# Patient Record
Sex: Male | Born: 1940 | Race: White | Hispanic: No | Marital: Married | Smoking: Former smoker
Health system: Southern US, Community
[De-identification: ages and names within clinical notes are randomized; demographics above are authoritative.]

## PROBLEM LIST (undated history)

## (undated) DIAGNOSIS — M62830 Muscle spasm of back: Secondary | ICD-10-CM

## (undated) DIAGNOSIS — Z942 Lung transplant status: Secondary | ICD-10-CM

## (undated) DIAGNOSIS — N186 End stage renal disease: Secondary | ICD-10-CM

## (undated) DIAGNOSIS — K5792 Diverticulitis of intestine, part unspecified, without perforation or abscess without bleeding: Secondary | ICD-10-CM

## (undated) DIAGNOSIS — C44300 Unspecified malignant neoplasm of skin of unspecified part of face: Secondary | ICD-10-CM

## (undated) DIAGNOSIS — I10 Essential (primary) hypertension: Secondary | ICD-10-CM

## (undated) DIAGNOSIS — N189 Chronic kidney disease, unspecified: Secondary | ICD-10-CM

## (undated) DIAGNOSIS — E119 Type 2 diabetes mellitus without complications: Secondary | ICD-10-CM

## (undated) DIAGNOSIS — Z992 Dependence on renal dialysis: Secondary | ICD-10-CM

## (undated) HISTORY — PX: APPENDECTOMY: SHX54

## (undated) HISTORY — PX: CATARACT EXTRACTION: SUR2

---

## 1999-12-06 DIAGNOSIS — Z89421 Acquired absence of other right toe(s): Secondary | ICD-10-CM | POA: Insufficient documentation

## 1999-12-06 DIAGNOSIS — J449 Chronic obstructive pulmonary disease, unspecified: Secondary | ICD-10-CM | POA: Insufficient documentation

## 1999-12-06 DIAGNOSIS — I2510 Atherosclerotic heart disease of native coronary artery without angina pectoris: Secondary | ICD-10-CM | POA: Insufficient documentation

## 1999-12-06 DIAGNOSIS — I5030 Unspecified diastolic (congestive) heart failure: Secondary | ICD-10-CM | POA: Insufficient documentation

## 1999-12-06 DIAGNOSIS — Z992 Dependence on renal dialysis: Secondary | ICD-10-CM | POA: Insufficient documentation

## 1999-12-06 DIAGNOSIS — E1151 Type 2 diabetes mellitus with diabetic peripheral angiopathy without gangrene: Secondary | ICD-10-CM | POA: Insufficient documentation

## 2001-04-16 ENCOUNTER — Ambulatory Visit: Admission: RE | Admit: 2001-04-16 | Discharge: 2001-04-16 | Payer: Self-pay | Admitting: Family Medicine

## 2001-06-15 ENCOUNTER — Ambulatory Visit: Admission: RE | Admit: 2001-06-15 | Discharge: 2001-06-15 | Payer: Self-pay | Admitting: Pulmonary Disease

## 2001-06-24 ENCOUNTER — Emergency Department (HOSPITAL_COMMUNITY): Admission: EM | Admit: 2001-06-24 | Discharge: 2001-06-24 | Payer: Self-pay | Admitting: *Deleted

## 2001-06-24 ENCOUNTER — Encounter: Payer: Self-pay | Admitting: Emergency Medicine

## 2001-10-04 ENCOUNTER — Encounter (HOSPITAL_COMMUNITY): Admission: RE | Admit: 2001-10-04 | Discharge: 2001-12-04 | Payer: Self-pay | Admitting: Pulmonary Disease

## 2001-12-05 ENCOUNTER — Encounter (HOSPITAL_COMMUNITY): Admission: RE | Admit: 2001-12-05 | Discharge: 2002-03-05 | Payer: Self-pay | Admitting: Pulmonary Disease

## 2002-03-06 ENCOUNTER — Encounter (HOSPITAL_COMMUNITY): Admission: RE | Admit: 2002-03-06 | Discharge: 2002-06-04 | Payer: Self-pay | Admitting: Pulmonary Disease

## 2002-06-05 ENCOUNTER — Encounter (HOSPITAL_COMMUNITY): Admission: RE | Admit: 2002-06-05 | Discharge: 2002-09-03 | Payer: Self-pay | Admitting: Pulmonary Disease

## 2002-09-04 ENCOUNTER — Encounter (HOSPITAL_COMMUNITY): Admission: RE | Admit: 2002-09-04 | Discharge: 2002-12-03 | Payer: Self-pay | Admitting: Pulmonary Disease

## 2002-12-05 ENCOUNTER — Encounter (HOSPITAL_COMMUNITY): Admission: RE | Admit: 2002-12-05 | Discharge: 2003-03-05 | Payer: Self-pay | Admitting: Pulmonary Disease

## 2002-12-05 DIAGNOSIS — Z942 Lung transplant status: Secondary | ICD-10-CM

## 2002-12-05 HISTORY — DX: Lung transplant status: Z94.2

## 2002-12-05 HISTORY — PX: LUNG TRANSPLANT, DOUBLE: SHX704

## 2003-03-06 ENCOUNTER — Encounter (HOSPITAL_COMMUNITY): Admission: RE | Admit: 2003-03-06 | Discharge: 2003-06-04 | Payer: Self-pay | Admitting: Pulmonary Disease

## 2003-08-06 ENCOUNTER — Encounter (HOSPITAL_COMMUNITY): Admission: RE | Admit: 2003-08-06 | Discharge: 2003-11-04 | Payer: Self-pay | Admitting: Pulmonary Disease

## 2003-11-05 ENCOUNTER — Encounter (HOSPITAL_COMMUNITY): Admission: RE | Admit: 2003-11-05 | Discharge: 2004-02-03 | Payer: Self-pay | Admitting: Pulmonary Disease

## 2004-03-09 ENCOUNTER — Encounter (HOSPITAL_COMMUNITY): Admission: RE | Admit: 2004-03-09 | Discharge: 2004-06-07 | Payer: Self-pay | Admitting: Pulmonary Disease

## 2004-10-07 ENCOUNTER — Ambulatory Visit: Payer: Self-pay | Admitting: Pulmonary Disease

## 2004-11-16 ENCOUNTER — Ambulatory Visit: Payer: Self-pay | Admitting: Pulmonary Disease

## 2004-12-21 ENCOUNTER — Ambulatory Visit: Payer: Self-pay | Admitting: Pulmonary Disease

## 2005-01-05 ENCOUNTER — Ambulatory Visit: Payer: Self-pay | Admitting: Pulmonary Disease

## 2005-01-24 ENCOUNTER — Ambulatory Visit: Payer: Self-pay | Admitting: Pulmonary Disease

## 2005-02-01 ENCOUNTER — Ambulatory Visit: Payer: Self-pay | Admitting: Internal Medicine

## 2005-02-22 ENCOUNTER — Ambulatory Visit: Payer: Self-pay | Admitting: Internal Medicine

## 2005-02-28 ENCOUNTER — Ambulatory Visit: Payer: Self-pay | Admitting: Pulmonary Disease

## 2005-04-04 ENCOUNTER — Ambulatory Visit: Payer: Self-pay | Admitting: Pulmonary Disease

## 2005-05-06 ENCOUNTER — Ambulatory Visit: Payer: Self-pay | Admitting: Pulmonary Disease

## 2005-05-12 ENCOUNTER — Encounter (HOSPITAL_COMMUNITY): Admission: RE | Admit: 2005-05-12 | Discharge: 2005-08-10 | Payer: Self-pay | Admitting: Pulmonary Disease

## 2005-06-06 ENCOUNTER — Ambulatory Visit: Payer: Self-pay | Admitting: Pulmonary Disease

## 2005-07-25 ENCOUNTER — Ambulatory Visit: Payer: Self-pay | Admitting: Pulmonary Disease

## 2005-07-26 ENCOUNTER — Ambulatory Visit: Payer: Self-pay | Admitting: Family Medicine

## 2005-08-22 ENCOUNTER — Ambulatory Visit: Payer: Self-pay | Admitting: Pulmonary Disease

## 2005-08-29 ENCOUNTER — Ambulatory Visit: Payer: Self-pay | Admitting: Pulmonary Disease

## 2005-10-03 ENCOUNTER — Ambulatory Visit: Payer: Self-pay | Admitting: Pulmonary Disease

## 2005-10-19 ENCOUNTER — Ambulatory Visit: Payer: Self-pay | Admitting: Pulmonary Disease

## 2006-01-23 ENCOUNTER — Ambulatory Visit: Payer: Self-pay | Admitting: Pulmonary Disease

## 2006-01-31 ENCOUNTER — Ambulatory Visit: Payer: Self-pay | Admitting: Pulmonary Disease

## 2006-03-27 ENCOUNTER — Ambulatory Visit: Payer: Self-pay | Admitting: Pulmonary Disease

## 2006-05-31 ENCOUNTER — Ambulatory Visit: Payer: Self-pay | Admitting: Pulmonary Disease

## 2006-06-14 ENCOUNTER — Ambulatory Visit: Payer: Self-pay | Admitting: Pulmonary Disease

## 2006-09-05 ENCOUNTER — Ambulatory Visit: Payer: Self-pay | Admitting: Pulmonary Disease

## 2006-09-12 ENCOUNTER — Ambulatory Visit: Payer: Self-pay | Admitting: Pulmonary Disease

## 2006-09-26 ENCOUNTER — Ambulatory Visit: Payer: Self-pay | Admitting: Pulmonary Disease

## 2006-10-31 ENCOUNTER — Ambulatory Visit: Payer: Self-pay | Admitting: Pulmonary Disease

## 2007-01-09 ENCOUNTER — Ambulatory Visit: Payer: Self-pay | Admitting: Pulmonary Disease

## 2007-02-19 ENCOUNTER — Ambulatory Visit: Payer: Self-pay | Admitting: Pulmonary Disease

## 2007-02-26 ENCOUNTER — Ambulatory Visit: Payer: Self-pay | Admitting: Pulmonary Disease

## 2007-03-12 ENCOUNTER — Ambulatory Visit: Payer: Self-pay | Admitting: Pulmonary Disease

## 2007-03-26 ENCOUNTER — Ambulatory Visit: Payer: Self-pay | Admitting: Pulmonary Disease

## 2007-04-17 ENCOUNTER — Ambulatory Visit: Payer: Self-pay | Admitting: Pulmonary Disease

## 2007-05-28 ENCOUNTER — Ambulatory Visit: Payer: Self-pay | Admitting: Pulmonary Disease

## 2007-07-24 ENCOUNTER — Ambulatory Visit: Payer: Self-pay | Admitting: Pulmonary Disease

## 2007-08-07 ENCOUNTER — Ambulatory Visit: Payer: Self-pay | Admitting: Pulmonary Disease

## 2007-09-24 ENCOUNTER — Ambulatory Visit: Payer: Self-pay | Admitting: Pulmonary Disease

## 2007-10-22 ENCOUNTER — Telehealth (INDEPENDENT_AMBULATORY_CARE_PROVIDER_SITE_OTHER): Payer: Self-pay | Admitting: *Deleted

## 2007-11-12 ENCOUNTER — Ambulatory Visit: Payer: Self-pay | Admitting: Pulmonary Disease

## 2007-12-10 ENCOUNTER — Ambulatory Visit: Payer: Self-pay | Admitting: Pulmonary Disease

## 2007-12-10 DIAGNOSIS — T86819 Unspecified complication of lung transplant: Secondary | ICD-10-CM | POA: Insufficient documentation

## 2008-01-04 DIAGNOSIS — D899 Disorder involving the immune mechanism, unspecified: Secondary | ICD-10-CM | POA: Insufficient documentation

## 2008-01-04 DIAGNOSIS — R0989 Other specified symptoms and signs involving the circulatory and respiratory systems: Secondary | ICD-10-CM

## 2008-01-04 DIAGNOSIS — E119 Type 2 diabetes mellitus without complications: Secondary | ICD-10-CM | POA: Insufficient documentation

## 2008-01-04 DIAGNOSIS — E785 Hyperlipidemia, unspecified: Secondary | ICD-10-CM | POA: Insufficient documentation

## 2008-01-04 DIAGNOSIS — J479 Bronchiectasis, uncomplicated: Secondary | ICD-10-CM | POA: Insufficient documentation

## 2008-01-04 DIAGNOSIS — R0609 Other forms of dyspnea: Secondary | ICD-10-CM | POA: Insufficient documentation

## 2008-01-04 DIAGNOSIS — E1142 Type 2 diabetes mellitus with diabetic polyneuropathy: Secondary | ICD-10-CM | POA: Insufficient documentation

## 2008-02-11 ENCOUNTER — Ambulatory Visit: Payer: Self-pay | Admitting: Pulmonary Disease

## 2008-03-11 ENCOUNTER — Ambulatory Visit: Payer: Self-pay | Admitting: Pulmonary Disease

## 2008-04-21 ENCOUNTER — Ambulatory Visit: Payer: Self-pay | Admitting: Pulmonary Disease

## 2008-04-21 ENCOUNTER — Telehealth (INDEPENDENT_AMBULATORY_CARE_PROVIDER_SITE_OTHER): Payer: Self-pay | Admitting: *Deleted

## 2008-07-07 ENCOUNTER — Ambulatory Visit: Payer: Self-pay | Admitting: Pulmonary Disease

## 2008-08-12 ENCOUNTER — Ambulatory Visit: Payer: Self-pay | Admitting: Pulmonary Disease

## 2008-11-04 ENCOUNTER — Encounter (INDEPENDENT_AMBULATORY_CARE_PROVIDER_SITE_OTHER): Payer: Self-pay | Admitting: *Deleted

## 2008-11-04 ENCOUNTER — Ambulatory Visit: Payer: Self-pay | Admitting: Pulmonary Disease

## 2008-12-08 ENCOUNTER — Ambulatory Visit: Payer: Self-pay | Admitting: Pulmonary Disease

## 2008-12-08 ENCOUNTER — Encounter (INDEPENDENT_AMBULATORY_CARE_PROVIDER_SITE_OTHER): Payer: Self-pay | Admitting: *Deleted

## 2009-02-02 ENCOUNTER — Telehealth (INDEPENDENT_AMBULATORY_CARE_PROVIDER_SITE_OTHER): Payer: Self-pay | Admitting: *Deleted

## 2009-02-02 ENCOUNTER — Ambulatory Visit: Payer: Self-pay | Admitting: Pulmonary Disease

## 2009-02-02 ENCOUNTER — Encounter (INDEPENDENT_AMBULATORY_CARE_PROVIDER_SITE_OTHER): Payer: Self-pay | Admitting: *Deleted

## 2009-02-03 ENCOUNTER — Ambulatory Visit (HOSPITAL_COMMUNITY): Admission: RE | Admit: 2009-02-03 | Discharge: 2009-02-03 | Payer: Self-pay | Admitting: Pulmonary Disease

## 2009-03-02 ENCOUNTER — Encounter (INDEPENDENT_AMBULATORY_CARE_PROVIDER_SITE_OTHER): Payer: Self-pay | Admitting: *Deleted

## 2009-03-02 ENCOUNTER — Ambulatory Visit: Payer: Self-pay | Admitting: Pulmonary Disease

## 2009-04-07 ENCOUNTER — Ambulatory Visit: Payer: Self-pay | Admitting: Pulmonary Disease

## 2009-04-07 ENCOUNTER — Encounter (INDEPENDENT_AMBULATORY_CARE_PROVIDER_SITE_OTHER): Payer: Self-pay | Admitting: *Deleted

## 2009-04-23 LAB — CONVERTED CEMR LAB
BUN: 18 mg/dL (ref 6–23)
BUN: 18 mg/dL (ref 6–23)
BUN: 19 mg/dL (ref 6–23)
BUN: 20 mg/dL (ref 6–23)
BUN: 21 mg/dL (ref 6–23)
BUN: 21 mg/dL (ref 6–23)
BUN: 23 mg/dL (ref 6–23)
BUN: 23 mg/dL (ref 6–23)
BUN: 23 mg/dL (ref 6–23)
BUN: 23 mg/dL (ref 6–23)
BUN: 24 mg/dL — ABNORMAL HIGH (ref 6–23)
BUN: 25 mg/dL — ABNORMAL HIGH (ref 6–23)
BUN: 26 mg/dL — ABNORMAL HIGH (ref 6–23)
BUN: 27 mg/dL — ABNORMAL HIGH (ref 6–23)
BUN: 24 mg/dL — ABNORMAL HIGH (ref 6–23)
Basophils Absolute: 0 10*3/uL (ref 0.0–0.1)
Basophils Absolute: 0 10*3/uL (ref 0.0–0.1)
Basophils Absolute: 0 10*3/uL (ref 0.0–0.1)
Basophils Absolute: 0 10*3/uL (ref 0.0–0.1)
Basophils Absolute: 0 10*3/uL (ref 0.0–0.1)
Basophils Absolute: 0 10*3/uL (ref 0.0–0.1)
Basophils Absolute: 0 10*3/uL (ref 0.0–0.1)
Basophils Absolute: 0 10*3/uL (ref 0.0–0.1)
Basophils Absolute: 0 10*3/uL (ref 0.0–0.1)
Basophils Absolute: 0.1 10*3/uL (ref 0.0–0.1)
Basophils Relative: 0.2 % (ref 0.0–1.0)
Basophils Relative: 0.3 % (ref 0.0–1.0)
Basophils Relative: 0.4 % (ref 0.0–1.0)
Basophils Relative: 0.5 % (ref 0.0–1.0)
Basophils Relative: 0.5 % (ref 0.0–1.0)
Basophils Relative: 0.5 % (ref 0.0–1.0)
Basophils Relative: 0.7 % (ref 0.0–1.0)
Basophils Relative: 0.8 % (ref 0.0–3.0)
Basophils Relative: 0.9 % (ref 0.0–1.0)
Basophils Relative: 1 % (ref 0.0–1.0)
Basophils Relative: 1.2 % (ref 0.0–3.0)
CO2: 22 meq/L (ref 19–32)
CO2: 22 meq/L (ref 19–32)
CO2: 22 meq/L (ref 19–32)
CO2: 24 meq/L (ref 19–32)
CO2: 24 meq/L (ref 19–32)
CO2: 24 meq/L (ref 19–32)
CO2: 24 meq/L (ref 19–32)
CO2: 24 meq/L (ref 19–32)
CO2: 25 meq/L (ref 19–32)
CO2: 26 meq/L (ref 19–32)
CO2: 26 meq/L (ref 19–32)
CO2: 26 meq/L (ref 19–32)
CO2: 27 meq/L (ref 19–32)
CO2: 27 meq/L (ref 19–32)
CO2: 27 meq/L (ref 19–32)
Calcium: 9 mg/dL (ref 8.4–10.5)
Calcium: 9.1 mg/dL (ref 8.4–10.5)
Calcium: 9.2 mg/dL (ref 8.4–10.5)
Calcium: 9.2 mg/dL (ref 8.4–10.5)
Calcium: 9.2 mg/dL (ref 8.4–10.5)
Calcium: 9.2 mg/dL (ref 8.4–10.5)
Calcium: 9.3 mg/dL (ref 8.4–10.5)
Calcium: 9.3 mg/dL (ref 8.4–10.5)
Calcium: 9.3 mg/dL (ref 8.4–10.5)
Calcium: 9.4 mg/dL (ref 8.4–10.5)
Calcium: 9.5 mg/dL (ref 8.4–10.5)
Calcium: 9.5 mg/dL (ref 8.4–10.5)
Calcium: 9.6 mg/dL (ref 8.4–10.5)
Calcium: 9.7 mg/dL (ref 8.4–10.5)
Chloride: 108 meq/L (ref 96–112)
Chloride: 108 meq/L (ref 96–112)
Chloride: 109 meq/L (ref 96–112)
Chloride: 109 meq/L (ref 96–112)
Chloride: 109 meq/L (ref 96–112)
Chloride: 110 meq/L (ref 96–112)
Chloride: 111 meq/L (ref 96–112)
Chloride: 111 meq/L (ref 96–112)
Chloride: 112 meq/L (ref 96–112)
Chloride: 113 meq/L — ABNORMAL HIGH (ref 96–112)
Chloride: 114 meq/L — ABNORMAL HIGH (ref 96–112)
Creatinine, Ser: 1.3 mg/dL (ref 0.4–1.5)
Creatinine, Ser: 1.3 mg/dL (ref 0.4–1.5)
Creatinine, Ser: 1.4 mg/dL (ref 0.4–1.5)
Creatinine, Ser: 1.4 mg/dL (ref 0.4–1.5)
Creatinine, Ser: 1.4 mg/dL (ref 0.4–1.5)
Creatinine, Ser: 1.4 mg/dL (ref 0.4–1.5)
Creatinine, Ser: 1.4 mg/dL (ref 0.4–1.5)
Creatinine, Ser: 1.5 mg/dL (ref 0.4–1.5)
Creatinine, Ser: 1.5 mg/dL (ref 0.4–1.5)
Creatinine, Ser: 1.5 mg/dL (ref 0.4–1.5)
Creatinine, Ser: 1.5 mg/dL (ref 0.4–1.5)
Creatinine, Ser: 1.5 mg/dL (ref 0.4–1.5)
Eosinophils Absolute: 0.1 10*3/uL (ref 0.0–0.6)
Eosinophils Absolute: 0.1 10*3/uL (ref 0.0–0.6)
Eosinophils Absolute: 0.1 10*3/uL (ref 0.0–0.6)
Eosinophils Absolute: 0.1 10*3/uL (ref 0.0–0.6)
Eosinophils Absolute: 0.1 10*3/uL (ref 0.0–0.6)
Eosinophils Absolute: 0.1 10*3/uL (ref 0.0–0.6)
Eosinophils Absolute: 0.1 10*3/uL (ref 0.0–0.6)
Eosinophils Absolute: 0.1 10*3/uL (ref 0.0–0.7)
Eosinophils Absolute: 0.1 10*3/uL (ref 0.0–0.7)
Eosinophils Absolute: 0.1 10*3/uL (ref 0.0–0.7)
Eosinophils Absolute: 0.2 10*3/uL (ref 0.0–0.6)
Eosinophils Absolute: 0.2 10*3/uL (ref 0.0–0.6)
Eosinophils Absolute: 0.1 10*3/uL (ref 0.0–0.7)
Eosinophils Relative: 0.7 % (ref 0.0–5.0)
Eosinophils Relative: 2.6 % (ref 0.0–5.0)
Eosinophils Relative: 3.6 % (ref 0.0–5.0)
Eosinophils Relative: 3.7 % (ref 0.0–5.0)
Eosinophils Relative: 3.9 % (ref 0.0–5.0)
Eosinophils Relative: 4 % (ref 0.0–5.0)
Eosinophils Relative: 4.3 % (ref 0.0–5.0)
Eosinophils Relative: 4.3 % (ref 0.0–5.0)
Eosinophils Relative: 4.8 % (ref 0.0–5.0)
Eosinophils Relative: 4.9 % (ref 0.0–5.0)
Eosinophils Relative: 5 % (ref 0.0–5.0)
Eosinophils Relative: 4 % (ref 0.0–5.0)
GFR calc Af Amer: 46 mL/min
GFR calc Af Amer: 61 mL/min
GFR calc Af Amer: 61 mL/min
GFR calc Af Amer: 61 mL/min
GFR calc Af Amer: 61 mL/min
GFR calc Af Amer: 61 mL/min
GFR calc Af Amer: 66 mL/min
GFR calc Af Amer: 66 mL/min
GFR calc Af Amer: 66 mL/min
GFR calc Af Amer: 66 mL/min
GFR calc Af Amer: 72 mL/min
GFR calc Af Amer: 60 mL/min
GFR calc non Af Amer: 46 mL/min
GFR calc non Af Amer: 49.79 mL/min (ref >60)
GFR calc non Af Amer: 50 mL/min
GFR calc non Af Amer: 50 mL/min
GFR calc non Af Amer: 50 mL/min
GFR calc non Af Amer: 50 mL/min
GFR calc non Af Amer: 50 mL/min
GFR calc non Af Amer: 54 mL/min
GFR calc non Af Amer: 54 mL/min
GFR calc non Af Amer: 54 mL/min
GFR calc non Af Amer: 54 mL/min
GFR calc non Af Amer: 54 mL/min
GFR calc non Af Amer: 59 mL/min
GFR calc non Af Amer: 50 mL/min
Glomerular Filtration Rate, Af Am: 66 mL/min/{1.73_m2}
Glomerular Filtration Rate, Af Am: 66 mL/min/{1.73_m2}
Glucose, Bld: 104 mg/dL — ABNORMAL HIGH (ref 70–99)
Glucose, Bld: 105 mg/dL — ABNORMAL HIGH (ref 70–99)
Glucose, Bld: 106 mg/dL — ABNORMAL HIGH (ref 70–99)
Glucose, Bld: 113 mg/dL — ABNORMAL HIGH (ref 70–99)
Glucose, Bld: 115 mg/dL — ABNORMAL HIGH (ref 70–99)
Glucose, Bld: 116 mg/dL — ABNORMAL HIGH (ref 70–99)
Glucose, Bld: 129 mg/dL — ABNORMAL HIGH (ref 70–99)
Glucose, Bld: 139 mg/dL — ABNORMAL HIGH (ref 70–99)
Glucose, Bld: 143 mg/dL — ABNORMAL HIGH (ref 70–99)
Glucose, Bld: 144 mg/dL — ABNORMAL HIGH (ref 70–99)
Glucose, Bld: 177 mg/dL — ABNORMAL HIGH (ref 70–99)
Glucose, Bld: 66 mg/dL — ABNORMAL LOW (ref 70–99)
Glucose, Bld: 91 mg/dL (ref 70–99)
Glucose, Bld: 96 mg/dL (ref 70–99)
Glucose, Bld: 99 mg/dL (ref 70–99)
HCT: 27.7 % — ABNORMAL LOW (ref 39.0–52.0)
HCT: 28.8 % — ABNORMAL LOW (ref 39.0–52.0)
HCT: 36.5 % — ABNORMAL LOW (ref 39.0–52.0)
HCT: 36.6 % — ABNORMAL LOW (ref 39.0–52.0)
HCT: 36.9 % — ABNORMAL LOW (ref 39.0–52.0)
HCT: 37.2 % — ABNORMAL LOW (ref 39.0–52.0)
HCT: 37.6 % — ABNORMAL LOW (ref 39.0–52.0)
HCT: 37.6 % — ABNORMAL LOW (ref 39.0–52.0)
HCT: 38.5 % — ABNORMAL LOW (ref 39.0–52.0)
HCT: 39 % (ref 39.0–52.0)
HCT: 39.3 % (ref 39.0–52.0)
HCT: 40.7 % (ref 39.0–52.0)
HCT: 32.5 % — ABNORMAL LOW (ref 39.0–52.0)
Hemoglobin: 11.1 g/dL — ABNORMAL LOW (ref 13.0–17.0)
Hemoglobin: 12 g/dL — ABNORMAL LOW (ref 13.0–17.0)
Hemoglobin: 12.1 g/dL — ABNORMAL LOW (ref 13.0–17.0)
Hemoglobin: 12.7 g/dL — ABNORMAL LOW (ref 13.0–17.0)
Hemoglobin: 12.8 g/dL — ABNORMAL LOW (ref 13.0–17.0)
Hemoglobin: 12.9 g/dL — ABNORMAL LOW (ref 13.0–17.0)
Hemoglobin: 13.2 g/dL (ref 13.0–17.0)
Hemoglobin: 13.2 g/dL (ref 13.0–17.0)
Hemoglobin: 13.5 g/dL (ref 13.0–17.0)
Hemoglobin: 13.5 g/dL (ref 13.0–17.0)
Hemoglobin: 13.6 g/dL (ref 13.0–17.0)
Hemoglobin: 9.1 g/dL — ABNORMAL LOW (ref 13.0–17.0)
Hemoglobin: 9.8 g/dL — ABNORMAL LOW (ref 13.0–17.0)
Lymphocytes Relative: 15.8 % (ref 12.0–46.0)
Lymphocytes Relative: 21.7 % (ref 12.0–46.0)
Lymphocytes Relative: 24.8 % (ref 12.0–46.0)
Lymphocytes Relative: 25.7 % (ref 12.0–46.0)
Lymphocytes Relative: 27.2 % (ref 12.0–46.0)
Lymphocytes Relative: 30.2 % (ref 12.0–46.0)
Lymphocytes Relative: 31.3 % (ref 12.0–46.0)
Lymphocytes Relative: 32.3 % (ref 12.0–46.0)
Lymphocytes Relative: 33.5 % (ref 12.0–46.0)
Lymphocytes Relative: 34 % (ref 12.0–46.0)
Lymphocytes Relative: 34.4 % (ref 12.0–46.0)
Lymphocytes Relative: 34.8 % (ref 12.0–46.0)
Lymphocytes Relative: 35.7 % (ref 12.0–46.0)
Lymphocytes Relative: 35.9 % (ref 12.0–46.0)
Lymphs Abs: 1.1 10*3/uL (ref 0.7–4.0)
MCHC: 32 g/dL (ref 30.0–36.0)
MCHC: 33 g/dL (ref 30.0–36.0)
MCHC: 33.5 g/dL (ref 30.0–36.0)
MCHC: 33.6 g/dL (ref 30.0–36.0)
MCHC: 33.7 g/dL (ref 30.0–36.0)
MCHC: 34.1 g/dL (ref 30.0–36.0)
MCHC: 34.4 g/dL (ref 30.0–36.0)
MCHC: 34.5 g/dL (ref 30.0–36.0)
MCHC: 34.6 g/dL (ref 30.0–36.0)
MCHC: 34.7 g/dL (ref 30.0–36.0)
MCHC: 34.7 g/dL (ref 30.0–36.0)
MCHC: 35.1 g/dL (ref 30.0–36.0)
MCHC: 35.6 g/dL (ref 30.0–36.0)
MCHC: 35.6 g/dL (ref 30.0–36.0)
MCV: 91.1 fL (ref 78.0–100.0)
MCV: 91.4 fL (ref 78.0–100.0)
MCV: 92.7 fL (ref 78.0–100.0)
MCV: 93.2 fL (ref 78.0–100.0)
MCV: 95.6 fL (ref 78.0–100.0)
MCV: 95.6 fL (ref 78.0–100.0)
MCV: 95.8 fL (ref 78.0–100.0)
MCV: 95.9 fL (ref 78.0–100.0)
MCV: 96.1 fL (ref 78.0–100.0)
MCV: 96.3 fL (ref 78.0–100.0)
MCV: 96.6 fL (ref 78.0–100.0)
MCV: 96.8 fL (ref 78.0–100.0)
MCV: 94.1 fL (ref 78.0–100.0)
Magnesium: 1.7 mg/dL (ref 1.5–2.5)
Magnesium: 1.8 mg/dL (ref 1.5–2.5)
Magnesium: 1.8 mg/dL (ref 1.5–2.5)
Magnesium: 1.8 mg/dL (ref 1.5–2.5)
Magnesium: 1.8 mg/dL (ref 1.5–2.5)
Magnesium: 1.8 mg/dL (ref 1.5–2.5)
Magnesium: 1.8 mg/dL (ref 1.5–2.5)
Magnesium: 1.8 mg/dL (ref 1.5–2.5)
Magnesium: 1.9 mg/dL (ref 1.5–2.5)
Magnesium: 1.9 mg/dL (ref 1.5–2.5)
Magnesium: 1.9 mg/dL (ref 1.5–2.5)
Magnesium: 2 mg/dL (ref 1.5–2.5)
Monocytes Absolute: 0.1 10*3/uL — ABNORMAL LOW (ref 0.2–0.7)
Monocytes Absolute: 0.2 10*3/uL (ref 0.2–0.7)
Monocytes Absolute: 0.3 10*3/uL (ref 0.2–0.7)
Monocytes Absolute: 0.3 10*3/uL (ref 0.2–0.7)
Monocytes Absolute: 0.4 10*3/uL (ref 0.1–1.0)
Monocytes Absolute: 0.4 10*3/uL (ref 0.2–0.7)
Monocytes Absolute: 0.6 10*3/uL (ref 0.2–0.7)
Monocytes Absolute: 0.6 10*3/uL (ref 0.2–0.7)
Monocytes Absolute: 0.9 10*3/uL (ref 0.1–1.0)
Monocytes Absolute: 1 10*3/uL (ref 0.1–1.0)
Monocytes Absolute: 0.4 10*3/uL (ref 0.1–1.0)
Monocytes Relative: 11 % (ref 3.0–11.0)
Monocytes Relative: 12.9 % — ABNORMAL HIGH (ref 3.0–12.0)
Monocytes Relative: 15 % — ABNORMAL HIGH (ref 3.0–11.0)
Monocytes Relative: 15.7 % — ABNORMAL HIGH (ref 3.0–11.0)
Monocytes Relative: 16.7 % — ABNORMAL HIGH (ref 3.0–12.0)
Monocytes Relative: 17.8 % — ABNORMAL HIGH (ref 3.0–11.0)
Monocytes Relative: 18.3 % — ABNORMAL HIGH (ref 3.0–12.0)
Monocytes Relative: 19.4 % — ABNORMAL HIGH (ref 3.0–11.0)
Monocytes Relative: 3.1 % (ref 3.0–11.0)
Monocytes Relative: 5.7 % (ref 3.0–11.0)
Monocytes Relative: 7.7 % (ref 3.0–11.0)
Neutro Abs: 1 10*3/uL — ABNORMAL LOW (ref 1.4–7.7)
Neutro Abs: 1 10*3/uL — ABNORMAL LOW (ref 1.4–7.7)
Neutro Abs: 1.1 10*3/uL — ABNORMAL LOW (ref 1.4–7.7)
Neutro Abs: 1.2 10*3/uL — ABNORMAL LOW (ref 1.4–7.7)
Neutro Abs: 1.3 10*3/uL — ABNORMAL LOW (ref 1.4–7.7)
Neutro Abs: 1.3 10*3/uL — ABNORMAL LOW (ref 1.4–7.7)
Neutro Abs: 1.4 10*3/uL (ref 1.4–7.7)
Neutro Abs: 1.7 10*3/uL (ref 1.4–7.7)
Neutro Abs: 2 10*3/uL (ref 1.4–7.7)
Neutro Abs: 2.2 10*3/uL (ref 1.4–7.7)
Neutro Abs: 2.3 10*3/uL (ref 1.4–7.7)
Neutro Abs: 3.2 10*3/uL (ref 1.4–7.7)
Neutro Abs: 5.3 10*3/uL (ref 1.4–7.7)
Neutrophils Relative %: 41.8 % — ABNORMAL LOW (ref 43.0–77.0)
Neutrophils Relative %: 42.6 % — ABNORMAL LOW (ref 43.0–77.0)
Neutrophils Relative %: 42.9 % — ABNORMAL LOW (ref 43.0–77.0)
Neutrophils Relative %: 43.1 % (ref 43.0–77.0)
Neutrophils Relative %: 44.9 % (ref 43.0–77.0)
Neutrophils Relative %: 47.3 % (ref 43.0–77.0)
Neutrophils Relative %: 50.1 % (ref 43.0–77.0)
Neutrophils Relative %: 51.8 % (ref 43.0–77.0)
Neutrophils Relative %: 52.6 % (ref 43.0–77.0)
Neutrophils Relative %: 56.4 % (ref 43.0–77.0)
Neutrophils Relative %: 61.2 % (ref 43.0–77.0)
Neutrophils Relative %: 72.1 % (ref 43.0–77.0)
Platelets: 132 10*3/uL — ABNORMAL LOW (ref 150–400)
Platelets: 138 10*3/uL — ABNORMAL LOW (ref 150–400)
Platelets: 144 10*3/uL — ABNORMAL LOW (ref 150–400)
Platelets: 148 10*3/uL — ABNORMAL LOW (ref 150–400)
Platelets: 154 10*3/uL (ref 150–400)
Platelets: 166 10*3/uL (ref 150–400)
Platelets: 174 10*3/uL (ref 150–400)
Platelets: 176 10*3/uL (ref 150–400)
Platelets: 181 10*3/uL (ref 150–400)
Platelets: 183 10*3/uL (ref 150–400)
Platelets: 185 10*3/uL (ref 150–400)
Platelets: 192 10*3/uL (ref 150–400)
Platelets: 219 10*3/uL (ref 150–400)
Platelets: 131 10*3/uL — ABNORMAL LOW (ref 150–400)
Potassium: 3.4 meq/L — ABNORMAL LOW (ref 3.5–5.1)
Potassium: 3.6 meq/L (ref 3.5–5.1)
Potassium: 3.8 meq/L (ref 3.5–5.1)
Potassium: 3.8 meq/L (ref 3.5–5.1)
Potassium: 4 meq/L (ref 3.5–5.1)
Potassium: 4 meq/L (ref 3.5–5.1)
Potassium: 4 meq/L (ref 3.5–5.1)
Potassium: 4 meq/L (ref 3.5–5.1)
Potassium: 4.2 meq/L (ref 3.5–5.1)
Potassium: 4.2 meq/L (ref 3.5–5.1)
Potassium: 4.3 meq/L (ref 3.5–5.1)
Potassium: 4.7 meq/L (ref 3.5–5.1)
Potassium: 5.2 meq/L — ABNORMAL HIGH (ref 3.5–5.1)
Potassium: 4 meq/L (ref 3.5–5.1)
RBC: 3.15 M/uL — ABNORMAL LOW (ref 4.22–5.81)
RBC: 3.35 M/uL — ABNORMAL LOW (ref 4.22–5.81)
RBC: 3.4 M/uL — ABNORMAL LOW (ref 4.22–5.81)
RBC: 3.86 M/uL — ABNORMAL LOW (ref 4.22–5.81)
RBC: 3.91 M/uL — ABNORMAL LOW (ref 4.22–5.81)
RBC: 3.94 M/uL — ABNORMAL LOW (ref 4.22–5.81)
RBC: 3.99 M/uL — ABNORMAL LOW (ref 4.22–5.81)
RBC: 3.99 M/uL — ABNORMAL LOW (ref 4.22–5.81)
RBC: 4.01 M/uL — ABNORMAL LOW (ref 4.22–5.81)
RBC: 4.02 M/uL — ABNORMAL LOW (ref 4.22–5.81)
RBC: 4.03 M/uL — ABNORMAL LOW (ref 4.22–5.81)
RBC: 4.05 M/uL — ABNORMAL LOW (ref 4.22–5.81)
RBC: 4.07 M/uL — ABNORMAL LOW (ref 4.22–5.81)
RBC: 4.21 M/uL — ABNORMAL LOW (ref 4.22–5.81)
RDW: 12.4 % (ref 11.5–14.6)
RDW: 12.5 % (ref 11.5–14.6)
RDW: 12.5 % (ref 11.5–14.6)
RDW: 12.5 % (ref 11.5–14.6)
RDW: 12.8 % (ref 11.5–14.6)
RDW: 12.8 % (ref 11.5–14.6)
RDW: 13 % (ref 11.5–14.6)
RDW: 13 % (ref 11.5–14.6)
RDW: 13.2 % (ref 11.5–14.6)
RDW: 13.6 % (ref 11.5–14.6)
RDW: 14.4 % (ref 11.5–14.6)
Sodium: 141 meq/L (ref 135–145)
Sodium: 141 meq/L (ref 135–145)
Sodium: 141 meq/L (ref 135–145)
Sodium: 141 meq/L (ref 135–145)
Sodium: 141 meq/L (ref 135–145)
Sodium: 141 meq/L (ref 135–145)
Sodium: 142 meq/L (ref 135–145)
Sodium: 142 meq/L (ref 135–145)
Sodium: 142 meq/L (ref 135–145)
Sodium: 142 meq/L (ref 135–145)
Sodium: 143 meq/L (ref 135–145)
Sodium: 144 meq/L (ref 135–145)
WBC: 2.2 10*3/uL — ABNORMAL LOW (ref 4.5–10.5)
WBC: 2.5 10*3/uL — ABNORMAL LOW (ref 4.5–10.5)
WBC: 2.5 10*3/uL — ABNORMAL LOW (ref 4.5–10.5)
WBC: 2.6 10*3/uL — ABNORMAL LOW (ref 4.5–10.5)
WBC: 2.8 10*3/uL — ABNORMAL LOW (ref 4.5–10.5)
WBC: 3 10*3/uL — ABNORMAL LOW (ref 4.5–10.5)
WBC: 3.1 10*3/uL — ABNORMAL LOW (ref 4.5–10.5)
WBC: 3.1 10*3/uL — ABNORMAL LOW (ref 4.5–10.5)
WBC: 3.7 10*3/uL — ABNORMAL LOW (ref 4.5–10.5)
WBC: 4.1 10*3/uL — ABNORMAL LOW (ref 4.5–10.5)
WBC: 4.3 10*3/uL — ABNORMAL LOW (ref 4.5–10.5)
WBC: 4.3 10*3/uL — ABNORMAL LOW (ref 4.5–10.5)
WBC: 7.6 10*3/uL (ref 4.5–10.5)
WBC: 2.6 10*3/uL — ABNORMAL LOW (ref 4.5–10.5)

## 2009-05-19 ENCOUNTER — Ambulatory Visit: Payer: Self-pay | Admitting: Pulmonary Disease

## 2009-06-22 LAB — CONVERTED CEMR LAB
Basophils Relative: 0.9 % (ref 0.0–3.0)
CO2: 27 meq/L (ref 19–32)
Chloride: 116 meq/L — ABNORMAL HIGH (ref 96–112)
Creatinine, Ser: 1.8 mg/dL — ABNORMAL HIGH (ref 0.4–1.5)
Eosinophils Absolute: 0.2 10*3/uL (ref 0.0–0.7)
Eosinophils Relative: 2.5 % (ref 0.0–5.0)
Hemoglobin: 11.8 g/dL — ABNORMAL LOW (ref 13.0–17.0)
Lymphocytes Relative: 15.3 % (ref 12.0–46.0)
MCHC: 34.4 g/dL (ref 30.0–36.0)
MCV: 87.2 fL (ref 78.0–100.0)
Monocytes Absolute: 0.8 10*3/uL (ref 0.1–1.0)
Neutro Abs: 4.2 10*3/uL (ref 1.4–7.7)
Neutrophils Relative %: 68.6 % (ref 43.0–77.0)
RBC: 3.94 M/uL — ABNORMAL LOW (ref 4.22–5.81)
Sodium: 144 meq/L (ref 135–145)
WBC: 6.3 10*3/uL (ref 4.5–10.5)

## 2009-06-30 ENCOUNTER — Ambulatory Visit: Payer: Self-pay | Admitting: Pulmonary Disease

## 2009-06-30 LAB — CONVERTED CEMR LAB
BUN: 24 mg/dL — ABNORMAL HIGH (ref 6–23)
Calcium: 8.9 mg/dL (ref 8.4–10.5)
Creatinine, Ser: 1.7 mg/dL — ABNORMAL HIGH (ref 0.4–1.5)
GFR calc non Af Amer: 43.08 mL/min (ref >60)
Magnesium: 1.9 mg/dL (ref 1.5–2.5)
Platelets: 107 10*3/uL — ABNORMAL LOW (ref 150.0–400.0)
RBC: 3.77 M/uL — ABNORMAL LOW (ref 4.22–5.81)
WBC: 5.1 10*3/uL (ref 4.5–10.5)

## 2009-07-01 LAB — CONVERTED CEMR LAB
BUN: 32 mg/dL — ABNORMAL HIGH (ref 6–23)
Basophils Absolute: 0 10*3/uL (ref 0.0–0.1)
CO2: 25 meq/L (ref 19–32)
Eosinophils Absolute: 0 10*3/uL (ref 0.0–0.7)
Glucose, Bld: 151 mg/dL — ABNORMAL HIGH (ref 70–99)
HCT: 33.3 % — ABNORMAL LOW (ref 39.0–52.0)
Hemoglobin: 11.2 g/dL — ABNORMAL LOW (ref 13.0–17.0)
Lymphs Abs: 0.7 10*3/uL (ref 0.7–4.0)
MCHC: 33.7 g/dL (ref 30.0–36.0)
MCV: 87.4 fL (ref 78.0–100.0)
Monocytes Absolute: 0.1 10*3/uL (ref 0.1–1.0)
Neutro Abs: 7.1 10*3/uL (ref 1.4–7.7)
Platelets: 172 10*3/uL (ref 150.0–400.0)
Potassium: 3.8 meq/L (ref 3.5–5.1)
RDW: 16.4 % — ABNORMAL HIGH (ref 11.5–14.6)
Sodium: 141 meq/L (ref 135–145)

## 2009-08-17 ENCOUNTER — Ambulatory Visit: Payer: Self-pay | Admitting: Pulmonary Disease

## 2009-08-17 LAB — CONVERTED CEMR LAB
Chloride: 109 meq/L (ref 96–112)
Eosinophils Relative: 2.9 % (ref 0.0–5.0)
GFR calc non Af Amer: 43.04 mL/min (ref >60)
HCT: 33.5 % — ABNORMAL LOW (ref 39.0–52.0)
Hemoglobin: 11.3 g/dL — ABNORMAL LOW (ref 13.0–17.0)
Lymphocytes Relative: 16.2 % (ref 12.0–46.0)
Lymphs Abs: 1.1 10*3/uL (ref 0.7–4.0)
Magnesium: 1.8 mg/dL (ref 1.5–2.5)
Monocytes Relative: 11.5 % (ref 3.0–12.0)
Neutro Abs: 4.4 10*3/uL (ref 1.4–7.7)
Potassium: 4.4 meq/L (ref 3.5–5.1)
RBC: 3.76 M/uL — ABNORMAL LOW (ref 4.22–5.81)
Sodium: 141 meq/L (ref 135–145)
WBC: 6.6 10*3/uL (ref 4.5–10.5)

## 2009-08-18 ENCOUNTER — Encounter (INDEPENDENT_AMBULATORY_CARE_PROVIDER_SITE_OTHER): Payer: Self-pay | Admitting: *Deleted

## 2009-10-26 ENCOUNTER — Ambulatory Visit: Payer: Self-pay | Admitting: Pulmonary Disease

## 2009-10-26 LAB — CONVERTED CEMR LAB
BUN: 29 mg/dL — ABNORMAL HIGH
Basophils Absolute: 0.1 K/uL
Basophils Relative: 1 %
CO2: 24 meq/L
Calcium: 8.7 mg/dL
Chloride: 110 meq/L
Creatinine, Ser: 2 mg/dL — ABNORMAL HIGH
Eosinophils Absolute: 0.1 K/uL
Eosinophils Relative: 2.5 %
GFR calc non Af Amer: 35.66 mL/min
Glucose, Bld: 134 mg/dL — ABNORMAL HIGH
HCT: 33.2 % — ABNORMAL LOW
Hemoglobin: 11 g/dL — ABNORMAL LOW
Lymphocytes Relative: 19.3 %
Lymphs Abs: 1.1 K/uL
MCHC: 33.2 g/dL
MCV: 87.3 fL
Magnesium: 2 mg/dL
Monocytes Absolute: 0.9 K/uL
Monocytes Relative: 15.6 % — ABNORMAL HIGH
Neutro Abs: 3.3 K/uL
Neutrophils Relative %: 61.6 %
Platelets: 197 K/uL
Potassium: 4.2 meq/L
RBC: 3.8 M/uL — ABNORMAL LOW
RDW: 16.1 % — ABNORMAL HIGH
Sodium: 142 meq/L
WBC: 5.5 10*3/microliter

## 2010-01-11 ENCOUNTER — Ambulatory Visit: Payer: Self-pay | Admitting: Pulmonary Disease

## 2010-02-02 ENCOUNTER — Encounter (INDEPENDENT_AMBULATORY_CARE_PROVIDER_SITE_OTHER): Payer: Self-pay | Admitting: *Deleted

## 2010-02-11 LAB — CONVERTED CEMR LAB
BUN: 25 mg/dL — ABNORMAL HIGH (ref 6–23)
Basophils Absolute: 0 10*3/uL (ref 0.0–0.1)
Calcium: 9.1 mg/dL (ref 8.4–10.5)
Eosinophils Absolute: 0.1 10*3/uL (ref 0.0–0.7)
GFR calc non Af Amer: 35.63 mL/min (ref >60)
Glucose, Bld: 113 mg/dL — ABNORMAL HIGH (ref 70–99)
Hemoglobin: 10.8 g/dL — ABNORMAL LOW (ref 13.0–17.0)
Lymphocytes Relative: 21.9 % (ref 12.0–46.0)
MCHC: 33.3 g/dL (ref 30.0–36.0)
Magnesium: 2.1 mg/dL (ref 1.5–2.5)
Monocytes Relative: 13.7 % — ABNORMAL HIGH (ref 3.0–12.0)
Neutro Abs: 2.9 10*3/uL (ref 1.4–7.7)
Neutrophils Relative %: 61.4 % (ref 43.0–77.0)
Platelets: 165 10*3/uL (ref 150.0–400.0)
Potassium: 4.2 meq/L (ref 3.5–5.1)
RDW: 16.1 % — ABNORMAL HIGH (ref 11.5–14.6)

## 2010-02-15 ENCOUNTER — Ambulatory Visit: Payer: Self-pay | Admitting: Pulmonary Disease

## 2010-02-16 LAB — CONVERTED CEMR LAB
CO2: 19 meq/L (ref 19–32)
Chloride: 116 meq/L — ABNORMAL HIGH (ref 96–112)
Creatinine, Ser: 2.2 mg/dL — ABNORMAL HIGH (ref 0.4–1.5)
Eosinophils Relative: 3 % (ref 0–5)
HCT: 33.1 % — ABNORMAL LOW (ref 39.0–52.0)
Lymphocytes Relative: 28 % (ref 12–46)
Lymphs Abs: 1.5 10*3/uL (ref 0.7–4.0)
MCV: 89 fL (ref 78.0–100.0)
Magnesium: 2.3 mg/dL (ref 1.5–2.5)
Monocytes Relative: 18 % — ABNORMAL HIGH (ref 3–12)
Platelets: 223 10*3/uL (ref 150–400)
Potassium: 4.6 meq/L (ref 3.5–5.1)
RBC: 3.72 M/uL — ABNORMAL LOW (ref 4.22–5.81)
Sodium: 144 meq/L (ref 135–145)
WBC: 5.3 10*3/uL (ref 4.0–10.5)

## 2010-03-23 ENCOUNTER — Ambulatory Visit: Payer: Self-pay | Admitting: Pulmonary Disease

## 2010-04-12 LAB — CONVERTED CEMR LAB
BUN: 36 mg/dL — ABNORMAL HIGH (ref 6–23)
Basophils Relative: 0.5 % (ref 0.0–3.0)
CO2: 21 meq/L (ref 19–32)
Chloride: 114 meq/L — ABNORMAL HIGH (ref 96–112)
Eosinophils Relative: 2 % (ref 0.0–5.0)
HCT: 30.6 % — ABNORMAL LOW (ref 39.0–52.0)
Lymphs Abs: 1.3 10*3/uL (ref 0.7–4.0)
MCHC: 33.9 g/dL (ref 30.0–36.0)
MCV: 89.8 fL (ref 78.0–100.0)
Monocytes Absolute: 0.9 10*3/uL (ref 0.1–1.0)
Potassium: 4.7 meq/L (ref 3.5–5.1)
RBC: 3.4 M/uL — ABNORMAL LOW (ref 4.22–5.81)
WBC: 5.4 10*3/uL (ref 4.5–10.5)

## 2010-05-10 ENCOUNTER — Ambulatory Visit: Payer: Self-pay | Admitting: Pulmonary Disease

## 2010-05-24 ENCOUNTER — Ambulatory Visit: Payer: Self-pay | Admitting: Pulmonary Disease

## 2010-05-25 LAB — CONVERTED CEMR LAB
BUN: 35 mg/dL — ABNORMAL HIGH (ref 6–23)
BUN: 35 mg/dL — ABNORMAL HIGH (ref 6–23)
Basophils Absolute: 0 10*3/uL (ref 0.0–0.1)
CO2: 21 meq/L (ref 19–32)
Calcium: 9.1 mg/dL (ref 8.4–10.5)
Creatinine, Ser: 2.4 mg/dL — ABNORMAL HIGH (ref 0.4–1.5)
Eosinophils Absolute: 0.2 10*3/uL (ref 0.0–0.7)
Eosinophils Relative: 2.7 % (ref 0.0–5.0)
GFR calc non Af Amer: 31.39 mL/min (ref >60)
Glucose, Bld: 112 mg/dL — ABNORMAL HIGH (ref 70–99)
HCT: 27.8 % — ABNORMAL LOW (ref 39.0–52.0)
Hemoglobin: 11.1 g/dL — ABNORMAL LOW (ref 13.0–17.0)
Lymphocytes Relative: 14.3 % (ref 12.0–46.0)
Lymphs Abs: 1.3 10*3/uL (ref 0.7–4.0)
MCHC: 34.3 g/dL (ref 30.0–36.0)
Magnesium: 2.3 mg/dL (ref 1.5–2.5)
Monocytes Relative: 12.7 % — ABNORMAL HIGH (ref 3.0–12.0)
Neutro Abs: 6.4 10*3/uL (ref 1.4–7.7)
Neutrophils Relative %: 73.4 % (ref 43.0–77.0)
Neutrophils Relative %: 67.2 % (ref 43.0–77.0)
Platelets: 199 10*3/uL (ref 150.0–400.0)
Platelets: 244 10*3/uL (ref 150.0–400.0)
Potassium: 4.7 meq/L (ref 3.5–5.1)
RDW: 15.5 % — ABNORMAL HIGH (ref 11.5–14.6)
WBC: 7.7 10*3/uL (ref 4.5–10.5)

## 2010-06-14 ENCOUNTER — Ambulatory Visit: Payer: Self-pay | Admitting: Pulmonary Disease

## 2010-06-14 ENCOUNTER — Telehealth (INDEPENDENT_AMBULATORY_CARE_PROVIDER_SITE_OTHER): Payer: Self-pay | Admitting: *Deleted

## 2010-06-14 LAB — CONVERTED CEMR LAB
Basophils Relative: 0.3 % (ref 0.0–3.0)
CO2: 17 meq/L — ABNORMAL LOW (ref 19–32)
Chloride: 118 meq/L — ABNORMAL HIGH (ref 96–112)
Eosinophils Absolute: 0.2 10*3/uL (ref 0.0–0.7)
HCT: 32.7 % — ABNORMAL LOW (ref 39.0–52.0)
Hemoglobin: 11.2 g/dL — ABNORMAL LOW (ref 13.0–17.0)
MCHC: 34.3 g/dL (ref 30.0–36.0)
MCV: 92.2 fL (ref 78.0–100.0)
Monocytes Absolute: 1.3 10*3/uL — ABNORMAL HIGH (ref 0.1–1.0)
Neutro Abs: 7.3 10*3/uL (ref 1.4–7.7)
Potassium: 5.4 meq/L — ABNORMAL HIGH (ref 3.5–5.1)
RBC: 3.55 M/uL — ABNORMAL LOW (ref 4.22–5.81)
Sodium: 140 meq/L (ref 135–145)

## 2010-07-19 ENCOUNTER — Ambulatory Visit: Payer: Self-pay | Admitting: Pulmonary Disease

## 2010-07-20 LAB — CONVERTED CEMR LAB
BUN: 47 mg/dL — ABNORMAL HIGH (ref 6–23)
Basophils Absolute: 0 10*3/uL (ref 0.0–0.1)
Calcium: 9.1 mg/dL (ref 8.4–10.5)
Eosinophils Absolute: 0.2 10*3/uL (ref 0.0–0.7)
GFR calc non Af Amer: 18.47 mL/min (ref >60)
Glucose, Bld: 92 mg/dL (ref 70–99)
HCT: 28.4 % — ABNORMAL LOW (ref 39.0–52.0)
Lymphs Abs: 1 10*3/uL (ref 0.7–4.0)
MCHC: 34.6 g/dL (ref 30.0–36.0)
Monocytes Relative: 12.5 % — ABNORMAL HIGH (ref 3.0–12.0)
Platelets: 234 10*3/uL (ref 150.0–400.0)
RDW: 15.9 % — ABNORMAL HIGH (ref 11.5–14.6)
Vitamin B-12: 302 pg/mL (ref 211–911)

## 2010-08-02 ENCOUNTER — Ambulatory Visit: Payer: Self-pay | Admitting: Pulmonary Disease

## 2010-09-10 ENCOUNTER — Telehealth: Payer: Self-pay | Admitting: Internal Medicine

## 2010-09-20 ENCOUNTER — Ambulatory Visit: Payer: Self-pay | Admitting: Pulmonary Disease

## 2010-09-22 LAB — CONVERTED CEMR LAB
BUN: 36 mg/dL — ABNORMAL HIGH (ref 6–23)
Basophils Absolute: 0 10*3/uL (ref 0.0–0.1)
Basophils Absolute: 0 10*3/uL (ref 0.0–0.1)
CO2: 14 meq/L — ABNORMAL LOW (ref 19–32)
Calcium: 8.9 mg/dL (ref 8.4–10.5)
Calcium: 8.9 mg/dL (ref 8.4–10.5)
Creatinine, Ser: 2.4 mg/dL — ABNORMAL HIGH (ref 0.4–1.5)
Eosinophils Absolute: 0.2 10*3/uL (ref 0.0–0.7)
Eosinophils Relative: 2.6 % (ref 0.0–5.0)
GFR calc non Af Amer: 33.25 mL/min (ref >60)
Glucose, Bld: 75 mg/dL (ref 70–99)
HCT: 31.3 % — ABNORMAL LOW (ref 39.0–52.0)
Hemoglobin: 10.6 g/dL — ABNORMAL LOW (ref 13.0–17.0)
Lymphocytes Relative: 18.9 % (ref 12.0–46.0)
Lymphocytes Relative: 22.7 % (ref 12.0–46.0)
Lymphs Abs: 1.5 10*3/uL (ref 0.7–4.0)
MCHC: 34.5 g/dL (ref 30.0–36.0)
MCV: 91.8 fL (ref 78.0–100.0)
Magnesium: 1.8 mg/dL (ref 1.5–2.5)
Magnesium: 1.9 mg/dL (ref 1.5–2.5)
Monocytes Absolute: 1 10*3/uL (ref 0.1–1.0)
Monocytes Relative: 10.7 % (ref 3.0–12.0)
Neutro Abs: 5.3 10*3/uL (ref 1.4–7.7)
Neutrophils Relative %: 60.8 % (ref 43.0–77.0)
Platelets: 213 10*3/uL (ref 150.0–400.0)
Potassium: 3.8 meq/L (ref 3.5–5.1)
RBC: 2.98 M/uL — ABNORMAL LOW (ref 4.22–5.81)
RDW: 15.2 % — ABNORMAL HIGH (ref 11.5–14.6)
RDW: 15.5 % — ABNORMAL HIGH (ref 11.5–14.6)
Sodium: 141 meq/L (ref 135–145)
WBC: 7.9 10*3/uL (ref 4.5–10.5)

## 2010-11-08 ENCOUNTER — Ambulatory Visit: Payer: Self-pay | Admitting: Pulmonary Disease

## 2010-11-11 LAB — CONVERTED CEMR LAB
BUN: 43 mg/dL — ABNORMAL HIGH (ref 6–23)
Basophils Relative: 0.5 % (ref 0.0–3.0)
CO2: 15 meq/L — ABNORMAL LOW (ref 19–32)
Chloride: 114 meq/L — ABNORMAL HIGH (ref 96–112)
Creatinine, Ser: 2.4 mg/dL — ABNORMAL HIGH (ref 0.4–1.5)
Eosinophils Absolute: 0.2 10*3/uL (ref 0.0–0.7)
Eosinophils Relative: 2.4 % (ref 0.0–5.0)
HCT: 36.2 % — ABNORMAL LOW (ref 39.0–52.0)
Lymphs Abs: 1.3 10*3/uL (ref 0.7–4.0)
MCHC: 33.6 g/dL (ref 30.0–36.0)
MCV: 90.2 fL (ref 78.0–100.0)
Monocytes Absolute: 0.9 10*3/uL (ref 0.1–1.0)
Neutro Abs: 4.8 10*3/uL (ref 1.4–7.7)
Potassium: 4 meq/L (ref 3.5–5.1)
RBC: 4.01 M/uL — ABNORMAL LOW (ref 4.22–5.81)
WBC: 7.2 10*3/uL (ref 4.5–10.5)

## 2010-12-26 ENCOUNTER — Encounter: Payer: Self-pay | Admitting: Pediatrics

## 2010-12-26 ENCOUNTER — Encounter: Payer: Self-pay | Admitting: Pulmonary Disease

## 2010-12-27 ENCOUNTER — Ambulatory Visit
Admission: RE | Admit: 2010-12-27 | Discharge: 2010-12-27 | Payer: Self-pay | Source: Home / Self Care | Attending: Pulmonary Disease | Admitting: Pulmonary Disease

## 2010-12-27 ENCOUNTER — Other Ambulatory Visit: Payer: Self-pay | Admitting: Pulmonary Disease

## 2010-12-27 ENCOUNTER — Encounter: Payer: Self-pay | Admitting: Pulmonary Disease

## 2010-12-27 LAB — CBC WITH DIFFERENTIAL/PLATELET
Basophils Absolute: 0 10*3/uL (ref 0.0–0.1)
Basophils Relative: 0.3 % (ref 0.0–3.0)
Eosinophils Absolute: 0.2 10*3/uL (ref 0.0–0.7)
Eosinophils Relative: 1.9 % (ref 0.0–5.0)
HCT: 35.5 % — ABNORMAL LOW (ref 39.0–52.0)
Hemoglobin: 12 g/dL — ABNORMAL LOW (ref 13.0–17.0)
Lymphocytes Relative: 13 % (ref 12.0–46.0)
Lymphs Abs: 1.6 10*3/uL (ref 0.7–4.0)
MCHC: 33.7 g/dL (ref 30.0–36.0)
MCV: 88.4 fl (ref 78.0–100.0)
Monocytes Absolute: 1.3 10*3/uL — ABNORMAL HIGH (ref 0.1–1.0)
Monocytes Relative: 10.5 % (ref 3.0–12.0)
Neutro Abs: 9.2 10*3/uL — ABNORMAL HIGH (ref 1.4–7.7)
Neutrophils Relative %: 74.3 % (ref 43.0–77.0)
Platelets: 267 10*3/uL (ref 150.0–400.0)
RBC: 4.02 Mil/uL — ABNORMAL LOW (ref 4.22–5.81)
RDW: 16.4 % — ABNORMAL HIGH (ref 11.5–14.6)
WBC: 12.3 10*3/uL — ABNORMAL HIGH (ref 4.5–10.5)

## 2010-12-27 LAB — BASIC METABOLIC PANEL
BUN: 47 mg/dL — ABNORMAL HIGH (ref 6–23)
CO2: 13 mEq/L — ABNORMAL LOW (ref 19–32)
Calcium: 8.6 mg/dL (ref 8.4–10.5)
Chloride: 115 mEq/L — ABNORMAL HIGH (ref 96–112)
Creatinine, Ser: 3.4 mg/dL — ABNORMAL HIGH (ref 0.4–1.5)
GFR: 19.07 mL/min — ABNORMAL LOW (ref >60.00)
Glucose, Bld: 131 mg/dL — ABNORMAL HIGH (ref 70–99)
Potassium: 4.7 mEq/L (ref 3.5–5.1)
Sodium: 138 mEq/L (ref 135–145)

## 2010-12-27 LAB — MAGNESIUM: Magnesium: 1.8 mg/dL (ref 1.5–2.5)

## 2011-01-04 NOTE — Letter (Signed)
Summary: Office Visit Letter  [facility]  [address] [place], [place] [address]   Phone: [phone]  Fax: [phone]      February 02, 2010 MRN: [id number]   T[address] [place], [place]  [address]   Dear [name],   According to our records, it is time for you to schedule a follow-up office visit with [place].   At your convenience, please call [phone] (option #2)to schedule an office visit. If you have any questions, concerns, or feel that this letter is in error, we would appreciate your call.   Sincerely,  Gatha Mayer, M.D.  [facility] [phone]

## 2011-01-04 NOTE — Progress Notes (Signed)
Summary: labs  Phone Note Call from Patient Call back at Home Phone [phone]   Caller: Patient Call For: simonds Summary of Call: needs orders for labs put in these results get sent to [facility]  Initial call taken by: Don Broach,  June 14, 2010 11:10 AM  Follow-up for Phone Call        Labs signed okayed per DS and faxed via biscom to Dr Rowe Robert at [facility].  Pt aware this has been done. Follow-up by: Tilden Dome,  June 14, 2010 2:06 PM

## 2011-01-04 NOTE — Progress Notes (Signed)
Summary: Schedule Office Visit  Phone Note Outgoing Call Call back at [facility] Phone [phone]   Call placed by: Bernita Buffy CMA [facility] Medina),  September 10, 2010 9:46 AM Call placed to: Patient Summary of Call: patient declined to come in for an office visit. He will call back when he is interested.  Initial call taken by: Bernita Buffy CMA [facility]),  September 10, 2010 9:47 AM

## 2011-01-05 ENCOUNTER — Inpatient Hospital Stay (HOSPITAL_COMMUNITY)
Admission: EM | Admit: 2011-01-05 | Discharge: 2011-01-13 | DRG: 682 | Disposition: A | Discharge status: Other Acute Inpatient Hospital | Payer: Medicare Other | Attending: Internal Medicine | Admitting: Internal Medicine

## 2011-01-05 ENCOUNTER — Emergency Department (HOSPITAL_COMMUNITY): Payer: Medicare Other

## 2011-01-05 DIAGNOSIS — I129 Hypertensive chronic kidney disease with stage 1 through stage 4 chronic kidney disease, or unspecified chronic kidney disease: Secondary | ICD-10-CM | POA: Diagnosis present

## 2011-01-05 DIAGNOSIS — D696 Thrombocytopenia, unspecified: Secondary | ICD-10-CM | POA: Diagnosis present

## 2011-01-05 DIAGNOSIS — E876 Hypokalemia: Secondary | ICD-10-CM | POA: Diagnosis not present

## 2011-01-05 DIAGNOSIS — D62 Acute posthemorrhagic anemia: Secondary | ICD-10-CM | POA: Diagnosis not present

## 2011-01-05 DIAGNOSIS — D689 Coagulation defect, unspecified: Secondary | ICD-10-CM | POA: Diagnosis present

## 2011-01-05 DIAGNOSIS — A088 Other specified intestinal infections: Secondary | ICD-10-CM | POA: Diagnosis present

## 2011-01-05 DIAGNOSIS — R0602 Shortness of breath: Secondary | ICD-10-CM

## 2011-01-05 DIAGNOSIS — N184 Chronic kidney disease, stage 4 (severe): Secondary | ICD-10-CM | POA: Diagnosis present

## 2011-01-05 DIAGNOSIS — I214 Non-ST elevation (NSTEMI) myocardial infarction: Secondary | ICD-10-CM | POA: Diagnosis present

## 2011-01-05 DIAGNOSIS — D631 Anemia in chronic kidney disease: Secondary | ICD-10-CM | POA: Diagnosis present

## 2011-01-05 DIAGNOSIS — Z79899 Other long term (current) drug therapy: Secondary | ICD-10-CM

## 2011-01-05 DIAGNOSIS — Z942 Lung transplant status: Secondary | ICD-10-CM

## 2011-01-05 DIAGNOSIS — N039 Chronic nephritic syndrome with unspecified morphologic changes: Secondary | ICD-10-CM | POA: Diagnosis present

## 2011-01-05 DIAGNOSIS — R040 Epistaxis: Secondary | ICD-10-CM | POA: Diagnosis not present

## 2011-01-05 DIAGNOSIS — E875 Hyperkalemia: Secondary | ICD-10-CM | POA: Diagnosis present

## 2011-01-05 DIAGNOSIS — N170 Acute kidney failure with tubular necrosis: Principal | ICD-10-CM | POA: Diagnosis present

## 2011-01-05 DIAGNOSIS — IMO0001 Reserved for inherently not codable concepts without codable children: Secondary | ICD-10-CM | POA: Diagnosis present

## 2011-01-05 DIAGNOSIS — E872 Acidosis, unspecified: Secondary | ICD-10-CM | POA: Diagnosis present

## 2011-01-05 DIAGNOSIS — R112 Nausea with vomiting, unspecified: Secondary | ICD-10-CM | POA: Diagnosis present

## 2011-01-05 DIAGNOSIS — T50995A Adverse effect of other drugs, medicaments and biological substances, initial encounter: Secondary | ICD-10-CM | POA: Diagnosis present

## 2011-01-05 LAB — BASIC METABOLIC PANEL
BUN: 83 mg/dL — ABNORMAL HIGH (ref 6–23)
BUN: 81 mg/dL — ABNORMAL HIGH (ref 6–23)
Calcium: 7.6 mg/dL — ABNORMAL LOW (ref 8.4–10.5)
Chloride: 116 mEq/L — ABNORMAL HIGH (ref 96–112)
Creatinine, Ser: 5.94 mg/dL — ABNORMAL HIGH (ref 0.4–1.5)
GFR calc Af Amer: 12 mL/min — ABNORMAL LOW (ref >60)
GFR calc Af Amer: 11 mL/min — ABNORMAL LOW (ref >60)
GFR calc non Af Amer: 10 mL/min — ABNORMAL LOW (ref >60)
GFR calc non Af Amer: 9 mL/min — ABNORMAL LOW (ref >60)
Potassium: 3.7 mEq/L (ref 3.5–5.1)

## 2011-01-05 LAB — GLUCOSE, CAPILLARY
Glucose-Capillary: 91 mg/dL (ref 70–99)
Glucose-Capillary: 177 mg/dL — ABNORMAL HIGH (ref 70–99)
Glucose-Capillary: 126 mg/dL — ABNORMAL HIGH (ref 70–99)

## 2011-01-05 LAB — CARDIAC PANEL(CRET KIN+CKTOT+MB+TROPI)
CK, MB: 4.7 ng/mL — ABNORMAL HIGH (ref 0.3–4.0)
CK, MB: 5.2 ng/mL — ABNORMAL HIGH (ref 0.3–4.0)
Relative Index: INVALID (ref 0.0–2.5)
Total CK: 69 U/L (ref 7–232)
Troponin I: 0.36 ng/mL — ABNORMAL HIGH (ref 0.00–0.06)

## 2011-01-05 LAB — MRSA PCR SCREENING: MRSA by PCR: NEGATIVE

## 2011-01-05 LAB — HEMOGLOBIN A1C
Hgb A1c MFr Bld: 6.8 % — ABNORMAL HIGH (ref <5.7)
Mean Plasma Glucose: 148 mg/dL — ABNORMAL HIGH (ref <117)

## 2011-01-05 LAB — KETONES, QUALITATIVE: Acetone, Bld: NEGATIVE

## 2011-01-05 LAB — CK TOTAL AND CKMB (NOT AT ARMC): CK, MB: 3.4 ng/mL (ref 0.3–4.0)

## 2011-01-05 LAB — TROPONIN I: Troponin I: 0.05 ng/mL (ref 0.00–0.06)

## 2011-01-05 NOTE — H&P (Signed)
NAME:  Tyler Mueller, Tyler Mueller NO.:  [id number]  MEDICAL RECORD NO.:  [id number]           PATIENT TYPE:  E  LOCATION:  WLED                         FACILITY:  [facility]  PHYSICIAN:  Jacquelynn Cree, M.D.   DATE OF BIRTH:  05/06/1940  DATE OF ADMISSION:  01/05/2011 DATE OF DISCHARGE:                             HISTORY & PHYSICAL   PRIMARY CARE PHYSICIAN:  Dr.  Perrin Maltese Ward  CHIEF COMPLAINT:  Nausea, vomiting, diarrhea.  HISTORY OF PRESENT ILLNESS:  The patient is a 70 year old male with past medical history of chronic immunosuppression secondary to a history of bilateral lung transplantation.  He presented to the hospital with a 2- day history of nausea, vomiting, diarrhea.  Symptoms have been associated with anorexia, but he is able to at times to keep solids and liquids down.  He has had loose stools approximately every 2 hours, since this illness began.  The patient states that he took some Imodium which "calmed it down."  He has had some chills but no frank fevers.  He claims that his wife had similar GI illness last week that resolved without any specific intervention.  He does have constant abdominal pain, rated 10 out of 10 at its worst and described as a dull aching sensation.  Upon initial evaluation in the emergency department, the patient was found to be in acute renal failure with severe metabolic acidosis and he has been referred to the hospitalist service for further inpatient evaluation and treatment.  PAST MEDICAL HISTORY: 1. 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 2. Hypertension. 3. History of malaria 4. Anemia. 5. Type 2 diabetes. 6. Dyslipidemia.  PAST SURGICAL HISTORY: 1. Bilateral lung transplant. 2. Appendectomy.  FAMILY HISTORY:  The patient's mother died in her 13s.  He is uncertain of the cause but notes she had hypertension.  The patient's father died at age 58 from unknown causes.  He has 13 siblings.   Two of his sisters are deceased.  One died from an aneurysm and the other possibly died with tuberculosis.  He has three healthy sons.  SOCIAL HISTORY:  The patient is married and lives in [place] with his wife.  His wife serves as his Media planner, her name is Gardell Farino and she can be reached at [phone].  The patient claims that he quit smoking approximately 15 years ago.  He denies using any alcohol or drugs.  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  ALLERGIES:  PENICILLIN.  CURRENT MEDICATIONS: 1. Lisinopril 20 mg p.o. daily. 2. Valganciclovir 450 mg p.o. daily. 3. Pravastatin 20 mg p.o. daily. 4. Gemfibrozil 600 mg p.o. b.i.d. 5. Glipizide 5 mg p.o. daily. 6. Aspirin 81 mg p.o. daily. 7. Sulfamethoxazole 800 mg/trimethoprim 160 mg p.o. daily. 8. Prednisone 5 mg p.o. b.i.d. 9. Tacrolimus 1 mg p.o. q.a.m., 1.5 mg p.o. q.p.m. 10.Rapamune 2 mg p.o. daily. 11.Ranitidine 150 mg p.o. b.i.d. 12.Folic acid 1 mg p.o. daily. 13.Magnesium oxide 400 mg p.o. daily. 14.Multivitamin 1 tablet p.o. daily. 15.Citracal/vitamin D 630 mg, 2 tablets  p.o. daily. 16.Iron 325 mg p.o. daily. 17.B12 1000 mcg p.o. daily. 18.Procrit 5000 mg subcutaneously weekly.  REVIEW OF SYSTEMS:  The patient endorses some mild dyspnea but no cough. He feels weak.  There has not been any chest pain, dysuria, or myalgias.  A comprehensive 14-point review of systems is otherwise unremarkable.  PHYSICAL EXAMINATION:  VITAL SIGNS:  Temperature 97.5, pulse 76, respirations 20, blood pressure 112/50. GENERAL:  Well-developed, well-nourished Caucasian male, in no acute distress. HEENT:  Normocephalic, atraumatic.  PERRL.  EOMI.  Oropharynx is clear. NECK:  Supple, no thyromegaly, no lymphadenopathy. No jugular venous distention. CHEST:  Lungs clear to auscultation bilaterally with good air movement. HEART:  Regular rate, rhythm.  No murmurs, rubs, or  gallops. ABDOMEN:  Soft, nontender, nondistended with normoactive bowel sounds. EXTREMITIES:  No clubbing, edema, cyanosis. SKIN:  Warm and dry.  No rashes. NEUROLOGIC:  The patient is alert and oriented x3.  Cranial nerves II through XII are grossly intact.  Nonfocal.  DATA REVIEW:  A 12-lead EKG shows normal sinus rhythm with an incomplete right bundle-branch block and T-wave inversions in the lateral leads.  LABORATORY DATA:  Sodium is 135, potassium 6.2, chloride 112, bicarb 7, BUN 81, creatinine 6.05, glucose 177, calcium 9.0.  White blood cell count of 15.2, hemoglobin 13.8, hematocrit 42.2, platelets 284,000. Urinalysis is negative for ketones and negative for glucose with trace leukocyte esterase.  There are 11 to 20 white blood cells and 3 to 6 red blood cells along with calcium oxalate crystals on microscopy.  The urine specimen appears to be contaminated with many urine epithelial cells.  ASSESSMENT/PLAN: 1. Gastroenteritis:  We will treat the patient conservatively with IV     fluids and antibiotics.  We will obtain stool studies including C-     difficile for PCR since the patient is on chronic antibiotic     therapy with sulfamethoxazole. 2. Profound dehydration with acute renal failure:  The patient's renal     failure is likely a combination of prerenal causes, dehydration,     and ongoing use of an ACE inhibitor.  We will hydrate the patient,     hold his ACE inhibitor, and monitor his renal function closely. 3. Hyperkalemia:  Likely due to acute renal failure.  The patient was     given 30 grams of Kayexalate.  We will monitor his potassium     closely and monitor him on telemetry. 4. Metabolic acidosis:  The patient does not appear to be in diabetic     ketoacidosis.  This is likely from volume contraction and     bicarbonate losses in the stool.  Nevertheless, we will check serum     ketone level and monitor his metabolic status closely. 5. Chronic  immunosuppressive therapy:  The patient does not appear to     have any indication for antibiotics at present.  This is most     likely viral gastroenteritis and not a bacterial colitis.  Will     monitor closely and initiate therapy with antibiotics if indicated. 6. Hypertension:  The patient's blood pressure is slightly low.  We     will hold antihypertensives for now. 7. Chronic anemia:  The patient's hemoglobin is currently stable. 8. Type 2 diabetes:  Place the patient on insulin sensitive sliding     scale. 9. Prophylaxis:  Initiate Lovenox for DVT prophylaxis.  Time spent on admission including face-to-face time equals approximately 1 hour.     Jacquelynn Cree,  M.D.     CR/MEDQ  D:  01/05/2011  T:  01/05/2011  Job:  [id number]  cc:   Anselmo Pickler, MD  Electronically Signed by Jacquelynn Cree M.D. on 01/05/2011 05:08:19 PM

## 2011-01-06 LAB — URINALYSIS, ROUTINE W REFLEX MICROSCOPIC
Ketones, ur: NEGATIVE mg/dL
Leukocytes, UA: NEGATIVE
Nitrite: NEGATIVE
Specific Gravity, Urine: 1.01 (ref 1.005–1.030)
Urine Glucose, Fasting: NEGATIVE mg/dL
pH: 5.5 (ref 5.0–8.0)

## 2011-01-06 LAB — BASIC METABOLIC PANEL
BUN: 82 mg/dL — ABNORMAL HIGH (ref 6–23)
Chloride: 112 mEq/L (ref 96–112)
GFR calc non Af Amer: 9 mL/min — ABNORMAL LOW (ref >60)
Glucose, Bld: 138 mg/dL — ABNORMAL HIGH (ref 70–99)
Potassium: 3.6 mEq/L (ref 3.5–5.1)
Sodium: 138 mEq/L (ref 135–145)

## 2011-01-06 LAB — GLUCOSE, CAPILLARY: Glucose-Capillary: 126 mg/dL — ABNORMAL HIGH (ref 70–99)

## 2011-01-06 LAB — CBC
MCHC: 33.8 g/dL (ref 30.0–36.0)
Platelets: 127 10*3/uL — ABNORMAL LOW (ref 150–400)
RDW: 16.2 % — ABNORMAL HIGH (ref 11.5–15.5)
WBC: 6.8 10*3/uL (ref 4.0–10.5)

## 2011-01-06 LAB — CARDIAC PANEL(CRET KIN+CKTOT+MB+TROPI)
CK, MB: 5.2 ng/mL — ABNORMAL HIGH (ref 0.3–4.0)
Total CK: 79 U/L (ref 7–232)
Troponin I: 0.56 ng/mL (ref 0.00–0.06)

## 2011-01-06 LAB — URINE MICROSCOPIC-ADD ON

## 2011-01-07 ENCOUNTER — Inpatient Hospital Stay (HOSPITAL_COMMUNITY): Payer: Medicare Other

## 2011-01-07 LAB — GLUCOSE, CAPILLARY
Glucose-Capillary: 105 mg/dL — ABNORMAL HIGH (ref 70–99)
Glucose-Capillary: 121 mg/dL — ABNORMAL HIGH (ref 70–99)

## 2011-01-07 LAB — CBC
HCT: 24.8 % — ABNORMAL LOW (ref 39.0–52.0)
MCH: 28.5 pg (ref 26.0–34.0)
MCV: 82.1 fL (ref 78.0–100.0)
RBC: 3.02 MIL/uL — ABNORMAL LOW (ref 4.22–5.81)
RDW: 15.9 % — ABNORMAL HIGH (ref 11.5–15.5)
WBC: 7.3 10*3/uL (ref 4.0–10.5)

## 2011-01-07 LAB — FOLATE: Folate: >20 ng/mL

## 2011-01-07 LAB — VITAMIN B12: Vitamin B-12: 826 pg/mL (ref 211–911)

## 2011-01-07 LAB — RENAL FUNCTION PANEL
BUN: 86 mg/dL — ABNORMAL HIGH (ref 6–23)
Chloride: 107 mEq/L (ref 96–112)
Creatinine, Ser: 7.02 mg/dL — ABNORMAL HIGH (ref 0.4–1.5)
Glucose, Bld: 84 mg/dL (ref 70–99)
Potassium: 3.3 mEq/L — ABNORMAL LOW (ref 3.5–5.1)

## 2011-01-07 LAB — FERRITIN: Ferritin: 1590 ng/mL — ABNORMAL HIGH (ref 22–322)

## 2011-01-07 LAB — UREA NITROGEN, URINE: Urea Nitrogen, Ur: 279 mg/dL

## 2011-01-07 NOTE — Consult Note (Signed)
NAME:  Tyler Mueller, Tyler Mueller NO.:  [id number]  MEDICAL RECORD NO.:  [id number]           PATIENT TYPE:  I  LOCATION:  [id number]                         FACILITY:  [facility]  PHYSICIAN:  Elmarie Shiley, MD          DATE OF BIRTH:  16-Feb-1941  DATE OF CONSULTATION:  01/06/2011 DATE OF DISCHARGE:                                CONSULTATION   CONSULTATION REQUESTED BY:  Nephrology is consulted by Dr. Jacquelynn Cree of the [facility] for the evaluation and management of Tyler Mueller's acute renal failure on chronic kidney disease stage 3/4.  HISTORY OF PRESENT ILLNESS:  Mr. Broccoli is a 70 year old Caucasian man who is status post bilateral lung transplant for bronchiectasis about 7 years ago and on chronic immunosuppression with sirolimus, tacrolimus and prednisone.  He has a baseline chronic kidney disease with a creatinine ranging from 2.1 to 2.4 and follows up with [facility], Nephrology.  He reports that he has been having episodes of fluctuating creatinine particularly coinciding with his volume status on account of recurrent nausea, vomiting and diarrhea.  At this time, he presented to the hospital with 2 days of progressively worsening nausea, vomiting and diarrhea that limited his oral intake, however, he continued to retain his medications.  He states that he continued to take lisinopril in addition to his other immunosuppressive medications/antihyperlipidemics. He denies any hematochezia, melena or hematemesis and reports a vague abdominal pain that he has had for a chronic duration.  He denies any dysuria, urgency, frequency, hematuria and states foamy urine on and off.  He denies any obvious fevers but states that he has had some chills in the past.  He was unfortunately in contact with his wife with the similar illness 1 week ago.  PAST MEDICAL HISTORY: 1. Status post bilateral lung transplant secondary to bronchiectasis. 2. 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 3. Hypertension. 4. Chronic immunosuppression. 5. Anemia of chronic disease - immunosuppression. 6. Type 2 diabetes mellitus. 7. Dyslipidemia. 8. Gastroesophageal reflux disease.  MEDICATIONS: 1. Lisinopril 20 mg p.o. daily. 2. Sirolimus 2 mg p.o. daily. 3. Prograf 1 mg p.o. q.a.m. and 1.5 mg p.o. q.p.m. 4. Bactrim double strength 1 tablet p.o. daily 5. Glipizide 5 mg p.o. daily 6. Gemfibrozil 600 mg p.o. b.i.d. 7. Valganciclovir 450 mg p.o. daily. 8. Pravastatin 20 mg p.o. daily 9. Ranitidine 150 mg p.o. b.i.d. 10.Prednisone 5 mg p.o. b.i.d. 11.Magnesium oxide 400 mg p.o. daily 12.Multivitamin p.o. daily. 13.Citracal D 630 mg 2 tablets p.o. daily. 14.Iron sulfate 325 mg p.o. daily. 15.Vitamin B12 1000 mcg p.o. daily. 16.Procrit 5000 units subcutaneously q. weekly.  ALLERGIES:  Reports a cutaneous rash to PENICILLIN.  SOCIAL HISTORY:  Married, resides at home with his wife in [place].  Denies smoking after quitting about 15 years ago.  Denies any active alcohol or drug use.  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  FAMILY HISTORY:  Mother died at the age of 39 from unclear causes. Father died at age of 54, unclear causes.  Thirteen siblings.  No significant history of kidney disease.  REVIEW OF SYSTEMS:  Comprehensive 14-point review of systems was done and other than the HPI above was negative.  PHYSICAL EXAMINATION:  VITAL SIGNS:  Blood pressure 150/67, heart rate 93, temperature 98.8 degrees Fahrenheit. GENERAL EVALUATION:  Middle-aged Caucasian man, medium built, resting comfortably in bed. HEAD AND ENT SYSTEMS:  Head is normocephalic and atraumatic.  A scar is visible over the right temple area from a previous injury.  Lips appear chapped, and oral mucosa still appears dry.  Extraocular muscle movements are normal. NECK:  Supple without any obvious JVD  or goiter.  No lymphadenopathy. CARDIOVASCULAR SYSTEM:  Pulse regular in rate and rhythm.  Heart sounds S1, S2 accompanied by an ejection systolic murmur. RESPIRATORY:  Distant breath sounds bilaterally.  No rales, retractions or rhonchi. ABDOMEN:  Soft, obese, mild tenderness over the epigastric area.  No rebound.  Bowel sounds are normal. EXTREMITIES:  No edema is palpable in either lower extremity.  LABORATORY DATA:  Sodium 138, potassium 3.6, bicarbonate 13, BUN 82, creatinine 6.3, glucose 138, calcium 7.5, hemoglobin 8.8, hematocrit 26, white cell count 6800, platelet count 127,000.  Serum ketones negative.  ASSESSMENT AND PLAN: 1. Acute renal failure on chronic kidney disease stage 3/4.  His urine     output is marginal and currently appears oliguric with 175 cc urine     output so far today (5 hours).  Would recommend continued     intravenous fluid resuscitation, however, change the fluid to     isotonic sodium bicarbonate to avoid problems with hyponatremia     given his acute renal insufficiency.  I suspect that he has     multifactorial underlying chronic kidney disease with additional     components from ongoing calcium urine inhibitor use.  His most     recent acute renal failure appears to be volume mediated with GI     losses in the face of ongoing ACE inhibitor use.  Given the     duration as well as the circumstances, he may have evolved into     ischemic ATN.  We will check urine electrolytes, urinalysis as well     as renal ultrasound.  No acute hemodialysis needs are noted at this     time, however, we will reassess closely with labs as well as     clinical evaluations.  He denies any preceding history of NSAIDS or     intravenous contrast or oral antibiotic use. 2. Nonanion gap acidosis/anion gap acidosis.  Suspect that the former     is from gastrointestinal losses/diarrhea versus renal tubular     acidosis while the latter is from acute kidney injury.  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 3. Hyperkalemia.  This is resolved and likely to have been mediated by     acute renal failure with ongoing ACE inhibitors - Prograf use.  We     will continue to monitor him closely on intravenous fluids status     post Kayexalate. 4. Anemia.  Likely anemia of chronic disease/immunosuppression.     Anemia panels are pending and will start him on/restart Epogen if     iron stores are replete. 5. Gastroenteritis.  Improving with symptomatic management, stool  studies are pending given history of venous suppression.     Elmarie Shiley, MD     JP/MEDQ  D:  01/06/2011  T:  01/06/2011  Job:  [id number]  Electronically Signed by Elmarie Shiley MD on 01/07/2011 02:14:47 PM

## 2011-01-08 LAB — GLUCOSE, CAPILLARY: Glucose-Capillary: 107 mg/dL — ABNORMAL HIGH (ref 70–99)

## 2011-01-08 LAB — RENAL FUNCTION PANEL
Albumin: 2.5 g/dL — ABNORMAL LOW (ref 3.5–5.2)
BUN: 84 mg/dL — ABNORMAL HIGH (ref 6–23)
Chloride: 102 mEq/L (ref 96–112)
Creatinine, Ser: 7.17 mg/dL — ABNORMAL HIGH (ref 0.4–1.5)
Phosphorus: 6.4 mg/dL — ABNORMAL HIGH (ref 2.3–4.6)

## 2011-01-08 LAB — MAGNESIUM: Magnesium: 1.5 mg/dL (ref 1.5–2.5)

## 2011-01-08 LAB — CBC
MCH: 28.3 pg (ref 26.0–34.0)
MCHC: 34.4 g/dL (ref 30.0–36.0)
MCV: 82.4 fL (ref 78.0–100.0)
Platelets: 105 10*3/uL — ABNORMAL LOW (ref 150–400)
RDW: 15.8 % — ABNORMAL HIGH (ref 11.5–15.5)

## 2011-01-09 LAB — GLUCOSE, CAPILLARY
Glucose-Capillary: 141 mg/dL — ABNORMAL HIGH (ref 70–99)
Glucose-Capillary: 169 mg/dL — ABNORMAL HIGH (ref 70–99)

## 2011-01-09 LAB — CBC
HCT: 28.1 % — ABNORMAL LOW (ref 39.0–52.0)
Hemoglobin: 9.2 g/dL — ABNORMAL LOW (ref 13.0–17.0)
MCV: 85.4 fL (ref 78.0–100.0)
RDW: 15.9 % — ABNORMAL HIGH (ref 11.5–15.5)
WBC: 10.4 10*3/uL (ref 4.0–10.5)

## 2011-01-09 LAB — RENAL FUNCTION PANEL
BUN: 77 mg/dL — ABNORMAL HIGH (ref 6–23)
CO2: 32 mEq/L (ref 19–32)
Glucose, Bld: 95 mg/dL (ref 70–99)
Phosphorus: 5.1 mg/dL — ABNORMAL HIGH (ref 2.3–4.6)
Potassium: 4 mEq/L (ref 3.5–5.1)
Sodium: 145 mEq/L (ref 135–145)

## 2011-01-09 LAB — STOOL CULTURE

## 2011-01-09 LAB — MAGNESIUM: Magnesium: 1.7 mg/dL (ref 1.5–2.5)

## 2011-01-10 LAB — CBC
HCT: 27.9 % — ABNORMAL LOW (ref 39.0–52.0)
Hemoglobin: 9 g/dL — ABNORMAL LOW (ref 13.0–17.0)
MCHC: 32.3 g/dL (ref 30.0–36.0)
RBC: 3.19 MIL/uL — ABNORMAL LOW (ref 4.22–5.81)
WBC: 11.2 10*3/uL — ABNORMAL HIGH (ref 4.0–10.5)

## 2011-01-10 LAB — GLUCOSE, CAPILLARY
Glucose-Capillary: 191 mg/dL — ABNORMAL HIGH (ref 70–99)
Glucose-Capillary: 190 mg/dL — ABNORMAL HIGH (ref 70–99)
Glucose-Capillary: 183 mg/dL — ABNORMAL HIGH (ref 70–99)
Glucose-Capillary: 143 mg/dL — ABNORMAL HIGH (ref 70–99)

## 2011-01-10 LAB — RENAL FUNCTION PANEL
CO2: 32 mEq/L (ref 19–32)
Calcium: 6.4 mg/dL — CL (ref 8.4–10.5)
Chloride: 95 mEq/L — ABNORMAL LOW (ref 96–112)
GFR calc Af Amer: 11 mL/min — ABNORMAL LOW (ref >60)
GFR calc non Af Amer: 9 mL/min — ABNORMAL LOW (ref >60)
Glucose, Bld: 166 mg/dL — ABNORMAL HIGH (ref 70–99)
Potassium: 4.2 mEq/L (ref 3.5–5.1)
Sodium: 141 mEq/L (ref 135–145)

## 2011-01-11 LAB — HEPATIC FUNCTION PANEL
AST: 35 U/L (ref 0–37)
Albumin: 2.7 g/dL — ABNORMAL LOW (ref 3.5–5.2)
Alkaline Phosphatase: 87 U/L (ref 39–117)
Bilirubin, Direct: 0.2 mg/dL (ref 0.0–0.3)
Total Bilirubin: 1.4 mg/dL — ABNORMAL HIGH (ref 0.3–1.2)

## 2011-01-11 LAB — GLUCOSE, CAPILLARY
Glucose-Capillary: 155 mg/dL — ABNORMAL HIGH (ref 70–99)
Glucose-Capillary: 117 mg/dL — ABNORMAL HIGH (ref 70–99)
Glucose-Capillary: 104 mg/dL — ABNORMAL HIGH (ref 70–99)

## 2011-01-11 LAB — RENAL FUNCTION PANEL
CO2: 27 mEq/L (ref 19–32)
Calcium: 6.9 mg/dL — ABNORMAL LOW (ref 8.4–10.5)
Creatinine, Ser: 5.65 mg/dL — ABNORMAL HIGH (ref 0.4–1.5)
Glucose, Bld: 157 mg/dL — ABNORMAL HIGH (ref 70–99)
Phosphorus: 2.8 mg/dL (ref 2.3–4.6)

## 2011-01-11 LAB — PROTIME-INR: INR: 4.16 — ABNORMAL HIGH (ref 0.00–1.49)

## 2011-01-11 LAB — HEMOGLOBIN AND HEMATOCRIT, BLOOD
HCT: 27.5 % — ABNORMAL LOW (ref 39.0–52.0)
Hemoglobin: 8.7 g/dL — ABNORMAL LOW (ref 13.0–17.0)

## 2011-01-12 DIAGNOSIS — D689 Coagulation defect, unspecified: Secondary | ICD-10-CM

## 2011-01-12 LAB — PREPARE FRESH FROZEN PLASMA: Unit division: 0

## 2011-01-12 LAB — RENAL FUNCTION PANEL
Calcium: 7.6 mg/dL — ABNORMAL LOW (ref 8.4–10.5)
GFR calc Af Amer: 13 mL/min — ABNORMAL LOW (ref >60)
GFR calc non Af Amer: 11 mL/min — ABNORMAL LOW (ref >60)
Glucose, Bld: 102 mg/dL — ABNORMAL HIGH (ref 70–99)
Phosphorus: 3.7 mg/dL (ref 2.3–4.6)
Potassium: 4.6 mEq/L (ref 3.5–5.1)
Sodium: 139 mEq/L (ref 135–145)

## 2011-01-12 LAB — PROTIME-INR
INR: 2.62 — ABNORMAL HIGH (ref 0.00–1.49)
INR: 2.99 — ABNORMAL HIGH (ref 0.00–1.49)
Prothrombin Time: 28.1 seconds — ABNORMAL HIGH (ref 11.6–15.2)
Prothrombin Time: 31.1 seconds — ABNORMAL HIGH (ref 11.6–15.2)

## 2011-01-12 LAB — GLUCOSE, CAPILLARY: Glucose-Capillary: 239 mg/dL — ABNORMAL HIGH (ref 70–99)

## 2011-01-12 LAB — CBC
HCT: 29 % — ABNORMAL LOW (ref 39.0–52.0)
Hemoglobin: 9 g/dL — ABNORMAL LOW (ref 13.0–17.0)
MCHC: 31 g/dL (ref 30.0–36.0)
RDW: 15.7 % — ABNORMAL HIGH (ref 11.5–15.5)
WBC: 12.4 10*3/uL — ABNORMAL HIGH (ref 4.0–10.5)

## 2011-01-13 ENCOUNTER — Inpatient Hospital Stay (HOSPITAL_COMMUNITY): Payer: Medicare Other

## 2011-01-13 LAB — PROTIME-INR: INR: 2.57 — ABNORMAL HIGH (ref 0.00–1.49)

## 2011-01-13 LAB — COMPREHENSIVE METABOLIC PANEL
ALT: 18 U/L (ref 0–53)
AST: 31 U/L (ref 0–37)
Albumin: 2.7 g/dL — ABNORMAL LOW (ref 3.5–5.2)
Chloride: 102 mEq/L (ref 96–112)
Creatinine, Ser: 5.55 mg/dL — ABNORMAL HIGH (ref 0.4–1.5)
GFR calc Af Amer: 12 mL/min — ABNORMAL LOW (ref >60)
Sodium: 139 mEq/L (ref 135–145)
Total Bilirubin: 1.2 mg/dL (ref 0.3–1.2)

## 2011-01-13 LAB — CBC
HCT: 27.2 % — ABNORMAL LOW (ref 39.0–52.0)
Hemoglobin: 8.5 g/dL — ABNORMAL LOW (ref 13.0–17.0)
MCV: 89.5 fL (ref 78.0–100.0)
RBC: 3.04 MIL/uL — ABNORMAL LOW (ref 4.22–5.81)
RDW: 15.6 % — ABNORMAL HIGH (ref 11.5–15.5)
WBC: 11.3 10*3/uL — ABNORMAL HIGH (ref 4.0–10.5)

## 2011-01-13 LAB — GLUCOSE, CAPILLARY
Glucose-Capillary: 366 mg/dL — ABNORMAL HIGH (ref 70–99)
Glucose-Capillary: 75 mg/dL (ref 70–99)

## 2011-01-14 LAB — PTT FACTOR INHIBITOR (MIXING STUDY)

## 2011-01-19 ENCOUNTER — Encounter: Payer: Self-pay | Admitting: Pulmonary Disease

## 2011-01-19 ENCOUNTER — Other Ambulatory Visit: Payer: Self-pay | Admitting: Pulmonary Disease

## 2011-01-19 ENCOUNTER — Encounter (INDEPENDENT_AMBULATORY_CARE_PROVIDER_SITE_OTHER): Payer: Self-pay | Admitting: *Deleted

## 2011-01-19 ENCOUNTER — Other Ambulatory Visit: Payer: Medicare Other

## 2011-01-19 DIAGNOSIS — T86819 Unspecified complication of lung transplant: Secondary | ICD-10-CM

## 2011-01-19 DIAGNOSIS — Z79899 Other long term (current) drug therapy: Secondary | ICD-10-CM

## 2011-01-19 LAB — BASIC METABOLIC PANEL
CO2: 18 mEq/L — ABNORMAL LOW (ref 19–32)
Calcium: 8.7 mg/dL (ref 8.4–10.5)
Creatinine, Ser: 3.6 mg/dL — ABNORMAL HIGH (ref 0.4–1.5)
Glucose, Bld: 88 mg/dL (ref 70–99)

## 2011-01-19 LAB — PROTIME-INR
INR: 1.4 ratio — ABNORMAL HIGH (ref 0.8–1.0)
Prothrombin Time: 15.2 s — ABNORMAL HIGH (ref 10.2–12.4)

## 2011-01-19 LAB — CBC WITH DIFFERENTIAL/PLATELET
Basophils Absolute: 0 10*3/uL (ref 0.0–0.1)
Eosinophils Absolute: 0.2 10*3/uL (ref 0.0–0.7)
Lymphocytes Relative: 14.1 % (ref 12.0–46.0)
MCHC: 33 g/dL (ref 30.0–36.0)
Neutrophils Relative %: 65.8 % (ref 43.0–77.0)
RDW: 16.2 % — ABNORMAL HIGH (ref 11.5–14.6)

## 2011-01-19 LAB — MAGNESIUM: Magnesium: 1.9 mg/dL (ref 1.5–2.5)

## 2011-01-23 NOTE — Discharge Summary (Signed)
NAME:  Tyler Mueller, Tyler Mueller NO.:  [id number]  MEDICAL RECORD NO.:  [id number]           PATIENT TYPE:  I  LOCATION:  [id number]                         FACILITY:  [facility]  PHYSICIAN:  Vernell Leep, MD     DATE OF BIRTH:  10-30-1941  DATE OF ADMISSION:  01/05/2011 DATE OF DISCHARGE:  01/12/2011                        DISCHARGE SUMMARY - REFERRING   PRIMARY CARE PHYSICIAN:  Dr. Perrin Maltese Ward.  Transplant MD is at the [facility].  ACCEPTING PHYSICIAN:  Dr. Wynn Maudlin.  DISCHARGE DIAGNOSES: 1. Acute renal failure secondary to acute tubular necrosis from     dehydration, in the setting of ongoing angiotensin-converting enzyme     inhibitor and diuretic use.  Improving. 2. 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 3. Epistaxis, resolved. 4. Coagulopathy, unclear etiology. 5. Gastroenteritis, resolved. 6. Non-ST-elevation myocardial infarction. 7. Hypocalcemia. 8. Chronic immunosuppressive therapy secondary to history of bilateral     lung transplantation. 9. Type 2 diabetes mellitus. 10.Uncontrolled hypertension. 11.Anemia of chronic disease with acute blood loss anemia from     epistaxis. 12.Hypokalemia/hyperkalemia, resolved. 13.Metabolic acidosis, resolved. 14.Thrombocytopenia, improved.  DISCHARGE MEDICATIONS: 1. Tylenol 650 mg p.o. q.4 h. p.r.n. for pain or fever. 2. Albuterol 2.5 mg nebulizations inhaled q.2 h. p.r.n. for dyspnea or     wheezing. 3. Amlodipine 5 mg p.o. daily. 4. Hydralazine 10 mg IV q.6 h. p.r.n. for systolic blood pressure     greater than [id number] or diastolic blood pressure greater than 100. 5. Zofran 4 mg IV or p.o. q.6 h. p.r.n. for nausea. 6. Metoprolol tartrate 50 mg p.o. b.i.d. 7. Calcium carbonate/vitamin D, 630 mg/500 mg, 1 tablet p.o. daily. 8. Ferrous gluconate 325 mg p.o. daily. 9. Folic acid 1 mg p.o. daily. 10.Magnesium oxide 1 tablet p.o.  daily. 11.Multivitamins 1 tablet p.o. daily. 12.Prednisone 5 mg p.o. b.i.d. 13.Procrit 5000 units subcutaneously q.2 weeks. 14.Prograf 1 mg capsule.  The patient takes 2 capsules in the morning     and 1 capsule at night and then alternates the next day with 1     capsule in the morning and 1 at night. 15.Sirolimus 2 mg p.o. daily. 16.Septra DS 800/160 mg, half tablet p.o. daily. 17.Vitamin B12 1000 mcg p.o. daily. 18.Zantac 150 mg p.o. b.i.d. 19.NovoLog sliding scale insulin subcutaneously t.i.d. with meal as     follows:  For CBG less than 70 mg/dL, implement hypoglycemia     protocol; 70-120 mg/dL, no insulin; 121-150 mg/dL, 1 unit; 151-200     mg/dL, 2 units; 201-250 mg/dL, 3 units; 251-300 mg/dL, 5 units; 301-     350 mg/dL, 7 units; 351-400 mg/dL, 9 units; greater than 400 mg/dL,     call MD. 20.NovoLog insulin subcutaneously q.h.s. as per scale below:  For CBG     less than 70 mg/dL, implement hypoglycemia protocol; 70-200 mg/dL,     no insulin; 201-250 mg/dL, 2 units; 251-300 mg/dL, 3 units; 301-350     mg/dL, 4 units; 351-400 mg/dL, 5 units; greater than 400 mg/dL,     call MD.  DISCONTINUED  MEDICATIONS: 1. Lisinopril. 2. Pravastatin 3. Gemfibrozil. 4. Enteric-coated aspirin. 5. Glipizide.  PROCEDURES AND IMAGING: 1. A 2-D echocardiogram, January 06, 2011.  In conclusion, left     ventricle cavity size was normal.  There was moderate concentric     hypertrophy.  Systolic function was normal.  Wall motion was     normal.  There were no regional wall motion abnormalities.  Trivial     aortic valve regurgitation.  Calcified mitral valve. 2. Renal ultrasound.  Impression:  A.)  Exophytic 13-mm medial mid     right renal cyst.  B.)  Slight thinning of the renal cortex with     otherwise sonographically normal urinary tract. 3. Abdominal x-ray on February 1.  Impression:  Scattered air-fluid     levels with mild small bowel dilatation.  There is also gaseous     distention  of the colon.  This can be associated with enteric     infection, acute pancreatitis, or early small bowel obstruction. 4. Chest x-ray on February 1.  Impression:  No evidence of pneumonia     or acute cardiopulmonary disease.  Postoperative changes of the     chest.  LAB DATA:  PTT is 69, PT is 28.1, INR 2.62.  Renal panel remarkable for glucose 102, BUN 55, creatinine 5.36, albumin 2.7, calcium 7.6, and phosphorus 3.7.  CBC:  Hemoglobin 9, hematocrit 29, white blood cell 12.4, platelets 116.  INR yesterday was 4.16, serum tacrolimus level was 13.9.  Stool culture was negative.  Anemia panel showed B12 826, serum folate greater than 20, and ferritin 1590.  Urine analysis was negative for features of urinary tract infection.  C difficile by PCR was negative.  Cardiac enzymes showed elevated troponin peak of 0.56. Hemoglobin A1c was 6.8.  Admitting basic metabolic panel showed sodium 139, potassium 5.3, chloride 121, bicarbonate 8, glucose 97, BUN 83, and creatinine 5.94.  CONSULTATIONS: 1. Nephrology, Dr. Elmarie Shiley. 2. Hematology/Oncology, Dr. Sophronia Simas.  Input is pending.  DIET:  Diabetic diet.  ACTIVITIES:  Increase activity slowly and with assist.  CHIEF COMPLAINTS TODAY:  The patient indicates that his appetite is not fully back.  He feels generally weak, but is improving.  There are no further nose bleeds for almost 2 days.  PHYSICAL EXAMINATION:  GENERAL:  The patient is in no obvious distress. VITAL SIGNS:  Temperature maximum 99.6 degrees Fahrenheit, pulse 100 per minute regular, respirations 18 per minute, blood pressure 150/78 mmHg which is ranging from 148 mmHg to [id number] mmHg systolic and 76 mmHg to 89 mmHg diastolic, oxygen [id number] on 3 L of oxygen.  CBG ranging from 104 mg/dL to 155 mg/dL. RESPIRATORY SYSTEM:  Clear.  No increased work of breathing. CARDIOVASCULAR SYSTEM:  First and second heart sounds heard.  Regular rate and rhythm.  No JVD. ABDOMEN:  Soft, and  bowel sounds present. CENTRAL NERVOUS SYSTEM:  The patient awake, alert, oriented x3 with no focal neurological deficits. EXTREMITIES:  Bilateral upper extremity bruising, but otherwise 5/5 power.  HOSPITAL COURSE:  Tyler Mueller is a 71 year old Caucasian male patient with history of chronic immunosuppressive therapy secondary to history of bilateral lung transplantation for bronchiectasis who is followed at the [facility].  He presented with 2-day history of nausea, vomiting, and diarrhea and abdominal pain.  Initial evaluation in the ED showed patient to be in acute renal failure with severe metabolic acidosis.  The [facility] were requested to admit for further evaluation and management.  1. 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     The patient was admitted and placed on IV fluids containing     bicarbonate, given his concurrent severe metabolic acidosis.     Despite hydration, the patient's creatinine did not improve and,     therefore, a Nephrology consultation was requested and provided by     Dr. Elmarie Shiley.  It was felt that the patient's renal failure was     triggered by his episode of dehydration in the setting of ongoing     ACE inhibitor and diuretic use.  These medications were obviously     discontinued.  He was continued on IV fluids and supportive     therapy.  His creatinine is gradually trending downwards.     According to my discussion with his transplant MD's, his creatinine     in January was 3.2.  This will be monitored by daily basic     metabolic panel. 2. Coagulopathy.  The etiology of this is not clear.  He was on     Lovenox and aspirin, which have been discontinued.  He has not been     on Coumadin.  Unclear if this is secondary to some of his     immunosuppressive medications.  A Hematology consultation has been     requested and their  input is pending. 3. Epistaxis.  The patient had epistaxis 2 days ago.  He was     coagulopathic.  He received 2 units of FFP.  The INR is better and     his epistaxis has currently resolved. 4. Gastroenteritis, possibly viral.  Resolved. 5. Non-ST-elevation MI.  The patient had cardiac markers cycled q.8 h.     On initial presentation, his initial troponin was 0.05, but     subsequent values were elevated, reaching a high of 0.56.  It was     felt to be secondary to demand ischemia in the setting of decreased     renal clearance.  2D echocardiogram did not show any evidence of     regional wall motion abnormalities and 12-lead EKG tracing did not     show any T-wave elevations.  He did have some evidence of lateral     ischemia on EKG tracing.  He was placed on supplemental oxygen and     continues to deny any chest pain.  He was put on aspirin, but this     has been discontinued secondary to his coagulopathy and epistaxis. 6. Hypocalcemia.  The patient's calcium level was supplemented in the     hospital. 7. Chronic immunosuppressive therapy secondary to bilateral lung     transplantation.  I discussed with Dr. Randol Kern, who indicates that     his Prograf level may be higher than what he needs to be on and     this may need to be titrated when he gets transferred to the [facility]. 8. Type 2 diabetes mellitus.  Reasonable control while in hospital.     His sulfonylureas will be held secondary to his decreased renal     clearance and the risk of hypoglycemia. 9. Anemia of chronic disease plus acute blood loss anemia.  Anemia     panel is as indicated above.  His CBCs need to be monitored daily. 10.Hypertension.  Not adequately controlled on metoprolol  alone.     After discussing with the renal MDs, Norvasc has been added and     this can be further monitored and titrated. 11.Hypokalemia and hyperkalemia, resolved. 12.Metabolic acidosis secondary to acute renal  failure, resolved.  DISPOSITION:  I have discussed the patient's care in detail with the Transplant MD at [facility], that is Dr. Wynn Maudlin.  Given that this patient is closely followed by their service and he has multiorgan dysfunction, they would like to transfer him to their service at the [facility] for further evaluation and appropriate management.  We believe this is in the best interest of the patient and his care.  Will await transfer of patient to the [facility] when a bed becomes available.  Time taken in coordinating this discharge was 45 minutes.     Vernell Leep, MD     AH/MEDQ  D:  01/12/2011  T:  01/12/2011  Job:  [id number]  cc:   Elmarie Shiley, MD  Dr. Wynn Maudlin [facility]  Electronically Signed by Vernell Leep MD on 01/23/2011 10:24:40 PM

## 2011-01-24 NOTE — Progress Notes (Signed)
NAME:  Tyler Mueller, Tyler Mueller NO.:  [id number]  MEDICAL RECORD NO.:  [id number]           PATIENT TYPE:  I  LOCATION:  [id number]                         FACILITY:  [facility]  PHYSICIAN:  Jacquelynn Cree, M.D.   DATE OF BIRTH:  15-Sep-1941                                PROGRESS NOTE   DATE OF DISCHARGE: Pending.  PRIMARY CARE PHYSICIAN: Anselmo Pickler, MD.  CURRENT DIAGNOSES: 1. Acute renal failure secondary to acute tubular necrosis from     dehydration in the setting of ongoing angiotensin-converting enzyme     inhibitor and diuretic use. 2. Underlying stage III to IV chronic kidney disease. 3. Epistaxis. 4. Gastroenteritis. 5. Non-ST elevation myocardial infarction. 6. Hypocalcemia. 7. Chronic immunosuppressive therapy secondary to history of lung     transplantation. 8. Type 2 diabetes. 9. Anemia of chronic disease with acute blood loss anemia from     epistaxis. 10.Coagulopathy. 11.Hypertension. 12.Hypokalemia/hyperkalemia. 13.Metabolic acidosis.  DISCHARGE MEDICATIONS: Will be dictated at the time of actual discharge.  CONSULTATIONS: Elmarie Shiley, MD, Nephrology.  BRIEF ADMISSION AND HPI: The patient is a 70 year old male with past medical history of chronic immunosuppressive therapy secondary to history of bilateral lung transplantation, who presented to the hospital with a 2-day history of nausea, vomiting, and diarrhea.  He also complained of abdominal pain. Upon initial evaluation in the emergency department, the patient was found to be in acute renal failure with severe metabolic acidosis. Subsequently, he was referred to the hospitalist service for further inpatient evaluation and treatment.  For the full details, please see my dictated H and P.  PROCEDURES AND DIAGNOSTIC STUDIES: 1. Chest x-ray on January 05, 2011, showed no evidence of pneumonia or     acute cardiopulmonary disease.  Postoperative changes of the chest. 2. Two-views of the abdomen on  January 05, 2011, showed scattered air-     fluid levels with mild small bowel dilatation.  Gaseous distention     of the colon. 3. Renal ultrasound on January 07, 2011, showed an exophytic 13-mm     medial right renal cyst.  Slight thinning of the renal cortex with     otherwise sonographically normal urinary tract 4. Two-dimensional echocardiogram on January 06, 2011, showed moderate     concentric left ventricle hypertrophy with normal systolic function     and no regional wall motion abnormality.  DISCHARGE LABORATORY VALUES: Will be dictated at time of actual discharge.  HOSPITAL COURSE: 1. Acute renal failure secondary to acute tubular necrosis from     dehydration in the setting of stage III to IV chronic kidney     disease:  The patient was admitted and put on IV fluids containing     bicarbonate given his concurrent severe metabolic acidosis.     Despite hydration, the patient's creatinine did not improve, and     therefore a nephrology consultation was requested and kindly     provided by Dr. Elmarie Shiley.  It was felt that the patient's renal     failure was triggered by his episode of dehydration in the setting     of ongoing ACE inhibitor and  diuretic use.  He was continued on IV     fluids and supportive care and at this time, his creatinine is     beginning to slowly improve over time.  His baseline creatinine is     2.4 and he continues to be markedly above this on diagnostic     testing.  We will continue to monitor him for resolution of his     acute renal failure and discharge him when deemed appropriate by     Nephrology. 2. Epistaxis:  The patient had an episode of epistaxis on January 10, 2011.  Coagulation studies were done which showed that the patient     was hypercoagulable.  He was given 1 unit of fresh frozen plasma     and will be given an additional unit today.  Lovenox and aspirin     have been placed on hold. 3. Gastroenteritis:  The patient's  presenting symptoms were of     gastroenteritis.  This has completely resolved. 4. Non-ST elevation myocardial infarction:  The patient had cardiac     markers cycled every 8 hours on initial presentation.  His initial     troponin was 0.05, but subsequent values were elevated and reached     a high of 0.56.  The patient's elevated troponins were felt to be     secondary to demand ischemia in the setting of decreased renal     clearance.  A two-dimensional echocardiogram did not show any     evidence of regional wall motion abnormalities, and 12-lead EKG     tracings did not show any T-wave elevation.  He did have some     evidence of lateral ischemia on EKG tracings.  He was provided with     supplemental oxygen and continues to deny any chest pain.  He was     put on aspirin but this has now been placed on hold because of his     coagulopathy and problems with epistaxis. 5. Hypocalcemia:  The patient's calcium level was checked throughout     his hospital stay, and he was supplemented the calcium gluconate as     needed. 6. Chronic immunosuppressive therapy secondary to history of bilateral     lung transplantation:  The patient did have a Prograf level done     while in the hospital and it was found to be therapeutic at 13.9.     He was continued on his usual outpatient regimen. 7. Type 2 diabetes:  The patient has had reasonable control while in     the hospital.  We continued to monitor his blood glucoses before     meals and at bedtime and provided him with sliding scale insulin. 8. Anemia of chronic disease plus acute blood loss anemia:  The     patient did have an anemia panel done which does not show any     evidence of deficiencies of iron, B12, or serum folate.  At this     point, his primary anemia is of chronic disease.  However, with his     episode of epistaxis, he may have experienced a further decline in     his hemoglobin.  We will monitor this closely and transfuse him  as     needed. 9. Coagulopathy:  Likely triggered by aspirin and Lovenox.  The     patient does not have any evidence of DIC triggering this.  At this  point, Lovenox and aspirin have been discontinued and he will be     given a second unit of fresh frozen plasma.  We will monitor his PT     and PTT for resolution. 10.Hypertension:  The patient was placed on metoprolol and we are     actively titrating this up to achieve good blood pressure control. 11.Hypokalemia/hyperkalemia:  The patient's potassium has normalized.     He was given a dose of Kayexalate and subsequently supplemented to     achieve normal levels of serum potassium. 12.Metabolic acidosis:  The patient was placed on an IV fluid drip     containing bicarbonate until his bicarbonate levels normalized.  DISPOSITION: The patient is not yet medically stable for discharge.  A discharge summary addendum will be dictated at the time of actual discharge.     Jacquelynn Cree, M.D.     CR/MEDQ  D:  01/11/2011  T:  01/11/2011  Job:  [id number]  cc:   Anselmo Pickler, MD  Electronically Signed by Jacquelynn Cree M.D. on 01/24/2011 04:23:38 PM

## 2011-01-30 NOTE — Consult Note (Signed)
NAME:  Tyler Mueller, Tyler Mueller NO.:  [id number]  MEDICAL RECORD NO.:  [id number]           PATIENT TYPE:  I  LOCATION:  [id number]                         FACILITY:  [facility]  PHYSICIAN:  Justin Buechner I. Adalia Pettis, M.D.DATE OF BIRTH:  1941/06/10  DATE OF CONSULTATION:  01/12/2011 DATE OF DISCHARGE:                                CONSULTATION   REQUESTING PHYSICIAN:  Vernell Leep, MD  REASON FOR CONSULT:  Coagulopathy.  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  The patient is on chronic sirolimus, tacrolimus, and prednisone.  The patient was admitted with acute-on-chronic renal failure secondary to ATN, in the setting of ACE inhibitor and diuretic use. Upon presentation, the patient had progressive nausea and diarrhea.  As a result of these, his p.o. intake was limited.  Of note, his wife had similar symptoms 3 weeks prior to this admission.  The patient is being managed conservatively with gentle hydration, correction of electrolyte disturbances, and Epogen for anemia.  On admission, he also was placed on Lovenox 40 mg q.24 hours for DVT prophylaxis that was discontinued on January 11, 2011, after the patient began to have epistaxis with coagulopathy and thrombocytopenia.   Review of the lab results are as follows:  On January 06, 2011, his H and H was 8.8 and 26, with a white count of 6.8 and platelets of 127.  On January 07, 2011, his H and H was 8.6 and 24.8 respectively, with a white count of 7.3 and platelets 121.  On January 12, 2011, his H and H was 9 and 29, with a white count of 12.4 and 116,000 of platelets. On January 11, 2011, PT and INR were 40.1 and 4.16 respectively, with a PTT of 96.  On January 12, 2011, his PT and INR were 28.1 and 246  respectively, with a PTT of 69 after the initiation of 1 unit FFP on January 11, 2011.  BUN and creatinine are 55 and 5.36 respectively.  Total bilirubin was 1.7, with alkaline phosphatase of 87, AST 35, ALT 17, and total protein of 5.9.  PAST MEDICAL HISTORY: 1. Acute-on-chronic renal failure in a patient with a history of stage     3-4 CKD, with a baseline creatinine of about 2.4. 2. NSTEMI during this admission. 3. History of GE during this admission, not likely infectious. 4. Hypocalcemia. 5. Chronic immunosuppressive therapy secondary to lung     transplantation. 6. Diabetes mellitus type 2. 7. ACD - blood loss. 8. Coagulopathy during this admission. 9. Hypertension. 10.Metabolic acidosis this admission. 11.LVH with normal systolic function on January 06, 2011, echo. 12.History of malaria in the past.  PAST SURGICAL HISTORY: 1. Status post bilateral lung transplant at [facility] in June 2003, Dr.     Shanon Brow. 2. Status post appendectomy.  ALLERGIES:  PENICILLIN.  MEDICATIONS:  Norvasc, Os-Cal, Aranesp every 7 days, folic acid, NovoLog and Lantus as directed, Lopressor, Protonix, Maalox, Zofran, prednisone, Rapamune, Prograf, Septra, Tylenol, Ventolin,  Chloraseptic, Apresoline, sorbitol, Ambien.  At home, he is also taking valacyclovir.  Please refer to Dr. Janelle Floor discharge summary for the correct doses.  REVIEW OF SYSTEMS:  The patient denies any fever, chills, night sweats, headaches, mental status changes, vision changes, dysphagia.  He has some mild dyspnea on exertion, no productive cough or chest pain.  No palpitations.  He had prior to admission, abdominal pain, accompanied by nausea, vomiting, diarrhea, which is now resolved.  He had mild weight loss, and fatigue.  His right nostril experienced severe epistaxis on January 10, 2011, nearly at midnight, which after the administration of FFP and the discontinuation of Lovenox is  resolved.  Rest of the review of systems  is negative.  FAMILY HISTORY:  Mother died in her 61s.  She had a history of hypertension.  Father died at 8 of unknown causes.  The patient has 13 siblings, 2 deceased.  One died with abdominal aortic aneurysm and the other one with TB.  SOCIAL HISTORY:  The patient is married, he has 3 sons in good health. He lives in [place].  Full code.  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  He quit the use of tobacco for about 15 years.  No alcohol or recreational drugs.  Last colonoscopy was about 5 years ago.  PHYSICAL EXAMINATION:  GENERAL:  This is a well-developed, 70 year old white male, in no acute distress, alert and oriented x3. VITAL SIGNS:  Blood pressure 151/75, pulse 105, respirations 18, temperature 99.3, O2 saturations 94% on 3 L, weight 67.6 kg, height 68 inches. HEENT:  Normocephalic, atraumatic.  PERRLA.  Sclerae anicteric.  Oral cavity without thrush or lesions. NECK:  Supple.  No cervical or supraclavicular masses.  No axillary masses. There is a well-healed scar at the site of the transplant. CARDIOVASCULAR:  Regular rate and rhythm without murmurs, rubs, or gallops. ABDOMEN:  Soft, nontender.  Bowel sounds x4.  No hepatosplenomegaly. GU and RECTAL:  Deferred. EXTREMITIES:  No clubbing or cyanosis.  There is a trace of edema in both lower extremities.  No inguinal masses. SKIN:  Remarkable for significant and diffuse bruising in both arms, with very dry skin, and friable.  No petechial rash. NEUROLOGIC:  Nonfocal.  LABORATORY DATA:  Please refer to HPI.  Iron studies show iron of 16, TIBC 173, percent saturation 9, ferritin 0000000, folic acid greater than 20, B12 was 826.  Please refer to the HPI for PT/PTT and INR.  Sodium 139, potassium 4.6, chloride 96, CO2 of 27, BUN 55, creatinine 5.36, glucose 102. Total bilirubin 1.4, direct bilirubin 0.2, indirect bilirubin 1.2, alkaline phosphatase 87, AST 35, ALT 17, total protein 5.9, albumin  2.7, magnesium 1.7.  UA shows small blood.  Renal ultrasound on January 07, 2011, shows a 13 mm exophytic right renal cyst with slight thinning of the renal cortex.  ASSESSMENT AND PLAN:  Dr. Jamse Arn has seen and evaluated the patient and reviewed the chart. 1. 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  The     patient has received 1 unit of fresh frozen plasma with resolution     of bleeding.  Ideally, if the bleeding recurs, would treat with     DDAVP, dosed at 0.3 mcg/kg in 50 mL of normal saline over 30     minutes to improve the platelet dysfunction. 2. Coagulopathy, likely secondary to Lovenox dose  in the setting of     renal failure.  Agree with discontinuation of  lovenox and aspirin.  Check     factor Xa levels. In addition, would obtain PT and PTT, mixing     studies to rule out inhibitor and factor deficiency, although it is     doubted.  Check von Willebrand panel levels and platelet function     test. 3. Anemia, agree with Aranesp.  If the bleeding recurs, transfuse to     maintain a hematocrit of greater than 25.  Of     note, the patient did receive Feraheme while in the hospital.  Thank you very much for allowing [place] the opportunity to participate in the care of Mr. Denny.  We will follow with you.     Sharene Butters, P.A.   ______________________________ Genevie Cheshire. Masen Luallen, M.D.    SW/MEDQ  D:  01/12/2011  T:  01/13/2011  Job:  [id number]  Electronically Signed by Sharene Butters P.A. on 01/14/2011 08:46:23 AM Electronically Signed by Sophronia Simas M.D. on 01/30/2011 05:38:19 AM

## 2011-02-07 ENCOUNTER — Encounter (INDEPENDENT_AMBULATORY_CARE_PROVIDER_SITE_OTHER): Payer: Self-pay | Admitting: *Deleted

## 2011-02-07 ENCOUNTER — Encounter: Payer: Self-pay | Admitting: Pulmonary Disease

## 2011-02-07 ENCOUNTER — Other Ambulatory Visit: Payer: Medicare Other

## 2011-02-07 ENCOUNTER — Other Ambulatory Visit: Payer: Self-pay | Admitting: Pulmonary Disease

## 2011-02-07 DIAGNOSIS — T86819 Unspecified complication of lung transplant: Secondary | ICD-10-CM

## 2011-02-07 DIAGNOSIS — Z942 Lung transplant status: Secondary | ICD-10-CM

## 2011-02-07 LAB — URINALYSIS, ROUTINE W REFLEX MICROSCOPIC
Bilirubin Urine: NEGATIVE
Ketones, ur: NEGATIVE
Leukocytes, UA: NEGATIVE
Nitrite: NEGATIVE
Specific Gravity, Urine: >=1.03
Total Protein, Urine: >=300
Urine Glucose: 100
Urobilinogen, UA: 0.2
pH: 6 (ref 5.0–8.0)

## 2011-02-14 ENCOUNTER — Encounter (INDEPENDENT_AMBULATORY_CARE_PROVIDER_SITE_OTHER): Payer: Self-pay | Admitting: *Deleted

## 2011-02-14 ENCOUNTER — Other Ambulatory Visit: Payer: Medicare Other

## 2011-02-14 ENCOUNTER — Encounter: Payer: Self-pay | Admitting: Pulmonary Disease

## 2011-02-14 ENCOUNTER — Other Ambulatory Visit: Payer: Self-pay | Admitting: Pulmonary Disease

## 2011-02-14 DIAGNOSIS — T8600 Unspecified complication of bone marrow transplant: Secondary | ICD-10-CM

## 2011-02-14 DIAGNOSIS — Z79899 Other long term (current) drug therapy: Secondary | ICD-10-CM

## 2011-02-14 LAB — CBC WITH DIFFERENTIAL/PLATELET
Eosinophils Relative: 3.1 % (ref 0.0–5.0)
HCT: 27.9 % — ABNORMAL LOW (ref 39.0–52.0)
Lymphocytes Relative: 16.6 % (ref 12.0–46.0)
Lymphs Abs: 1.3 10*3/uL (ref 0.7–4.0)
Monocytes Relative: 11.5 % (ref 3.0–12.0)
Neutrophils Relative %: 68.4 % (ref 43.0–77.0)
Platelets: 145 10*3/uL — ABNORMAL LOW (ref 150.0–400.0)
WBC: 7.8 10*3/uL (ref 4.5–10.5)

## 2011-02-14 LAB — BASIC METABOLIC PANEL
BUN: 33 mg/dL — ABNORMAL HIGH (ref 6–23)
Calcium: 7.8 mg/dL — ABNORMAL LOW (ref 8.4–10.5)
GFR: 27.97 mL/min — ABNORMAL LOW (ref >60.00)
Potassium: 3.2 mEq/L — ABNORMAL LOW (ref 3.5–5.1)

## 2011-02-14 LAB — MAGNESIUM: Magnesium: 1.6 mg/dL (ref 1.5–2.5)

## 2011-02-23 ENCOUNTER — Other Ambulatory Visit: Payer: Self-pay | Admitting: Dermatology

## 2011-03-15 ENCOUNTER — Ambulatory Visit: Payer: Self-pay | Admitting: Otolaryngology

## 2011-03-17 ENCOUNTER — Ambulatory Visit: Payer: Self-pay | Admitting: Otolaryngology

## 2011-03-21 LAB — PATHOLOGY REPORT

## 2011-03-23 ENCOUNTER — Other Ambulatory Visit (INDEPENDENT_AMBULATORY_CARE_PROVIDER_SITE_OTHER): Payer: Medicare Other

## 2011-03-23 ENCOUNTER — Other Ambulatory Visit: Payer: Self-pay | Admitting: Pulmonary Disease

## 2011-03-23 DIAGNOSIS — T86819 Unspecified complication of lung transplant: Secondary | ICD-10-CM

## 2011-03-23 LAB — CBC WITH DIFFERENTIAL/PLATELET
Basophils Absolute: 0.1 10*3/uL (ref 0.0–0.1)
Basophils Relative: 1.1 % (ref 0.0–3.0)
Eosinophils Absolute: 0.2 10*3/uL (ref 0.0–0.7)
Eosinophils Relative: 2.2 % (ref 0.0–5.0)
HCT: 31.4 % — ABNORMAL LOW (ref 39.0–52.0)
Hemoglobin: 10.5 g/dL — ABNORMAL LOW (ref 13.0–17.0)
Lymphocytes Relative: 14 % (ref 12.0–46.0)
Lymphs Abs: 1 10*3/uL (ref 0.7–4.0)
MCHC: 33.6 g/dL (ref 30.0–36.0)
MCV: 92 fl (ref 78.0–100.0)
Monocytes Absolute: 1.1 10*3/uL — ABNORMAL HIGH (ref 0.1–1.0)
Monocytes Relative: 15.1 % — ABNORMAL HIGH (ref 3.0–12.0)
Neutro Abs: 4.9 10*3/uL (ref 1.4–7.7)
Neutrophils Relative %: 67.6 % (ref 43.0–77.0)
Platelets: 163 10*3/uL (ref 150.0–400.0)
RBC: 3.41 Mil/uL — ABNORMAL LOW (ref 4.22–5.81)
RDW: 17.4 % — ABNORMAL HIGH (ref 11.5–14.6)
WBC: 7.3 10*3/uL (ref 4.5–10.5)

## 2011-03-23 LAB — BASIC METABOLIC PANEL
CO2: 20 mEq/L (ref 19–32)
Chloride: 111 mEq/L (ref 96–112)
Potassium: 3.7 mEq/L (ref 3.5–5.1)
Sodium: 143 mEq/L (ref 135–145)

## 2011-04-25 ENCOUNTER — Other Ambulatory Visit (INDEPENDENT_AMBULATORY_CARE_PROVIDER_SITE_OTHER): Payer: Medicare Other

## 2011-04-25 DIAGNOSIS — T86819 Unspecified complication of lung transplant: Secondary | ICD-10-CM

## 2011-04-25 LAB — CBC WITH DIFFERENTIAL/PLATELET
Basophils Absolute: 0 10*3/uL (ref 0.0–0.1)
Basophils Relative: 0.3 % (ref 0.0–3.0)
Eosinophils Absolute: 0.2 10*3/uL (ref 0.0–0.7)
Hemoglobin: 11.2 g/dL — ABNORMAL LOW (ref 13.0–17.0)
Lymphocytes Relative: 16 % (ref 12.0–46.0)
MCHC: 34.2 g/dL (ref 30.0–36.0)
MCV: 91.8 fl (ref 78.0–100.0)
Monocytes Absolute: 1.1 10*3/uL — ABNORMAL HIGH (ref 0.1–1.0)
Neutro Abs: 5.4 10*3/uL (ref 1.4–7.7)
RBC: 3.57 Mil/uL — ABNORMAL LOW (ref 4.22–5.81)
RDW: 15.4 % — ABNORMAL HIGH (ref 11.5–14.6)

## 2011-04-25 LAB — BASIC METABOLIC PANEL
CO2: 17 mEq/L — ABNORMAL LOW (ref 19–32)
Calcium: 8.2 mg/dL — ABNORMAL LOW (ref 8.4–10.5)
Chloride: 113 mEq/L — ABNORMAL HIGH (ref 96–112)
Creatinine, Ser: 3.2 mg/dL — ABNORMAL HIGH (ref 0.4–1.5)
Glucose, Bld: 110 mg/dL — ABNORMAL HIGH (ref 70–99)
Sodium: 142 mEq/L (ref 135–145)

## 2011-05-09 ENCOUNTER — Other Ambulatory Visit: Payer: Self-pay | Admitting: Pulmonary Disease

## 2011-05-09 ENCOUNTER — Other Ambulatory Visit (INDEPENDENT_AMBULATORY_CARE_PROVIDER_SITE_OTHER): Payer: Medicare Other

## 2011-05-09 ENCOUNTER — Other Ambulatory Visit: Payer: Self-pay | Admitting: *Deleted

## 2011-05-09 DIAGNOSIS — N184 Chronic kidney disease, stage 4 (severe): Secondary | ICD-10-CM

## 2011-05-09 DIAGNOSIS — T86819 Unspecified complication of lung transplant: Secondary | ICD-10-CM

## 2011-05-09 DIAGNOSIS — E559 Vitamin D deficiency, unspecified: Secondary | ICD-10-CM

## 2011-05-09 LAB — CBC WITH DIFFERENTIAL/PLATELET
Basophils Absolute: 0 10*3/uL (ref 0.0–0.1)
Eosinophils Absolute: 0.2 10*3/uL (ref 0.0–0.7)
Hemoglobin: 10.9 g/dL — ABNORMAL LOW (ref 13.0–17.0)
Lymphocytes Relative: 22.1 % (ref 12.0–46.0)
MCHC: 33.2 g/dL (ref 30.0–36.0)
Monocytes Relative: 13.9 % — ABNORMAL HIGH (ref 3.0–12.0)
Neutro Abs: 3.5 10*3/uL (ref 1.4–7.7)
Neutrophils Relative %: 59.9 % (ref 43.0–77.0)
Platelets: 239 10*3/uL (ref 150.0–400.0)
RDW: 15.6 % — ABNORMAL HIGH (ref 11.5–14.6)

## 2011-05-09 LAB — BASIC METABOLIC PANEL
BUN: 55 mg/dL — ABNORMAL HIGH (ref 6–23)
CO2: 12 mEq/L — ABNORMAL LOW (ref 19–32)
Calcium: 8.2 mg/dL — ABNORMAL LOW (ref 8.4–10.5)
Creatinine, Ser: 4.2 mg/dL — ABNORMAL HIGH (ref 0.4–1.5)
GFR: 15.16 mL/min — ABNORMAL LOW (ref >60.00)
Glucose, Bld: 111 mg/dL — ABNORMAL HIGH (ref 70–99)
Sodium: 142 mEq/L (ref 135–145)

## 2011-05-09 LAB — MAGNESIUM: Magnesium: 2.1 mg/dL (ref 1.5–2.5)

## 2011-05-10 LAB — PTH, INTACT AND CALCIUM
Calcium, Total (PTH): 8.5 mg/dL (ref 8.4–10.5)
PTH: 175.9 pg/mL — ABNORMAL HIGH (ref 14.0–72.0)

## 2011-05-10 LAB — TACROLIMUS LEVEL: Tacrolimus Lvl: 11 ng/mL (ref 5.0–20.0)

## 2011-05-27 ENCOUNTER — Other Ambulatory Visit: Payer: Self-pay | Admitting: Pulmonary Disease

## 2011-05-27 ENCOUNTER — Other Ambulatory Visit (INDEPENDENT_AMBULATORY_CARE_PROVIDER_SITE_OTHER): Payer: Medicare Other

## 2011-05-27 DIAGNOSIS — T86819 Unspecified complication of lung transplant: Secondary | ICD-10-CM

## 2011-05-27 LAB — CBC WITH DIFFERENTIAL/PLATELET
Basophils Absolute: 0 10*3/uL (ref 0.0–0.1)
HCT: 26.5 % — ABNORMAL LOW (ref 39.0–52.0)
Lymphs Abs: 1.3 10*3/uL (ref 0.7–4.0)
Monocytes Absolute: 1.1 10*3/uL — ABNORMAL HIGH (ref 0.1–1.0)
Monocytes Relative: 12.4 % — ABNORMAL HIGH (ref 3.0–12.0)
Neutrophils Relative %: 69.9 % (ref 43.0–77.0)
Platelets: 193 10*3/uL (ref 150.0–400.0)
RDW: 15.5 % — ABNORMAL HIGH (ref 11.5–14.6)
WBC: 8.8 10*3/uL (ref 4.5–10.5)

## 2011-05-27 LAB — BASIC METABOLIC PANEL
BUN: 55 mg/dL — ABNORMAL HIGH (ref 6–23)
Creatinine, Ser: 3.2 mg/dL — ABNORMAL HIGH (ref 0.4–1.5)
GFR: 20.48 mL/min — ABNORMAL LOW (ref >60.00)
Glucose, Bld: 159 mg/dL — ABNORMAL HIGH (ref 70–99)
Potassium: 3.7 mEq/L (ref 3.5–5.1)

## 2011-05-28 LAB — TACROLIMUS LEVEL: Tacrolimus Lvl: 5.6 ng/mL (ref 5.0–20.0)

## 2011-05-30 LAB — SIROLIMUS LEVEL: Sirolimus (Rapamycin): 9.7 ng/mL

## 2011-06-10 ENCOUNTER — Other Ambulatory Visit (INDEPENDENT_AMBULATORY_CARE_PROVIDER_SITE_OTHER): Payer: Medicare Other

## 2011-06-10 DIAGNOSIS — T86819 Unspecified complication of lung transplant: Secondary | ICD-10-CM

## 2011-06-10 LAB — CBC WITH DIFFERENTIAL/PLATELET
Basophils Absolute: 0 10*3/uL (ref 0.0–0.1)
Basophils Relative: 0.3 % (ref 0.0–3.0)
Eosinophils Absolute: 0.2 10*3/uL (ref 0.0–0.7)
HCT: 29.6 % — ABNORMAL LOW (ref 39.0–52.0)
Hemoglobin: 10 g/dL — ABNORMAL LOW (ref 13.0–17.0)
Lymphs Abs: 1.4 10*3/uL (ref 0.7–4.0)
MCHC: 33.6 g/dL (ref 30.0–36.0)
MCV: 90.4 fl (ref 78.0–100.0)
Monocytes Absolute: 0.8 10*3/uL (ref 0.1–1.0)
Neutro Abs: 6.3 10*3/uL (ref 1.4–7.7)
RDW: 15.7 % — ABNORMAL HIGH (ref 11.5–14.6)

## 2011-06-10 LAB — BASIC METABOLIC PANEL
BUN: 50 mg/dL — ABNORMAL HIGH (ref 6–23)
Creatinine, Ser: 2.9 mg/dL — ABNORMAL HIGH (ref 0.4–1.5)
GFR: 22.75 mL/min — ABNORMAL LOW (ref >60.00)

## 2011-06-24 ENCOUNTER — Other Ambulatory Visit (INDEPENDENT_AMBULATORY_CARE_PROVIDER_SITE_OTHER): Payer: Medicare Other

## 2011-06-24 DIAGNOSIS — T86819 Unspecified complication of lung transplant: Secondary | ICD-10-CM

## 2011-06-24 LAB — CBC WITH DIFFERENTIAL/PLATELET
Basophils Absolute: 0 10*3/uL (ref 0.0–0.1)
Eosinophils Absolute: 0.2 10*3/uL (ref 0.0–0.7)
Lymphocytes Relative: 14.4 % (ref 12.0–46.0)
Lymphs Abs: 1.2 10*3/uL (ref 0.7–4.0)
MCHC: 33.9 g/dL (ref 30.0–36.0)
Monocytes Relative: 6.3 % (ref 3.0–12.0)
Neutro Abs: 6.2 10*3/uL (ref 1.4–7.7)
Platelets: 128 10*3/uL — ABNORMAL LOW (ref 150.0–400.0)
RDW: 15.9 % — ABNORMAL HIGH (ref 11.5–14.6)

## 2011-06-24 LAB — BASIC METABOLIC PANEL
BUN: 47 mg/dL — ABNORMAL HIGH (ref 6–23)
CO2: 15 mEq/L — ABNORMAL LOW (ref 19–32)
Calcium: 8.5 mg/dL (ref 8.4–10.5)
GFR: 21.08 mL/min — ABNORMAL LOW (ref >60.00)
Glucose, Bld: 121 mg/dL — ABNORMAL HIGH (ref 70–99)

## 2011-06-25 LAB — TACROLIMUS LEVEL: Tacrolimus Lvl: 5.1 ng/mL (ref 5.0–20.0)

## 2011-07-18 ENCOUNTER — Other Ambulatory Visit (INDEPENDENT_AMBULATORY_CARE_PROVIDER_SITE_OTHER): Payer: Medicare Other

## 2011-07-18 DIAGNOSIS — T86819 Unspecified complication of lung transplant: Secondary | ICD-10-CM

## 2011-07-18 LAB — BASIC METABOLIC PANEL
BUN: 57 mg/dL — ABNORMAL HIGH (ref 6–23)
CO2: 14 mEq/L — ABNORMAL LOW (ref 19–32)
Calcium: 8.5 mg/dL (ref 8.4–10.5)
Chloride: 116 mEq/L — ABNORMAL HIGH (ref 96–112)
Creatinine, Ser: 3.1 mg/dL — ABNORMAL HIGH (ref 0.4–1.5)
GFR: 21.08 mL/min — ABNORMAL LOW (ref >60.00)
Glucose, Bld: 146 mg/dL — ABNORMAL HIGH (ref 70–99)
Potassium: 3.9 mEq/L (ref 3.5–5.1)
Sodium: 140 mEq/L (ref 135–145)

## 2011-07-18 LAB — CBC WITH DIFFERENTIAL/PLATELET
Basophils Absolute: 0 10*3/uL (ref 0.0–0.1)
Basophils Relative: 0.2 % (ref 0.0–3.0)
Eosinophils Absolute: 0.2 10*3/uL (ref 0.0–0.7)
Eosinophils Relative: 1.8 % (ref 0.0–5.0)
HCT: 28.9 % — ABNORMAL LOW (ref 39.0–52.0)
Hemoglobin: 9.8 g/dL — ABNORMAL LOW (ref 13.0–17.0)
Lymphocytes Relative: 10.9 % — ABNORMAL LOW (ref 12.0–46.0)
Lymphs Abs: 1 10*3/uL (ref 0.7–4.0)
MCHC: 33.8 g/dL (ref 30.0–36.0)
MCV: 91 fl (ref 78.0–100.0)
Monocytes Absolute: 0.1 10*3/uL (ref 0.1–1.0)
Monocytes Relative: 1.3 % — ABNORMAL LOW (ref 3.0–12.0)
Neutro Abs: 8.1 10*3/uL — ABNORMAL HIGH (ref 1.4–7.7)
Neutrophils Relative %: 85.8 % — ABNORMAL HIGH (ref 43.0–77.0)
Platelets: 157 10*3/uL (ref 150.0–400.0)
RBC: 3.18 Mil/uL — ABNORMAL LOW (ref 4.22–5.81)
RDW: 15.8 % — ABNORMAL HIGH (ref 11.5–14.6)
WBC: 9.4 10*3/uL (ref 4.5–10.5)

## 2011-07-18 LAB — MAGNESIUM: Magnesium: 1.9 mg/dL (ref 1.5–2.5)

## 2011-08-27 DIAGNOSIS — D849 Immunodeficiency, unspecified: Secondary | ICD-10-CM | POA: Insufficient documentation

## 2011-10-10 ENCOUNTER — Other Ambulatory Visit: Payer: Self-pay | Admitting: Dermatology

## 2012-02-07 ENCOUNTER — Other Ambulatory Visit: Payer: Self-pay | Admitting: Dermatology

## 2012-08-09 IMAGING — US US ABDOMEN COMPLETE
1 series · 13 of 25 positions shown · non-contrast
Comparison: Renal ultrasound 01/07/2011.
COMPARISON: None.

CLINICAL DATA: History of bilateral lung transplant.
Coagulopathy.

COMPLETE ABDOMINAL ULTRASOUND
TECHNIQUE: Color and duplex Doppler ultrasound was performed to
evaluate the hepatic in-flow and out-flow vessels.

[Series 1: us abdomen complete · 0.28mm/px · 13 of 77 slices shown]
[im 1/77]
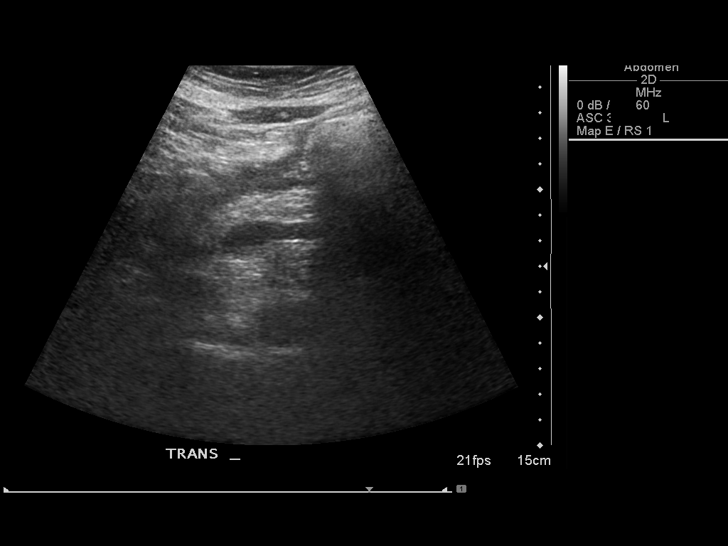
[im 7/77]
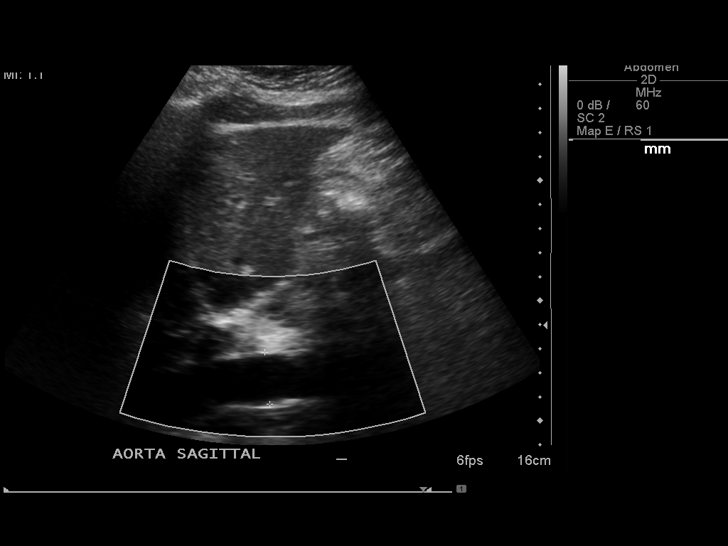
[im 13/77]
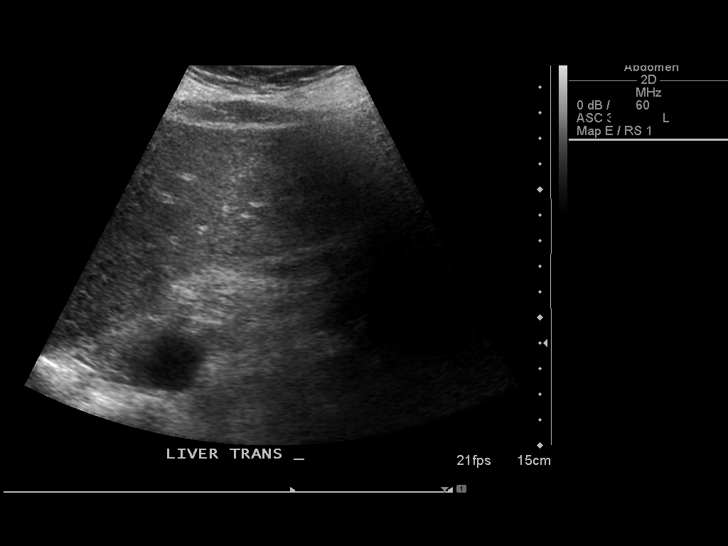
[im 20/77]
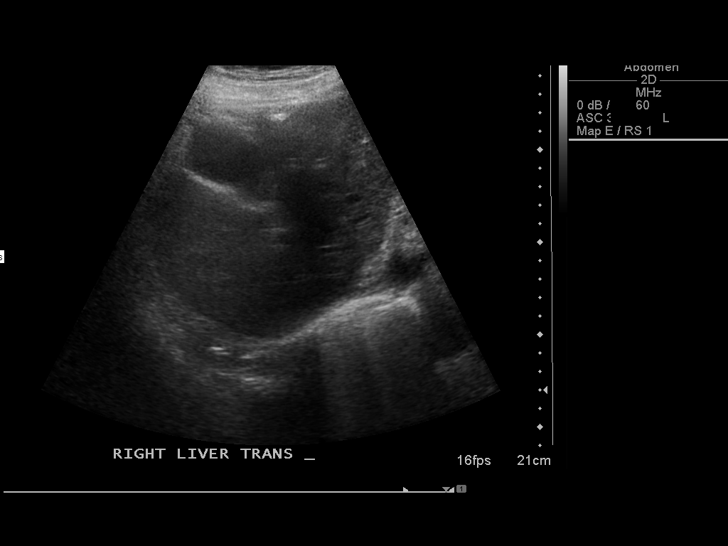
[im 26/77]
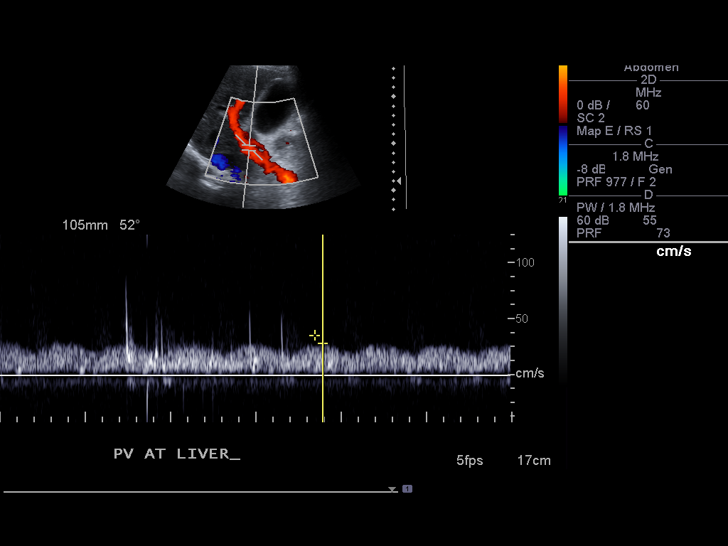
[im 32/77]
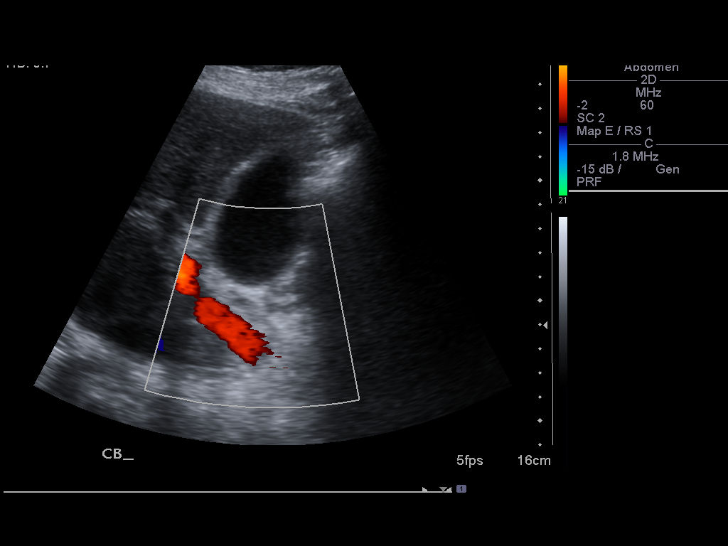
[im 39/77]
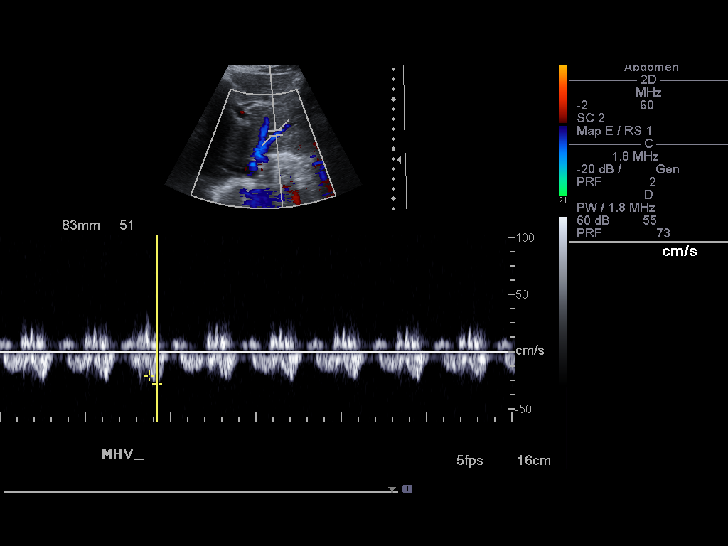
[im 45/77]
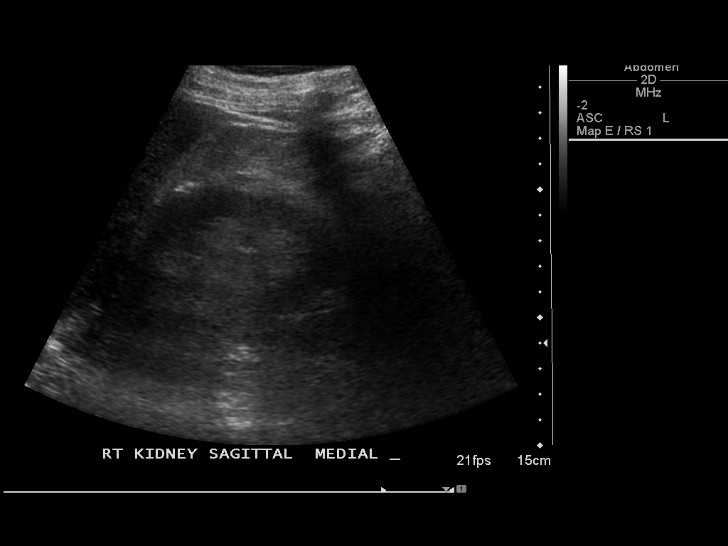
[im 51/77]
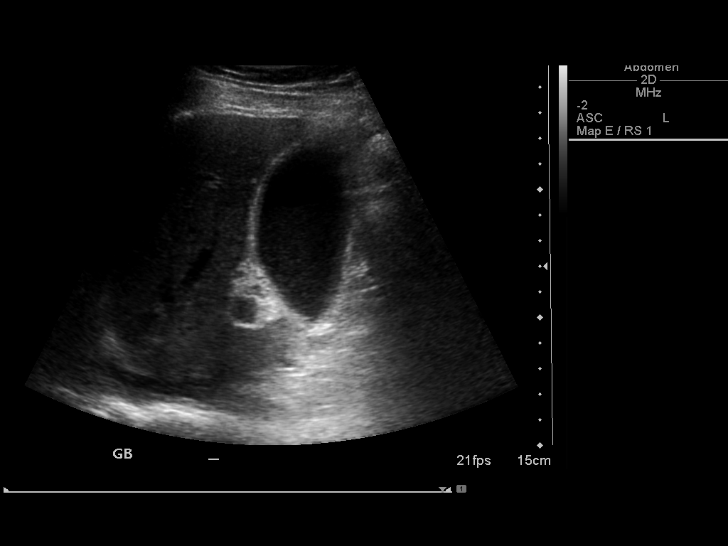
[im 58/77]
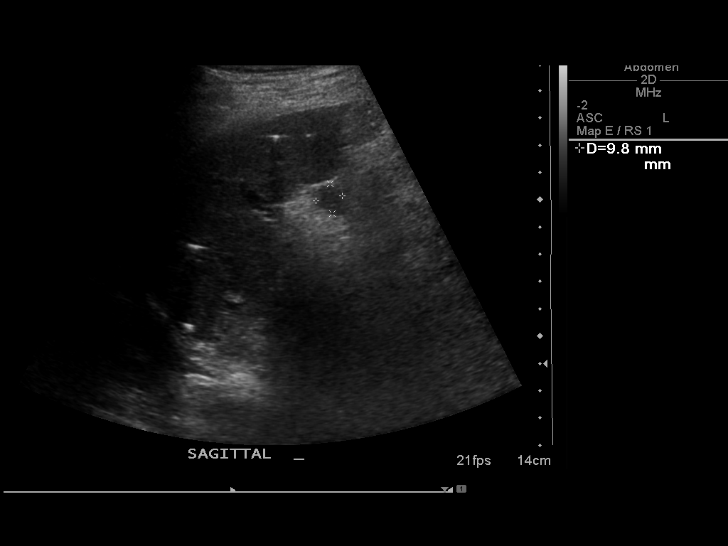
[im 64/77]
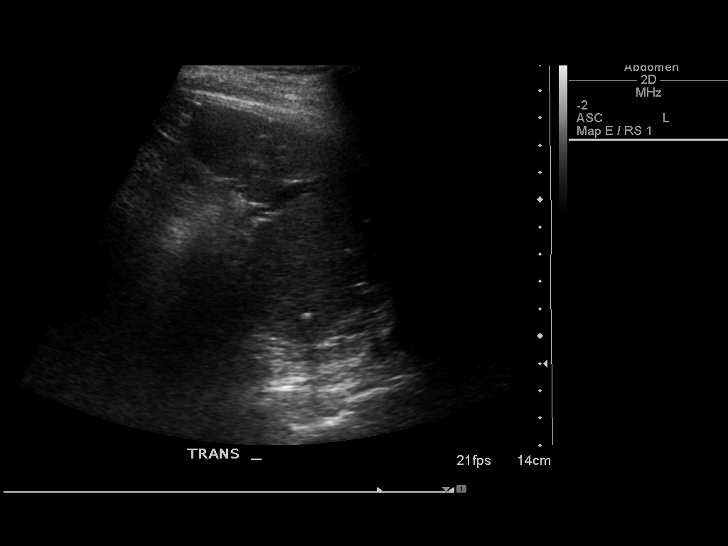
[im 70/77]
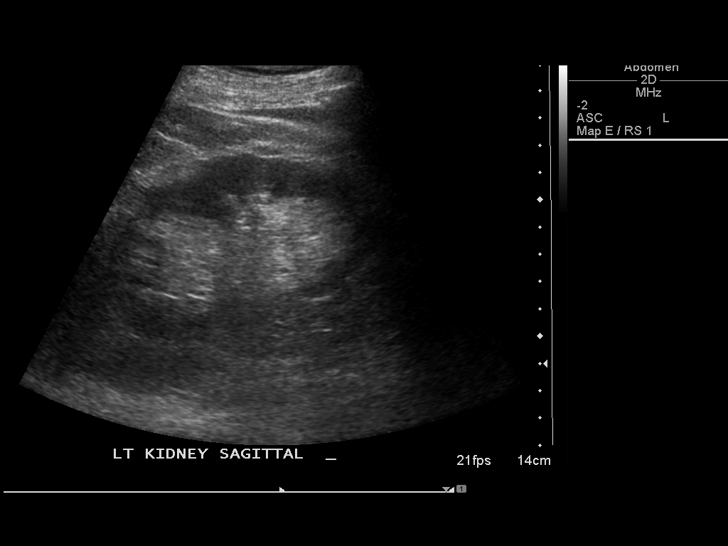
[im 77/77]
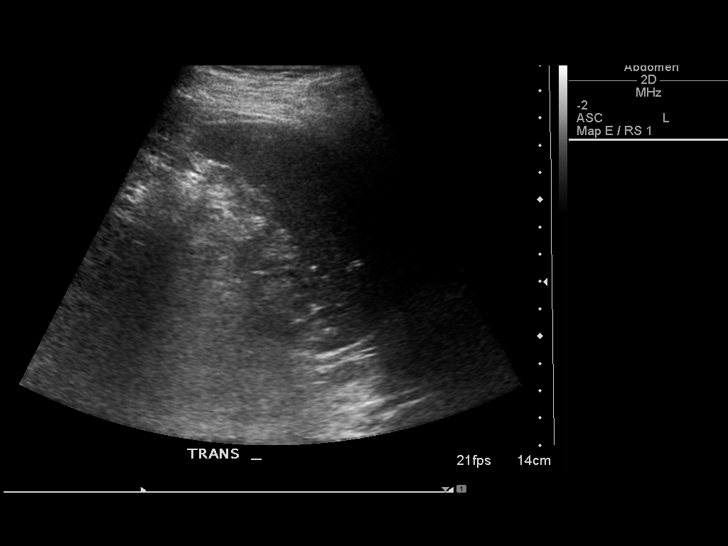

[13 of 25 positions shown; findings below may reference images not displayed]

FINDINGS: Gallbladder: There is sludge throughout the gallbladder lumen.  No
gallstones are identified.  There is mild gallbladder wall
thickening to 5.3 mm.  No sonographic Murphy's sign is present

Common bile duct:   Normal in caliber without filling defects.

Liver:  Echogenicity is within normal limits.  No focal hepatic
abnormalities are identified.

IVC:  Visualized portions appear unremarkable.

Pancreas:  Visualized portions appear unremarkable.There is limited
visualization of the pancreatic tail due to overlying bowel gas.

Spleen:  Visualized portions appear unremarkable aside from
probable small calcifications.

Right Kidney:  There is mild renal cortical thinning.  There is a
small cyst superiorly measuring 11 mm in greatest diameter.  There
is no hydronephrosis.  Renal length is 9.9 cm.

Left Kidney: There is renal cortical thinning without focal
abnormality or hydronephrosis.   Renal length is 10.6 cm.

Abdominal aorta:  Visualized portions appear unremarkable.

Minimal ascites is demonstrated.
IMPRESSION: 1.  Gallbladder sludge with nonspecific gallbladder wall
thickening, possibly related to liver disease and ascites.
Negative sonographic Murphy's sign.
2.  Stable appearance of the kidneys without hydronephrosis.
3.  Minimal ascites.

DUPLEX ULTRASOUND OF LIVER
Portal Vein Velocities:
Main:      28.0 cm/sec
Right:      12.2 cm/sec
Left:        13.1 cm/sec

Hepatic Vein Velocities:
Right:     16.9 cm/sec
Middle:  35.3 cm/sec
Left:       28.6 cm/sec

Hepatic Artery Velocity:   86.0 cm/sec
Splenic Vein patent, measuring 3.1 cm in diameter.

Varices:   None visualized.
Ascites:   Minimal
IMPRESSION: 1.  No evidence of hepatic vascular occlusion or reversal of blood
flow.
2.  Velocity measurements provided above.

## 2013-04-17 ENCOUNTER — Other Ambulatory Visit: Payer: Self-pay | Admitting: Dermatology

## 2014-04-20 ENCOUNTER — Emergency Department (HOSPITAL_COMMUNITY): Payer: Medicare Other

## 2014-04-20 ENCOUNTER — Inpatient Hospital Stay (HOSPITAL_COMMUNITY)
Admission: EM | Admit: 2014-04-20 | Discharge: 2014-04-28 | DRG: 371 | Disposition: A | Discharge status: Home / Self Care | Payer: Medicare Other | Attending: Oncology | Admitting: Oncology

## 2014-04-20 DIAGNOSIS — N2581 Secondary hyperparathyroidism of renal origin: Secondary | ICD-10-CM | POA: Diagnosis present

## 2014-04-20 DIAGNOSIS — N186 End stage renal disease: Secondary | ICD-10-CM | POA: Diagnosis present

## 2014-04-20 DIAGNOSIS — R630 Anorexia: Secondary | ICD-10-CM | POA: Diagnosis not present

## 2014-04-20 DIAGNOSIS — K5792 Diverticulitis of intestine, part unspecified, without perforation or abscess without bleeding: Secondary | ICD-10-CM

## 2014-04-20 DIAGNOSIS — Z79899 Other long term (current) drug therapy: Secondary | ICD-10-CM

## 2014-04-20 DIAGNOSIS — J479 Bronchiectasis, uncomplicated: Secondary | ICD-10-CM | POA: Diagnosis present

## 2014-04-20 DIAGNOSIS — M62830 Muscle spasm of back: Secondary | ICD-10-CM | POA: Diagnosis present

## 2014-04-20 DIAGNOSIS — M62838 Other muscle spasm: Secondary | ICD-10-CM | POA: Diagnosis present

## 2014-04-20 DIAGNOSIS — Z942 Lung transplant status: Secondary | ICD-10-CM

## 2014-04-20 DIAGNOSIS — D899 Disorder involving the immune mechanism, unspecified: Secondary | ICD-10-CM

## 2014-04-20 DIAGNOSIS — T50995A Adverse effect of other drugs, medicaments and biological substances, initial encounter: Secondary | ICD-10-CM | POA: Diagnosis present

## 2014-04-20 DIAGNOSIS — K630 Abscess of intestine: Principal | ICD-10-CM | POA: Diagnosis present

## 2014-04-20 DIAGNOSIS — E875 Hyperkalemia: Secondary | ICD-10-CM | POA: Diagnosis present

## 2014-04-20 DIAGNOSIS — Z87891 Personal history of nicotine dependence: Secondary | ICD-10-CM

## 2014-04-20 DIAGNOSIS — N185 Chronic kidney disease, stage 5: Secondary | ICD-10-CM

## 2014-04-20 DIAGNOSIS — Z880 Allergy status to penicillin: Secondary | ICD-10-CM

## 2014-04-20 DIAGNOSIS — K5732 Diverticulitis of large intestine without perforation or abscess without bleeding: Secondary | ICD-10-CM

## 2014-04-20 DIAGNOSIS — G8929 Other chronic pain: Secondary | ICD-10-CM | POA: Diagnosis present

## 2014-04-20 DIAGNOSIS — I120 Hypertensive chronic kidney disease with stage 5 chronic kidney disease or end stage renal disease: Secondary | ICD-10-CM | POA: Diagnosis present

## 2014-04-20 DIAGNOSIS — A0472 Enterocolitis due to Clostridium difficile, not specified as recurrent: Secondary | ICD-10-CM | POA: Diagnosis not present

## 2014-04-20 DIAGNOSIS — D649 Anemia, unspecified: Secondary | ICD-10-CM | POA: Diagnosis present

## 2014-04-20 DIAGNOSIS — A498 Other bacterial infections of unspecified site: Secondary | ICD-10-CM

## 2014-04-20 DIAGNOSIS — T86819 Unspecified complication of lung transplant: Secondary | ICD-10-CM | POA: Diagnosis present

## 2014-04-20 DIAGNOSIS — E1142 Type 2 diabetes mellitus with diabetic polyneuropathy: Secondary | ICD-10-CM | POA: Diagnosis present

## 2014-04-20 DIAGNOSIS — R52 Pain, unspecified: Secondary | ICD-10-CM | POA: Diagnosis present

## 2014-04-20 DIAGNOSIS — E119 Type 2 diabetes mellitus without complications: Secondary | ICD-10-CM | POA: Diagnosis present

## 2014-04-20 DIAGNOSIS — B029 Zoster without complications: Secondary | ICD-10-CM | POA: Diagnosis not present

## 2014-04-20 DIAGNOSIS — Z794 Long term (current) use of insulin: Secondary | ICD-10-CM

## 2014-04-20 DIAGNOSIS — K219 Gastro-esophageal reflux disease without esophagitis: Secondary | ICD-10-CM | POA: Diagnosis present

## 2014-04-20 DIAGNOSIS — F411 Generalized anxiety disorder: Secondary | ICD-10-CM | POA: Diagnosis present

## 2014-04-20 DIAGNOSIS — N400 Enlarged prostate without lower urinary tract symptoms: Secondary | ICD-10-CM | POA: Diagnosis present

## 2014-04-20 DIAGNOSIS — Z7982 Long term (current) use of aspirin: Secondary | ICD-10-CM

## 2014-04-20 DIAGNOSIS — T451X5A Adverse effect of antineoplastic and immunosuppressive drugs, initial encounter: Secondary | ICD-10-CM | POA: Diagnosis present

## 2014-04-20 HISTORY — DX: Diverticulitis of intestine, part unspecified, without perforation or abscess without bleeding: K57.92

## 2014-04-20 HISTORY — DX: Chronic kidney disease, unspecified: N18.9

## 2014-04-20 HISTORY — DX: Muscle spasm of back: M62.830

## 2014-04-20 HISTORY — DX: Lung transplant status: Z94.2

## 2014-04-20 HISTORY — DX: Essential (primary) hypertension: I10

## 2014-04-20 HISTORY — DX: Unspecified malignant neoplasm of skin of unspecified part of face: C44.300

## 2014-04-20 LAB — GLUCOSE, CAPILLARY
Glucose-Capillary: 176 mg/dL — ABNORMAL HIGH (ref 70–99)
Glucose-Capillary: 107 mg/dL — ABNORMAL HIGH (ref 70–99)

## 2014-04-20 LAB — COMPREHENSIVE METABOLIC PANEL
ALBUMIN: 3.7 g/dL (ref 3.5–5.2)
ALK PHOS: 61 U/L (ref 39–117)
ALT: 10 U/L (ref 0–53)
AST: 17 U/L (ref 0–37)
BUN: 61 mg/dL — AB (ref 6–23)
CO2: 25 mEq/L (ref 19–32)
Calcium: 10.5 mg/dL (ref 8.4–10.5)
Chloride: 99 mEq/L (ref 96–112)
Creatinine, Ser: 5.58 mg/dL — ABNORMAL HIGH (ref 0.50–1.35)
GFR calc Af Amer: 11 mL/min — ABNORMAL LOW (ref >90)
GFR, EST NON AFRICAN AMERICAN: 9 mL/min — AB (ref >90)
Glucose, Bld: 154 mg/dL — ABNORMAL HIGH (ref 70–99)
POTASSIUM: 5 meq/L (ref 3.7–5.3)
Sodium: 141 mEq/L (ref 137–147)
Total Bilirubin: 0.5 mg/dL (ref 0.3–1.2)
Total Protein: 7.6 g/dL (ref 6.0–8.3)

## 2014-04-20 LAB — CBC WITH DIFFERENTIAL/PLATELET
BASOS PCT: 0 % (ref 0–1)
Basophils Absolute: 0 10*3/uL (ref 0.0–0.1)
Eosinophils Absolute: 0.1 10*3/uL (ref 0.0–0.7)
Eosinophils Relative: 1 % (ref 0–5)
HCT: 32.4 % — ABNORMAL LOW (ref 39.0–52.0)
HEMOGLOBIN: 11 g/dL — AB (ref 13.0–17.0)
LYMPHS ABS: 1.1 10*3/uL (ref 0.7–4.0)
Lymphocytes Relative: 11 % — ABNORMAL LOW (ref 12–46)
MCH: 33 pg (ref 26.0–34.0)
MCHC: 34 g/dL (ref 30.0–36.0)
MCV: 97.3 fL (ref 78.0–100.0)
Monocytes Absolute: 1 10*3/uL (ref 0.1–1.0)
Monocytes Relative: 10 % (ref 3–12)
NEUTROS ABS: 7.7 10*3/uL (ref 1.7–7.7)
NEUTROS PCT: 78 % — AB (ref 43–77)
Platelets: 156 10*3/uL (ref 150–400)
RBC: 3.33 MIL/uL — ABNORMAL LOW (ref 4.22–5.81)
RDW: 14.5 % (ref 11.5–15.5)
WBC: 10 10*3/uL (ref 4.0–10.5)

## 2014-04-20 LAB — URINALYSIS, ROUTINE W REFLEX MICROSCOPIC
Bilirubin Urine: NEGATIVE
GLUCOSE, UA: 100 mg/dL — AB
KETONES UR: NEGATIVE mg/dL
Leukocytes, UA: NEGATIVE
Nitrite: NEGATIVE
PROTEIN: 100 mg/dL — AB
Specific Gravity, Urine: 1.015 (ref 1.005–1.030)
UROBILINOGEN UA: 0.2 mg/dL (ref 0.0–1.0)
pH: 7 (ref 5.0–8.0)

## 2014-04-20 LAB — URINE MICROSCOPIC-ADD ON

## 2014-04-20 LAB — MAGNESIUM: MAGNESIUM: 2.1 mg/dL (ref 1.5–2.5)

## 2014-04-20 LAB — PHOSPHORUS: Phosphorus: 4.9 mg/dL — ABNORMAL HIGH (ref 2.3–4.6)

## 2014-04-20 MED ORDER — HYDROMORPHONE HCL PF 1 MG/ML IJ SOLN
1.0000 mg | Freq: Once | INTRAMUSCULAR | Status: AC
  Administered 2014-04-20: 1 mg via INTRAVENOUS
  Filled 2014-04-20: qty 1

## 2014-04-20 MED ORDER — METRONIDAZOLE IN NACL 5-0.79 MG/ML-% IV SOLN
500.0000 mg | Freq: Three times a day (TID) | INTRAVENOUS | Status: DC
  Administered 2014-04-20: 500 mg via INTRAVENOUS
  Filled 2014-04-20 (×2): qty 100

## 2014-04-20 MED ORDER — CARVEDILOL 25 MG PO TABS
25.0000 mg | ORAL_TABLET | Freq: Two times a day (BID) | ORAL | Status: DC
  Administered 2014-04-20 – 2014-04-28 (×16): 25 mg via ORAL
  Filled 2014-04-20 (×19): qty 1

## 2014-04-20 MED ORDER — ACETAMINOPHEN 650 MG RE SUPP: 650.0000 mg | Freq: Four times a day (QID) | RECTAL | Status: DC | PRN

## 2014-04-20 MED ORDER — CALCIUM ACETATE 667 MG PO CAPS
667.0000 mg | ORAL_CAPSULE | Freq: Two times a day (BID) | ORAL | Status: DC
  Administered 2014-04-20 – 2014-04-24 (×8): 667 mg via ORAL
  Filled 2014-04-20 (×11): qty 1

## 2014-04-20 MED ORDER — SODIUM CHLORIDE 0.9 % IJ SOLN
3.0000 mL | Freq: Two times a day (BID) | INTRAMUSCULAR | Status: DC
  Administered 2014-04-21 – 2014-04-26 (×11): 3 mL via INTRAVENOUS

## 2014-04-20 MED ORDER — HYDROMORPHONE HCL PF 1 MG/ML IJ SOLN
1.0000 mg | INTRAMUSCULAR | Status: DC | PRN
  Administered 2014-04-20: 1 mg via INTRAVENOUS
  Filled 2014-04-20: qty 1

## 2014-04-20 MED ORDER — INSULIN ASPART 100 UNIT/ML ~~LOC~~ SOLN
0.0000 [IU] | Freq: Three times a day (TID) | SUBCUTANEOUS | Status: DC
  Administered 2014-04-20: 2 [IU] via SUBCUTANEOUS
  Administered 2014-04-21 (×2): 1 [IU] via SUBCUTANEOUS
  Administered 2014-04-22: 2 [IU] via SUBCUTANEOUS
  Administered 2014-04-22 (×2): 3 [IU] via SUBCUTANEOUS
  Administered 2014-04-23 (×2): 2 [IU] via SUBCUTANEOUS
  Administered 2014-04-23: 3 [IU] via SUBCUTANEOUS
  Administered 2014-04-24 (×2): 5 [IU] via SUBCUTANEOUS
  Administered 2014-04-24 – 2014-04-25 (×3): 3 [IU] via SUBCUTANEOUS
  Administered 2014-04-25: 13:00:00 via SUBCUTANEOUS

## 2014-04-20 MED ORDER — ONDANSETRON HCL 4 MG PO TABS
4.0000 mg | ORAL_TABLET | Freq: Four times a day (QID) | ORAL | Status: DC | PRN
  Administered 2014-04-22: 4 mg via ORAL
  Filled 2014-04-20: qty 1

## 2014-04-20 MED ORDER — SODIUM CHLORIDE 0.9 % IV SOLN: INTRAVENOUS | Status: DC

## 2014-04-20 MED ORDER — CIPROFLOXACIN IN D5W 400 MG/200ML IV SOLN: 400.0000 mg | INTRAVENOUS | Status: DC

## 2014-04-20 MED ORDER — TACROLIMUS 1 MG PO CAPS
1.0000 mg | ORAL_CAPSULE | Freq: Every day | ORAL | Status: DC
  Administered 2014-04-20 – 2014-04-23 (×4): 1 mg via ORAL
  Filled 2014-04-20 (×4): qty 1

## 2014-04-20 MED ORDER — CIPROFLOXACIN IN D5W 400 MG/200ML IV SOLN
400.0000 mg | INTRAVENOUS | Status: DC
  Administered 2014-04-20: 400 mg via INTRAVENOUS
  Filled 2014-04-20: qty 200

## 2014-04-20 MED ORDER — SODIUM BICARBONATE 650 MG PO TABS
1300.0000 mg | ORAL_TABLET | Freq: Three times a day (TID) | ORAL | Status: DC
  Administered 2014-04-20 – 2014-04-28 (×24): 1300 mg via ORAL
  Filled 2014-04-20 (×27): qty 2

## 2014-04-20 MED ORDER — ONDANSETRON HCL 4 MG/2ML IJ SOLN: 4.0000 mg | Freq: Four times a day (QID) | INTRAMUSCULAR | Status: DC | PRN

## 2014-04-20 MED ORDER — DOXAZOSIN MESYLATE 4 MG PO TABS
4.0000 mg | ORAL_TABLET | Freq: Every day | ORAL | Status: DC
  Administered 2014-04-20 – 2014-04-28 (×9): 4 mg via ORAL
  Filled 2014-04-20 (×9): qty 1

## 2014-04-20 MED ORDER — ACETAMINOPHEN 325 MG PO TABS
650.0000 mg | ORAL_TABLET | Freq: Four times a day (QID) | ORAL | Status: DC | PRN
  Administered 2014-04-21 – 2014-04-26 (×10): 650 mg via ORAL
  Filled 2014-04-20 (×9): qty 2

## 2014-04-20 MED ORDER — ASPIRIN EC 81 MG PO TBEC
81.0000 mg | DELAYED_RELEASE_TABLET | Freq: Every day | ORAL | Status: DC
  Administered 2014-04-20 – 2014-04-28 (×9): 81 mg via ORAL
  Filled 2014-04-20 (×9): qty 1

## 2014-04-20 MED ORDER — FAMOTIDINE 20 MG PO TABS
20.0000 mg | ORAL_TABLET | Freq: Every day | ORAL | Status: DC
  Administered 2014-04-20 – 2014-04-28 (×9): 20 mg via ORAL
  Filled 2014-04-20 (×9): qty 1

## 2014-04-20 MED ORDER — PIPERACILLIN-TAZOBACTAM IN DEX 2-0.25 GM/50ML IV SOLN
2.2500 g | Freq: Three times a day (TID) | INTRAVENOUS | Status: DC
  Administered 2014-04-20 – 2014-04-22 (×6): 2.25 g via INTRAVENOUS
  Filled 2014-04-20 (×8): qty 50

## 2014-04-20 MED ORDER — PREDNISONE 5 MG PO TABS
5.0000 mg | ORAL_TABLET | Freq: Two times a day (BID) | ORAL | Status: DC
  Administered 2014-04-20 – 2014-04-28 (×16): 5 mg via ORAL
  Filled 2014-04-20 (×19): qty 1

## 2014-04-20 MED ORDER — SODIUM CHLORIDE 0.45 % IV SOLN
INTRAVENOUS | Status: DC
  Administered 2014-04-20 – 2014-04-21 (×2): via INTRAVENOUS

## 2014-04-20 MED ORDER — ONDANSETRON HCL 4 MG/2ML IJ SOLN
4.0000 mg | Freq: Once | INTRAMUSCULAR | Status: AC
  Administered 2014-04-20: 4 mg via INTRAVENOUS
  Filled 2014-04-20: qty 2

## 2014-04-20 MED ORDER — HYDROMORPHONE HCL PF 1 MG/ML IJ SOLN
1.0000 mg | INTRAMUSCULAR | Status: DC | PRN
  Administered 2014-04-20 (×2): 1 mg via INTRAVENOUS
  Filled 2014-04-20 (×2): qty 1

## 2014-04-20 NOTE — Progress Notes (Addendum)
ANTIBIOTIC CONSULT NOTE - INITIAL  Pharmacy Consult:  Cipro Indication:  Intra-abdominal infection  Allergies  Allergen Reactions  . Morphine And Related Nausea And Vomiting and Other (See Comments)    Hallucinations   . Penicillins Nausea And Vomiting and Other (See Comments)    Hallucinations     Patient Measurements: Height: 5\' 8"  (172.7 cm) Weight: 161 lb (73.029 kg) IBW/kg (Calculated) : 68.4  Vital Signs: Temp: 97.5 F (36.4 C) (05/17 0810) Temp src: Oral (05/17 0810) BP: 141/60 mmHg (05/17 1006) Pulse Rate: 81 (05/17 0810)  Labs:  Recent Labs  04/20/14 0825  WBC 10.0  HGB 11.0*  PLT 156  CREATININE 5.58*   Estimated Creatinine Clearance: 11.9 ml/min (by C-G formula based on Cr of 5.58). No results found for this basename: VANCOTROUGH, VANCOPEAK, VANCORANDOM, GENTTROUGH, GENTPEAK, GENTRANDOM, TOBRATROUGH, TOBRAPEAK, TOBRARND, AMIKACINPEAK, AMIKACINTROU, AMIKACIN,  in the last 72 hours   Microbiology: No results found for this or any previous visit (from the past 720 hour(s)).  Medical History: No past medical history on file.   Assessment: 67 YOM admitted with complaint of back pain and was found to have acute diverticulitis with possible abscess.  Pharmacy consulted to manage Cipro for intra-abdominal infection.  Patient is also started on Flagyl.  Baseline labs reviewed.   Goal of Therapy:  Clearance of infection   Plan:  - Cipro 400mg  IV Q24H - Continue Flagyl 500mg  IV Q8H per MD - Monitor renal fxn, clinical course - F/U PMH    Ariyah Sedlack D. 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 Mina Marble, PharmD, BCPS Pager:  [phone] 04/20/2014, 12:58 PM

## 2014-04-20 NOTE — ED Provider Notes (Signed)
CSN: [id number]     Arrival date & time 04/20/14  0755 History   First MD Initiated Contact with Patient 04/20/14 0757     Chief Complaint  Patient presents with  . Back Pain    Patient is a 73 y.o. male presenting with back pain. The history is provided by the patient.  Back Pain Location:  Thoracic spine and lumbar spine Quality:  Shooting and stabbing Radiates to:  Does not radiate Pain severity:  Severe Onset quality:  Gradual Duration:  2 days Timing:  Constant Progression:  Worsening Chronicity:  New Context comment:  Pt had been playing golf but does not recall a specific injury Relieved by:  Narcotics Worsened by:  Twisting Associated symptoms: no abdominal pain, no dysuria, no fever, no tingling and no weakness     No past medical history on file. No past surgical history on file. No family history on file. History  Substance Use Topics  . Smoking status: Not on file  . Smokeless tobacco: Not on file  . Alcohol Use: Not on file    Review of Systems  Constitutional: Negative for fever.  Gastrointestinal: Negative for abdominal pain.  Genitourinary: Negative for dysuria.  Musculoskeletal: Positive for back pain.  Neurological: Negative for tingling and weakness.  All other systems reviewed and are negative.     Allergies  Morphine and related and Penicillins  Home Medications   Prior to Admission medications   Not on File   BP 141/60  Pulse 81  Temp(Src) 97.5 F (36.4 C) (Oral)  Resp 14  Ht 5\' 8"  (1.727 m)  Wt 161 lb (73.029 kg)  BMI 24.49 kg/m2  SpO2 95% Physical Exam  Nursing note and vitals reviewed. Constitutional: No distress.  HENT:  Head: Normocephalic and atraumatic.  Right Ear: External ear normal.  Left Ear: External ear normal.  Eyes: Conjunctivae are normal. Right eye exhibits no discharge. Left eye exhibits no discharge. No scleral icterus.  Neck: Neck supple. No tracheal deviation present.  Cardiovascular: Normal rate,  regular rhythm and intact distal pulses.   Pulmonary/Chest: Effort normal and breath sounds normal. No stridor. No respiratory distress. He has no wheezes. He has no rales.  Abdominal: Soft. Bowel sounds are normal. He exhibits no distension. There is tenderness in the left lower quadrant. There is CVA tenderness ( left-sided). There is no rigidity, no rebound and no guarding. No hernia.  Musculoskeletal: He exhibits no edema and no tenderness.  Neurological: He is alert. He has normal strength. No cranial nerve deficit (no facial droop, extraocular movements intact, no slurred speech) or sensory deficit. He exhibits normal muscle tone. He displays no seizure activity. Coordination normal.  Skin: Skin is warm and dry. No rash noted.  Psychiatric: He has a normal mood and affect.    ED Course  Procedures (including critical care time) Labs Review Labs Reviewed  CBC WITH DIFFERENTIAL - Abnormal; Notable for the following:    RBC 3.33 (*)    Hemoglobin 11.0 (*)    HCT 32.4 (*)    Neutrophils Relative % 78 (*)    Lymphocytes Relative 11 (*)    All other components within normal limits  COMPREHENSIVE METABOLIC PANEL - Abnormal; Notable for the following:    Glucose, Bld 154 (*)    BUN 61 (*)    Creatinine, Ser 5.58 (*)    GFR calc non Af Amer 9 (*)    GFR calc Af Amer 11 (*)    All other components  within normal limits  URINALYSIS, ROUTINE W REFLEX MICROSCOPIC    CT scan results shows short segmental sigmoid colon wall thickening with associated 4.4 centimeter contiguous pericolonic abscess or dilated diverticulum, compatible with acute diverticulitis   Medications  metroNIDAZOLE (FLAGYL) IVPB 500 mg (500 mg Intravenous New Bag/Given 04/20/14 1031)  ondansetron (ZOFRAN) injection 4 mg (4 mg Intravenous Given 04/20/14 0841)  HYDROmorphone (DILAUDID) injection 1 mg (1 mg Intravenous Given 04/20/14 0841)  HYDROmorphone (DILAUDID) injection 1 mg (1 mg Intravenous Given 04/20/14 1031)     MDM   Final diagnoses:  Acute diverticulitis    Patient CT scans suggest acute diverticulitis with possible abscess. The patient is afebrile and hemodynamically stable. I have ordered IV antibiotics. Patient will be admitted to the medical service. General surgery, Dr Barry Dienes, has been consulted as well.   Kathalene Frames, MD 04/20/14 [phone]

## 2014-04-20 NOTE — Consult Note (Signed)
Tyler Mueller May 10, 1941  [id number].    Requesting MD: Dr. Beryle Beams Chief Complaint/Reason for Consult: Diverticulitis with abscess  HPI:   Pt presented today with complaints of left back/flank pain.  He also has had recent constipation and  nausea.  He has no fever/chills.  He has had anorexia.  He is s/p bilateral lung transplant at [facility] 11 years ago for brochiectasis.  He was in the hospital for around 6 months.  He also had an episode of diverticulitis 3 years ago.  He has not taken anything that helps.  He is not having abdominal pain.  He denies chest pain and shortness of breath.       ROS: All systems reviewed and otherwise negative except for as above   No family history on file.  Pmh: Patient Active Problem List   Diagnosis Date Noted  . Pericolonic abscess due to diverticulitis 04/20/2014  . CKD (chronic kidney disease) stage 5, GFR less than 15 ml/min 04/20/2014  . DIABETES MELLITUS 01/04/2008  . DYSLIPIDEMIA 01/04/2008  . IMMUNOCOMPROMISED 01/04/2008  . BRONCHIECTASIS 01/04/2008  . DYSPNEA ON EXERTION 01/04/2008  . Complications of transplanted lung 12/10/2007   Past surgical history: Appendectomy age 35 Bilateral lung transplant 2004.  Social History: previous tobacco, no EtOH/drugs. Retired.    Allergies:  Allergies  Allergen Reactions  . Morphine And Related Nausea And Vomiting and Other (See Comments)    Hallucinations   . Penicillins Nausea And Vomiting and Other (See Comments)    Hallucinations     Medications Prior to Admission  Medication Sig Dispense Refill  . aspirin EC 81 MG tablet Take 81 mg by mouth daily.      . calcium acetate (PHOSLO) 667 MG capsule Take 667 mg by mouth 2 (two) times daily with a meal.      . carvedilol (COREG) 25 MG tablet Take 25 mg by mouth 2 (two) times daily with a meal.      . doxazosin (CARDURA) 4 MG tablet Take 4 mg by mouth daily.      . ferrous gluconate (FERGON) 225 (27 FE) MG tablet Take 240 mg by mouth  3 (three) times daily with meals.      Marland Kitchen glipiZIDE (GLUCOTROL) 5 MG tablet Take 5 mg by mouth daily before breakfast.      . Multiple Vitamin (MULTIVITAMIN WITH MINERALS) TABS tablet Take 1 tablet by mouth daily.      . pravastatin (PRAVACHOL) 40 MG tablet Take 20 mg by mouth daily.      . predniSONE (DELTASONE) 5 MG tablet Take 5 mg by mouth 2 (two) times daily with a meal.      . ranitidine (ZANTAC) 150 MG tablet Take 150 mg by mouth 2 (two) times daily.      . sodium bicarbonate 650 MG tablet Take 1,300 mg by mouth 3 (three) times daily.      Marland Kitchen sulfamethoxazole-trimethoprim (BACTRIM,SEPTRA) 400-80 MG per tablet Take 1 tablet by mouth 3 (three) times a week.      . tacrolimus (PROGRAF) 1 MG capsule Take 1 mg by mouth daily.        Blood pressure 153/62, pulse 77, temperature 97.9 F (36.6 C), temperature source Oral, resp. rate 16, height '5\' 8"'  (1.727 m), weight 163 lb 2.3 oz (74 kg), SpO2 100.00%. Physical Exam: General: pleasant, WD/WN white male who is laying in bed in NAD HEENT: head is normocephalic, atraumatic.  Sclera are noninjected.  PERRL.   Mouth is pink and  moist Heart: regular, rate, and rhythm.  No obvious murmurs, gallops, or rubs noted.  Palpable pedal pulses bilaterally Lungs: CTAB, no wheezes, rhonchi, or rales noted.  Respiratory effort nonlabored Abd: soft, NT/ND, +BS, no masses, hernias, or organomegaly.  Mild guarding in LLQ.   MS: all 4 extremities are symmetrical with no cyanosis, clubbing, or edema. Skin: warm and dry with no masses, lesions, or rashes Psych: A&Ox3 with an appropriate affect. Neuro: CM 2-12 intact, extremity CSM intact bilaterally, normal speech  Results for orders placed during the hospital encounter of 04/20/14 (from the past 48 hour(s))  CBC WITH DIFFERENTIAL     Status: Abnormal   Collection Time    04/20/14  8:25 AM      Result Value Ref Range   WBC 10.0  4.0 - 10.5 K/uL   RBC 3.33 (*) 4.22 - 5.81 MIL/uL   Hemoglobin 11.0 (*) 13.0 - 17.0  g/dL   HCT 32.4 (*) 39.0 - 52.0 %   MCV 97.3  78.0 - 100.0 fL   MCH 33.0  26.0 - 34.0 pg   MCHC 34.0  30.0 - 36.0 g/dL   RDW 14.5  11.5 - 15.5 %   Platelets 156  150 - 400 K/uL   Neutrophils Relative % 78 (*) 43 - 77 %   Neutro Abs 7.7  1.7 - 7.7 K/uL   Lymphocytes Relative 11 (*) 12 - 46 %   Lymphs Abs 1.1  0.7 - 4.0 K/uL   Monocytes Relative 10  3 - 12 %   Monocytes Absolute 1.0  0.1 - 1.0 K/uL   Eosinophils Relative 1  0 - 5 %   Eosinophils Absolute 0.1  0.0 - 0.7 K/uL   Basophils Relative 0  0 - 1 %   Basophils Absolute 0.0  0.0 - 0.1 K/uL  COMPREHENSIVE METABOLIC PANEL     Status: Abnormal   Collection Time    04/20/14  8:25 AM      Result Value Ref Range   Sodium 141  137 - 147 mEq/L   Potassium 5.0  3.7 - 5.3 mEq/L   Chloride 99  96 - 112 mEq/L   CO2 25  19 - 32 mEq/L   Glucose, Bld 154 (*) 70 - 99 mg/dL   BUN 61 (*) 6 - 23 mg/dL   Creatinine, Ser 5.58 (*) 0.50 - 1.35 mg/dL   Calcium 10.5  8.4 - 10.5 mg/dL   Total Protein 7.6  6.0 - 8.3 g/dL   Albumin 3.7  3.5 - 5.2 g/dL   AST 17  0 - 37 U/L   ALT 10  0 - 53 U/L   Alkaline Phosphatase 61  39 - 117 U/L   Total Bilirubin 0.5  0.3 - 1.2 mg/dL   GFR calc non Af Amer 9 (*) >90 mL/min   GFR calc Af Amer 11 (*) >90 mL/min   Comment: (NOTE)     The eGFR has been calculated using the CKD EPI equation.     This calculation has not been validated in all clinical situations.     eGFR's persistently <90 mL/min signify possible Chronic Kidney     Disease.  URINALYSIS, ROUTINE W REFLEX MICROSCOPIC     Status: Abnormal   Collection Time    04/20/14 10:59 AM      Result Value Ref Range   Color, Urine YELLOW  YELLOW   APPearance HAZY (*) CLEAR   Specific Gravity, Urine 1.015  1.005 - 1.030  pH 7.0  5.0 - 8.0   Glucose, UA 100 (*) NEGATIVE mg/dL   Hgb urine dipstick TRACE (*) NEGATIVE   Bilirubin Urine NEGATIVE  NEGATIVE   Ketones, ur NEGATIVE  NEGATIVE mg/dL   Protein, ur 100 (*) NEGATIVE mg/dL   Urobilinogen, UA 0.2  0.0  - 1.0 mg/dL   Nitrite NEGATIVE  NEGATIVE   Leukocytes, UA NEGATIVE  NEGATIVE  URINE MICROSCOPIC-ADD ON     Status: Abnormal   Collection Time    04/20/14 10:59 AM      Result Value Ref Range   Squamous Epithelial / LPF FEW (*) RARE   WBC, UA 0-2  <3 WBC/hpf   RBC / HPF 3-6  <3 RBC/hpf   No results found.    Assessment/Plan Diverticulitis with abscess   Plan 1.  Conservative measures for now, if not resolving may need surgical intervention.    Abscess is read on CT, but looks like may be loop of bowel.  Will review with IR tomorrow.   2.  NPO, bowel rest, IVF, pain control, antiemetics, antibiotics (dose of Cipro/flagyl in ED, now on Zosyn) 3.  SCD's  4.  Ambulate and IS  Stark Klein, [facility] 04/20/2014, 3:15 PM

## 2014-04-20 NOTE — H&P (Signed)
Date: 04/20/2014               Patient Name:  Tyler Mueller MRN: [id number]  DOB: Nov 13, 1941 Age / Sex: 73 y.o., male   PCP: Mikey College, MD         Medical Service: Internal Medicine Teaching Service         Attending Physician: Dr. Annia Belt, MD    First Contact: Dr. Lum Babe, MD Pager: [phone]  Second Contact: Dr. Clinton Gallant, MD Pager: [phone]       After Hours (After 5p/  First Contact Pager: [phone]  weekends / holidays): Second Contact Pager: [phone]   Chief Complaint: Back Pain  History of Present Illness: Tyler Mueller is a 73 y.o. 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 The patient was in his normal state of health until three days ago when he developed flank pain after playing a round of golf. He has since developed worsening back and flank pain. He has associated symptoms of nausea and constipation. He denies abdominal pain, SOB, chest pain, fevers, chills, malaise. He does admit to decreased oral intake of food due to moderate nausea. He admits compliance with his medications (including his anti-rejection medications).   Meds: Current Facility-Administered Medications  Medication Dose Route Frequency Provider Last Rate Last Dose  . ciprofloxacin (CIPRO) IVPB 400 mg  400 mg Intravenous NOW Saundra Shelling, [facility]      . [START ON 04/21/2014] ciprofloxacin (CIPRO) IVPB 400 mg  400 mg Intravenous Q24H Thuy Dien Dang, [facility]      . metroNIDAZOLE (FLAGYL) IVPB 500 mg  500 mg Intravenous Q8H Kathalene Frames, MD 100 mL/hr at 04/20/14 1031 500 mg at 04/20/14 1031   Current Outpatient Prescriptions  Medication Sig Dispense Refill  . aspirin EC 81 MG tablet Take 81 mg by mouth daily.      . calcium acetate (PHOSLO) 667 MG capsule Take 667 mg by mouth 2 (two) times daily with a meal.      . carvedilol (COREG) 25 MG tablet Take 25 mg by mouth 2 (two) times  daily with a meal.      . doxazosin (CARDURA) 4 MG tablet Take 4 mg by mouth daily.      . ferrous gluconate (FERGON) 225 (27 FE) MG tablet Take 240 mg by mouth 3 (three) times daily with meals.      Marland Kitchen glipiZIDE (GLUCOTROL) 5 MG tablet Take 5 mg by mouth daily before breakfast.      . Multiple Vitamin (MULTIVITAMIN WITH MINERALS) TABS tablet Take 1 tablet by mouth daily.      . pravastatin (PRAVACHOL) 40 MG tablet Take 20 mg by mouth daily.      . predniSONE (DELTASONE) 5 MG tablet Take 5 mg by mouth 2 (two) times daily with a meal.      . ranitidine (ZANTAC) 150 MG tablet Take 150 mg by mouth 2 (two) times daily.      . sodium bicarbonate 650 MG tablet Take 1,300 mg by mouth 3 (three) times daily.      Marland Kitchen sulfamethoxazole-trimethoprim (BACTRIM,SEPTRA) 400-80 MG per tablet Take 1 tablet by mouth 3 (three) times a week.      . tacrolimus (PROGRAF) 1 MG capsule Take 1 mg by mouth daily.        Allergies: Allergies as  of 04/20/2014 - Review Complete 04/20/2014  Allergen Reaction Noted  . Morphine and related Nausea And Vomiting and Other (See Comments) 04/20/2014  . Penicillins Nausea And Vomiting and Other (See Comments)    No past medical history on file. No past surgical history on file. No family history on file. History   Social History  . Marital Status: Married    Spouse Name: N/A    Number of Children: N/A  . Years of Education: N/A   Occupational History  . Not on file.   Social History Main Topics  . Smoking status: Not on file  . Smokeless tobacco: Not on file  . Alcohol Use: Not on file  . Drug Use: Not on file  . Sexual Activity: Not on file   Other Topics Concern  . Not on file   Social History Narrative  . No narrative on file    Review of Systems: Pertinent items are noted in HPI.  Physical Exam: Blood pressure 141/60, pulse 81, temperature 97.5 F (36.4 C), temperature source Oral, resp. rate 14, height 5\' 8"  (1.727 m), weight 161 lb (73.029 kg), SpO2  95.00%. Physical Exam  Constitutional: He is oriented to person, place, and time. He appears well-developed and well-nourished. No distress.  Cardiovascular: Normal rate, regular rhythm, normal heart sounds and intact distal pulses.  Exam reveals no friction rub.   No murmur heard. Pulmonary/Chest: Effort normal and breath sounds normal. No respiratory distress. He has no wheezes. He has no rales.  Abdominal: Soft. Bowel sounds are normal. He exhibits no distension. There is no tenderness. There is no rebound and no guarding.  Musculoskeletal: He exhibits no edema and no tenderness.  Neurological: He is alert and oriented to person, place, and time.  Skin: He is not diaphoretic.  Psychiatric: He has a normal mood and affect. His behavior is normal.     Lab results: Basic Metabolic Panel:  Recent Labs  04/20/14 0825  NA 141  K 5.0  CL 99  CO2 25  GLUCOSE 154*  BUN 61*  CREATININE 5.58*  CALCIUM 10.5   Liver Function Tests:  Recent Labs  04/20/14 0825  AST 17  ALT 10  ALKPHOS 61  BILITOT 0.5  PROT 7.6  ALBUMIN 3.7   CBC:  Recent Labs  04/20/14 0825  WBC 10.0  NEUTROABS 7.7  HGB 11.0*  HCT 32.4*  MCV 97.3  PLT 156   Urinalysis:  Recent Labs  04/20/14 1059  COLORURINE YELLOW  LABSPEC 1.015  PHURINE 7.0  GLUCOSEU 100*  HGBUR TRACE*  BILIRUBINUR NEGATIVE  KETONESUR NEGATIVE  PROTEINUR 100*  UROBILINOGEN 0.2  NITRITE NEGATIVE  LEUKOCYTESUR NEGATIVE   Imaging results:  No results found.  Other results: EKG: not available at time of admission.  Assessment & Plan by Problem: Principal Problem:   Pericolonic abscess due to diverticulitis Active Problems:   DIABETES MELLITUS   Complications of transplanted lung   CKD (chronic kidney disease) stage 5, GFR less than 15 ml/min  Pericolonic Abscess 2/2 diverticulitis The patient has presented with second episode of diverticulitis. This time it is complicated by pericolonic abscess. His abdominal  exam is benign. However, the possibility of a diminished response due to immunosuppression is likely. The abscess may require drainage. Will differ to general surgery for evaluation for abscess drainage.  - speak with transplant team at [facility] regarding possible transfer due to history of double lung transplant - cipro flagyl IV in the ED - ABX per pharm given  ESRD - consult general surgery for rec's - npo pending surgical eval. - dilaudid 1mg  IV q 4 hr prn for three doses then will reassess  End stage CKD (GFR 9), G5 Patients Cr 5.6 on admission. Baseline 4-4.8 (per care everywhere). Patient has fistula that has never been used. Primary nephrologist is Dr. Marval Regal. Likely represents acute on chronic renal failure due to decreased PO intake and volume depletion. - Consult Renal, appreciate rec's  Bronchiectasis s/p double lung transplant The patient is compliant with immunosuppressive regimen. He reports that everything has been going well. No evidence of chest complaints at this time. - continue home meds - tacrolimus 1 mg daily - prednisone 5 mg daily  DM Stable. Hold home meds. -SSI  HTN Moderately htn on admission.  - continue home coreg  GERD - continue H2 blocker  BPH - continue prazosin  Dispo: Disposition is deferred at this time, awaiting improvement of current medical problems. Anticipated discharge in approximately 3-4 day(s).   The patient does have a current PCP (John Mendel Corning, MD) and does not need an [facility] hospital follow-up appointment after discharge.  The patient does not have transportation limitations that hinder transportation to clinic appointments.  Signed: Marrion Coy, MD 04/20/2014, 11:40 AM    Internal Medicine Attending Admission Note Date: 04/21/2014  Patient name: Tyler Mueller Medical record number: [id number] Date of birth: [birth year] Age: 73 y.o. Gender: male  I saw and evaluated the patient. I reviewed the resident's note and  I agree with the resident's findings and plan as documented in the resident's note.  Chief Complaint(s): Acute left flank and low back pain    History - key components related to admission: Complicated 73 year old man 11 years status post bilateral lung transplant for what he was told was bronchiectasis. He is on chronic immunosuppressive drugs with low-dose prednisone and Prograf. He has developed progressive renal insufficiency and is pre-dialysis at this time. He has already had a AV fistula placed in anticipation of the need for eventual dialysis. He has a history of prior episodes of diverticulitis. He developed low back and left flank pain which started after he was out cutting his grass with a weed whacker. The pain got worse after he played a round of golf with his brother-in-law. He did not complain of abdominal pain. He did not have any fevers. I am unable to locate the emergency department note. For reasons that are unclear to me, a CT scan of the abdomen and pelvis was ordered. This showed what was initially felt to be an area of diverticulitis with abscess. Other than mild guarding in the left lower corner and, abdominal exam by surgery was unremarkable. This called into question whether or not he in fact has a diverticular abscess. Scans were reviewed again. Impression at this point is that this is likely just a large diverticulum without abscess formation. In view of his immunocompromised status, cultures were obtained and he was started on broad-spectrum parenteral antibiotics.    Physical Exam - key components related to admission: Tenderness on palpation over the left lower paraspinal muscles reported by other examiners. The patient is currently sound asleep following sedation. I was able to palpate his abdomen in all 4 quadrants without eliciting any pain response. Abdomen is moderately distended. Absent bowel sounds. He remains afebrile. Minimal elevation of his white count at  10,000 with 78% neutrophils. On the prior white count recorded in this system was 9400 in August of 2012. Repeat white count today  May 18 is back to his baseline and is not thousand 400.   Filed Vitals:   04/20/14 1417 04/20/14 2209 04/21/14 0526 04/21/14 1443  BP: 153/62 179/69 161/69 161/66  Pulse:  69 75 69  Temp:  97.8 F (36.6 C) 97.9 F (36.6 C) 98.4 F (36.9 C)  TempSrc:  Oral  Oral  Resp:  19 18 17   Height:      Weight:   164 lb 6.3 oz (74.568 kg)   SpO2:  97% 99% 96%    Lab results:   Basic Metabolic Panel:  Recent Labs  04/20/14 0825 04/21/14 0514  NA 141 137  K 5.0 5.6*  CL 99 97  CO2 25 25  GLUCOSE 154* 113*  BUN 61* 66*  CREATININE 5.58* 5.65*  CALCIUM 10.5 10.1  MG 2.1  --   PHOS 4.9*  --     Liver Function Tests:  Recent Labs  04/20/14 0825 04/21/14 0514  AST 17 16  ALT 10 9  ALKPHOS 61 54  BILITOT 0.5 0.6  PROT 7.6 7.0  ALBUMIN 3.7 3.3*   No results found for this basename: LIPASE, AMYLASE,  in the last 72 hours No results found for this basename: AMMONIA,  in the last 72 hours  CBC:  Recent Labs  04/20/14 0825 04/21/14 0514  WBC 10.0 9.4  HGB 11.0* 10.0*  HCT 32.4* 31.0*  MCV 97.3 99.4  PLT 156 161    Recent Labs  04/20/14 0825  NEUTROABS 7.7  LYMPHSABS 1.1  MONOABS 1.0  EOSABS 0.1  BASOSABS 0.0    Cardiac Enzymes: No results found for this basename: CKTOTAL, CKMB, CKMBINDEX, TROPONINI,  in the last 72 hours  BNP: No results found for this basename: PROBNP,  in the last 72 hours  D-Dimer: No results found for this basename: DDIMER,  in the last 72 hours  CBG:  Recent Labs  04/20/14 1700 04/20/14 2208 04/21/14 0810 04/21/14 1211  GLUCAP 176* 107* 120* 122*    Hemoglobin A1C: No results found for this basename: HGBA1C,  in the last 72 hours Fasting Lipid Panel: No results found for this basename: CHOL, HDL, LDLCALC, TRIG, CHOLHDL, LDLDIRECT,  in the last 72 hours  Thyroid Function Tests: No results  found for this basename: TSH, T4TOTAL, FREET4, T3FREE, THYROIDAB,  in the last 72 hours  Anemia Panel: No results found for this basename: VITAMINB12, FOLATE, FERRITIN, TIBC, IRON, RETICCTPCT,  in the last 72 hours  Coagulation: No results found for this basename: INR,  in the last 72 hours  Urine Drug Screen: Drugs of Abuse  No results found for this basename: labopia, cocainscrnur, labbenz, amphetmu, thcu, labbarb     Alcohol Level: No results found for this basename: ETH,  in the last 72 hours  Urinalysis    Component Value Date/Time   COLORURINE YELLOW 04/20/2014 1059   APPEARANCEUR HAZY* 04/20/2014 1059   LABSPEC 1.015 04/20/2014 1059   PHURINE 7.0 04/20/2014 1059   GLUCOSEU 100* 04/20/2014 1059   GLUCOSEU 100 02/07/2011 1031   HGBUR TRACE* 04/20/2014 [place] 04/20/2014 1059   KETONESUR NEGATIVE 04/20/2014 1059   PROTEINUR 100* 04/20/2014 1059   UROBILINOGEN 0.2 04/20/2014 1059   NITRITE NEGATIVE 04/20/2014 [place] 04/20/2014 1059    Urine microscopic:  Recent Labs  04/20/14 1059  EPIU FEW*  WBCU 0-2  RBCU 3-6    Misc. Labs:   Imaging results:  Ct Abdomen Pelvis Wo Contrast  04/20/2014  CLINICAL DATA:  Back pain. Remote history of lung transplant. Pre dialysis.  EXAM: CT ABDOMEN AND PELVIS WITHOUT CONTRAST  TECHNIQUE: Multidetector CT imaging of the abdomen and pelvis was performed following the standard protocol without IV contrast.  COMPARISON:  No similar prior exam is available at this institution for comparison or on [facility].  FINDINGS: Scarring is noted at the lung bases. Trace bilateral pleural fluid. Evidence of median sternotomy. Cardiomegaly noted.  Calcified granulomas are noted in the spleen. Unenhanced liver, gallbladder, adrenal glands, spleen are unremarkable. Nonobstructing bilateral renal calculi are noted, largest 5 mm right upper renal pole. 2.1 cm exophytic right mid renal cortical cyst noted. No  hydroureteronephrosis. No radiopaque ureteral or bladder calculus. No ascites or lymphadenopathy or free air. Severe atheromatous aortic calcification without aneurysm.  Areas of stranding within the anterior abdominal wall subcutaneous fat likely reflect injection sites.  There is focal sigmoid colonic wall thickening image 72 with an adjacent thick-walled pericolonic abscess or dilated diverticulum containing air-fluid level measuring 4.4 cm image 67. The remainder of the large bowel and small bowel appear unremarkable. No pelvic free fluid or lymphadenopathy. Bladder is normal. No acute osseous abnormality. The degenerative change identified in the spine. Minimal superior endplate compression deformities at L2 and L4 are noted which could reflect disc degenerative change/ Schmorl node formation, without bony retropulsion.  IMPRESSION: Short segmental sigmoid colonic wall thickening with associated 4.4 cm contiguous pericolonic abscess or dilated diverticulum, compatible with acute diverticulitis. These results were called by telephone at the time of interpretation on 04/20/2014 at 9:13 AM to Dr. Dorie Rank , who verbally acknowledged these results.   Electronically Signed   By: Conchita Paris M.D.   On: 04/20/2014 09:13    Other results: EKG:  Assessment: #1. Paraspinal muscle spasm/tenderness following athletic activity. There is low clinical and radiographic suspicion that he in fact is having another bout of diverticulitis. #2. Status post bilateral lung transplants on chronic immunosuppressive therapy #3. Advanced renal disease    pre -dialysis  Status  #4. Type 2 diabetes  Plan: Continue close observation and antibiotics at this time. Consider a noncontrast MRI scan of his lower spine and paraspinal muscles at his symptoms persist.

## 2014-04-20 NOTE — Consult Note (Signed)
Tyler Mueller is an 73 y.o. male referred by Dr Beryle Beams   Chief Complaint: CKD5 HPI: 73yo WM with hx CKD5 (says Scr 3.5-5) admitted today for Lt back pain x 2 days.  CT scan shows diverticulitis with pericolonic abscess.  PO intake poor last 2 days. Scr 5.58.  Had AVF placed 2 years ago.  No past medical history on file.HTN, DM, Hx diverticulitis  No past surgical history on file.Double lung transplant  No family history on file.neg for renal disease Social History:  has no tobacco, alcohol, and drug history on file.Nonsmoker, nondrinker.  Married and lives with wife in [place]  Allergies:  Allergies  Allergen Reactions  . Morphine And Related Nausea And Vomiting and Other (See Comments)    Hallucinations   . Penicillins Nausea And Vomiting and Other (See Comments)    Hallucinations     Medications Prior to Admission  Medication Sig Dispense Refill  . aspirin EC 81 MG tablet Take 81 mg by mouth daily.      . calcium acetate (PHOSLO) 667 MG capsule Take 667 mg by mouth 2 (two) times daily with a meal.      . carvedilol (COREG) 25 MG tablet Take 25 mg by mouth 2 (two) times daily with a meal.      . doxazosin (CARDURA) 4 MG tablet Take 4 mg by mouth daily.      . ferrous gluconate (FERGON) 225 (27 FE) MG tablet Take 240 mg by mouth 3 (three) times daily with meals.      Marland Kitchen glipiZIDE (GLUCOTROL) 5 MG tablet Take 5 mg by mouth daily before breakfast.      . Multiple Vitamin (MULTIVITAMIN WITH MINERALS) TABS tablet Take 1 tablet by mouth daily.      . pravastatin (PRAVACHOL) 40 MG tablet Take 20 mg by mouth daily.      . predniSONE (DELTASONE) 5 MG tablet Take 5 mg by mouth 2 (two) times daily with a meal.      . ranitidine (ZANTAC) 150 MG tablet Take 150 mg by mouth 2 (two) times daily.      . sodium bicarbonate 650 MG tablet Take 1,300 mg by mouth 3 (three) times daily.      Marland Kitchen sulfamethoxazole-trimethoprim (BACTRIM,SEPTRA) 400-80 MG per tablet Take 1 tablet by mouth 3 (three) times a  week.      . tacrolimus (PROGRAF) 1 MG capsule Take 1 mg by mouth daily.         Lab Results: UA: 0-2 wbc 3-6 rbc  100mg  protein   Recent Labs  04/20/14 0825  WBC 10.0  HGB 11.0*  HCT 32.4*  PLT 156   BMET  Recent Labs  04/20/14 0825  NA 141  K 5.0  CL 99  CO2 25  GLUCOSE 154*  BUN 61*  CREATININE 5.58*  CALCIUM 10.5   LFT  Recent Labs  04/20/14 0825  PROT 7.6  ALBUMIN 3.7  AST 17  ALT 10  ALKPHOS 61  BILITOT 0.5   No results found.  ROS: No change in vision No sob No cp No abd pain No F/C/S + lt lower flank pain No arthritic CO  no dysuria  PHYSICAL EXAM: Blood pressure 171/68, pulse 77, temperature 97.9 F (36.6 C), temperature source Oral, resp. rate 16, height 5\' 8"  (1.727 m), weight 74 kg (163 lb 2.3 oz), SpO2 100.00%. HEENT: PERRLA EOMI NECK:no JVD LUNGS:Few basilar crackles CARDIAC:RRR wo MRG ABD:+ BS NTND No HSM  + tenderness to  lt flank EXT:no edema Lt AVF + bruit NEURO:CNI M&SI Ox3 no asterixis  Assessment: 1. CKD5.  He is at risk of renal fx worsening and he is aware of this. 2. Pericolonic abscess 3. Double Lung transplant 4. HTN PLAN: 1. IV fluids at 75cc/hr ie 1/2NS or if needed D51/2NS 2. Surgical consult 3. Daily Scr 4. IV AB 5. Stress dose steroids if goes to surgery of if BP drops   Windy Kalata 04/20/2014, 1:31 PM

## 2014-04-20 NOTE — ED Notes (Signed)
Wife asking if pt can get more pain medicine.

## 2014-04-20 NOTE — ED Notes (Signed)
To ED via private vehicle from home with c/o severe back pain, started 2 days ago, gradually worsening, has been taking Oxycodone 5mg  without relief--- states "I think I have a kidney stone". Hx double lung transplant 11 years ago, dialysis graft in left forearm-- not receiving dialysis yet.

## 2014-04-20 NOTE — ED Notes (Signed)
Admitting MD at the bedside.  

## 2014-04-21 ENCOUNTER — Encounter (HOSPITAL_COMMUNITY): Payer: Self-pay | Admitting: General Practice

## 2014-04-21 DIAGNOSIS — M549 Dorsalgia, unspecified: Secondary | ICD-10-CM

## 2014-04-21 DIAGNOSIS — N186 End stage renal disease: Secondary | ICD-10-CM

## 2014-04-21 DIAGNOSIS — Z942 Lung transplant status: Secondary | ICD-10-CM

## 2014-04-21 DIAGNOSIS — J479 Bronchiectasis, uncomplicated: Secondary | ICD-10-CM

## 2014-04-21 DIAGNOSIS — M62830 Muscle spasm of back: Secondary | ICD-10-CM | POA: Diagnosis present

## 2014-04-21 DIAGNOSIS — E119 Type 2 diabetes mellitus without complications: Secondary | ICD-10-CM

## 2014-04-21 DIAGNOSIS — I10 Essential (primary) hypertension: Secondary | ICD-10-CM

## 2014-04-21 DIAGNOSIS — N400 Enlarged prostate without lower urinary tract symptoms: Secondary | ICD-10-CM

## 2014-04-21 DIAGNOSIS — K219 Gastro-esophageal reflux disease without esophagitis: Secondary | ICD-10-CM

## 2014-04-21 HISTORY — DX: Lung transplant status: Z94.2

## 2014-04-21 HISTORY — DX: Muscle spasm of back: M62.830

## 2014-04-21 LAB — GLUCOSE, CAPILLARY
GLUCOSE-CAPILLARY: 128 mg/dL — AB (ref 70–99)
Glucose-Capillary: 120 mg/dL — ABNORMAL HIGH (ref 70–99)
Glucose-Capillary: 122 mg/dL — ABNORMAL HIGH (ref 70–99)
Glucose-Capillary: 201 mg/dL — ABNORMAL HIGH (ref 70–99)

## 2014-04-21 LAB — CBC
HCT: 31 % — ABNORMAL LOW (ref 39.0–52.0)
HEMOGLOBIN: 10 g/dL — AB (ref 13.0–17.0)
MCH: 32.1 pg (ref 26.0–34.0)
MCHC: 32.3 g/dL (ref 30.0–36.0)
MCV: 99.4 fL (ref 78.0–100.0)
Platelets: 161 10*3/uL (ref 150–400)
RBC: 3.12 MIL/uL — AB (ref 4.22–5.81)
RDW: 14.8 % (ref 11.5–15.5)
WBC: 9.4 10*3/uL (ref 4.0–10.5)

## 2014-04-21 LAB — COMPREHENSIVE METABOLIC PANEL
ALT: 9 U/L (ref 0–53)
AST: 16 U/L (ref 0–37)
Albumin: 3.3 g/dL — ABNORMAL LOW (ref 3.5–5.2)
Alkaline Phosphatase: 54 U/L (ref 39–117)
BUN: 66 mg/dL — ABNORMAL HIGH (ref 6–23)
CALCIUM: 10.1 mg/dL (ref 8.4–10.5)
CO2: 25 meq/L (ref 19–32)
CREATININE: 5.65 mg/dL — AB (ref 0.50–1.35)
Chloride: 97 mEq/L (ref 96–112)
GFR calc Af Amer: 11 mL/min — ABNORMAL LOW (ref >90)
GFR, EST NON AFRICAN AMERICAN: 9 mL/min — AB (ref >90)
Glucose, Bld: 113 mg/dL — ABNORMAL HIGH (ref 70–99)
Potassium: 5.6 mEq/L — ABNORMAL HIGH (ref 3.7–5.3)
Sodium: 137 mEq/L (ref 137–147)
Total Bilirubin: 0.6 mg/dL (ref 0.3–1.2)
Total Protein: 7 g/dL (ref 6.0–8.3)

## 2014-04-21 MED ORDER — HYDROMORPHONE HCL PF 1 MG/ML IJ SOLN
1.0000 mg | INTRAMUSCULAR | Status: DC | PRN
  Administered 2014-04-21: 1 mg via INTRAVENOUS
  Filled 2014-04-21: qty 1

## 2014-04-21 MED ORDER — HYDROMORPHONE HCL PF 1 MG/ML IJ SOLN
0.5000 mg | INTRAMUSCULAR | Status: DC | PRN
  Administered 2014-04-21 (×4): 0.5 mg via INTRAVENOUS
  Filled 2014-04-21 (×5): qty 1

## 2014-04-21 MED ORDER — HYDROMORPHONE HCL PF 1 MG/ML IJ SOLN
0.5000 mg | INTRAMUSCULAR | Status: DC | PRN
  Administered 2014-04-21 – 2014-04-22 (×4): 0.5 mg via INTRAVENOUS
  Filled 2014-04-21 (×4): qty 1

## 2014-04-21 MED ORDER — HYDROMORPHONE HCL PF 1 MG/ML IJ SOLN
1.0000 mg | Freq: Once | INTRAMUSCULAR | Status: AC
  Administered 2014-04-21: 1 mg via INTRAVENOUS
  Filled 2014-04-21: qty 1

## 2014-04-21 MED ORDER — SODIUM BICARBONATE 8.4 % IV SOLN
INTRAVENOUS | Status: DC
  Administered 2014-04-21 – 2014-04-22 (×2): via INTRAVENOUS
  Filled 2014-04-21 (×3): qty 150

## 2014-04-21 MED ORDER — LORAZEPAM 2 MG/ML IJ SOLN
1.0000 mg | Freq: Once | INTRAMUSCULAR | Status: AC
  Administered 2014-04-21: 1 mg via INTRAVENOUS
  Filled 2014-04-21: qty 1

## 2014-04-21 NOTE — Progress Notes (Signed)
Patient sleeping soundly s/p Ativan. Hypoactive bowel sounds Abd - relatively soft; does not seem to react to deep palpation.  Would still consider transfer back to [facility].  If the patient should need emergent surgery, they would be better suited to manage his pulmonary status with the transplants at that institution.  Imogene Burn. Georgette Dover, MD, [facility]  General/ Trauma Surgery  04/21/2014 1:58 PM

## 2014-04-21 NOTE — Progress Notes (Signed)
Subjective:  There were reports of severe pain- he is sleeping when I see him- just got some ativan Objective Vital signs in last 24 hours: Filed Vitals:   04/20/14 1304 04/20/14 1417 04/20/14 2209 04/21/14 0526  BP: 171/68 153/62 179/69 161/69  Pulse: 77  69 75  Temp: 97.9 F (36.6 C)  97.8 F (36.6 C) 97.9 F (36.6 C)  TempSrc: Oral  Oral   Resp: 16  19 18   Height: 5\' 8"  (1.727 m)     Weight: 74 kg (163 lb 2.3 oz)   74.568 kg (164 lb 6.3 oz)  SpO2: 100%  97% 99%   Weight change:   Intake/Output Summary (Last 24 hours) at 04/21/14 1330 Last data filed at 04/21/14 0600  Gross per 24 hour  Intake 1253.75 ml  Output    425 ml  Net 828.75 ml    Assessment/ Plan: Pt is a 73 y.o. yo male who was admitted on 04/20/2014 with abdominal/back pain - possible intraabdominal abscess- also with advanced CKD at baseline Assessment/Plan: 1. Renal- according to wife- baseline CKD with creatinine as high as 5.  Probably secondary to prograf toxicity from lung transplants.  He has an AVF in place- no indications for dialysis right now but we will continue to follos 2. Abdominal /back pain- possible diverticulitis with abscess- surgery following- conservative management for now- on zosyn - needing much pain meds 3. Anemia- not too significant at this time- no procrit as OP 4. Secondary hyperparathyroidism- on home phoslo when taking POs again 5. HTN/volume- getting IVF  6. Hyperkalemia-  - will change IVF to bicarb and follow  Louis Meckel    Labs: Basic Metabolic Panel:  Recent Labs Lab 04/20/14 0825 04/21/14 0514  NA 141 137  K 5.0 5.6*  CL 99 97  CO2 25 25  GLUCOSE 154* 113*  BUN 61* 66*  CREATININE 5.58* 5.65*  CALCIUM 10.5 10.1  PHOS 4.9*  --    Liver Function Tests:  Recent Labs Lab 04/20/14 0825 04/21/14 0514  AST 17 16  ALT 10 9  ALKPHOS 61 54  BILITOT 0.5 0.6  PROT 7.6 7.0  ALBUMIN 3.7 3.3*   No results found for this basename: LIPASE, AMYLASE,  in  the last 168 hours No results found for this basename: AMMONIA,  in the last 168 hours CBC:  Recent Labs Lab 04/20/14 0825 04/21/14 0514  WBC 10.0 9.4  NEUTROABS 7.7  --   HGB 11.0* 10.0*  HCT 32.4* 31.0*  MCV 97.3 99.4  PLT 156 161   Cardiac Enzymes: No results found for this basename: CKTOTAL, CKMB, CKMBINDEX, TROPONINI,  in the last 168 hours CBG:  Recent Labs Lab 04/20/14 1700 04/20/14 2208 04/21/14 0810 04/21/14 1211  GLUCAP 176* 107* 120* 122*    Iron Studies: No results found for this basename: IRON, TIBC, TRANSFERRIN, FERRITIN,  in the last 72 hours Studies/Results: No results found. Medications: Infusions: . sodium chloride 75 mL/hr at 04/21/14 0227    Scheduled Medications: . aspirin EC  81 mg Oral Daily  . calcium acetate  667 mg Oral BID WC  . carvedilol  25 mg Oral BID WC  . doxazosin  4 mg Oral Daily  . famotidine  20 mg Oral Daily  . insulin aspart  0-9 Units Subcutaneous TID WC  . piperacillin-tazobactam (ZOSYN)  IV  2.25 g Intravenous 3 times per day  . predniSONE  5 mg Oral BID WC  . sodium bicarbonate  1,300 mg  Oral TID  . sodium chloride  3 mL Intravenous Q12H  . tacrolimus  1 mg Oral Daily    have reviewed scheduled and prn medications.  Physical Exam: General: sleeping- actually difficult to arouse Heart:  RRR Lungs: mostly clear Abdomen: distended but did not react too much with palpation Extremities: no edema    04/21/2014,1:30 PM  LOS: 1 day

## 2014-04-21 NOTE — Progress Notes (Signed)
Subjective:  Patient continues to complain of back pain despite IV dilaudid .5 mg q 2 hr prn. No abdominal complaints at this time.  Objective: Vital signs in last 24 hours: Filed Vitals:   04/20/14 1304 04/20/14 1417 04/20/14 2209 04/21/14 0526  BP: 171/68 153/62 179/69 161/69  Pulse: 77  69 75  Temp: 97.9 F (36.6 C)  97.8 F (36.6 C) 97.9 F (36.6 C)  TempSrc: Oral  Oral   Resp: 16  19 18   Height: 5\' 8"  (1.727 m)     Weight: 163 lb 2.3 oz (74 kg)   164 lb 6.3 oz (74.568 kg)  SpO2: 100%  97% 99%   Weight change:   Intake/Output Summary (Last 24 hours) at 04/21/14 1156 Last data filed at 04/21/14 0600  Gross per 24 hour  Intake 1453.75 ml  Output    425 ml  Net 1028.75 ml   Physical Exam  Constitutional: He is oriented to person, place, and time.  Abdominal: Soft. Bowel sounds are normal. He exhibits no distension. There is no tenderness. There is no rebound and no guarding.  Musculoskeletal: He exhibits tenderness.  Patient has increased muscle tone along the left paraspinal muscles. The area is the low thoracic and upper lumbar. There is no deformity or skin breakdown. There is no erythema. There is tenderness to palpation of this area. This reproduces the back pain that he has been complaining of.   Neurological: He is alert and oriented to person, place, and time.  Psychiatric: He has a normal mood and affect. His behavior is normal.    Lab Results: Basic Metabolic Panel:  Recent Labs Lab 04/20/14 0825 04/21/14 0514  NA 141 137  K 5.0 5.6*  CL 99 97  CO2 25 25  GLUCOSE 154* 113*  BUN 61* 66*  CREATININE 5.58* 5.65*  CALCIUM 10.5 10.1  MG 2.1  --   PHOS 4.9*  --    Liver Function Tests:  Recent Labs Lab 04/20/14 0825 04/21/14 0514  AST 17 16  ALT 10 9  ALKPHOS 61 54  BILITOT 0.5 0.6  PROT 7.6 7.0  ALBUMIN 3.7 3.3*   CBC:  Recent Labs Lab 04/20/14 0825 04/21/14 0514  WBC 10.0 9.4  NEUTROABS 7.7  --   HGB 11.0* 10.0*  HCT 32.4* 31.0*   MCV 97.3 99.4  PLT 156 161   CBG:  Recent Labs Lab 04/20/14 1700 04/20/14 2208 04/21/14 0810  GLUCAP 176* 107* 120*   Urinalysis:  Recent Labs Lab 04/20/14 1059  COLORURINE YELLOW  LABSPEC 1.015  PHURINE 7.0  GLUCOSEU 100*  HGBUR TRACE*  BILIRUBINUR NEGATIVE  KETONESUR NEGATIVE  PROTEINUR 100*  UROBILINOGEN 0.2  NITRITE NEGATIVE  LEUKOCYTESUR NEGATIVE   Studies/Results: No results found. Medications: I have reviewed the patient's current medications. Scheduled Meds: . aspirin EC  81 mg Oral Daily  . calcium acetate  667 mg Oral BID WC  . carvedilol  25 mg Oral BID WC  . doxazosin  4 mg Oral Daily  . famotidine  20 mg Oral Daily  . insulin aspart  0-9 Units Subcutaneous TID WC  . LORazepam  1 mg Intravenous Once  . piperacillin-tazobactam (ZOSYN)  IV  2.25 g Intravenous 3 times per day  . predniSONE  5 mg Oral BID WC  . sodium bicarbonate  1,300 mg Oral TID  . sodium chloride  3 mL Intravenous Q12H  . tacrolimus  1 mg Oral Daily   Continuous Infusions: . sodium chloride  75 mL/hr at 04/21/14 0227   PRN Meds:.acetaminophen, acetaminophen, HYDROmorphone (DILAUDID) injection, ondansetron (ZOFRAN) IV, ondansetron Assessment/Plan: Principal Problem:   Pericolonic abscess due to diverticulitis Active Problems:   DIABETES MELLITUS   Complications of transplanted lung   CKD (chronic kidney disease) stage 5, GFR less than 15 ml/min  Possible Pericolonic Abscess 2/2 possible diverticulitis  The patient has a benign abdominal exam. However, the possibility of a diminished response due to immunosuppression is likely. There was a question of a pericolonic abscess on admission. The patient was made NPO and treated with Flagyl Cipro and now Zosyn. General surgery rec to treat conservatively with NPO and IV abx on admission. On review of the CT abdomen there was very little evidence of inflammation. Thus, there may be very mild diverticulitis. Further, the possible abscess  appeared to be more likely a large diverticula without complication, but abscess could not be ruled out.  - speak with transplant team at [facility] regarding possible transfer due to history of double lung transplant  - cipro flagyl IV in the ED and now zosyn - ABX per pharm given ESRD  - consult general surgery for rec's  - npo per surgery - dilaudid 1mg  IV q 2 hr prn for three doses then will reassess   Back Pain The patients back pain appears most likely to be secondary to muscle spasm. The paraspinal muscles are tender to palpation and have increased tone. As patient is NPO, there are limited options for IV treatment. Will try IV BZD and reassess. Of note the patient takes 5 mg oxycodone IR as needed for chronic pain. He typically takes 2 tablets daily. Other possible causes of the patients pain are compression fracture, radiculopathy. There are no signs of nerve of spinal cord involvement at this time. - IV ativan 1 mg - dilaudid 1mg  IV q 2 hr prn for three doses then will reassess   End stage CKD (GFR 9), G5  Patients Cr 5.6 on admission. Baseline 4-4.8 (per care everywhere). Patient has fistula that has never been used. Primary nephrologist is Dr. Marval Regal. Likely represents acute on chronic renal failure due to decreased PO intake and volume depletion.   - K at 5.6 today, will appreciate renal rec's - Consult Renal, appreciate rec's   Bronchiectasis s/p double lung transplant  The patient is compliant with immunosuppressive regimen. He reports that everything has been going well. No evidence of chest complaints at this time. Plan to discuss case with Lung transplant team and [facility]. - continue home meds  - tacrolimus 1 mg daily  - prednisone 5 mg daily   DM  Stable. Hold home meds.  -SSI   HTN  Moderately htn on admission. Likely due to acute pain. - continue home coreg   GERD  - continue H2 blocker   BPH  - continue prazosin    Dispo: Disposition is deferred at this time,  awaiting improvement of current medical problems.  Anticipated discharge in approximately 2-3 day(s).   The patient does have a current PCP (John Mendel Corning, MD) and does not need an [facility] hospital follow-up appointment after discharge.  The patient does not have transportation limitations that hinder transportation to clinic appointments.  .Services Needed at time of discharge: Y = Yes, Blank = No PT:   OT:   RN:   Equipment:   Other:     LOS: 1 day   Marrion Coy, MD 04/21/2014, 11:56 AM

## 2014-04-21 NOTE — Progress Notes (Signed)
Patient ID: Tyler Mueller, male   DOB: June 28, 1941, 73 y.o.   MRN: [id number]    Subjective: Pt continues to complain of horrible left lower back pain, but no abdominal pain.  Occasional nausea.  States pain worse with movements  Objective: Vital signs in last 24 hours: Temp:  [97.8 F (36.6 C)-97.9 F (36.6 C)] 97.9 F (36.6 C) (05/18 0526) Pulse Rate:  [69-77] 75 (05/18 0526) Resp:  [13-26] 18 (05/18 0526) BP: (122-179)/(58-69) 161/69 mmHg (05/18 0526) SpO2:  [93 %-100 %] 99 % (05/18 0526) Weight:  [163 lb 2.3 oz (74 kg)-164 lb 6.3 oz (74.568 kg)] 164 lb 6.3 oz (74.568 kg) (05/18 0526) Last BM Date: 04/18/14  Intake/Output from previous day: 05/17 0701 - 05/18 0700 In: 1453.8 [I.V.:1203.8; IV Piggyback:250] Out: 425 [Urine:425] Intake/Output this shift:    PE: Abd: soft, NT, ND, +BS, tender in left lower back area to touch.  Lab Results:   Recent Labs  04/20/14 0825 04/21/14 0514  WBC 10.0 9.4  HGB 11.0* 10.0*  HCT 32.4* 31.0*  PLT 156 161   BMET  Recent Labs  04/20/14 0825 04/21/14 0514  NA 141 137  K 5.0 5.6*  CL 99 97  CO2 25 25  GLUCOSE 154* 113*  BUN 61* 66*  CREATININE 5.58* 5.65*  CALCIUM 10.5 10.1   PT/INR No results found for this basename: LABPROT, INR,  in the last 72 hours CMP     Component Value Date/Time   NA 137 04/21/2014 0514   K 5.6* 04/21/2014 0514   CL 97 04/21/2014 0514   CO2 25 04/21/2014 0514   GLUCOSE 113* 04/21/2014 0514   GLUCOSE 104* 10/31/2006 0755   BUN 66* 04/21/2014 0514   CREATININE 5.65* 04/21/2014 0514   CALCIUM 10.1 04/21/2014 0514   CALCIUM 8.5 05/09/2011 1339   PROT 7.0 04/21/2014 0514   ALBUMIN 3.3* 04/21/2014 0514   AST 16 04/21/2014 0514   ALT 9 04/21/2014 0514   ALKPHOS 54 04/21/2014 0514   BILITOT 0.6 04/21/2014 0514   GFRNONAA 9* 04/21/2014 0514   GFRAA 11* 04/21/2014 0514   Lipase  No results found for this basename: lipase       Studies/Results: No results found.  Anti-infectives: Anti-infectives   Start     Dose/Rate Route Frequency Ordered Stop   04/21/14 1200  ciprofloxacin (CIPRO) IVPB 400 mg  Status:  Discontinued     400 mg 200 mL/hr over 60 Minutes Intravenous Every 24 hours 04/20/14 1045 04/20/14 1246   04/20/14 1400  piperacillin-tazobactam (ZOSYN) IVPB 2.25 g     2.25 g 100 mL/hr over 30 Minutes Intravenous 3 times per day 04/20/14 1258     04/20/14 1045  ciprofloxacin (CIPRO) IVPB 400 mg  Status:  Discontinued     400 mg 200 mL/hr over 60 Minutes Intravenous NOW 04/20/14 1038 04/20/14 1246   04/20/14 1030  metroNIDAZOLE (FLAGYL) IVPB 500 mg  Status:  Discontinued     500 mg 100 mL/hr over 60 Minutes Intravenous Every 8 hours 04/20/14 1019 04/20/14 1246       Assessment/Plan 1. Diverticulitis with possible abscess vs loop of bowel 2. Bilateral lung transplant, on prograft and prednisone 3. Back pain, ? From diverticulitis  Plan: 1. Patient is on multiple immunosuppressants so his exams will not be reliable to determine how he is doing.  His abdominal exam is benign, but continues to complain of severe back pain.  I spoke to Dr. Vernard Gambles with IR about his scan  today.  He actually said he doesn't have much inflammation around his colon c/w with a mild case of tics.  He also felt the area measured as an abscess was possibly just a diverticula of the bowel, but couldn't definitively rule out an abscess.  Given his scan findings, it's hard to believe the patient is having such severe pain, especially on immunosuppressants.  Would continue conservative management with NPO and abx therapy for now. 2. Given the patient's transplant history, many of these surgeons like to manage their own patients.  May be beneficial to consider transfer to [name] for further care by the transplant team.  We will continue to follow for now.   LOS: 1 day    Henreitta Cea 04/21/2014, 9:23 AM Pager: [phone]

## 2014-04-22 LAB — RENAL FUNCTION PANEL
Albumin: 3.3 g/dL — ABNORMAL LOW (ref 3.5–5.2)
BUN: 73 mg/dL — AB (ref 6–23)
CO2: 26 meq/L (ref 19–32)
CREATININE: 5.91 mg/dL — AB (ref 0.50–1.35)
Calcium: 9.6 mg/dL (ref 8.4–10.5)
Chloride: 94 mEq/L — ABNORMAL LOW (ref 96–112)
GFR calc Af Amer: 10 mL/min — ABNORMAL LOW (ref >90)
GFR, EST NON AFRICAN AMERICAN: 9 mL/min — AB (ref >90)
GLUCOSE: 237 mg/dL — AB (ref 70–99)
Phosphorus: 6.8 mg/dL — ABNORMAL HIGH (ref 2.3–4.6)
Potassium: 5.5 mEq/L — ABNORMAL HIGH (ref 3.7–5.3)
Sodium: 139 mEq/L (ref 137–147)

## 2014-04-22 LAB — CBC WITH DIFFERENTIAL/PLATELET
Basophils Absolute: 0 10*3/uL (ref 0.0–0.1)
Basophils Relative: 0 % (ref 0–1)
Eosinophils Absolute: 0.1 10*3/uL (ref 0.0–0.7)
Eosinophils Relative: 1 % (ref 0–5)
HCT: 28.8 % — ABNORMAL LOW (ref 39.0–52.0)
HEMOGLOBIN: 9.7 g/dL — AB (ref 13.0–17.0)
LYMPHS PCT: 11 % — AB (ref 12–46)
Lymphs Abs: 0.8 10*3/uL (ref 0.7–4.0)
MCH: 32.7 pg (ref 26.0–34.0)
MCHC: 33.7 g/dL (ref 30.0–36.0)
MCV: 97 fL (ref 78.0–100.0)
MONOS PCT: 9 % (ref 3–12)
Monocytes Absolute: 0.6 10*3/uL (ref 0.1–1.0)
Neutro Abs: 5.9 10*3/uL (ref 1.7–7.7)
Neutrophils Relative %: 79 % — ABNORMAL HIGH (ref 43–77)
PLATELETS: 148 10*3/uL — AB (ref 150–400)
RBC: 2.97 MIL/uL — ABNORMAL LOW (ref 4.22–5.81)
RDW: 14.5 % (ref 11.5–15.5)
WBC: 7.4 10*3/uL (ref 4.0–10.5)

## 2014-04-22 LAB — GLUCOSE, CAPILLARY
GLUCOSE-CAPILLARY: 158 mg/dL — AB (ref 70–99)
GLUCOSE-CAPILLARY: 228 mg/dL — AB (ref 70–99)
Glucose-Capillary: 229 mg/dL — ABNORMAL HIGH (ref 70–99)
Glucose-Capillary: 137 mg/dL — ABNORMAL HIGH (ref 70–99)

## 2014-04-22 MED ORDER — CIPROFLOXACIN HCL 500 MG PO TABS
500.0000 mg | ORAL_TABLET | Freq: Every day | ORAL | Status: DC
  Administered 2014-04-22 – 2014-04-23 (×2): 500 mg via ORAL
  Filled 2014-04-22 (×4): qty 1

## 2014-04-22 MED ORDER — OXYCODONE HCL 5 MG PO TABS
5.0000 mg | ORAL_TABLET | ORAL | Status: DC | PRN
  Administered 2014-04-22: 5 mg via ORAL
  Filled 2014-04-22: qty 1

## 2014-04-22 MED ORDER — METRONIDAZOLE 500 MG PO TABS
500.0000 mg | ORAL_TABLET | Freq: Three times a day (TID) | ORAL | Status: DC
  Administered 2014-04-22 – 2014-04-24 (×7): 500 mg via ORAL
  Filled 2014-04-22 (×9): qty 1

## 2014-04-22 NOTE — Progress Notes (Signed)
Patient ID: Tyler Mueller, male   DOB: 07-Sep-1941, 73 y.o.   MRN: [id number] Attending physician note: Patient interviewed and examined along with the health care team. No evidence at this point to support acute diverticulitis. Abdominal exam is benign. Main symptoms relate to paraspinal muscle spasm which is now subsiding significantly. We will advance his diet. [name], M.D., [place]

## 2014-04-22 NOTE — Progress Notes (Signed)
Patient ID: Tyler Mueller, male   DOB: Feb 23, 1941, 73 y.o.   MRN: [id number]    Subjective: Pt feels much better today after muscle relaxants yesterday for his back pain.  He denies abdominal pain  Objective: Vital signs in last 24 hours: Temp:  [98.3 F (36.8 C)-98.6 F (37 C)] 98.3 F (36.8 C) (05/19 0612) Pulse Rate:  [69-98] 98 (05/19 0612) Resp:  [17-18] 18 (05/19 0612) BP: (161-196)/(66-83) 190/83 mmHg (05/19 0612) SpO2:  [93 %-96 %] 93 % (05/19 0612) Weight:  [163 lb 9.3 oz (74.2 kg)] 163 lb 9.3 oz (74.2 kg) (05/19 0612) Last BM Date: 04/19/14  Intake/Output from previous day: 05/18 0701 - 05/19 0700 In: 581.3 [I.V.:581.3] Out: 950 [Urine:950] Intake/Output this shift:    PE: Abd: soft, NT, Nd, +BS  Lab Results:   Recent Labs  04/21/14 0514 04/22/14 0635  WBC 9.4 7.4  HGB 10.0* 9.7*  HCT 31.0* 28.8*  PLT 161 148*   BMET  Recent Labs  04/21/14 0514 04/22/14 0635  NA 137 139  K 5.6* 5.5*  CL 97 94*  CO2 25 26  GLUCOSE 113* 237*  BUN 66* 73*  CREATININE 5.65* 5.91*  CALCIUM 10.1 9.6   PT/INR No results found for this basename: LABPROT, INR,  in the last 72 hours CMP     Component Value Date/Time   NA 139 04/22/2014 0635   K 5.5* 04/22/2014 0635   CL 94* 04/22/2014 0635   CO2 26 04/22/2014 0635   GLUCOSE 237* 04/22/2014 0635   GLUCOSE 104* 10/31/2006 0755   BUN 73* 04/22/2014 0635   CREATININE 5.91* 04/22/2014 0635   CALCIUM 9.6 04/22/2014 0635   CALCIUM 8.5 05/09/2011 1339   PROT 7.0 04/21/2014 0514   ALBUMIN 3.3* 04/22/2014 0635   AST 16 04/21/2014 0514   ALT 9 04/21/2014 0514   ALKPHOS 54 04/21/2014 0514   BILITOT 0.6 04/21/2014 0514   GFRNONAA 9* 04/22/2014 0635   GFRAA 10* 04/22/2014 0635   Lipase  No results found for this basename: lipase       Studies/Results: No results found.  Anti-infectives: Anti-infectives   Start     Dose/Rate Route Frequency Ordered Stop   04/21/14 1200  ciprofloxacin (CIPRO) IVPB 400 mg  Status:   Discontinued     400 mg 200 mL/hr over 60 Minutes Intravenous Every 24 hours 04/20/14 1045 04/20/14 1246   04/20/14 1400  piperacillin-tazobactam (ZOSYN) IVPB 2.25 g     2.25 g 100 mL/hr over 30 Minutes Intravenous 3 times per day 04/20/14 1258     04/20/14 1045  ciprofloxacin (CIPRO) IVPB 400 mg  Status:  Discontinued     400 mg 200 mL/hr over 60 Minutes Intravenous NOW 04/20/14 1038 04/20/14 1246   04/20/14 1030  metroNIDAZOLE (FLAGYL) IVPB 500 mg  Status:  Discontinued     500 mg 100 mL/hr over 60 Minutes Intravenous Every 8 hours 04/20/14 1019 04/20/14 1246       Assessment/Plan  1. Back pain, musculoskeletal 2. ? Mild diverticulitis on scan, unlikely to have abscess  Plan: 1. Given prednisone and prograft, wound recommend completing at least a 7 day course of abx therapy for mild diverticulitis.  May transition to oral augmentin 2. Will give clear liquids today and advance as tolerates to low fiber diet.   LOS: 2 days    Henreitta Cea 04/22/2014, 10:04 AM Pager: [phone]

## 2014-04-22 NOTE — Progress Notes (Signed)
Subjective:  Awake this AM says pain is better but he just doesn't feel good. Creatinine increased some- UOP adequate Objective Vital signs in last 24 hours: Filed Vitals:   04/21/14 0526 04/21/14 1443 04/21/14 2042 04/22/14 0612  BP: 161/69 161/66 196/73 190/83  Pulse: 75 69 86 98  Temp: 97.9 F (36.6 C) 98.4 F (36.9 C) 98.6 F (37 C) 98.3 F (36.8 C)  TempSrc:  Oral Oral Oral  Resp: 18 17 17 18   Height:      Weight: 74.568 kg (164 lb 6.3 oz)   74.2 kg (163 lb 9.3 oz)  SpO2: 99% 96% 96% 93%   Weight change: 1.171 kg (2 lb 9.3 oz)  Intake/Output Summary (Last 24 hours) at 04/22/14 1211 Last data filed at 04/22/14 [id number]  Gross per 24 hour  Intake 581.25 ml  Output    950 ml  Net -368.75 ml    Assessment/ Plan: Pt is a 73 y.o. yo male who was admitted on 04/20/2014 with abdominal/back pain - possible intraabdominal abscess- also with advanced CKD at baseline Assessment/Plan: 1. Renal- according to wife- baseline CKD with creatinine as high as 5.  Probably secondary to prograf toxicity from lung transplants.  He has an AVF in place- no indications for dialysis right now but we will continue to follow.  Slightly worrisome that his creatinine is trending up- might be difficult to tell if he is uremic 2. Abdominal /back pain- possible diverticulitis with abscess- surgery following- conservative management for now- on zosyn - needing much pain meds but seems to be trending better 3. Anemia- not too significant at this time- no procrit as OP 4. Secondary hyperparathyroidism- on home phoslo when taking POs again 5. HTN/volume- getting IVF - will stop IVF given hypertension 6. Hyperkalemia-  - stable and slightly up- will follow  Tyler Mueller    Labs: Basic Metabolic Panel:  Recent Labs Lab 04/20/14 0825 04/21/14 0514 04/22/14 0635  NA 141 137 139  K 5.0 5.6* 5.5*  CL 99 97 94*  CO2 25 25 26   GLUCOSE 154* 113* 237*  BUN 61* 66* 73*  CREATININE 5.58* 5.65* 5.91*   CALCIUM 10.5 10.1 9.6  PHOS 4.9*  --  6.8*   Liver Function Tests:  Recent Labs Lab 04/20/14 0825 04/21/14 0514 04/22/14 0635  AST 17 16  --   ALT 10 9  --   ALKPHOS 61 54  --   BILITOT 0.5 0.6  --   PROT 7.6 7.0  --   ALBUMIN 3.7 3.3* 3.3*   No results found for this basename: LIPASE, AMYLASE,  in the last 168 hours No results found for this basename: AMMONIA,  in the last 168 hours CBC:  Recent Labs Lab 04/20/14 0825 04/21/14 0514 04/22/14 0635  WBC 10.0 9.4 7.4  NEUTROABS 7.7  --  5.9  HGB 11.0* 10.0* 9.7*  HCT 32.4* 31.0* 28.8*  MCV 97.3 99.4 97.0  PLT 156 161 148*   Cardiac Enzymes: No results found for this basename: CKTOTAL, CKMB, CKMBINDEX, TROPONINI,  in the last 168 hours CBG:  Recent Labs Lab 04/21/14 1211 04/21/14 1706 04/21/14 2114 04/22/14 0733 04/22/14 1206  GLUCAP 122* 128* 201* 229* 158*    Iron Studies: No results found for this basename: IRON, TIBC, TRANSFERRIN, FERRITIN,  in the last 72 hours Studies/Results: No results found. Medications: Infusions: .  sodium bicarbonate  infusion 1000 mL 75 mL/hr at 04/21/14 1535    Scheduled Medications: . aspirin EC  81 mg Oral Daily  . calcium acetate  667 mg Oral BID WC  . carvedilol  25 mg Oral BID WC  . ciprofloxacin  500 mg Oral q1800  . doxazosin  4 mg Oral Daily  . famotidine  20 mg Oral Daily  . insulin aspart  0-9 Units Subcutaneous TID WC  . metroNIDAZOLE  500 mg Oral 3 times per day  . predniSONE  5 mg Oral BID WC  . sodium bicarbonate  1,300 mg Oral TID  . sodium chloride  3 mL Intravenous Q12H  . tacrolimus  1 mg Oral Daily    have reviewed scheduled and prn medications.  Physical Exam: General: awake and alert Heart:  RRR Lungs: mostly clear Abdomen: distended  Extremities: no edema Left sided AVF would be ready to use    04/22/2014,12:11 PM  LOS: 2 days

## 2014-04-22 NOTE — Progress Notes (Addendum)
Subjective:  Back pain much improved this am. No abdominal complaints at this time.  Objective: Vital signs in last 24 hours: Filed Vitals:   04/21/14 0526 04/21/14 1443 04/21/14 2042 04/22/14 0612  BP: 161/69 161/66 196/73 190/83  Pulse: 75 69 86 98  Temp: 97.9 F (36.6 C) 98.4 F (36.9 C) 98.6 F (37 C) 98.3 F (36.8 C)  TempSrc:  Oral Oral Oral  Resp: 18 17 17 18   Height:      Weight: 164 lb 6.3 oz (74.568 kg)   163 lb 9.3 oz (74.2 kg)  SpO2: 99% 96% 96% 93%   Weight change: 2 lb 9.3 oz (1.171 kg)  Intake/Output Summary (Last 24 hours) at 04/22/14 0838 Last data filed at 04/22/14 0655  Gross per 24 hour  Intake 581.25 ml  Output    950 ml  Net -368.75 ml   Physical Exam  Constitutional: He is oriented to person, place, and time.  Abdominal: Soft. Bowel sounds are normal. He exhibits no distension. There is no tenderness. There is no rebound and no guarding.  Musculoskeletal: He exhibits tenderness.  The patients back pain is much improved this am.  Neurological: He is alert and oriented to person, place, and time.  Psychiatric: He has a normal mood and affect. His behavior is normal.    Lab Results: Basic Metabolic Panel:  Recent Labs Lab 04/20/14 0825 04/21/14 0514 04/22/14 0635  NA 141 137 139  K 5.0 5.6* 5.5*  CL 99 97 94*  CO2 25 25 26   GLUCOSE 154* 113* 237*  BUN 61* 66* 73*  CREATININE 5.58* 5.65* 5.91*  CALCIUM 10.5 10.1 9.6  MG 2.1  --   --   PHOS 4.9*  --  6.8*   Liver Function Tests:  Recent Labs Lab 04/20/14 0825 04/21/14 0514 04/22/14 0635  AST 17 16  --   ALT 10 9  --   ALKPHOS 61 54  --   BILITOT 0.5 0.6  --   PROT 7.6 7.0  --   ALBUMIN 3.7 3.3* 3.3*   CBC:  Recent Labs Lab 04/20/14 0825 04/21/14 0514 04/22/14 0635  WBC 10.0 9.4 7.4  NEUTROABS 7.7  --  5.9  HGB 11.0* 10.0* 9.7*  HCT 32.4* 31.0* 28.8*  MCV 97.3 99.4 97.0  PLT 156 161 148*   CBG:  Recent Labs Lab 04/20/14 2208 04/21/14 0810 04/21/14 1211  04/21/14 1706 04/21/14 2114 04/22/14 0733  GLUCAP 107* 120* 122* 128* 201* 229*   Urinalysis:  Recent Labs Lab 04/20/14 1059  COLORURINE YELLOW  LABSPEC 1.015  PHURINE 7.0  GLUCOSEU 100*  HGBUR TRACE*  BILIRUBINUR NEGATIVE  KETONESUR NEGATIVE  PROTEINUR 100*  UROBILINOGEN 0.2  NITRITE NEGATIVE  LEUKOCYTESUR NEGATIVE   Studies/Results: Ct Abdomen Pelvis Wo Contrast  04/20/2014   CLINICAL DATA:  Back pain. Remote history of lung transplant. Pre dialysis.  EXAM: CT ABDOMEN AND PELVIS WITHOUT CONTRAST  TECHNIQUE: Multidetector CT imaging of the abdomen and pelvis was performed following the standard protocol without IV contrast.  COMPARISON:  No similar prior exam is available at this institution for comparison or on [facility].  FINDINGS: Scarring is noted at the lung bases. Trace bilateral pleural fluid. Evidence of median sternotomy. Cardiomegaly noted.  Calcified granulomas are noted in the spleen. Unenhanced liver, gallbladder, adrenal glands, spleen are unremarkable. Nonobstructing bilateral renal calculi are noted, largest 5 mm right upper renal pole. 2.1 cm exophytic right mid renal cortical cyst noted. No hydroureteronephrosis. No radiopaque  ureteral or bladder calculus. No ascites or lymphadenopathy or free air. Severe atheromatous aortic calcification without aneurysm.  Areas of stranding within the anterior abdominal wall subcutaneous fat likely reflect injection sites.  There is focal sigmoid colonic wall thickening image 72 with an adjacent thick-walled pericolonic abscess or dilated diverticulum containing air-fluid level measuring 4.4 cm image 67. The remainder of the large bowel and small bowel appear unremarkable. No pelvic free fluid or lymphadenopathy. Bladder is normal. No acute osseous abnormality. The degenerative change identified in the spine. Minimal superior endplate compression deformities at L2 and L4 are noted which could reflect disc degenerative change/ Schmorl  node formation, without bony retropulsion.  IMPRESSION: Short segmental sigmoid colonic wall thickening with associated 4.4 cm contiguous pericolonic abscess or dilated diverticulum, compatible with acute diverticulitis. These results were called by telephone at the time of interpretation on 04/20/2014 at 9:13 AM to Dr. Dorie Rank , who verbally acknowledged these results.   Electronically Signed   By: Conchita Paris M.D.   On: 04/20/2014 09:13   Medications: I have reviewed the patient's current medications. Scheduled Meds: . aspirin EC  81 mg Oral Daily  . calcium acetate  667 mg Oral BID WC  . carvedilol  25 mg Oral BID WC  . doxazosin  4 mg Oral Daily  . famotidine  20 mg Oral Daily  . insulin aspart  0-9 Units Subcutaneous TID WC  . piperacillin-tazobactam (ZOSYN)  IV  2.25 g Intravenous 3 times per day  . predniSONE  5 mg Oral BID WC  . sodium bicarbonate  1,300 mg Oral TID  . sodium chloride  3 mL Intravenous Q12H  . tacrolimus  1 mg Oral Daily   Continuous Infusions: .  sodium bicarbonate  infusion 1000 mL 75 mL/hr at 04/21/14 1535   PRN Meds:.acetaminophen, acetaminophen, HYDROmorphone (DILAUDID) injection, ondansetron (ZOFRAN) IV, ondansetron Assessment/Plan: Principal Problem:   Diverticulitis Active Problems:   DIABETES MELLITUS   Complications of transplanted lung   CKD (chronic kidney disease) stage 5, GFR less than 15 ml/min   Muscle spasm of back   History of lung transplant  Possible Pericolonic Abscess 2/2 possible diverticulitis  The patient has a benign abdominal exam. However, a diminished response due to immunosuppression is possible. There was a question of a pericolonic abscess on admission. The patient was made NPO and treated with Flagyl Cipro and now Zosyn. General surgery rec to treat conservatively with NPO and IV abx on admission. On review of the CT abdomen there was very little evidence of inflammation. Thus, there may be very mild diverticulitis.  Further, the possible abscess appeared to be more likely a large diverticula without complication, but abscess could not be ruled out. I spoke with Dr. Pearline Cables who is the attending on call for the [facility] lung transplant team. Given the patients benign physical exam and back pain consistent with MSK pain, Dr. Pearline Cables did not recommend transfer at this time. Should the patients condition change she welcomed further consultation.  - cipro flagyl IV in the ED and now zosyn, d/c oral abx will start PO abx per surgery. Patient has penicillin allergy, thus plan to start PO cipro and flagyl. - ABX per pharm given ESRD  - consult general surgery for rec's  - clears and advance as tolerated per surgery - d/c IV narcotics  Back Pain- nearly resolved The patients back pain appears most likely to be secondary to muscle spasm. The paraspinal muscles are tender to palpation and have increased  tone. As patient is NPO, there are limited options for IV treatment.  IV BZD (1 mg ativan) was given on 5/19. The patient became sedated and rested comfortably overnight. He woke up last night and was confused about where he was. He was easily redirected. This am he is alert and oriented to person place and time. His back pain is nearly resolved. He states that he is feeling much better.  Of note the patient takes 5 mg oxycodone IR as needed for chronic pain. He typically takes 2 tablets daily. Other possible causes of the patients pain are compression fracture, radiculopathy. There are no signs of nerve of spinal cord involvement at this time. CT was negative for fracture.  - discontinue IV ativan 1 mg - d/c dilaudid 0.5 mg IV q 2 hr prn. Plan to deescalate to home narcotics regimen as soon as surgery rec's the patient to tolerate oral meds.  End stage CKD (GFR 9), G5  Patients Cr 5.6 on admission. Baseline 4-4.8 (per care everywhere). Patient has fistula that has never been used. Primary nephrologist is Dr. Marval Regal. Likely  represents acute on chronic renal failure due to decreased PO intake and volume depletion.   - Consult Renal, appreciate rec's   Bronchiectasis s/p double lung transplant  The patient is compliant with immunosuppressive regimen. He reports that everything has been going well. No evidence of chest complaints at this time. Plan to discuss case with Lung transplant team and [facility]. - continue home meds  - tacrolimus 1 mg daily  - prednisone 5 mg daily   DM  Stable. Hold home meds.  -SSI   HTN  Moderately htn on admission. Likely due to acute pain. Patient continued to be htn up to 196/93. - continue home coreg  - consider escalting HTN regimen (add amlodipine 5 mg qd)  GERD  - continue H2 blocker   BPH  - continue prazosin    Dispo: Disposition is deferred at this time, awaiting improvement of current medical problems.  Anticipated discharge in approximately 1-2 day(s).   The patient does have a current PCP (John Mendel Corning, MD) and does not need an [facility] hospital follow-up appointment after discharge.  The patient does not have transportation limitations that hinder transportation to clinic appointments.  .Services Needed at time of discharge: Y = Yes, Blank = No PT:   OT:   RN:   Equipment:   Other:     LOS: 2 days   Marrion Coy, MD 04/22/2014, 8:38 AM

## 2014-04-22 NOTE — Evaluation (Signed)
Physical Therapy Evaluation Patient Details Name: Tyler Mueller MRN: [id number] DOB: 13-Sep-1941 Today's Date: 04/22/2014   History of Present Illness  Tyler Mueller is a 73 y.o. man with a pmhx of double lung transplant (11 years), diverticulitis (3 years ago), CKD V (not on HD) who presents to the hospital on 5/17 w/ a cc of back pain. The patient was in his normal state of health until three days ago when he developed flank pain after playing a round of golf. He has since developed worsening back and flank pain. He has associated symptoms of nausea and constipation. Pt given ativan 5/18 and has been confused since.  Clinical Impression  Pt presenting with confusion and back pain greatly limiting ambulation tolerance this date. Pt wife present since 9am and reports he wasn't confused until now. Pt perseverating on hearing test and becoming agitated with lines attempted to pull off tele leads. PT to con't to follow to further assess OOB mobility as able. Suspect once cognition clears pt to progress functionally well enough to return home with spouse.    Follow Up Recommendations No PT follow up;Supervision/Assistance - 24 hour    Equipment Recommendations  None recommended by PT    Recommendations for Other Services       Precautions / Restrictions Precautions Precautions: Fall Precaution Comments: confusion Restrictions Weight Bearing Restrictions: No      Mobility  Bed Mobility Overal bed mobility: Needs Assistance Bed Mobility: Supine to Sit     Supine to sit: Supervision     General bed mobility comments: supervision for safety, pt mildly impulsive, onset of back pain and nausea  Transfers Overall transfer level: Needs assistance Equipment used: None Transfers: Sit to/from Stand Sit to Stand: Min guard         General transfer comment: pt impulsive, instantly reports "I can't do this, i'm going to throw up" pt impulsively returned to sitting EOB. pt stood back  up and took 2 steps to [facility]  Ambulation/Gait Ambulation/Gait assistance:  (pt refused and return to sidelying in bed)              Stairs            Wheelchair Mobility    Modified Rankin (Stroke Patients Only)       Balance                                             Pertinent Vitals/Pain C/o back pain but didn't rate    Home Living Family/patient expects to be discharged to:: Private residence Living Arrangements: Spouse/significant other Available Help at Discharge: Family;Available 24 hours/day Type of Home: House Home Access: Stairs to enter Entrance Stairs-Rails: None Entrance Stairs-Number of Steps: 1 Home Layout: One level Home Equipment: Shower seat;Grab bars - tub/shower;Hand held shower head      Prior Function Level of Independence: Independent         Comments: pt was golfing     Hand Dominance   Dominant Hand: Right    Extremity/Trunk Assessment   Upper Extremity Assessment: Overall WFL for tasks assessed           Lower Extremity Assessment: Overall WFL for tasks assessed      Cervical / Trunk Assessment: Normal  Communication   Communication: No difficulties  Cognition Arousal/Alertness: Awake/alert Behavior During Therapy: Restless Overall Cognitive Status: Impaired/Different from  baseline Area of Impairment: Orientation;Attention;Memory;Following commands;Safety/judgement;Awareness;Problem solving Orientation Level: Disoriented to;Place;Time;Situation Current Attention Level: Focused Memory: Decreased short-term memory;Decreased recall of precautions Following Commands: Follows one step commands with increased time;Follows one step commands inconsistently Safety/Judgement: Decreased awareness of safety;Decreased awareness of deficits Awareness: Emergent Problem Solving: Slow processing;Requires verbal cues;Requires tactile cues General Comments: upon PT arrival pt perseverating on "hearing test"  and wife being there for a job interview. Pt re-oriented several time however unable to recall    General Comments      Exercises        Assessment/Plan    PT Assessment Patient needs continued PT services  PT Diagnosis Difficulty walking;Acute pain   PT Problem List Decreased strength;Decreased activity tolerance;Decreased mobility  PT Treatment Interventions DME instruction;Gait training;Functional mobility training;Therapeutic activities;Therapeutic exercise   PT Goals (Current goals can be found in the Care Plan section) Acute Rehab PT Goals PT Goal Formulation: With patient Time For Goal Achievement: 04/29/14 Potential to Achieve Goals: Good    Frequency Min 3X/week   Barriers to discharge        Co-evaluation               End of Session Equipment Utilized During Treatment: Gait belt Activity Tolerance:  (limited by confusion and back pain) Patient left: in bed;with call bell/phone within reach;with bed alarm set;with family/visitor present Nurse Communication: Mobility status (increased confusion)         Time: [id number] PT Time Calculation (min): 17 min   Charges:   PT Evaluation $Initial PT Evaluation Tier I: 1 Procedure PT Treatments $Therapeutic Activity: 8-22 mins   PT G Codes:          Fuad Forget M Sabri Teal 04/22/2014, 2:38 PM  Kittie Plater, PT, DPT Pager #: [phone] Office #: [phone]

## 2014-04-22 NOTE — Progress Notes (Signed)
Clears, Advance as tolerated No acute indications for surgery.  Imogene Burn. Georgette Dover, MD, [facility] Surgery  General/ Trauma Surgery  04/22/2014 12:39 PM

## 2014-04-23 DIAGNOSIS — K5732 Diverticulitis of large intestine without perforation or abscess without bleeding: Secondary | ICD-10-CM

## 2014-04-23 DIAGNOSIS — B029 Zoster without complications: Secondary | ICD-10-CM | POA: Diagnosis not present

## 2014-04-23 DIAGNOSIS — I120 Hypertensive chronic kidney disease with stage 5 chronic kidney disease or end stage renal disease: Secondary | ICD-10-CM

## 2014-04-23 LAB — CBC
HCT: 30.3 % — ABNORMAL LOW (ref 39.0–52.0)
Hemoglobin: 10.2 g/dL — ABNORMAL LOW (ref 13.0–17.0)
MCH: 32.8 pg (ref 26.0–34.0)
MCHC: 33.7 g/dL (ref 30.0–36.0)
MCV: 97.4 fL (ref 78.0–100.0)
PLATELETS: 141 10*3/uL — AB (ref 150–400)
RBC: 3.11 MIL/uL — AB (ref 4.22–5.81)
RDW: 14.5 % (ref 11.5–15.5)
WBC: 12 10*3/uL — ABNORMAL HIGH (ref 4.0–10.5)

## 2014-04-23 LAB — RENAL FUNCTION PANEL
Albumin: 3.4 g/dL — ABNORMAL LOW (ref 3.5–5.2)
BUN: 72 mg/dL — ABNORMAL HIGH (ref 6–23)
CO2: 28 mEq/L (ref 19–32)
Calcium: 10 mg/dL (ref 8.4–10.5)
Chloride: 96 mEq/L (ref 96–112)
Creatinine, Ser: 5.9 mg/dL — ABNORMAL HIGH (ref 0.50–1.35)
GFR calc non Af Amer: 9 mL/min — ABNORMAL LOW (ref >90)
GFR, EST AFRICAN AMERICAN: 10 mL/min — AB (ref >90)
Glucose, Bld: 211 mg/dL — ABNORMAL HIGH (ref 70–99)
PHOSPHORUS: 6.6 mg/dL — AB (ref 2.3–4.6)
Potassium: 5.1 mEq/L (ref 3.7–5.3)
Sodium: 143 mEq/L (ref 137–147)

## 2014-04-23 LAB — GLUCOSE, CAPILLARY
GLUCOSE-CAPILLARY: 183 mg/dL — AB (ref 70–99)
GLUCOSE-CAPILLARY: 187 mg/dL — AB (ref 70–99)
Glucose-Capillary: 198 mg/dL — ABNORMAL HIGH (ref 70–99)
Glucose-Capillary: 216 mg/dL — ABNORMAL HIGH (ref 70–99)

## 2014-04-23 MED ORDER — TACROLIMUS 0.5 MG PO CAPS
0.5000 mg | ORAL_CAPSULE | Freq: Two times a day (BID) | ORAL | Status: DC
  Administered 2014-04-24 – 2014-04-28 (×9): 0.5 mg via ORAL
  Filled 2014-04-23 (×10): qty 1

## 2014-04-23 MED ORDER — AMLODIPINE BESYLATE 10 MG PO TABS: 10.0000 mg | ORAL_TABLET | Freq: Every day | ORAL | Status: DC

## 2014-04-23 MED ORDER — ACYCLOVIR 200 MG PO CAPS
800.0000 mg | ORAL_CAPSULE | Freq: Two times a day (BID) | ORAL | Status: DC
  Administered 2014-04-24: 800 mg via ORAL
  Filled 2014-04-23 (×3): qty 4

## 2014-04-23 MED ORDER — SODIUM CHLORIDE 0.9 % IV SOLN
INTRAVENOUS | Status: DC
  Administered 2014-04-23: 14:00:00 via INTRAVENOUS

## 2014-04-23 MED ORDER — AMLODIPINE BESYLATE 10 MG PO TABS
10.0000 mg | ORAL_TABLET | Freq: Every day | ORAL | Status: DC
  Administered 2014-04-24 – 2014-04-28 (×5): 10 mg via ORAL
  Filled 2014-04-23 (×5): qty 1

## 2014-04-23 MED ORDER — AMLODIPINE BESYLATE 5 MG PO TABS
5.0000 mg | ORAL_TABLET | Freq: Every day | ORAL | Status: AC
  Administered 2014-04-23: 5 mg via ORAL
  Filled 2014-04-23 (×2): qty 1

## 2014-04-23 MED ORDER — DEXTROSE 5 % IV SOLN
5.0000 mg/kg | INTRAVENOUS | Status: AC
  Administered 2014-04-23: 370 mg via INTRAVENOUS
  Filled 2014-04-23 (×2): qty 7.4

## 2014-04-23 MED ORDER — AMLODIPINE BESYLATE 5 MG PO TABS
5.0000 mg | ORAL_TABLET | Freq: Every day | ORAL | Status: DC
  Administered 2014-04-23: 5 mg via ORAL
  Filled 2014-04-23: qty 1

## 2014-04-23 NOTE — Progress Notes (Signed)
Pharmacy: Tacrolimus Dose Clarification  Upon rounding on this patient today - noted odd dosing of Prograf 1 mg once daily (almost always given twice daily). When discussed with the patient, he confirmed this dosing however the wife thought it was one of his twice daily medications. We called the [facility] transplant clinic and the following was verified:  Tacrolimus (Prograf) dose: 0.5 mg twice daily Goal level: 4-6 ng/mL Last level from 4/23: 5.5 ng/ml (therapeutic)  It was discussed with the rounding team and the patient's dose will be adjusted this admission to reflect this dose.  Alycia Rossetti, PharmD, BCPS Clinical Pharmacist Pager: [phone] 04/23/2014 2:50 PM

## 2014-04-23 NOTE — Progress Notes (Signed)
Subjective: Pt feeling bad currently.  He says his pain came back last night.  He's been asking for more pain meds.  Pain in left flank and left lateral abdomen.  No N/V.  Having BM's and flatus.  Anorexia but tolerating some fulls.    Objective: Vital signs in last 24 hours: Temp:  [97.5 F (36.4 C)-99 F (37.2 C)] 97.5 F (36.4 C) (05/20 0534) Pulse Rate:  [90-95] 95 (05/20 0534) Resp:  [18-19] 18 (05/20 0534) BP: (168-186)/(69-81) 168/69 mmHg (05/20 0534) SpO2:  [87 %-92 %] 91 % (05/20 0534) Weight:  [163 lb (73.936 kg)-163 lb 9.3 oz (74.2 kg)] 163 lb 9.3 oz (74.2 kg) (05/20 0534) Last BM Date: 04/22/14  Intake/Output from previous day: 05/19 0701 - 05/20 0700 In: 1131.8 [I.V.:1131.8] Out: -  Intake/Output this shift:    PE: Gen:  Alert, NAD, pleasant Abd: Soft, ND, mod pain in LLQ, mild pain diffusely on left most lateral portion of his abdomen and TTP on left flank, +BS, no HSM  Lab Results:   Recent Labs  04/21/14 0514 04/22/14 0635  WBC 9.4 7.4  HGB 10.0* 9.7*  HCT 31.0* 28.8*  PLT 161 148*   BMET  Recent Labs  04/22/14 0635 04/23/14 0550  NA 139 143  K 5.5* 5.1  CL 94* 96  CO2 26 28  GLUCOSE 237* 211*  BUN 73* 72*  CREATININE 5.91* 5.90*  CALCIUM 9.6 10.0   PT/INR No results found for this basename: LABPROT, INR,  in the last 72 hours CMP     Component Value Date/Time   NA 143 04/23/2014 0550   K 5.1 04/23/2014 0550   CL 96 04/23/2014 0550   CO2 28 04/23/2014 0550   GLUCOSE 211* 04/23/2014 0550   GLUCOSE 104* 10/31/2006 0755   BUN 72* 04/23/2014 0550   CREATININE 5.90* 04/23/2014 0550   CALCIUM 10.0 04/23/2014 0550   CALCIUM 8.5 05/09/2011 1339   PROT 7.0 04/21/2014 0514   ALBUMIN 3.4* 04/23/2014 0550   AST 16 04/21/2014 0514   ALT 9 04/21/2014 0514   ALKPHOS 54 04/21/2014 0514   BILITOT 0.6 04/21/2014 0514   GFRNONAA 9* 04/23/2014 0550   GFRAA 10* 04/23/2014 0550   Lipase  No results found for this basename: lipase        Studies/Results: No results found.  Anti-infectives: Anti-infectives   Start     Dose/Rate Route Frequency Ordered Stop   04/22/14 1800  ciprofloxacin (CIPRO) tablet 500 mg     500 mg Oral Daily-1800 04/22/14 1008     04/22/14 1400  metroNIDAZOLE (FLAGYL) tablet 500 mg     500 mg Oral 3 times per day 04/22/14 1008     04/21/14 1200  ciprofloxacin (CIPRO) IVPB 400 mg  Status:  Discontinued     400 mg 200 mL/hr over 60 Minutes Intravenous Every 24 hours 04/20/14 1045 04/20/14 1246   04/20/14 1400  piperacillin-tazobactam (ZOSYN) IVPB 2.25 g  Status:  Discontinued     2.25 g 100 mL/hr over 30 Minutes Intravenous 3 times per day 04/20/14 1258 04/22/14 1005   04/20/14 1045  ciprofloxacin (CIPRO) IVPB 400 mg  Status:  Discontinued     400 mg 200 mL/hr over 60 Minutes Intravenous NOW 04/20/14 1038 04/20/14 1246   04/20/14 1030  metroNIDAZOLE (FLAGYL) IVPB 500 mg  Status:  Discontinued     500 mg 100 mL/hr over 60 Minutes Intravenous Every 8 hours 04/20/14 1019 04/20/14 1246  Assessment/Plan 1. Back pain, musculoskeletal  2. ? Mild diverticulitis on scan, unlikely to have abscess   Plan:  1.  Given prednisone and prograft, wound recommend completing at least a 7 day course of abx therapy for mild diverticulitis. Started on Augmentin yesterday.  Pain worsened today, could be diverticulitis? since started on fulls yesterday and switching from IV to oral abx.   2.  Do not advance past fulls 3.  Repeat CBC, not likely to be elevated given home meds, but may need to back off diet or switch back to IV abx if abnormal     LOS: 3 days    Coralie Keens 04/23/2014, 9:15 AM Pager: [phone]

## 2014-04-23 NOTE — Progress Notes (Signed)
Patient ID: Tyler Mueller, male   DOB: 01-04-1941, 73 y.o.   MRN: [id number] Attending Physician Note: Episode of transient confusion/agitation  likely related to lorazepam which has been stopped. Etiology of left low back/lumbar pain now obvious.  Exam today shows Herpes Zoster eruption at approximate left T-12 dermatome.  We will initiate renal dose adjusted acyclovir. He is now tolerating PO diet but for first time since admission, he complains of lower abdominal discomfort. There is suprapubic and mild epigastric tenderness on exam. He was just changed from IV antibiotics to oral Cipro 5/19.  We will observe overnight [name], MD, FACP  Hematology-Oncology/Internal Medicine

## 2014-04-23 NOTE — Progress Notes (Signed)
Subjective:  Got confused some overnight- UOP not recorded creatinine down slightly - pt saying he doesn't feel like eating- wife is rubbing lotion on his back   Objective Vital signs in last 24 hours: Filed Vitals:   04/23/14 0500 04/23/14 0534 04/23/14 1036 04/23/14 1233  BP:  168/69 189/83 192/83  Pulse:  95  88  Temp:  97.5 F (36.4 C)  98.3 F (36.8 C)  TempSrc:  Oral  Oral  Resp:  18  17  Height:      Weight: 73.936 kg (163 lb) 74.2 kg (163 lb 9.3 oz)    SpO2:  91%  100%   Weight change: -0.264 kg (-9.3 oz)  Intake/Output Summary (Last 24 hours) at 04/23/14 1327 Last data filed at 04/23/14 1042  Gross per 24 hour  Intake 1134.75 ml  Output      0 ml  Net 1134.75 ml    Assessment/ Plan: Pt is a 73 y.o. yo male who was admitted on 04/20/2014 with abdominal/back pain - possible intraabdominal abscess- also with advanced CKD at baseline Assessment/Plan: 1. Renal- according to wife- baseline CKD with creatinine as high as 5.  Probably secondary to prograf toxicity from lung transplants.  He has an AVF in place- no indications for dialysis right now but we will continue to follow.   Might be difficult to tell if he is uremic in this situation 2. Abdominal /back pain- possible diverticulitis with abscess- surgery following but not thinking he needs surgery- just getting abx- conservative management for now- on zosyn - needing much pain meds but seems to be trending better 3. Anemia- not too significant at this time- no procrit as OP 4. Secondary hyperparathyroidism- on home phoslo when taking POs again 5. HTN/volume- have stopped IVF- is on home meds plus amlodipine 6. Hyperkalemia-  - stable and slightly up- will follow 7. Dispo- looking to transition him home be is confused and not eating the best- hoping this is due to pain/anziety meds he got during this hosp but cannot rule out uremia- i really dont know his baseline but his wife seems to think this is different from how he  usually is will see what he looks like tomorrow  Louis Meckel    Labs: Basic Metabolic Panel:  Recent Labs Lab 04/20/14 0825 04/21/14 0514 04/22/14 0635 04/23/14 0550  NA 141 137 139 143  K 5.0 5.6* 5.5* 5.1  CL 99 97 94* 96  CO2 25 25 26 28   GLUCOSE 154* 113* 237* 211*  BUN 61* 66* 73* 72*  CREATININE 5.58* 5.65* 5.91* 5.90*  CALCIUM 10.5 10.1 9.6 10.0  PHOS 4.9*  --  6.8* 6.6*   Liver Function Tests:  Recent Labs Lab 04/20/14 0825 04/21/14 0514 04/22/14 0635 04/23/14 0550  AST 17 16  --   --   ALT 10 9  --   --   ALKPHOS 61 54  --   --   BILITOT 0.5 0.6  --   --   PROT 7.6 7.0  --   --   ALBUMIN 3.7 3.3* 3.3* 3.4*   No results found for this basename: LIPASE, AMYLASE,  in the last 168 hours No results found for this basename: AMMONIA,  in the last 168 hours CBC:  Recent Labs Lab 04/20/14 0825 04/21/14 0514 04/22/14 0635 04/23/14 1155  WBC 10.0 9.4 7.4 12.0*  NEUTROABS 7.7  --  5.9  --   HGB 11.0* 10.0* 9.7* 10.2*  HCT 32.4* 31.0*  28.8* 30.3*  MCV 97.3 99.4 97.0 97.4  PLT 156 161 148* 141*   Cardiac Enzymes: No results found for this basename: CKTOTAL, CKMB, CKMBINDEX, TROPONINI,  in the last 168 hours CBG:  Recent Labs Lab 04/22/14 1206 04/22/14 1741 04/22/14 2056 04/23/14 0728 04/23/14 1210  GLUCAP 158* 228* 137* 198* 183*    Iron Studies: No results found for this basename: IRON, TIBC, TRANSFERRIN, FERRITIN,  in the last 72 hours Studies/Results: No results found. Medications: Infusions: . sodium chloride      Scheduled Medications: . amLODipine  5 mg Oral Daily  . aspirin EC  81 mg Oral Daily  . calcium acetate  667 mg Oral BID WC  . carvedilol  25 mg Oral BID WC  . ciprofloxacin  500 mg Oral q1800  . doxazosin  4 mg Oral Daily  . famotidine  20 mg Oral Daily  . insulin aspart  0-9 Units Subcutaneous TID WC  . metroNIDAZOLE  500 mg Oral 3 times per day  . predniSONE  5 mg Oral BID WC  . sodium bicarbonate  1,300 mg  Oral TID  . sodium chloride  3 mL Intravenous Q12H  . tacrolimus  1 mg Oral Daily    have reviewed scheduled and prn medications.  Physical Exam: General: awake and alert Heart:  RRR Lungs: mostly clear Abdomen: distended  Extremities: no edema Left sided AVF would be ready to use    04/23/2014,1:27 PM  LOS: 3 days

## 2014-04-23 NOTE — Progress Notes (Signed)
Subjective:  Mild back pain. Patient is tolerating PO w/o problems. 2 BMs over last 24 hours.   Objective: Vital signs in last 24 hours: Filed Vitals:   04/22/14 1422 04/22/14 2105 04/23/14 0500 04/23/14 0534  BP: 173/74 186/81  168/69  Pulse: 94 90  95  Temp: 99 F (37.2 C) 98.2 F (36.8 C)  97.5 F (36.4 C)  TempSrc: Oral Oral  Oral  Resp: 19 18  18   Height:      Weight:   163 lb (73.936 kg) 163 lb 9.3 oz (74.2 kg)  SpO2: 87% 92%  91%   Weight change: -9.3 oz (-0.264 kg)  Intake/Output Summary (Last 24 hours) at 04/23/14 [id number] Last data filed at 04/22/14 2059  Gross per 24 hour  Intake 1131.75 ml  Output      0 ml  Net 1131.75 ml   Physical Exam  Constitutional: He is oriented to person, place, and time.  Abdominal: Soft. Bowel sounds are normal. He exhibits no distension. There is no tenderness. There is no rebound and no guarding.  Musculoskeletal: He exhibits tenderness.  The patients back pain is much improved this am.  Neurological: He is alert and oriented to person, place, and time.  Psychiatric: He has a normal mood and affect. His behavior is normal.    Lab Results: Basic Metabolic Panel:  Recent Labs Lab 04/20/14 0825  04/22/14 0635 04/23/14 0550  NA 141  < > 139 143  K 5.0  < > 5.5* 5.1  CL 99  < > 94* 96  CO2 25  < > 26 28  GLUCOSE 154*  < > 237* 211*  BUN 61*  < > 73* 72*  CREATININE 5.58*  < > 5.91* 5.90*  CALCIUM 10.5  < > 9.6 10.0  MG 2.1  --   --   --   PHOS 4.9*  --  6.8* 6.6*  < > = values in this interval not displayed. Liver Function Tests:  Recent Labs Lab 04/20/14 0825 04/21/14 0514 04/22/14 0635 04/23/14 0550  AST 17 16  --   --   ALT 10 9  --   --   ALKPHOS 61 54  --   --   BILITOT 0.5 0.6  --   --   PROT 7.6 7.0  --   --   ALBUMIN 3.7 3.3* 3.3* 3.4*   CBC:  Recent Labs Lab 04/20/14 0825 04/21/14 0514 04/22/14 0635  WBC 10.0 9.4 7.4  NEUTROABS 7.7  --  5.9  HGB 11.0* 10.0* 9.7*  HCT 32.4* 31.0* 28.8*  MCV  97.3 99.4 97.0  PLT 156 161 148*   CBG:  Recent Labs Lab 04/21/14 2114 04/22/14 0733 04/22/14 1206 04/22/14 1741 04/22/14 2056 04/23/14 0728  GLUCAP 201* 229* 158* 228* 137* 198*   Urinalysis:  Recent Labs Lab 04/20/14 1059  COLORURINE YELLOW  LABSPEC 1.015  PHURINE 7.0  GLUCOSEU 100*  HGBUR TRACE*  BILIRUBINUR NEGATIVE  KETONESUR NEGATIVE  PROTEINUR 100*  UROBILINOGEN 0.2  NITRITE NEGATIVE  LEUKOCYTESUR NEGATIVE   Studies/Results: No results found. Medications: I have reviewed the patient's current medications. Scheduled Meds: . aspirin EC  81 mg Oral Daily  . calcium acetate  667 mg Oral BID WC  . carvedilol  25 mg Oral BID WC  . ciprofloxacin  500 mg Oral q1800  . doxazosin  4 mg Oral Daily  . famotidine  20 mg Oral Daily  . insulin aspart  0-9 Units Subcutaneous TID  WC  . metroNIDAZOLE  500 mg Oral 3 times per day  . predniSONE  5 mg Oral BID WC  . sodium bicarbonate  1,300 mg Oral TID  . sodium chloride  3 mL Intravenous Q12H  . tacrolimus  1 mg Oral Daily   Continuous Infusions:   PRN Meds:.acetaminophen, acetaminophen, ondansetron (ZOFRAN) IV, ondansetron Assessment/Plan: Principal Problem:   Diverticulitis Active Problems:   DIABETES MELLITUS   Complications of transplanted lung   CKD (chronic kidney disease) stage 5, GFR less than 15 ml/min   Muscle spasm of back   History of lung transplant  Mild diverticulitis - resolving The patient has a benign abdominal exam. However, a diminished response due to immunosuppression was possible. There was a question of a pericolonic abscess on admission CT. The patient was made NPO and treated with Flagyl Cipro and then Zosyn. General surgery rec to treat conservatively with NPO and IV abx on admission. On review of the CT abdomen there was very little evidence of inflammation. Thus, there may be very mild diverticulitis. Further, the possible abscess appeared to be more likely a large diverticula without  complicationt. I spoke with Dr. Pearline Cables who is the attending on call for the [facility] lung transplant team. Given the patients benign physical exam and back pain consistent with MSK pain, Dr. Pearline Cables did not recommend transfer at this time. Should the patients condition change she welcomed further consultation. Patients diet was advanced and he is tolerating with no problems. The abdominal exam remained benign through the entirety of the admission. - cipro flagyl IV in the ED and now zosyn, d/c oral abx will start PO abx per surgery for 1 week. Patient has penicillin allergy, thus plan to start PO cipro and flagyl. - ABX per pharm given ESRD  - consult general surgery for rec's  - clears and advance as tolerated per surgery - d/c IV narcotics  Back Pain- stable The patients back pain appears most likely to be secondary to muscle spasm/lumbar strain. The paraspinal muscles are tender to palpation and have increased tone. He became confused on ativan and IV narcotics. Plan to restart home regimen. Of note the patient takes 5 mg oxycodone IR as needed for chronic pain. Typcially takes 2 tabelts daily.  - discontinue IV ativan 1 mg - d/c dilaudid 0.5 mg IV q 2 hr prn. Plan to deescalate to home narcotics regimen as soon as surgery rec's the patient to tolerate oral meds.  End stage CKD (GFR 9), G5  Patients Cr 5.6 on admission, trended up 5.9. Baseline 4-4.8 (per care everywhere). Patient has fistula that has never been used. Primary nephrologist is Dr. Marval Regal. Likely represents acute on chronic renal failure due to decreased PO intake and volume depletion.   - Consult Renal, appreciate rec's   Bronchiectasis s/p double lung transplant  The patient is compliant with immunosuppressive regimen. He reports that everything has been going well. No evidence of chest complaints at this time. Plan to discuss case with Lung transplant team and [facility]. - continue home meds  - tacrolimus 1 mg daily  - prednisone 5 mg  daily   DM  Stable. Hold home meds.  -SSI   HTN  Moderately htn on admission. Likely due to acute pain. Patient continued to be htn up to 196/93. - continue home coreg  - on home doxazosin - consider escalting HTN regimen (add amlodipine 5 mg qd)  GERD  - continue H2 blocker   BPH  - continue prazosin  Dispo: Anticipate d/c alter today pending renal rec's.  The patient does have a current PCP (John Mendel Corning, MD) and does not need an [facility] hospital follow-up appointment after discharge.  The patient does not have transportation limitations that hinder transportation to clinic appointments.  .Services Needed at time of discharge: Y = Yes, Blank = No PT:   OT:   RN:   Equipment:   Other:     LOS: 3 days   Marrion Coy, MD 04/23/2014, 9:37 AM

## 2014-04-23 NOTE — Progress Notes (Signed)
Resting comfortably.  Mild LLQ tenderness.  No peritoneal signs.  Awaiting repeat CBC.  Continue abx, full liquids  Imogene Burn. Georgette Dover, MD, [facility] Surgery  General/ Trauma Surgery  04/23/2014 11:38 AM

## 2014-04-23 NOTE — Progress Notes (Signed)
ANTIBIOTIC CONSULT NOTE - INITIAL  Pharmacy Consult for Acyclovir Indication: Zoster at T10 dermatome  Allergies  Allergen Reactions  . Morphine And Related Nausea And Vomiting and Other (See Comments)    Hallucinations   . Penicillins Nausea And Vomiting and Other (See Comments)    Hallucinations     Patient Measurements: Height: 5\' 8"  (172.7 cm) Weight: 163 lb 9.3 oz (74.2 kg) IBW/kg (Calculated) : 68.4 Adjusted Body Weight:     Vital Signs: Temp: 98.3 F (36.8 C) (05/20 1233) Temp src: Oral (05/20 1233) BP: 192/83 mmHg (05/20 1233) Pulse Rate: 88 (05/20 1233) Intake/Output from previous day: 05/19 0701 - 05/20 0700 In: 1131.8 [I.V.:1131.8] Out: -  Intake/Output from this shift: Total I/O In: 3 [I.V.:3] Out: -   Labs:  Recent Labs  04/21/14 0514 04/22/14 0635 04/23/14 0550 04/23/14 1155  WBC 9.4 7.4  --  12.0*  HGB 10.0* 9.7*  --  10.2*  PLT 161 148*  --  141*  CREATININE 5.65* 5.91* 5.90*  --    Estimated Creatinine Clearance: 11.3 ml/min (by C-G formula based on Cr of 5.9). No results found for this basename: VANCOTROUGH, VANCOPEAK, VANCORANDOM, GENTTROUGH, GENTPEAK, GENTRANDOM, TOBRATROUGH, TOBRAPEAK, TOBRARND, AMIKACINPEAK, AMIKACINTROU, AMIKACIN,  in the last 72 hours   Microbiology: No results found for this or any previous visit (from the past 720 hour(s)).  Medical History: Past Medical History  Diagnosis Date  . Hypertension   . 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 Diverticulitis   . Chronic kidney disease     RENAL INSUFFICIENCY  . Skin cancer of face   . Muscle spasm of back 04/21/2014  . History of lung transplant 04/21/2014    Medications:  Prescriptions prior to admission  Medication Sig Dispense Refill  . aspirin EC 81 MG tablet Take 81 mg by mouth daily.      . calcium acetate (PHOSLO) 667 MG capsule Take 667 mg by mouth 2 (two) times daily with a meal.      . carvedilol (COREG) 25 MG tablet Take 25 mg by mouth 2 (two) times  daily with a meal.      . doxazosin (CARDURA) 4 MG tablet Take 4 mg by mouth daily.      . ferrous gluconate (FERGON) 225 (27 FE) MG tablet Take 240 mg by mouth 3 (three) times daily with meals.      Marland Kitchen glipiZIDE (GLUCOTROL) 5 MG tablet Take 5 mg by mouth daily before breakfast.      . Multiple Vitamin (MULTIVITAMIN WITH MINERALS) TABS tablet Take 1 tablet by mouth daily.      . pravastatin (PRAVACHOL) 40 MG tablet Take 20 mg by mouth daily.      . predniSONE (DELTASONE) 5 MG tablet Take 5 mg by mouth 2 (two) times daily with a meal.      . ranitidine (ZANTAC) 150 MG tablet Take 150 mg by mouth 2 (two) times daily.      . sodium bicarbonate 650 MG tablet Take 1,300 mg by mouth 3 (three) times daily.      Marland Kitchen sulfamethoxazole-trimethoprim (BACTRIM,SEPTRA) 400-80 MG per tablet Take 1 tablet by mouth 3 (three) times a week.      . tacrolimus (PROGRAF) 0.5 MG capsule Take 0.5 mg by mouth 2 (two) times daily.       Assessment:Herpes Zoster eruption at approximate left T-12 dermatome.   73 y/o M presented 5/17 c/o abdominal and back pain. Patient has CKD and B  lung transplants on immunosuppression. Patient is on abx for possibly diverticulitis/abscess but no surgical intervention planned.   Afebrile. WBC 12 (up), Scr 5.9 (not on HD),   Plan:  Started Acyclovir 5mg /kg IV x 1 then Acyclovir 800mg  po q12hrs    Jesenya Bowditch S. Alford Highland, PharmD, [facility] Clinical Staff Pharmacist Pager [phone]  Coralyn Pear Alford Highland 04/23/2014,3:12 PM

## 2014-04-24 DIAGNOSIS — R4182 Altered mental status, unspecified: Secondary | ICD-10-CM

## 2014-04-24 DIAGNOSIS — B029 Zoster without complications: Secondary | ICD-10-CM

## 2014-04-24 DIAGNOSIS — R1032 Left lower quadrant pain: Secondary | ICD-10-CM

## 2014-04-24 LAB — CBC WITH DIFFERENTIAL/PLATELET
BASOS PCT: 0 % (ref 0–1)
Basophils Absolute: 0 10*3/uL (ref 0.0–0.1)
EOS ABS: 0 10*3/uL (ref 0.0–0.7)
EOS PCT: 0 % (ref 0–5)
HCT: 30.6 % — ABNORMAL LOW (ref 39.0–52.0)
Hemoglobin: 10 g/dL — ABNORMAL LOW (ref 13.0–17.0)
Lymphocytes Relative: 8 % — ABNORMAL LOW (ref 12–46)
Lymphs Abs: 0.8 10*3/uL (ref 0.7–4.0)
MCH: 32.5 pg (ref 26.0–34.0)
MCHC: 32.7 g/dL (ref 30.0–36.0)
MCV: 99.4 fL (ref 78.0–100.0)
MONOS PCT: 8 % (ref 3–12)
Monocytes Absolute: 0.8 10*3/uL (ref 0.1–1.0)
Neutro Abs: 8 10*3/uL — ABNORMAL HIGH (ref 1.7–7.7)
Neutrophils Relative %: 84 % — ABNORMAL HIGH (ref 43–77)
PLATELETS: 138 10*3/uL — AB (ref 150–400)
RBC: 3.08 MIL/uL — AB (ref 4.22–5.81)
RDW: 14.2 % (ref 11.5–15.5)
WBC: 9.6 10*3/uL (ref 4.0–10.5)

## 2014-04-24 LAB — RENAL FUNCTION PANEL
ALBUMIN: 3.6 g/dL (ref 3.5–5.2)
BUN: 77 mg/dL — ABNORMAL HIGH (ref 6–23)
CO2: 27 meq/L (ref 19–32)
CREATININE: 5.94 mg/dL — AB (ref 0.50–1.35)
Calcium: 10.7 mg/dL — ABNORMAL HIGH (ref 8.4–10.5)
Chloride: 96 mEq/L (ref 96–112)
GFR, EST AFRICAN AMERICAN: 10 mL/min — AB (ref >90)
GFR, EST NON AFRICAN AMERICAN: 9 mL/min — AB (ref >90)
Glucose, Bld: 268 mg/dL — ABNORMAL HIGH (ref 70–99)
Phosphorus: 6.5 mg/dL — ABNORMAL HIGH (ref 2.3–4.6)
Potassium: 5.4 mEq/L — ABNORMAL HIGH (ref 3.7–5.3)
Sodium: 145 mEq/L (ref 137–147)

## 2014-04-24 LAB — GLUCOSE, CAPILLARY
GLUCOSE-CAPILLARY: 304 mg/dL — AB (ref 70–99)
Glucose-Capillary: 266 mg/dL — ABNORMAL HIGH (ref 70–99)
Glucose-Capillary: 261 mg/dL — ABNORMAL HIGH (ref 70–99)
Glucose-Capillary: 204 mg/dL — ABNORMAL HIGH (ref 70–99)

## 2014-04-24 MED ORDER — ACYCLOVIR 200 MG PO CAPS
800.0000 mg | ORAL_CAPSULE | Freq: Every day | ORAL | Status: DC
  Administered 2014-04-25 – 2014-04-28 (×4): 800 mg via ORAL
  Filled 2014-04-24 (×4): qty 4

## 2014-04-24 MED ORDER — OXYCODONE HCL 5 MG PO TABS
5.0000 mg | ORAL_TABLET | Freq: Four times a day (QID) | ORAL | Status: DC | PRN
  Administered 2014-04-24 – 2014-04-25 (×3): 5 mg via ORAL
  Filled 2014-04-24 (×3): qty 1

## 2014-04-24 MED ORDER — LANTHANUM CARBONATE 500 MG PO CHEW
500.0000 mg | CHEWABLE_TABLET | Freq: Three times a day (TID) | ORAL | Status: DC
  Administered 2014-04-24 – 2014-04-27 (×9): 500 mg via ORAL
  Filled 2014-04-24 (×12): qty 1

## 2014-04-24 NOTE — Progress Notes (Addendum)
Subjective:  Rash in T12 dermatome. Mild back pain. Patient is tolerating PO w/o problems. Mild abdominal discomfort. Tolerating food without problems.   Objective: Vital signs in last 24 hours: Filed Vitals:   04/23/14 1233 04/23/14 2112 04/24/14 0500 04/24/14 0503  BP: 192/83 165/68  164/77  Pulse: 88 90  95  Temp: 98.3 F (36.8 C) 97.7 F (36.5 C)  98.4 F (36.9 C)  TempSrc: Oral Oral    Resp: 17 16  17   Height:      Weight:   160 lb 15 oz (73 kg)   SpO2: 100% 81%  90%   Weight change: -2 lb 1 oz (-0.936 kg)  Intake/Output Summary (Last 24 hours) at 04/24/14 0754 Last data filed at 04/23/14 2205  Gross per 24 hour  Intake      3 ml  Output    350 ml  Net   -347 ml   Physical Exam  Constitutional: He is oriented to person, place, and time.  Abdominal: Soft. Bowel sounds are normal. He exhibits no distension. There is no tenderness. There is no rebound and no guarding.  Musculoskeletal: He exhibits tenderness.  The patients flank pain is stable at this time.  Neurological: He is alert and oriented to person, place, and time.  Skin: Rash noted.  Vesicular rash in T12 (left side)  Psychiatric: He has a normal mood and affect. His behavior is normal.    Lab Results: Basic Metabolic Panel:  Recent Labs Lab 04/20/14 0825  04/22/14 0635 04/23/14 0550  NA 141  < > 139 143  K 5.0  < > 5.5* 5.1  CL 99  < > 94* 96  CO2 25  < > 26 28  GLUCOSE 154*  < > 237* 211*  BUN 61*  < > 73* 72*  CREATININE 5.58*  < > 5.91* 5.90*  CALCIUM 10.5  < > 9.6 10.0  MG 2.1  --   --   --   PHOS 4.9*  --  6.8* 6.6*  < > = values in this interval not displayed. Liver Function Tests:  Recent Labs Lab 04/20/14 0825 04/21/14 0514 04/22/14 0635 04/23/14 0550  AST 17 16  --   --   ALT 10 9  --   --   ALKPHOS 61 54  --   --   BILITOT 0.5 0.6  --   --   PROT 7.6 7.0  --   --   ALBUMIN 3.7 3.3* 3.3* 3.4*   CBC:  Recent Labs Lab 04/20/14 0825  04/22/14 0635 04/23/14 1155   WBC 10.0  < > 7.4 12.0*  NEUTROABS 7.7  --  5.9  --   HGB 11.0*  < > 9.7* 10.2*  HCT 32.4*  < > 28.8* 30.3*  MCV 97.3  < > 97.0 97.4  PLT 156  < > 148* 141*  < > = values in this interval not displayed. CBG:  Recent Labs Lab 04/22/14 1741 04/22/14 2056 04/23/14 0728 04/23/14 1210 04/23/14 1726 04/23/14 2207  GLUCAP 228* 137* 198* 183* 216* 187*   Urinalysis:  Recent Labs Lab 04/20/14 1059  COLORURINE YELLOW  LABSPEC 1.015  PHURINE 7.0  GLUCOSEU 100*  HGBUR TRACE*  BILIRUBINUR NEGATIVE  KETONESUR NEGATIVE  PROTEINUR 100*  UROBILINOGEN 0.2  NITRITE NEGATIVE  LEUKOCYTESUR NEGATIVE   Studies/Results: No results found. Medications: I have reviewed the patient's current medications. Scheduled Meds: . acyclovir  800 mg Oral Q12H  . amLODipine  10 mg  Oral Daily  . aspirin EC  81 mg Oral Daily  . calcium acetate  667 mg Oral BID WC  . carvedilol  25 mg Oral BID WC  . ciprofloxacin  500 mg Oral q1800  . doxazosin  4 mg Oral Daily  . famotidine  20 mg Oral Daily  . insulin aspart  0-9 Units Subcutaneous TID WC  . metroNIDAZOLE  500 mg Oral 3 times per day  . predniSONE  5 mg Oral BID WC  . sodium bicarbonate  1,300 mg Oral TID  . sodium chloride  3 mL Intravenous Q12H  . tacrolimus  0.5 mg Oral BID   Continuous Infusions: . sodium chloride 10 mL/hr at 04/23/14 1343   PRN Meds:.acetaminophen, acetaminophen, ondansetron (ZOFRAN) IV, ondansetron Assessment/Plan: Principal Problem:   Diverticulitis Active Problems:   DIABETES MELLITUS   Complications of transplanted lung   CKD (chronic kidney disease) stage 5, GFR less than 15 ml/min   Muscle spasm of back   History of lung transplant   Herpes zoster  AMS The patient has had intermitted confusion while hospitalized. This was likely multifactorial. Contributing factors include IV ativan, IV narcotics, acute illness (diverticulitis and shingles), acute pain, and uremia. Plan to minimize narcotics, avoid BZDs  and control pain with home regimen of oxycodone IR. Renal has been consulted and will provide rec's regarding the patients possible need for HD.  Shingles- The patients back pain appears most likely to be secondary to shingles in T12 dermatome. The patient is immunosuppressed. Further he has CKD 5. Plan for acyclovir per pharmacy.  Of note the patient takes 5 mg oxycodone IR as needed for chronic pain. Typically takes 2 tablets daily.  -acyclovir per pharmacy - oxycodone 5 mg IR q 6 hr prn (hold for AMS)  Mild diverticulitis - resolving The patient has a benign abdominal exam. However, a diminished response due to immunosuppression was possible. There was a question of a pericolonic abscess on admission CT. The patient was made NPO and treated with Flagyl Cipro and then Zosyn. General surgery rec to treat conservatively with NPO and IV abx on admission. On review of the CT abdomen there was very little evidence of inflammation. Thus, there may be very mild diverticulitis. Further, the possible abscess appeared to be more likely a large diverticula without complicationt. I spoke with Dr. Pearline Cables who is the attending on call for the [facility] lung transplant team. Given the patients benign physical exam and back pain consistent with MSK pain, Dr. Pearline Cables did not recommend transfer at this time. Should the patients condition change she welcomed further consultation. Patients diet was advanced and he is tolerating with no problems. The abdominal exam remained benign through the entirety of the admission. - cipro flagyl IV in the ED and now zosyn, d/c oral abx will start PO abx per surgery for 1 week. Patient has penicillin allergy, thus plan to start PO cipro and flagyl. - ABX per pharm given ESRD  - consult general surgery for rec's  - clears and advance as tolerated per surgery - d/c IV narcotics  End stage CKD (GFR 9), G5  Patients Cr 5.6 on admission, trended up 5.9. Baseline 4-4.8 (per care everywhere).  Patient has fistula that has never been used. Primary nephrologist is Dr. Marval Regal. Likely represents acute on chronic renal failure due to decreased PO intake and volume depletion.  BUN has been rising as well and likely is contributing to patients intermittent AMS. - Consult Renal, appreciate rec's  Bronchiectasis s/p double lung transplant  The patient is compliant with immunosuppressive regimen. He reports that everything has been going well. No evidence of chest complaints at this time. Plan to discuss case with Lung transplant team and [facility]. - continue home meds  - tacrolimus 0.5 mg BID. Of note the patient came to the hospital reporting that he was taking 1 mg qd. Our pharmacy colleagues spoke with the patients home pharmacist who confrimed that he should be taking  0.5 mg BID. - prednisone 5 mg daily   DM  Stable. Hold home meds.  -SSI   HTN  Moderately htn on admission. Likely due to acute pain/infection.  - continue home coreg  - on home doxazosin - started amlodipine 10 mg qd  GERD  - continue H2 blocker    Dispo: Anticipate d/c alter today pending renal rec's.  The patient does have a current PCP (Tyler Mendel Corning, MD) and does not need an [facility] hospital follow-up appointment after discharge.  The patient does not have transportation limitations that hinder transportation to clinic appointments.  .Services Needed at time of discharge: Y = Yes, Blank = No PT:   OT:   RN:   Equipment:   Other:     LOS: 4 days   Tyler Coy, MD 04/24/2014, 7:54 AM Attending physician note: We appreciate input from nephrology and Gen. surgery. Status and management plan I could as recorded above by a resident physician Dr. Marrion Mueller. He was started on parenteral acyclovir on May 20. Given his overall immunosuppressed condition on chronic steroids and Prograf, we will decide whether or not it is in his best interest to continue a seven-day course of parenteral  acyclovir or whether with it is safe to put him on a oral dose. Tyler Mueller, M.D., [place]

## 2014-04-24 NOTE — Progress Notes (Signed)
No apparent intra-abdominal process. Shingles being managed by primary team.  Tyler Mueller. Tyler Dover, MD, [facility] Surgery  General/ Trauma Surgery  04/24/2014 3:24 PM

## 2014-04-24 NOTE — Progress Notes (Signed)
Physical Therapy Treatment Patient Details Name: Tyler Mueller MRN: [id number] DOB: [birth year] Today's Date: 04/24/2014    History of Present Illness OWIN HURNEY is a 73 y.o. man with a pmhx of double lung transplant (11 years), diverticulitis (3 years ago), CKD V (not on HD) who presents to the hospital on 5/17 w/ a cc of back pain. The patient was in his normal state of health until three days ago when he developed flank pain after playing a round of golf. He has since developed worsening back and flank pain. He has associated symptoms of nausea and constipation. Pt given ativan 5/18 and has been confused since.    PT Comments    Initially pt did not want to participate, eventually did, ambulated in room which is improved and participated in gross balance test which indicates a fall risk. Recommend HHPT for balance and safety. Pt is somewhat aggressive when talking to wife and PT, unsure if close to baseline or related to confusion since in hospital.  Follow Up Recommendations  Home health PT (balance and safety eval)     Equipment Recommendations  None recommended by PT    Recommendations for Other Services       Precautions / Restrictions Precautions Precautions: Fall    Mobility  Bed Mobility   Bed Mobility: Supine to Sit     Supine to sit: Supervision     General bed mobility comments: pt reports getting up ad lib, bed alarm not on.  Transfers   Equipment used: None Transfers: Sit to/from Stand Sit to Stand: Supervision         General transfer comment: impulsive, moves quickly.  Ambulation/Gait Ambulation/Gait assistance: Supervision;Min guard Ambulation Distance (Feet): 80 Feet Assistive device: None Gait Pattern/deviations: Step-through pattern         Stairs            Wheelchair Mobility    Modified Rankin (Stroke Patients Only)       Balance Overall balance assessment: Needs assistance         Standing balance support:  No upper extremity supported;During functional activity Standing balance-Leahy Scale: Fair                      Cognition Arousal/Alertness: Awake/alert Behavior During Therapy: Impulsive;Agitated (pt states: I just like to mess with you") Overall Cognitive Status: Impaired/Different from baseline Area of Impairment: Safety/judgement   Current Attention Level: Focused   Following Commands: Follows one step commands consistently       General Comments: pt speaking curtly initially, expressed disinterest in PT working with him, pt did participate and appeared more jovial at end of session. unintere    Exercises      General Comments General comments (skin integrity, edema, etc.): Pt has a significant risk for fall with higher level activities, ptartly may be due to pt's imppulsivity      Pertinent Vitals/Pain No c/o    Home Living                      Prior Function            PT Goals (current goals can now be found in the care plan section) Progress towards PT goals: Progressing toward goals    Frequency  Min 3X/week    PT Plan Current plan remains appropriate;Discharge plan needs to be updated    Co-evaluation  End of Session Equipment Utilized During Treatment:  (pt refused) Activity Tolerance: Patient tolerated treatment well Patient left: in bed;with call bell/phone within reach;with family/visitor present     Time: [id number] PT Time Calculation (min): 15 min  Charges:  $Gait Training: 8-22 mins                    G Codes:      Claretha Cooper 04/24/2014, 3:53 PM

## 2014-04-24 NOTE — Progress Notes (Signed)
Results for TOMER, ROSSEN (MRN [id number]) as of 04/24/2014 12:46  Ref. Range 04/23/2014 07:28 04/23/2014 12:10 04/23/2014 17:26 04/23/2014 22:07 04/24/2014 07:23 04/24/2014 11:50  Glucose-Capillary Latest Range: 70-99 mg/dL 198 (H) 183 (H) 216 (H) 187 (H) 266 (H) 261 (H)   CBGs continue to be greater than 180 mg/dl.  Recommend increasing Novolog correction scale to MODERATE TID & HS.  Will continue to follow while in hospital.  Harvel Ricks RN BSN CDE

## 2014-04-24 NOTE — Progress Notes (Signed)
Patient ID: Tyler Mueller, male   DOB: 10-02-1941, 73 y.o.   MRN: [id number]    Subjective: Pt feels ok. C/o LLQ pain but has rash c/w shingles on LLQ of abdomen  Objective: Vital signs in last 24 hours: Temp:  [97.7 F (36.5 C)-98.4 F (36.9 C)] 98.4 F (36.9 C) (05/21 0503) Pulse Rate:  [88-95] 95 (05/21 0503) Resp:  [16-17] 17 (05/21 0503) BP: (164-192)/(68-83) 164/77 mmHg (05/21 0503) SpO2:  [81 %-100 %] 90 % (05/21 0503) Weight:  [160 lb 15 oz (73 kg)] 160 lb 15 oz (73 kg) (05/21 0500) Last BM Date: 04/22/14  Intake/Output from previous day: 05/20 0701 - 05/21 0700 In: 3 [I.V.:3] Out: 350 [Urine:350] Intake/Output this shift:    PE: Abd: soft, tender where he has shingles, +BS  Lab Results:   Recent Labs  04/23/14 1155 04/24/14 0830  WBC 12.0* 9.6  HGB 10.2* 10.0*  HCT 30.3* 30.6*  PLT 141* 138*   BMET  Recent Labs  04/23/14 0550 04/24/14 0528  NA 143 145  K 5.1 5.4*  CL 96 96  CO2 28 27  GLUCOSE 211* 268*  BUN 72* 77*  CREATININE 5.90* 5.94*  CALCIUM 10.0 10.7*   PT/INR No results found for this basename: LABPROT, INR,  in the last 72 hours CMP     Component Value Date/Time   NA 145 04/24/2014 0528   K 5.4* 04/24/2014 0528   CL 96 04/24/2014 0528   CO2 27 04/24/2014 0528   GLUCOSE 268* 04/24/2014 0528   GLUCOSE 104* 10/31/2006 0755   BUN 77* 04/24/2014 0528   CREATININE 5.94* 04/24/2014 0528   CALCIUM 10.7* 04/24/2014 0528   CALCIUM 8.5 05/09/2011 1339   PROT 7.0 04/21/2014 0514   ALBUMIN 3.6 04/24/2014 0528   AST 16 04/21/2014 0514   ALT 9 04/21/2014 0514   ALKPHOS 54 04/21/2014 0514   BILITOT 0.6 04/21/2014 0514   GFRNONAA 9* 04/24/2014 0528   GFRAA 10* 04/24/2014 0528   Lipase  No results found for this basename: lipase       Studies/Results: No results found.  Anti-infectives: Anti-infectives   Start     Dose/Rate Route Frequency Ordered Stop   04/24/14 0600  acyclovir (ZOVIRAX) 200 MG capsule 800 mg     800 mg Oral Every 12 hours  04/23/14 1720     04/23/14 1515  acyclovir (ZOVIRAX) 370 mg in dextrose 5 % 100 mL IVPB     5 mg/kg  74.2 kg 107.4 mL/hr over 60 Minutes Intravenous NOW 04/23/14 1512 04/23/14 1728   04/22/14 1800  ciprofloxacin (CIPRO) tablet 500 mg     500 mg Oral Daily-1800 04/22/14 1008     04/22/14 1400  metroNIDAZOLE (FLAGYL) tablet 500 mg     500 mg Oral 3 times per day 04/22/14 1008     04/21/14 1200  ciprofloxacin (CIPRO) IVPB 400 mg  Status:  Discontinued     400 mg 200 mL/hr over 60 Minutes Intravenous Every 24 hours 04/20/14 1045 04/20/14 1246   04/20/14 1400  piperacillin-tazobactam (ZOSYN) IVPB 2.25 g  Status:  Discontinued     2.25 g 100 mL/hr over 30 Minutes Intravenous 3 times per day 04/20/14 1258 04/22/14 1005   04/20/14 1045  ciprofloxacin (CIPRO) IVPB 400 mg  Status:  Discontinued     400 mg 200 mL/hr over 60 Minutes Intravenous NOW 04/20/14 1038 04/20/14 1246   04/20/14 1030  metroNIDAZOLE (FLAGYL) IVPB 500 mg  Status:  Discontinued  500 mg 100 mL/hr over 60 Minutes Intravenous Every 8 hours 04/20/14 1019 04/20/14 1246       Assessment/Plan  1. Abdominal pain from what appears to be shingles  Plan: 1. Treatment per primary team. 2. No surgical plans 3. May dc home when medically stable, we will sign off   LOS: 4 days    Henreitta Cea 04/24/2014, 9:33 AM Pager: [phone]

## 2014-04-24 NOTE — Progress Notes (Signed)
Subjective:  Rash noted in t12 dermatome- now on acyclovir- felt to be possibly etiology of all of this. Is soft spoken but seems better today. - UOP not all recorded creatinine stable but poor- pt saying he doesn't feel like eating-   Objective Vital signs in last 24 hours: Filed Vitals:   04/23/14 1233 04/23/14 2112 04/24/14 0500 04/24/14 0503  BP: 192/83 165/68  164/77  Pulse: 88 90  95  Temp: 98.3 F (36.8 C) 97.7 F (36.5 C)  98.4 F (36.9 C)  TempSrc: Oral Oral    Resp: 17 16  17   Height:      Weight:   73 kg (160 lb 15 oz)   SpO2: 100% 81%  90%   Weight change: -0.936 kg (-2 lb 1 oz)  Intake/Output Summary (Last 24 hours) at 04/24/14 1124 Last data filed at 04/24/14 1055  Gross per 24 hour  Intake    480 ml  Output    350 ml  Net    130 ml    Assessment/ Plan: Pt is a 73 y.o. yo male who was admitted on 04/20/2014 with abdominal/back pain - possible intraabdominal abscess- also with advanced CKD at baseline Assessment/Plan: 1. Renal- according to wife- baseline CKD with creatinine as high as 5.  Probably secondary to prograf toxicity from lung transplants.  He has an AVF in place- no indications for dialysis right now but we will continue to follow.   Might be difficult to tell if he is uremic in this situation.  I did say to wife that if he does not really bounce back from this illness that could be our indication to begin dialysis- continue to assess on a daily basis 2. Abdominal /back pain/zoster- possible diverticulitis with abscess- now possibly c/w zoster- on acyclovir- adjusted for renal function. - previously  needing much pain meds but seems to be trending better 3. Anemia- not too significant at this time- no procrit as OP 4. Secondary hyperparathyroidism- on home phoslo but calcium now high, will change to fosrenol 5. HTN/volume- have stopped IVF- is on home meds plus amlodipine 6. Hyperkalemia-  - stable and slightly up- will follow 7. Dispo- looking to  transition him home but is confused and not eating the best- hoping this is due to pain/anxiety meds he got during this hosp but cannot rule out uremia- i really dont know his baseline but his wife seems to think this is different from how he usually is will see what he looks like tomorrow.  Again if continues to fail to thrive may be our indication to start HD  [place]: Basic Metabolic Panel:  Recent Labs Lab 04/22/14 0635 04/23/14 0550 04/24/14 0528  NA 139 143 145  K 5.5* 5.1 5.4*  CL 94* 96 96  CO2 26 28 27   GLUCOSE 237* 211* 268*  BUN 73* 72* 77*  CREATININE 5.91* 5.90* 5.94*  CALCIUM 9.6 10.0 10.7*  PHOS 6.8* 6.6* 6.5*   Liver Function Tests:  Recent Labs Lab 04/20/14 0825 04/21/14 0514 04/22/14 0635 04/23/14 0550 04/24/14 0528  AST 17 16  --   --   --   ALT 10 9  --   --   --   ALKPHOS 61 54  --   --   --   BILITOT 0.5 0.6  --   --   --   PROT 7.6 7.0  --   --   --   ALBUMIN 3.7  3.3* 3.3* 3.4* 3.6   No results found for this basename: LIPASE, AMYLASE,  in the last 168 hours No results found for this basename: AMMONIA,  in the last 168 hours CBC:  Recent Labs Lab 04/20/14 0825 04/21/14 0514 04/22/14 0635 04/23/14 1155 04/24/14 0830  WBC 10.0 9.4 7.4 12.0* 9.6  NEUTROABS 7.7  --  5.9  --  8.0*  HGB 11.0* 10.0* 9.7* 10.2* 10.0*  HCT 32.4* 31.0* 28.8* 30.3* 30.6*  MCV 97.3 99.4 97.0 97.4 99.4  PLT 156 161 148* 141* 138*   Cardiac Enzymes: No results found for this basename: CKTOTAL, CKMB, CKMBINDEX, TROPONINI,  in the last 168 hours CBG:  Recent Labs Lab 04/23/14 0728 04/23/14 1210 04/23/14 1726 04/23/14 2207 04/24/14 0723  GLUCAP 198* 183* 216* 187* 266*    Iron Studies: No results found for this basename: IRON, TIBC, TRANSFERRIN, FERRITIN,  in the last 72 hours Studies/Results: No results found. Medications: Infusions: . sodium chloride 10 mL/hr at 04/23/14 1343    Scheduled Medications: . acyclovir  800 mg  Oral Q12H  . amLODipine  10 mg Oral Daily  . aspirin EC  81 mg Oral Daily  . calcium acetate  667 mg Oral BID WC  . carvedilol  25 mg Oral BID WC  . ciprofloxacin  500 mg Oral q1800  . doxazosin  4 mg Oral Daily  . famotidine  20 mg Oral Daily  . insulin aspart  0-9 Units Subcutaneous TID WC  . metroNIDAZOLE  500 mg Oral 3 times per day  . predniSONE  5 mg Oral BID WC  . sodium bicarbonate  1,300 mg Oral TID  . sodium chloride  3 mL Intravenous Q12H  . tacrolimus  0.5 mg Oral BID    have reviewed scheduled and prn medications.  Physical Exam: General: awake and alert Heart:  RRR Lungs: mostly clear Abdomen: distended  Extremities: no edema Left sided AVF would be ready to use    04/24/2014,11:24 AM  LOS: 4 days

## 2014-04-25 DIAGNOSIS — A0472 Enterocolitis due to Clostridium difficile, not specified as recurrent: Secondary | ICD-10-CM

## 2014-04-25 DIAGNOSIS — N185 Chronic kidney disease, stage 5: Secondary | ICD-10-CM

## 2014-04-25 LAB — CBC WITH DIFFERENTIAL/PLATELET
BASOS PCT: 0 % (ref 0–1)
Basophils Absolute: 0 10*3/uL (ref 0.0–0.1)
Eosinophils Absolute: 0 10*3/uL (ref 0.0–0.7)
Eosinophils Relative: 0 % (ref 0–5)
HCT: 30.8 % — ABNORMAL LOW (ref 39.0–52.0)
Hemoglobin: 9.9 g/dL — ABNORMAL LOW (ref 13.0–17.0)
LYMPHS PCT: 12 % (ref 12–46)
Lymphs Abs: 1 10*3/uL (ref 0.7–4.0)
MCH: 32.1 pg (ref 26.0–34.0)
MCHC: 32.1 g/dL (ref 30.0–36.0)
MCV: 100 fL (ref 78.0–100.0)
Monocytes Absolute: 0.9 10*3/uL (ref 0.1–1.0)
Monocytes Relative: 11 % (ref 3–12)
NEUTROS ABS: 6.3 10*3/uL (ref 1.7–7.7)
NEUTROS PCT: 77 % (ref 43–77)
Platelets: 142 10*3/uL — ABNORMAL LOW (ref 150–400)
RBC: 3.08 MIL/uL — ABNORMAL LOW (ref 4.22–5.81)
RDW: 14.2 % (ref 11.5–15.5)
WBC: 8.2 10*3/uL (ref 4.0–10.5)

## 2014-04-25 LAB — RENAL FUNCTION PANEL
ALBUMIN: 3.2 g/dL — AB (ref 3.5–5.2)
BUN: 89 mg/dL — ABNORMAL HIGH (ref 6–23)
CO2: 30 mEq/L (ref 19–32)
Calcium: 9.9 mg/dL (ref 8.4–10.5)
Chloride: 97 mEq/L (ref 96–112)
Creatinine, Ser: 6.38 mg/dL — ABNORMAL HIGH (ref 0.50–1.35)
GFR, EST AFRICAN AMERICAN: 9 mL/min — AB (ref >90)
GFR, EST NON AFRICAN AMERICAN: 8 mL/min — AB (ref >90)
Glucose, Bld: 268 mg/dL — ABNORMAL HIGH (ref 70–99)
POTASSIUM: 5.1 meq/L (ref 3.7–5.3)
Phosphorus: 6 mg/dL — ABNORMAL HIGH (ref 2.3–4.6)
SODIUM: 143 meq/L (ref 137–147)

## 2014-04-25 LAB — GLUCOSE, CAPILLARY
GLUCOSE-CAPILLARY: 201 mg/dL — AB (ref 70–99)
Glucose-Capillary: 240 mg/dL — ABNORMAL HIGH (ref 70–99)
Glucose-Capillary: 266 mg/dL — ABNORMAL HIGH (ref 70–99)
Glucose-Capillary: 265 mg/dL — ABNORMAL HIGH (ref 70–99)

## 2014-04-25 NOTE — Progress Notes (Signed)
ANTI-INFECTIVE CONSULT NOTE - FOLLOW UP  Pharmacy Consult for Acyclovir Indication: Shingles at the L-T-12 dermatome  Allergies  Allergen Reactions  . Morphine And Related Nausea And Vomiting and Other (See Comments)    Hallucinations   . Penicillins Nausea And Vomiting and Other (See Comments)    Hallucinations     Patient Measurements: Height: 5\' 8"  (172.7 cm) Weight: 161 lb 2.5 oz (73.1 kg) IBW/kg (Calculated) : 68.4  Vital Signs: Temp: 98.6 F (37 C) (05/22 0552) Temp src: Oral (05/22 0552) BP: 154/68 mmHg (05/22 0552) Pulse Rate: 93 (05/22 0552) Intake/Output from previous day: 05/21 0701 - 05/22 0700 In: 840 [P.O.:720; I.V.:120] Out: 850 [Urine:850] Intake/Output from this shift:    Labs:  Recent Labs  04/23/14 0550 04/23/14 1155 04/24/14 0528 04/24/14 0830 04/25/14 0544  WBC  --  12.0*  --  9.6 8.2  HGB  --  10.2*  --  10.0* 9.9*  PLT  --  141*  --  138* 142*  CREATININE 5.90*  --  5.94*  --  6.38*   Estimated Creatinine Clearance: 10.4 ml/min (by C-G formula based on Cr of 6.38). No results found for this basename: VANCOTROUGH, VANCOPEAK, VANCORANDOM, GENTTROUGH, GENTPEAK, GENTRANDOM, TOBRATROUGH, TOBRAPEAK, TOBRARND, AMIKACINPEAK, AMIKACINTROU, AMIKACIN,  in the last 72 hours   Microbiology: No results found for this or any previous visit (from the past 720 hour(s)).  Anti-infectives   Start     Dose/Rate Route Frequency Ordered Stop   04/25/14 1000  acyclovir (ZOVIRAX) 200 MG capsule 800 mg     800 mg Oral Daily 04/24/14 1131     04/24/14 0600  acyclovir (ZOVIRAX) 200 MG capsule 800 mg  Status:  Discontinued     800 mg Oral Every 12 hours 04/23/14 1720 04/24/14 1131   04/23/14 1515  acyclovir (ZOVIRAX) 370 mg in dextrose 5 % 100 mL IVPB     5 mg/kg  74.2 kg 107.4 mL/hr over 60 Minutes Intravenous NOW 04/23/14 1512 04/23/14 1728   04/22/14 1800  ciprofloxacin (CIPRO) tablet 500 mg  Status:  Discontinued     500 mg Oral Daily-1800 04/22/14 1008  04/24/14 1433   04/22/14 1400  metroNIDAZOLE (FLAGYL) tablet 500 mg  Status:  Discontinued     500 mg Oral 3 times per day 04/22/14 1008 04/24/14 1433   04/21/14 1200  ciprofloxacin (CIPRO) IVPB 400 mg  Status:  Discontinued     400 mg 200 mL/hr over 60 Minutes Intravenous Every 24 hours 04/20/14 1045 04/20/14 1246   04/20/14 1400  piperacillin-tazobactam (ZOSYN) IVPB 2.25 g  Status:  Discontinued     2.25 g 100 mL/hr over 30 Minutes Intravenous 3 times per day 04/20/14 1258 04/22/14 1005   04/20/14 1045  ciprofloxacin (CIPRO) IVPB 400 mg  Status:  Discontinued     400 mg 200 mL/hr over 60 Minutes Intravenous NOW 04/20/14 1038 04/20/14 1246   04/20/14 1030  metroNIDAZOLE (FLAGYL) IVPB 500 mg  Status:  Discontinued     500 mg 100 mL/hr over 60 Minutes Intravenous Every 8 hours 04/20/14 1019 04/20/14 1246      Assessment: 17 YOM who continues on Acyclovir for shingles at the L-T-12 dermatome. Renal adjusted the dose to 800 mg once daily for the patient's renal function - this was discussed with Dr. Moshe Cipro and we will plan to continue at this dose for now. If the renal function improves - we will plan to increase the dose.   Goal of Therapy:  Proper anti-infective  for infection/cultures adjusted for renal/hepatic function   Plan:  1. Continue Acyclovir 800 mg once daily (per renal MD preference) 2. Will continue to follow renal function and need to increase Acyclovir dose if it improves  Alycia Rossetti, PharmD, BCPS Clinical Pharmacist Pager: [phone] 04/25/2014 11:45 AM

## 2014-04-25 NOTE — Progress Notes (Signed)
Pt's wife called and voiced concern about pt's low oral intake and noticed his IVF saline locked during daytime while she was with him; knowing the condition of his kidneys, wife believed he is not getting enough fluid. Pt refused his IVF to be hooked;  stated he is drinking enough fluid and had good urine output. Will continue to monitor.

## 2014-04-25 NOTE — Progress Notes (Signed)
Subjective:  More alert and talkative- wife says still confused  - UOP seems better but  creatinine worse-- pt saying he did eat breakfast, lunch tray sitting not eaten  Objective Vital signs in last 24 hours: Filed Vitals:   04/24/14 0503 04/24/14 1450 04/24/14 2259 04/25/14 0552  BP: 164/77 146/67 147/62 154/68  Pulse: 95 88 95 93  Temp: 98.4 F (36.9 C) 98.3 F (36.8 C) 98.7 F (37.1 C) 98.6 F (37 C)  TempSrc:  Oral Oral Oral  Resp: 17 17 18 16   Height:      Weight:    73.1 kg (161 lb 2.5 oz)  SpO2: 90% 90% 90% 91%   Weight change: 0.1 kg (3.5 oz)  Intake/Output Summary (Last 24 hours) at 04/25/14 1344 Last data filed at 04/25/14 0945  Gross per 24 hour  Intake    840 ml  Output    850 ml  Net    -10 ml    Assessment/ Plan: Pt is a 73 y.o. yo male who was admitted on 04/20/2014 with abdominal/back pain - possible intraabdominal abscess- also with advanced CKD at baseline Assessment/Plan: 1. Renal- according to wife- baseline CKD with creatinine as high as 5.  Probably secondary to prograf toxicity from lung transplants.  He has an AVF in place- no indications for dialysis right now but we will continue to follow.   Might be difficult to tell if he is uremic in this situation.  I did say to wife that if he does not really bounce back from this illness that could be our indication to begin dialysis- continue to assess on a daily basis- and now that creatinine has taken a pretty big step back I have d/w teaching service to keep him here longer 2. Abdominal /back pain/zoster- possible diverticulitis with abscess- now possibly c/w zoster- on acyclovir- adjusted for renal function. - previously  needing much pain meds but seems to be trending better 3. Anemia- not too significant at this time- no procrit as OP 4. Secondary hyperparathyroidism- on home phoslo but calcium now high, will change to fosrenol 5. HTN/volume- have stopped IVF- is on home meds plus amlodipine 6.  Hyperkalemia-  - stable and slightly up- will follow 7. Dispo- looking to transition him home but is still confused and not eating the best- hoping this is due to pain/anxiety meds he got during this hosp but cannot rule out uremia- i really dont know his baseline but his wife seems to think this is different from how he usually is will see what he looks like tomorrow.  Again if continues to fail to thrive may be our indication to start HD  [place]: Basic Metabolic Panel:  Recent Labs Lab 04/23/14 0550 04/24/14 0528 04/25/14 0544  NA 143 145 143  K 5.1 5.4* 5.1  CL 96 96 97  CO2 28 27 30   GLUCOSE 211* 268* 268*  BUN 72* 77* 89*  CREATININE 5.90* 5.94* 6.38*  CALCIUM 10.0 10.7* 9.9  PHOS 6.6* 6.5* 6.0*   Liver Function Tests:  Recent Labs Lab 04/20/14 0825 04/21/14 0514  04/23/14 0550 04/24/14 0528 04/25/14 0544  AST 17 16  --   --   --   --   ALT 10 9  --   --   --   --   ALKPHOS 61 54  --   --   --   --   BILITOT 0.5 0.6  --   --   --   --  PROT 7.6 7.0  --   --   --   --   ALBUMIN 3.7 3.3*  < > 3.4* 3.6 3.2*  < > = values in this interval not displayed. No results found for this basename: LIPASE, AMYLASE,  in the last 168 hours No results found for this basename: AMMONIA,  in the last 168 hours CBC:  Recent Labs Lab 04/21/14 0514 04/22/14 0635 04/23/14 1155 04/24/14 0830 04/25/14 0544  WBC 9.4 7.4 12.0* 9.6 8.2  NEUTROABS  --  5.9  --  8.0* 6.3  HGB 10.0* 9.7* 10.2* 10.0* 9.9*  HCT 31.0* 28.8* 30.3* 30.6* 30.8*  MCV 99.4 97.0 97.4 99.4 100.0  PLT 161 148* 141* 138* 142*   Cardiac Enzymes: No results found for this basename: CKTOTAL, CKMB, CKMBINDEX, TROPONINI,  in the last 168 hours CBG:  Recent Labs Lab 04/24/14 1150 04/24/14 1658 04/24/14 2250 04/25/14 0736 04/25/14 1149  GLUCAP 261* 204* 304* 240* 266*    Iron Studies: No results found for this basename: IRON, TIBC, TRANSFERRIN, FERRITIN,  in the last 72  hours Studies/Results: No results found. Medications: Infusions: . sodium chloride 10 mL/hr at 04/23/14 1343    Scheduled Medications: . acyclovir  800 mg Oral Daily  . amLODipine  10 mg Oral Daily  . aspirin EC  81 mg Oral Daily  . carvedilol  25 mg Oral BID WC  . doxazosin  4 mg Oral Daily  . famotidine  20 mg Oral Daily  . insulin aspart  0-9 Units Subcutaneous TID WC  . lanthanum  500 mg Oral TID WC  . predniSONE  5 mg Oral BID WC  . sodium bicarbonate  1,300 mg Oral TID  . sodium chloride  3 mL Intravenous Q12H  . tacrolimus  0.5 mg Oral BID    have reviewed scheduled and prn medications.  Physical Exam: General: awake and alert Heart:  RRR Lungs: mostly clear Abdomen: distended  Extremities: no edema Left sided AVF would be ready to use    04/25/2014,1:44 PM  LOS: 5 days

## 2014-04-25 NOTE — Progress Notes (Signed)
Results for MOHAN, LANPHEAR (MRN [id number]) as of 04/25/2014 11:50  Ref. Range 04/24/2014 07:23 04/24/2014 11:50 04/24/2014 16:58 04/24/2014 22:50 04/25/2014 07:36  Glucose-Capillary Latest Range: 70-99 mg/dL 266 (H) 261 (H) 204 (H) 304 (H) 240 (H)  CBGs continue to be elevated greater than 180 mg/dl.  Recommend increasing Novolog correction scale to MODERATE TID & HS.  May need low dose of Lantus basal insulin if CBGs continue to be elevated after increasing Novolog correction scale.  Will follow.  Harvel Ricks RN BSN CDE

## 2014-04-25 NOTE — Progress Notes (Addendum)
Subjective:  Patients metnal status is much improved this am. His back pain is well controlled with current regimen. He is feeling much better. Increased PO intake and had uneventful BM.  Objective: Vital signs in last 24 hours: Filed Vitals:   04/24/14 0503 04/24/14 1450 04/24/14 2259 04/25/14 0552  BP: 164/77 146/67 147/62 154/68  Pulse: 95 88 95 93  Temp: 98.4 F (36.9 C) 98.3 F (36.8 C) 98.7 F (37.1 C) 98.6 F (37 C)  TempSrc:  Oral Oral Oral  Resp: 17 17 18 16   Height:      Weight:    161 lb 2.5 oz (73.1 kg)  SpO2: 90% 90% 90% 91%   Weight change: 3.5 oz (0.1 kg)  Intake/Output Summary (Last 24 hours) at 04/25/14 1119 Last data filed at 04/25/14 0554  Gross per 24 hour  Intake    360 ml  Output    850 ml  Net   -490 ml   Physical Exam  Constitutional: He is oriented to person, place, and time.  Abdominal: Soft. Bowel sounds are normal. He exhibits no distension. There is no tenderness. There is no rebound and no guarding.  Musculoskeletal: He exhibits tenderness.  The patients flank pain is stable at this time.  Neurological: He is alert and oriented to person, place, and time.  Skin: Rash noted.  Vesicular rash in T12 (left side)  Psychiatric: He has a normal mood and affect. His behavior is normal.    Lab Results: Basic Metabolic Panel:  Recent Labs Lab 04/20/14 0825  04/24/14 0528 04/25/14 0544  NA 141  < > 145 143  K 5.0  < > 5.4* 5.1  CL 99  < > 96 97  CO2 25  < > 27 30  GLUCOSE 154*  < > 268* 268*  BUN 61*  < > 77* 89*  CREATININE 5.58*  < > 5.94* 6.38*  CALCIUM 10.5  < > 10.7* 9.9  MG 2.1  --   --   --   PHOS 4.9*  < > 6.5* 6.0*  < > = values in this interval not displayed. Liver Function Tests:  Recent Labs Lab 04/20/14 0825 04/21/14 0514  04/24/14 0528 04/25/14 0544  AST 17 16  --   --   --   ALT 10 9  --   --   --   ALKPHOS 61 54  --   --   --   BILITOT 0.5 0.6  --   --   --   PROT 7.6 7.0  --   --   --   ALBUMIN 3.7 3.3*   < > 3.6 3.2*  < > = values in this interval not displayed. CBC:  Recent Labs Lab 04/24/14 0830 04/25/14 0544  WBC 9.6 8.2  NEUTROABS 8.0* 6.3  HGB 10.0* 9.9*  HCT 30.6* 30.8*  MCV 99.4 100.0  PLT 138* 142*   CBG:  Recent Labs Lab 04/23/14 2207 04/24/14 0723 04/24/14 1150 04/24/14 1658 04/24/14 2250 04/25/14 0736  GLUCAP 187* 266* 261* 204* 304* 240*   Urinalysis:  Recent Labs Lab 04/20/14 1059  COLORURINE YELLOW  LABSPEC 1.015  PHURINE 7.0  GLUCOSEU 100*  HGBUR TRACE*  BILIRUBINUR NEGATIVE  KETONESUR NEGATIVE  PROTEINUR 100*  UROBILINOGEN 0.2  NITRITE NEGATIVE  LEUKOCYTESUR NEGATIVE   Studies/Results: No results found. Medications: I have reviewed the patient's current medications. Scheduled Meds: . acyclovir  800 mg Oral Daily  . amLODipine  10 mg Oral Daily  .  aspirin EC  81 mg Oral Daily  . carvedilol  25 mg Oral BID WC  . doxazosin  4 mg Oral Daily  . famotidine  20 mg Oral Daily  . insulin aspart  0-9 Units Subcutaneous TID WC  . lanthanum  500 mg Oral TID WC  . predniSONE  5 mg Oral BID WC  . sodium bicarbonate  1,300 mg Oral TID  . sodium chloride  3 mL Intravenous Q12H  . tacrolimus  0.5 mg Oral BID   Continuous Infusions: . sodium chloride 10 mL/hr at 04/23/14 1343   PRN Meds:.acetaminophen, acetaminophen, ondansetron (ZOFRAN) IV, ondansetron, oxyCODONE Assessment/Plan: Principal Problem:   Herpes zoster Active Problems:   DIABETES MELLITUS   Complications of transplanted lung   Diverticulitis   CKD (chronic kidney disease) stage 5, GFR less than 15 ml/min   Muscle spasm of back   History of lung transplant  AMS The patient has had intermitted confusion while hospitalized. This was likely multifactorial. Contributing factors include IV ativan, IV narcotics, acute illness (diverticulitis and shingles), acute pain, and uremia. Plan to minimize narcotics, avoid BZDs and control pain with home regimen of oxycodone IR. Renal has been  consulted and will provide rec's regarding the patients possible need for HD. While the patients uremia has increased on 5/22, his mental status is actually much improved.   Shingles- The patients back pain appears most likely to be secondary to shingles in T12 dermatome. The patient is immunosuppressed. Further he has CKD 5. Plan for acyclovir per pharmacy.  Of note the patient takes 5 mg oxycodone IR as needed for chronic pain. Typically takes 2 tablets daily.  -acyclovir per pharmacy (now on 800 mg BID PO) - oxycodone 5 mg IR q 6 hr prn (hold for AMS)  Mild diverticulitis - resolved The patient has a benign abdominal exam. He is tolerating PO w/o problems and is having normal BMs.  End stage CKD (GFR 9), G5  Patients Cr 5.6 on admission, trended up 5.9. Baseline 4-4.8 (per care everywhere). Patient has fistula that has never been used. Primary nephrologist is Dr. Marval Regal. Likely represents acute on chronic renal failure due to decreased PO intake and volume depletion.  BUN has been rising as well and likely is contributing to patients intermittent AMS. Prograf toxicity is possible. - Consult Renal, appreciate rec's   Bronchiectasis s/p double lung transplant  The patient is compliant with immunosuppressive regimen. He reports that everything has been going well. No evidence of chest complaints at this time. - continue home meds  - tacrolimus 0.5 mg BID. Of note the patient came to the hospital reporting that he was taking 1 mg qd. Our pharmacy colleagues spoke with the patients home pharmacist who confrimed that he should be taking  0.5 mg BID. - prednisone 5 mg daily   DM  Stable. Hold home meds.  -SSI   HTN  Moderately htn on admission. Likely due to acute pain/infection. BP continued to increase, but improved with the addition of amlodipine and cessation of IVFs. - continue home coreg  - on home doxazosin - started amlodipine 10 mg qd  GERD  - continue H2 blocker     Dispo: Differed given patients worsening renal function.  The patient does have a current PCP (John Mendel Corning, MD) and does not need an [facility] hospital follow-up appointment after discharge.  The patient does not have transportation limitations that hinder transportation to clinic appointments.  .Services Needed at time of discharge: Y =  Yes, Blank = No PT:   OT:   RN:   Equipment:   Other:     LOS: 5 days   Marrion Coy, MD 04/25/2014, 11:19 AM Attending Medicine: He has turned the corner!  Alert, oriented, interactive.  Transitioned to oral acyclovir for Herpes Zoster.  Rising BUN.. No uremic sxs at present but anticipate he will need dialysis soon.  We will defer to Nephrology. We should be able to discharge him soon. [name], MD, [place]  Hematology-Oncology/Internal Medicine

## 2014-04-26 LAB — CBC WITH DIFFERENTIAL/PLATELET
Basophils Absolute: 0 10*3/uL (ref 0.0–0.1)
Basophils Relative: 0 % (ref 0–1)
EOS ABS: 0.1 10*3/uL (ref 0.0–0.7)
EOS PCT: 2 % (ref 0–5)
HCT: 31.4 % — ABNORMAL LOW (ref 39.0–52.0)
Hemoglobin: 10.7 g/dL — ABNORMAL LOW (ref 13.0–17.0)
LYMPHS ABS: 0.8 10*3/uL (ref 0.7–4.0)
Lymphocytes Relative: 10 % — ABNORMAL LOW (ref 12–46)
MCH: 33.1 pg (ref 26.0–34.0)
MCHC: 34.1 g/dL (ref 30.0–36.0)
MCV: 97.2 fL (ref 78.0–100.0)
Monocytes Absolute: 0.9 10*3/uL (ref 0.1–1.0)
Monocytes Relative: 11 % (ref 3–12)
Neutro Abs: 6.4 10*3/uL (ref 1.7–7.7)
Neutrophils Relative %: 77 % (ref 43–77)
PLATELETS: 158 10*3/uL (ref 150–400)
RBC: 3.23 MIL/uL — AB (ref 4.22–5.81)
RDW: 13.7 % (ref 11.5–15.5)
WBC: 8.3 10*3/uL (ref 4.0–10.5)

## 2014-04-26 LAB — BASIC METABOLIC PANEL
BUN: 90 mg/dL — ABNORMAL HIGH (ref 6–23)
CHLORIDE: 95 meq/L — AB (ref 96–112)
CO2: 27 meq/L (ref 19–32)
CREATININE: 6.4 mg/dL — AB (ref 0.50–1.35)
Calcium: 9.3 mg/dL (ref 8.4–10.5)
GFR calc non Af Amer: 8 mL/min — ABNORMAL LOW (ref >90)
GFR, EST AFRICAN AMERICAN: 9 mL/min — AB (ref >90)
Glucose, Bld: 253 mg/dL — ABNORMAL HIGH (ref 70–99)
Potassium: 5.2 mEq/L (ref 3.7–5.3)
Sodium: 138 mEq/L (ref 137–147)

## 2014-04-26 LAB — GLUCOSE, CAPILLARY
Glucose-Capillary: 243 mg/dL — ABNORMAL HIGH (ref 70–99)
Glucose-Capillary: 380 mg/dL — ABNORMAL HIGH (ref 70–99)
Glucose-Capillary: 288 mg/dL — ABNORMAL HIGH (ref 70–99)
Glucose-Capillary: 204 mg/dL — ABNORMAL HIGH (ref 70–99)

## 2014-04-26 LAB — TACROLIMUS LEVEL: Tacrolimus (FK506) - LabCorp: 7.6 ng/mL

## 2014-04-26 MED ORDER — INSULIN ASPART 100 UNIT/ML ~~LOC~~ SOLN
0.0000 [IU] | Freq: Three times a day (TID) | SUBCUTANEOUS | Status: DC
Start: 2014-04-26 — End: 2014-04-28
  Administered 2014-04-26: 8 [IU] via SUBCUTANEOUS
  Administered 2014-04-26: 15 [IU] via SUBCUTANEOUS
  Administered 2014-04-27 (×2): 8 [IU] via SUBCUTANEOUS
  Administered 2014-04-27 – 2014-04-28 (×3): 5 [IU] via SUBCUTANEOUS

## 2014-04-26 MED ORDER — INSULIN ASPART 100 UNIT/ML ~~LOC~~ SOLN
0.0000 [IU] | Freq: Every day | SUBCUTANEOUS | Status: DC
Start: 2014-04-26 — End: 2014-04-28
  Administered 2014-04-26: 2 [IU] via SUBCUTANEOUS
  Administered 2014-04-27: 5 [IU] via SUBCUTANEOUS

## 2014-04-26 NOTE — Progress Notes (Signed)
Subjective:  Patient reports three episodes of diarrhea this am. No abdominal pain.   Objective: Vital signs in last 24 hours: Filed Vitals:   04/25/14 0552 04/25/14 1425 04/25/14 2216 04/26/14 0602  BP: 154/68 152/62 148/68 149/74  Pulse: 93 90 94 96  Temp: 98.6 F (37 C) 98.7 F (37.1 C) 98.1 F (36.7 C) 97.6 F (36.4 C)  TempSrc: Oral Oral Oral Oral  Resp: 16 17 17 15   Height:      Weight: 161 lb 2.5 oz (73.1 kg)   161 lb 9.6 oz (73.3 kg)  SpO2: 91% 93% 92% 98%   Weight change: 7.1 oz (0.2 kg)  Intake/Output Summary (Last 24 hours) at 04/26/14 0845 Last data filed at 04/26/14 0604  Gross per 24 hour  Intake   1563 ml  Output    850 ml  Net    713 ml   Physical Exam  Constitutional: He is oriented to person, place, and time.  Abdominal: Soft. Bowel sounds are normal. He exhibits no distension. There is no tenderness. There is no rebound and no guarding.  Musculoskeletal: He exhibits tenderness.  The patients flank pain is stable at this time.  Neurological: He is alert and oriented to person, place, and time.  Skin: Rash noted.  Vesicular rash in T12 (left side)  Psychiatric: He has a normal mood and affect. His behavior is normal.    Lab Results: Basic Metabolic Panel:  Recent Labs Lab 04/20/14 0825  04/24/14 0528 04/25/14 0544  NA 141  < > 145 143  K 5.0  < > 5.4* 5.1  CL 99  < > 96 97  CO2 25  < > 27 30  GLUCOSE 154*  < > 268* 268*  BUN 61*  < > 77* 89*  CREATININE 5.58*  < > 5.94* 6.38*  CALCIUM 10.5  < > 10.7* 9.9  MG 2.1  --   --   --   PHOS 4.9*  < > 6.5* 6.0*  < > = values in this interval not displayed. Liver Function Tests:  Recent Labs Lab 04/20/14 0825 04/21/14 0514  04/24/14 0528 04/25/14 0544  AST 17 16  --   --   --   ALT 10 9  --   --   --   ALKPHOS 61 54  --   --   --   BILITOT 0.5 0.6  --   --   --   PROT 7.6 7.0  --   --   --   ALBUMIN 3.7 3.3*  < > 3.6 3.2*  < > = values in this interval not displayed. CBC:  Recent  Labs Lab 04/25/14 0544 04/26/14 0445  WBC 8.2 8.3  NEUTROABS 6.3 6.4  HGB 9.9* 10.7*  HCT 30.8* 31.4*  MCV 100.0 97.2  PLT 142* 158   CBG:  Recent Labs Lab 04/24/14 1658 04/24/14 2250 04/25/14 0736 04/25/14 1149 04/25/14 1708 04/25/14 2213  GLUCAP 204* 304* 240* 266* 201* 265*   Urinalysis:  Recent Labs Lab 04/20/14 1059  COLORURINE YELLOW  LABSPEC 1.015  PHURINE 7.0  GLUCOSEU 100*  HGBUR TRACE*  BILIRUBINUR NEGATIVE  KETONESUR NEGATIVE  PROTEINUR 100*  UROBILINOGEN 0.2  NITRITE NEGATIVE  LEUKOCYTESUR NEGATIVE   Studies/Results: No results found. Medications: I have reviewed the patient's current medications. Scheduled Meds: . acyclovir  800 mg Oral Daily  . amLODipine  10 mg Oral Daily  . aspirin EC  81 mg Oral Daily  . carvedilol  25 mg Oral BID WC  . doxazosin  4 mg Oral Daily  . famotidine  20 mg Oral Daily  . insulin aspart  0-15 Units Subcutaneous TID WC  . insulin aspart  0-5 Units Subcutaneous QHS  . lanthanum  500 mg Oral TID WC  . predniSONE  5 mg Oral BID WC  . sodium bicarbonate  1,300 mg Oral TID  . sodium chloride  3 mL Intravenous Q12H  . tacrolimus  0.5 mg Oral BID   Continuous Infusions: . sodium chloride 10 mL/hr at 04/23/14 1343   PRN Meds:.acetaminophen, acetaminophen, ondansetron (ZOFRAN) IV, ondansetron, oxyCODONE Assessment/Plan: Principal Problem:   Herpes zoster Active Problems:   DIABETES MELLITUS   Complications of transplanted lung   Diverticulitis   CKD (chronic kidney disease) stage 5, GFR less than 15 ml/min   Muscle spasm of back   History of lung transplant  Diarrhea Abdomen is benign. Likely due to recent abx use. Will check C diff - IVFs at 50 cc/hr - Encourage PO intake of fluids.  AMS The patient has had intermitted confusion while hospitalized. This was likely multifactorial. Contributing factors include IV ativan, IV narcotics, acute illness (diverticulitis and shingles), acute pain, and uremia. Plan  to minimize narcotics, avoid BZDs and control pain with home regimen of oxycodone IR. Renal has been consulted and will provide rec's regarding the patients possible need for HD. While the patients uremia has increased on 5/22, his mental status is actually much improved. Plan to continue to monitor.  Shingles- The patients back pain appears most likely to be secondary to shingles in T12 dermatome. The patient is immunosuppressed. Further he has CKD 5. Plan for acyclovir per pharmacy.  Of note the patient takes 5 mg oxycodone IR as needed for chronic pain. Typically takes 2 tablets daily.  -acyclovir per pharmacy (now on 800 mg BID PO) - oxycodone 5 mg IR q 6 hr prn (hold for AMS)  Mild diverticulitis - resolved The patient has a benign abdominal exam. He is tolerating PO w/o problems and is having normal BMs.  End stage CKD (GFR 9), G5  Patients Cr 5.6 on admission, trended up 6.4. Baseline 4-4.8 (per care everywhere). Patient has fistula that has never been used. Primary nephrologist is Dr. Marval Regal. Likely represents acute on chronic renal failure due to decreased PO intake and volume depletion.  BUN has been rising as well and likely is contributing to patients intermittent AMS. Prograf toxicity is possible. - F/U Prograf level (pending) - Consult Renal, appreciate rec's   Bronchiectasis s/p double lung transplant  The patient is compliant with immunosuppressive regimen. He reports that everything has been going well. No evidence of chest complaints at this time. - continue home meds  - tacrolimus 0.5 mg BID. Of note the patient came to the hospital reporting that he was taking 1 mg qd. Our pharmacy colleagues spoke with the patients home pharmacist who confrimed that he should be taking  0.5 mg BID. - prednisone 5 mg daily   DM  Stable. Hold home meds.  -SSI   HTN  Moderately htn on admission. Likely due to acute pain/infection. BP continued to increase, but improved with the  addition of amlodipine and cessation of IVFs. - continue home coreg  - on home doxazosin - started amlodipine 10 mg qd  GERD  - continue H2 blocker    Dispo: Differed given patients worsening renal function.  The patient does have a current PCP Mikey College, MD) and  does not need an [facility] hospital follow-up appointment after discharge.  The patient does not have transportation limitations that hinder transportation to clinic appointments.  .Services Needed at time of discharge: Y = Yes, Blank = No PT:   OT:   RN:   Equipment:   Other:     LOS: 6 days   Marrion Coy, MD 04/26/2014, 8:45 AM

## 2014-04-26 NOTE — Progress Notes (Signed)
Pt does not wish to have the IV hooked up at this time, taking po fluids fairly well.

## 2014-04-26 NOTE — Progress Notes (Signed)
Subjective:  More alert and talkative- wife says still confused but trending better- at about 80%  - UOP  better but creatinine stable-- pt saying he is eating  Objective Vital signs in last 24 hours: Filed Vitals:   04/25/14 0552 04/25/14 1425 04/25/14 2216 04/26/14 0602  BP: 154/68 152/62 148/68 149/74  Pulse: 93 90 94 96  Temp: 98.6 F (37 C) 98.7 F (37.1 C) 98.1 F (36.7 C) 97.6 F (36.4 C)  TempSrc: Oral Oral Oral Oral  Resp: 16 17 17 15   Height:      Weight: 73.1 kg (161 lb 2.5 oz)   73.3 kg (161 lb 9.6 oz)  SpO2: 91% 93% 92% 98%   Weight change: 0.2 kg (7.1 oz)  Intake/Output Summary (Last 24 hours) at 04/26/14 1143 Last data filed at 04/26/14 0604  Gross per 24 hour  Intake   1083 ml  Output    850 ml  Net    233 ml    Assessment/ Plan: Pt is a 73 y.o. yo male who was admitted on 04/20/2014 with abdominal/back pain - possible intraabdominal abscess- also with advanced CKD at baseline Assessment/Plan: 1. Renal- according to wife- baseline CKD with creatinine as high as 5.  Probably secondary to prograf toxicity from lung transplants.  He has an AVF in place- no indications for dialysis right now but we will continue to follow.   Might be difficult to tell if he is uremic in this situation.  I did say to wife that if he stalls or regresses that could be our indication to begin dialysis- but so far still improving- continue to assess on a daily basis- if still improving and kidney function no worse tomorrow- I thin OK for discharge. 2. Abdominal /back pain/zoster- possible diverticulitis with abscess- now possibly c/w zoster- on acyclovir- adjusted for renal function. - previously  needing much pain meds but seems to be trending better 3. Anemia- not too significant at this time- no procrit as OP 4. Secondary hyperparathyroidism- on home phoslo but calcium now high, have changed to fosrenol 5. HTN/volume- have stopped IVF- is on home meds plus amlodipine 6. Hyperkalemia-  -  stable and slightly up- will follow 7. Dispo- looking to transition him home but is still slightly confused and not eating the best- but trending better according to his wife. As long as this is the case I think he could go home and be followed as OP  But  again if continues to fail to thrive may be our indication to start HD  [place]: Basic Metabolic Panel:  Recent Labs Lab 04/23/14 0550 04/24/14 0528 04/25/14 0544 04/26/14 0805  NA 143 145 143 138  K 5.1 5.4* 5.1 5.2  CL 96 96 97 95*  CO2 28 27 30 27   GLUCOSE 211* 268* 268* 253*  BUN 72* 77* 89* 90*  CREATININE 5.90* 5.94* 6.38* 6.40*  CALCIUM 10.0 10.7* 9.9 9.3  PHOS 6.6* 6.5* 6.0*  --    Liver Function Tests:  Recent Labs Lab 04/20/14 0825 04/21/14 0514  04/23/14 0550 04/24/14 0528 04/25/14 0544  AST 17 16  --   --   --   --   ALT 10 9  --   --   --   --   ALKPHOS 61 54  --   --   --   --   BILITOT 0.5 0.6  --   --   --   --  PROT 7.6 7.0  --   --   --   --   ALBUMIN 3.7 3.3*  < > 3.4* 3.6 3.2*  < > = values in this interval not displayed. No results found for this basename: LIPASE, AMYLASE,  in the last 168 hours No results found for this basename: AMMONIA,  in the last 168 hours CBC:  Recent Labs Lab 04/22/14 0635 04/23/14 1155 04/24/14 0830 04/25/14 0544 04/26/14 0445  WBC 7.4 12.0* 9.6 8.2 8.3  NEUTROABS 5.9  --  8.0* 6.3 6.4  HGB 9.7* 10.2* 10.0* 9.9* 10.7*  HCT 28.8* 30.3* 30.6* 30.8* 31.4*  MCV 97.0 97.4 99.4 100.0 97.2  PLT 148* 141* 138* 142* 158   Cardiac Enzymes: No results found for this basename: CKTOTAL, CKMB, CKMBINDEX, TROPONINI,  in the last 168 hours CBG:  Recent Labs Lab 04/24/14 2250 04/25/14 0736 04/25/14 1149 04/25/14 1708 04/25/14 2213  GLUCAP 304* 240* 266* 201* 265*    Iron Studies: No results found for this basename: IRON, TIBC, TRANSFERRIN, FERRITIN,  in the last 72 hours Studies/Results: No results found. Medications: Infusions: .  sodium chloride 50 mL/hr at 04/26/14 1009    Scheduled Medications: . acyclovir  800 mg Oral Daily  . amLODipine  10 mg Oral Daily  . aspirin EC  81 mg Oral Daily  . carvedilol  25 mg Oral BID WC  . doxazosin  4 mg Oral Daily  . famotidine  20 mg Oral Daily  . insulin aspart  0-15 Units Subcutaneous TID WC  . insulin aspart  0-5 Units Subcutaneous QHS  . lanthanum  500 mg Oral TID WC  . predniSONE  5 mg Oral BID WC  . sodium bicarbonate  1,300 mg Oral TID  . sodium chloride  3 mL Intravenous Q12H  . tacrolimus  0.5 mg Oral BID    have reviewed scheduled and prn medications.  Physical Exam: General: awake and alert Heart:  RRR Lungs: mostly clear Abdomen: distended  Extremities: no edema Left sided AVF would be ready to use    04/26/2014,11:43 AM  LOS: 6 days

## 2014-04-27 DIAGNOSIS — A498 Other bacterial infections of unspecified site: Secondary | ICD-10-CM

## 2014-04-27 LAB — CBC WITH DIFFERENTIAL/PLATELET
BASOS PCT: 0 % (ref 0–1)
Basophils Absolute: 0 10*3/uL (ref 0.0–0.1)
EOS ABS: 0.1 10*3/uL (ref 0.0–0.7)
Eosinophils Relative: 1 % (ref 0–5)
HEMATOCRIT: 31.2 % — AB (ref 39.0–52.0)
HEMOGLOBIN: 10.5 g/dL — AB (ref 13.0–17.0)
Lymphocytes Relative: 15 % (ref 12–46)
Lymphs Abs: 1.1 10*3/uL (ref 0.7–4.0)
MCH: 32.5 pg (ref 26.0–34.0)
MCHC: 33.7 g/dL (ref 30.0–36.0)
MCV: 96.6 fL (ref 78.0–100.0)
MONO ABS: 0.9 10*3/uL (ref 0.1–1.0)
MONOS PCT: 12 % (ref 3–12)
Neutro Abs: 5.6 10*3/uL (ref 1.7–7.7)
Neutrophils Relative %: 72 % (ref 43–77)
Platelets: 153 10*3/uL (ref 150–400)
RBC: 3.23 MIL/uL — ABNORMAL LOW (ref 4.22–5.81)
RDW: 13.9 % (ref 11.5–15.5)
WBC: 7.8 10*3/uL (ref 4.0–10.5)

## 2014-04-27 LAB — GLUCOSE, CAPILLARY
GLUCOSE-CAPILLARY: 283 mg/dL — AB (ref 70–99)
GLUCOSE-CAPILLARY: 340 mg/dL — AB (ref 70–99)
Glucose-Capillary: 245 mg/dL — ABNORMAL HIGH (ref 70–99)
Glucose-Capillary: 292 mg/dL — ABNORMAL HIGH (ref 70–99)

## 2014-04-27 LAB — BASIC METABOLIC PANEL
BUN: 96 mg/dL — AB (ref 6–23)
CO2: 28 mEq/L (ref 19–32)
CREATININE: 6.78 mg/dL — AB (ref 0.50–1.35)
Calcium: 8.8 mg/dL (ref 8.4–10.5)
Chloride: 95 mEq/L — ABNORMAL LOW (ref 96–112)
GFR, EST AFRICAN AMERICAN: 9 mL/min — AB (ref >90)
GFR, EST NON AFRICAN AMERICAN: 7 mL/min — AB (ref >90)
Glucose, Bld: 220 mg/dL — ABNORMAL HIGH (ref 70–99)
Potassium: 4.9 mEq/L (ref 3.7–5.3)
Sodium: 138 mEq/L (ref 137–147)

## 2014-04-27 LAB — CLOSTRIDIUM DIFFICILE BY PCR: Toxigenic C. Difficile by PCR: POSITIVE — AB

## 2014-04-27 MED ORDER — ACYCLOVIR 200 MG PO CAPS: 800.0000 mg | ORAL_CAPSULE | Freq: Every day | ORAL | Status: DC

## 2014-04-27 MED ORDER — AMLODIPINE BESYLATE 10 MG PO TABS: 10.0000 mg | ORAL_TABLET | Freq: Every day | ORAL | Status: DC

## 2014-04-27 MED ORDER — VANCOMYCIN 50 MG/ML ORAL SOLUTION
125.0000 mg | Freq: Four times a day (QID) | ORAL | Status: DC
  Administered 2014-04-27 – 2014-04-28 (×5): 125 mg via ORAL
  Filled 2014-04-27 (×8): qty 2.5

## 2014-04-27 MED ORDER — OXYCODONE HCL 5 MG PO TABS: 5.0000 mg | ORAL_TABLET | Freq: Four times a day (QID) | ORAL | Status: DC | PRN

## 2014-04-27 MED ORDER — SULFAMETHOXAZOLE-TRIMETHOPRIM 400-80 MG PO TABS
1.0000 | ORAL_TABLET | ORAL | Status: DC
  Administered 2014-04-28: 1 via ORAL
  Filled 2014-04-27: qty 1

## 2014-04-27 NOTE — Progress Notes (Signed)
Subjective: NAEON. Pt has had full resolution of diarrhea. Pt is adamantly refusing HD despite no improvement in Cr and increased BUN that may have contributed to his lack of appetite and reports of confusion by the wife. Pt feels he is totally at his baseline and will f/u with nephrology "when I feel ready."   Objective: Vital signs in last 24 hours: Filed Vitals:   04/26/14 0602 04/26/14 1430 04/26/14 2257 04/27/14 0456  BP: 149/74 122/55 153/67 152/69  Pulse: 96 84 94 87  Temp: 97.6 F (36.4 C) 98.1 F (36.7 C) 98.3 F (36.8 C) 98.1 F (36.7 C)  TempSrc: Oral Oral Oral Oral  Resp: 15 18 18 16   Height:      Weight: 161 lb 9.6 oz (73.3 kg)     SpO2: 98% 98% 99% 98%   Weight change:   Intake/Output Summary (Last 24 hours) at 04/27/14 0845 Last data filed at 04/26/14 1749  Gross per 24 hour  Intake    580 ml  Output    202 ml  Net    378 ml   Physical Exam  Constitutional: He is oriented to person, place, and time.  Abdominal: Soft. Bowel sounds are normal. He exhibits no distension. There is no tenderness. There is no rebound and no guarding.  Musculoskeletal: He exhibits tenderness.  The patients flank pain is stable at this time.  Neurological: He is alert and oriented to person, place, and time.  Skin: Rash noted.  Vesicular rash in T12 (left side)  Psychiatric: He has a normal mood and affect. His behavior is normal.    Lab Results: Basic Metabolic Panel:  Recent Labs Lab 04/24/14 0528 04/25/14 0544 04/26/14 0805 04/27/14 0402  NA 145 143 138 138  K 5.4* 5.1 5.2 4.9  CL 96 97 95* 95*  CO2 27 30 27 28   GLUCOSE 268* 268* 253* 220*  BUN 77* 89* 90* 96*  CREATININE 5.94* 6.38* 6.40* 6.78*  CALCIUM 10.7* 9.9 9.3 8.8  PHOS 6.5* 6.0*  --   --    Liver Function Tests:  Recent Labs Lab 04/21/14 0514  04/24/14 0528 04/25/14 0544  AST 16  --   --   --   ALT 9  --   --   --   ALKPHOS 54  --   --   --   BILITOT 0.6  --   --   --   PROT 7.0  --   --    --   ALBUMIN 3.3*  < > 3.6 3.2*  < > = values in this interval not displayed. CBC:  Recent Labs Lab 04/26/14 0445 04/27/14 0402  WBC 8.3 7.8  NEUTROABS 6.4 5.6  HGB 10.7* 10.5*  HCT 31.4* 31.2*  MCV 97.2 96.6  PLT 158 153   CBG:  Recent Labs Lab 04/25/14 2213 04/26/14 0736 04/26/14 1138 04/26/14 1721 04/26/14 2258 04/27/14 0758  GLUCAP 265* 243* 380* 288* 204* 245*   Urinalysis:  Recent Labs Lab 04/20/14 1059  COLORURINE YELLOW  LABSPEC 1.015  PHURINE 7.0  GLUCOSEU 100*  HGBUR TRACE*  BILIRUBINUR NEGATIVE  KETONESUR NEGATIVE  PROTEINUR 100*  UROBILINOGEN 0.2  NITRITE NEGATIVE  LEUKOCYTESUR NEGATIVE   Studies/Results: No results found. Medications: I have reviewed the patient's current medications. Scheduled Meds: . acyclovir  800 mg Oral Daily  . amLODipine  10 mg Oral Daily  . aspirin EC  81 mg Oral Daily  . carvedilol  25 mg Oral BID WC  .  doxazosin  4 mg Oral Daily  . famotidine  20 mg Oral Daily  . insulin aspart  0-15 Units Subcutaneous TID WC  . insulin aspart  0-5 Units Subcutaneous QHS  . lanthanum  500 mg Oral TID WC  . predniSONE  5 mg Oral BID WC  . sodium bicarbonate  1,300 mg Oral TID  . sodium chloride  3 mL Intravenous Q12H  . tacrolimus  0.5 mg Oral BID   Continuous Infusions: . sodium chloride 50 mL/hr at 04/26/14 1009   PRN Meds:.acetaminophen, acetaminophen, ondansetron (ZOFRAN) IV, ondansetron, oxyCODONE Assessment/Plan: Principal Problem:   Herpes zoster Active Problems:   DIABETES MELLITUS   Complications of transplanted lung   Diverticulitis   CKD (chronic kidney disease) stage 5, GFR less than 15 ml/min   Muscle spasm of back   History of lung transplant  Diarrhea 2/2 c. Diff  Abdomen is benign. Likely due to recent abx use. C. Diff + but pt doesn't have profound diarrhea or leukocytosis (7.8), no documented fever  - IVFs at 50 cc/hr - Encourage PO intake of fluids. -po vanc given organ transplantation    Shingles- The patients back pain appears most likely to be secondary to shingles in T12 dermatome. The patient is immunosuppressed. Further he has CKD 5. Plan for acyclovir per pharmacy.  Of note the patient takes 5 mg oxycodone IR as needed for chronic pain. Typically takes 2 tablets daily. -acyclovir per pharmacy (now on 800 mg BID PO) - oxycodone 5 mg IR q 6 hr prn (hold for AMS)  AMS multifactorial in setting of likely uremia, acute illness, and medications- resolving  The patient has had intermitted confusion while hospitalized. This was likely multifactorial. Contributing factors include IV ativan, IV narcotics, acute illness (diverticulitis and shingles), acute pain, and uremia. Plan to minimize narcotics, avoid BZDs and control pain with home regimen of oxycodone IR. Renal has been consulted and will provide rec's regarding the patients possible need for HD. While the patients uremia has increased on 5/22, his mental status is actually much improved. Plan to continue to monitor.  End stage CKD (GFR 9), G5  Patients Cr 5.6 on admission, trended up 6.4. Baseline 4-4.8 (per care everywhere). Patient has fistula that has never been used. Primary nephrologist is Dr. Marval Regal. Likely represents acute on chronic renal failure due to decreased PO intake and volume depletion.  BUN has been rising as well and likely is contributing to patients intermittent AMS. Prograf toxicity is possible. - F/U Prograf level 7.6 which is within range for 2wks+ transplant patients  - Consult Renal, appreciate rec's   Bronchiectasis s/p double lung transplant  The patient is compliant with immunosuppressive regimen. He reports that everything has been going well. No evidence of chest complaints at this time. - continue home meds  - tacrolimus 0.5 mg BID. Of note the patient came to the hospital reporting that he was taking 1 mg qd. Our pharmacy colleagues spoke with the patients home pharmacist who confrimed  that he should be taking  0.5 mg BID. - prednisone 5 mg daily   DM  Stable. Hold home meds.  -SSI   HTN  Moderately htn on admission. Likely due to acute pain/infection. BP continued to increase, but improved with the addition of amlodipine and cessation of IVFs. - continue home coreg  - on home doxazosin - started amlodipine 10 mg qd  GERD  - continue H2 blocker    Dispo: pending response to oral vanc  The patient does have a current PCP (John Mendel Corning, MD) and does not need an [facility] hospital follow-up appointment after discharge.  The patient does not have transportation limitations that hinder transportation to clinic appointments.  .Services Needed at time of discharge: Y = Yes, Blank = No PT:   OT:   RN:   Equipment:   Other:     LOS: 7 days   Clinton Gallant, MD 04/27/2014, 8:45 AM

## 2014-04-27 NOTE — Progress Notes (Signed)
Subjective:  More alert and talkative- wife says  trending better- really is at baseline confusion wise - UOP  Reasonable/ creatinine stable to slightly worse -- pt saying he is eating  Objective Vital signs in last 24 hours: Filed Vitals:   04/26/14 0602 04/26/14 1430 04/26/14 2257 04/27/14 0456  BP: 149/74 122/55 153/67 152/69  Pulse: 96 84 94 87  Temp: 97.6 F (36.4 C) 98.1 F (36.7 C) 98.3 F (36.8 C) 98.1 F (36.7 C)  TempSrc: Oral Oral Oral Oral  Resp: 15 18 18 16   Height:      Weight: 73.3 kg (161 lb 9.6 oz)     SpO2: 98% 98% 99% 98%   Weight change:   Intake/Output Summary (Last 24 hours) at 04/27/14 1132 Last data filed at 04/26/14 1749  Gross per 24 hour  Intake    340 ml  Output    202 ml  Net    138 ml    Assessment/ Plan: Pt is a 73 y.o. yo male who was admitted on 04/20/2014 with abdominal/back pain - possible intraabdominal abscess- also with advanced CKD at baseline Assessment/Plan: 1. Renal- according to wife- baseline CKD with creatinine as high as 5.  They also are clinging to the fact that his creatinine was 10 at one time and got better. Ckd likely secondary to prograf toxicity from lung transplants.  He has an AVF in place- no absolute indications for dialysis right now.  Might be difficult to tell if he is uremic in this situation.  I did say to wife that if he stalls or regresses clinically that could be our indication to begin dialysis- but so far still improving clinically- at baseline and wife is comfortable taking him home- I think OK for discharge today- kidney function slightly worse but really stable with a 0.3 change in creatinine at this level not significant.  I am confident that wife understands what I am saying- that if he fails to thrive that might be our indication to start HD and we could do it as an OP.  Will get follow up labs this week to assess 2. Abdominal /back pain/zoster- initially thought diverticulitis with abscess- now  c/w zoster- on  acyclovir- adjusted for renal function. - previously  needing much pain meds but seems to be trending better 3. Anemia- not too significant at this time- no procrit as OP 4. Secondary hyperparathyroidism- on home phoslo but calcium got high, had changed to fosrenol- but OK to discharge on home med of phoslo 5. HTN/volume- have stopped IVF- is on home meds plus amlodipine 6. Hyperkalemia-  - stable and slightly up- will follow 7. Dispo-  transition him home - clinically improving in spite of poor GFR.  As long as this is the case I think he could go home and be followed as OP  But again if continues to fail to thrive may be our indication to start HD- I do think that wife understands this  Tyler Mueller    Labs: Basic Metabolic Panel:  Recent Labs Lab 04/23/14 0550 04/24/14 0528 04/25/14 0544 04/26/14 0805 04/27/14 0402  NA 143 145 143 138 138  K 5.1 5.4* 5.1 5.2 4.9  CL 96 96 97 95* 95*  CO2 28 27 30 27 28   GLUCOSE 211* 268* 268* 253* 220*  BUN 72* 77* 89* 90* 96*  CREATININE 5.90* 5.94* 6.38* 6.40* 6.78*  CALCIUM 10.0 10.7* 9.9 9.3 8.8  PHOS 6.6* 6.5* 6.0*  --   --  Liver Function Tests:  Recent Labs Lab 04/21/14 0514  04/23/14 0550 04/24/14 0528 04/25/14 0544  AST 16  --   --   --   --   ALT 9  --   --   --   --   ALKPHOS 54  --   --   --   --   BILITOT 0.6  --   --   --   --   PROT 7.0  --   --   --   --   ALBUMIN 3.3*  < > 3.4* 3.6 3.2*  < > = values in this interval not displayed. No results found for this basename: LIPASE, AMYLASE,  in the last 168 hours No results found for this basename: AMMONIA,  in the last 168 hours CBC:  Recent Labs Lab 04/23/14 1155 04/24/14 0830 04/25/14 0544 04/26/14 0445 04/27/14 0402  WBC 12.0* 9.6 8.2 8.3 7.8  NEUTROABS  --  8.0* 6.3 6.4 5.6  HGB 10.2* 10.0* 9.9* 10.7* 10.5*  HCT 30.3* 30.6* 30.8* 31.4* 31.2*  MCV 97.4 99.4 100.0 97.2 96.6  PLT 141* 138* 142* 158 153   Cardiac Enzymes: No results found for  this basename: CKTOTAL, CKMB, CKMBINDEX, TROPONINI,  in the last 168 hours CBG:  Recent Labs Lab 04/26/14 1138 04/26/14 1721 04/26/14 2258 04/27/14 0758 04/27/14 1113  GLUCAP 380* 288* 204* 245* 292*    Iron Studies: No results found for this basename: IRON, TIBC, TRANSFERRIN, FERRITIN,  in the last 72 hours Studies/Results: No results found. Medications: Infusions: . sodium chloride 50 mL/hr at 04/26/14 1009    Scheduled Medications: . acyclovir  800 mg Oral Daily  . amLODipine  10 mg Oral Daily  . aspirin EC  81 mg Oral Daily  . carvedilol  25 mg Oral BID WC  . doxazosin  4 mg Oral Daily  . famotidine  20 mg Oral Daily  . insulin aspart  0-15 Units Subcutaneous TID WC  . insulin aspart  0-5 Units Subcutaneous QHS  . lanthanum  500 mg Oral TID WC  . predniSONE  5 mg Oral BID WC  . sodium bicarbonate  1,300 mg Oral TID  . sodium chloride  3 mL Intravenous Q12H  . tacrolimus  0.5 mg Oral BID    have reviewed scheduled and prn medications.  Physical Exam: General: awake and alert Heart:  RRR Lungs: mostly clear Abdomen: distended  Extremities: no edema Left sided AVF would be ready to use    04/27/2014,11:32 AM  LOS: 7 days

## 2014-04-27 NOTE — Progress Notes (Signed)
Lab called Pt is CDiff positive, Dr. Algis Liming called and notified.

## 2014-04-28 LAB — CBC WITH DIFFERENTIAL/PLATELET
BASOS ABS: 0 10*3/uL (ref 0.0–0.1)
Basophils Relative: 0 % (ref 0–1)
EOS PCT: 1 % (ref 0–5)
Eosinophils Absolute: 0.1 10*3/uL (ref 0.0–0.7)
HEMATOCRIT: 28.5 % — AB (ref 39.0–52.0)
Hemoglobin: 9.5 g/dL — ABNORMAL LOW (ref 13.0–17.0)
LYMPHS ABS: 1.3 10*3/uL (ref 0.7–4.0)
Lymphocytes Relative: 18 % (ref 12–46)
MCH: 31.8 pg (ref 26.0–34.0)
MCHC: 33.3 g/dL (ref 30.0–36.0)
MCV: 95.3 fL (ref 78.0–100.0)
MONO ABS: 0.6 10*3/uL (ref 0.1–1.0)
Monocytes Relative: 9 % (ref 3–12)
Neutro Abs: 5.1 10*3/uL (ref 1.7–7.7)
Neutrophils Relative %: 72 % (ref 43–77)
PLATELETS: 150 10*3/uL (ref 150–400)
RBC: 2.99 MIL/uL — AB (ref 4.22–5.81)
RDW: 13.7 % (ref 11.5–15.5)
WBC: 7.1 10*3/uL (ref 4.0–10.5)

## 2014-04-28 LAB — GLUCOSE, CAPILLARY
GLUCOSE-CAPILLARY: 211 mg/dL — AB (ref 70–99)
GLUCOSE-CAPILLARY: 250 mg/dL — AB (ref 70–99)

## 2014-04-28 LAB — BASIC METABOLIC PANEL
BUN: 95 mg/dL — ABNORMAL HIGH (ref 6–23)
CALCIUM: 8.5 mg/dL (ref 8.4–10.5)
CO2: 26 meq/L (ref 19–32)
Chloride: 98 mEq/L (ref 96–112)
Creatinine, Ser: 6.79 mg/dL — ABNORMAL HIGH (ref 0.50–1.35)
GFR calc Af Amer: 8 mL/min — ABNORMAL LOW (ref >90)
GFR calc non Af Amer: 7 mL/min — ABNORMAL LOW (ref >90)
Glucose, Bld: 206 mg/dL — ABNORMAL HIGH (ref 70–99)
Potassium: 4.8 mEq/L (ref 3.7–5.3)
SODIUM: 139 meq/L (ref 137–147)

## 2014-04-28 MED ORDER — VANCOMYCIN 50 MG/ML ORAL SOLUTION
125.0000 mg | Freq: Four times a day (QID) | ORAL | Status: DC
Start: 2014-04-28 — End: 2022-02-11

## 2014-04-28 NOTE — Discharge Summary (Signed)
Name: Tyler Mueller MRN: [id number] DOB: 1941-10-29 73 y.o. PCP: Mikey College, MD  Date of Admission: 04/20/2014  7:57 AM Date of Discharge: 04/28/2014 Attending Physician: Annia Belt, MD  Discharge Diagnosis: 1. Herpes zoster T12 dermatome eruption 2. C. Diff diarrhea 3. 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 IDDM type 2  5. 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     amLODipine 10 MG tablet  Commonly known as:  NORVASC  Take 1 tablet (10 mg total) by mouth daily.     aspirin EC 81 MG tablet  Take 81 mg by mouth daily.     calcium acetate 667 MG capsule  Commonly known as:  PHOSLO  Take 667 mg by mouth 2 (two) times daily with a meal.     carvedilol 25 MG tablet  Commonly known as:  COREG  Take 25 mg by mouth 2 (two) times daily with a meal.     doxazosin 4 MG tablet  Commonly known as:  CARDURA  Take 4 mg by mouth daily.     ferrous gluconate 225 (27 FE) MG tablet  Commonly known as:  FERGON  Take 240 mg by mouth 3 (three) times daily with meals.     glipiZIDE 5 MG tablet  Commonly known as:  GLUCOTROL  Take 5 mg by mouth daily before breakfast.     multivitamin with minerals Tabs tablet  Take 1 tablet by mouth daily.     pravastatin 40 MG tablet  Commonly known as:  PRAVACHOL  Take 20 mg by mouth daily.     predniSONE 5 MG tablet  Commonly known as:  DELTASONE  Take 5 mg by mouth 2 (two) times daily with a meal.     ranitidine 150 MG tablet  Commonly known as:  ZANTAC  Take 150 mg by mouth 2 (two) times daily.     sodium bicarbonate 650 MG tablet  Take 1,300 mg by mouth 3 (three) times daily.     sulfamethoxazole-trimethoprim 400-80 MG per tablet  Commonly known as:  BACTRIM,SEPTRA  Take 1 tablet by mouth 3 (three) times a week.     tacrolimus 0.5 MG capsule    Commonly known as:  PROGRAF  Take 0.5 mg by mouth 2 (two) times daily.     vancomycin 50 mg/mL oral solution  Commonly known as:  VANCOCIN  Take 2.5 mLs (125 mg total) by mouth every 6 (six) hours.        Disposition and follow-up:   [name] was discharged from [facility] in Stable condition.  At the hospital follow up visit please address:  1.  Compliance with medications, reassess willingness to begin HD, resolution of vesicular rash and no signs of postherpetic neuralgia  2.  Labs / imaging needed at time of follow-up: Bmet  3.  Pending labs/ test needing follow-up: none   Follow-up Appointments: Follow-up Information   Follow up with Mikey College, MD. Schedule an appointment as soon as possible for a visit in 1 week.   Specialty:  Internal Medicine   Contact information:   [facility] [place] [address] [phone]       Schedule an appointment as soon as possible for a visit with COLADONATO,JOSEPH  A, MD. (for review of your kidney function and when you are ready for dialysis )    Specialty:  Nephrology   Contact information:   [place] [address] [phone]       Follow up with Mikey College, MD. Schedule an appointment as soon as possible for a visit in 1 week.   Specialty:  Internal Medicine   Contact information:   [facility] [address] [place]  [address] [phone]       Discharge Instructions: Discharge Instructions   Call MD for:  persistant dizziness or light-headedness    Complete by:  As directed      Call MD for:  persistant nausea and vomiting    Complete by:  As directed      Call MD for:  temperature >100.4    Complete by:  As directed      Increase activity slowly    Complete by:  As directed            Consultations: Treatment Team:  Windy Kalata, MD of nephrology   Procedures Performed:  Ct Abdomen Pelvis Wo  Contrast  04/20/2014   CLINICAL DATA:  Back pain. Remote history of lung transplant. Pre dialysis.  EXAM: CT ABDOMEN AND PELVIS WITHOUT CONTRAST  TECHNIQUE: Multidetector CT imaging of the abdomen and pelvis was performed following the standard protocol without IV contrast.  COMPARISON:  No similar prior exam is available at this institution for comparison or on [facility].  FINDINGS: Scarring is noted at the lung bases. Trace bilateral pleural fluid. Evidence of median sternotomy. Cardiomegaly noted.  Calcified granulomas are noted in the spleen. Unenhanced liver, gallbladder, adrenal glands, spleen are unremarkable. Nonobstructing bilateral renal calculi are noted, largest 5 mm right upper renal pole. 2.1 cm exophytic right mid renal cortical cyst noted. No hydroureteronephrosis. No radiopaque ureteral or bladder calculus. No ascites or lymphadenopathy or free air. Severe atheromatous aortic calcification without aneurysm.  Areas of stranding within the anterior abdominal wall subcutaneous fat likely reflect injection sites.  There is focal sigmoid colonic wall thickening image 72 with an adjacent thick-walled pericolonic abscess or dilated diverticulum containing air-fluid level measuring 4.4 cm image 67. The remainder of the large bowel and small bowel appear unremarkable. No pelvic free fluid or lymphadenopathy. Bladder is normal. No acute osseous abnormality. The degenerative change identified in the spine. Minimal superior endplate compression deformities at L2 and L4 are noted which could reflect disc degenerative change/ Schmorl node formation, without bony retropulsion.  IMPRESSION: Short segmental sigmoid colonic wall thickening with associated 4.4 cm contiguous pericolonic abscess or dilated diverticulum, compatible with acute diverticulitis. These results were called by telephone at the time of interpretation on 04/20/2014 at 9:13 AM to Dr. Dorie Rank , who verbally acknowledged these results.    Electronically Signed   By: Conchita Paris M.D.   On: 04/20/2014 09:13   Admission HPI: Tyler Mueller is a 73 y.o. 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 The patient was in his normal state of health until three days ago when he developed flank pain after playing a round of golf. He has since developed worsening back and flank pain. He has associated symptoms of nausea and constipation. He denies abdominal pain, SOB, chest pain, fevers, chills, malaise. He does admit to decreased oral  intake of food due to moderate nausea. He admits compliance with his medications (including his anti-rejection medications).    Hospital Course by problem list: 1. Diarrhea 2/2 c. Diff infection: on the last 2 days before discharge pt had developed some loose stools and there was suspecion of c. Diff given previous Abx use for presumed diverticulitis that was given. Pt abdominal exam was benign and never had a fever or profound leukocytosis. There maybe a question of colonization as pt later stated he had similar infection in the past and was treated. Given immonsuppressed state pt was started and given complete 14 day course of oral vancomycin.   2. Back/flank pain 2/2 Shingles- Pt initial presentation and compliant was left flank pain question of urolith and therefore CT abdomen w/o contrast given profound CKD showed possible diverticulitis. There was a question of a pericolonic abscess on admission CT. The patient was made NPO and treated with Flagyl Cipro and then Zosyn. General surgery rec to treat conservatively with NPO and IV abx on admission. On review of the CT abdomen there was very little evidence of inflammation. Thus, there may be very mild diverticulitis. Further, the possible abscess appeared to be more likely a large diverticula without complicationt. I spoke with Dr. Pearline Cables who is the attending  on call for the [facility] lung transplant team. Given the patients benign physical exam and back pain consistent with MSK pain, Dr. Pearline Cables did not recommend transfer. Patients diet was advanced and he had no complications. The abdominal exam remained benign through the entirety of the admission. Later during the admission pt had eruption of clear fluid filled vesicular rash along unilateral left sided T12 dermatome. Given The patient is immunosuppressed and further complicated by CKD 5. Plan for acyclovir per pharmacy and was given first dose IV then transitioned to po. Pt given acyclovir 800 mg qdaily PO for total of 10 days and pt already on  oxycodone 5 mg IR q 6 hr prn for chronic pain.   3. AMS multifactorial in setting of likely uremia, acute illness, and medications- resolving  The patient has had intermitted confusion while hospitalized. This was likely multifactorial. Contributing factors include IV ativan, IV narcotics, acute illness (diverticulitis and shingles), acute pain, and uremia. The use of narcotics was minimized, avoidance of further BZDs and control pain with home regimen of oxycodone IR. Pt mentation returned to baseline. At no point did pt exhibit concerning signs for Herpes encephalitis or meningitis given immunocompromised status.   4. End stage CKD (GFR 9), G5  Patients Cr 5.6 on admission, trended up 6.4. Baseline 4-4.8 (per care everywhere). Patient has fistula that has never been used. Primary nephrologist is Dr. Marval Regal. Likely represents acute on chronic renal failure due to decreased PO intake and volume depletion. BUN has been rising as well and likely is contributing to patients intermittent AMS. Prograf toxicity is possible but level 7.6 which is within range for 2wks+ transplant patients. Pt continued to have no improvement in kidney function and increased uremia during admission renal was consulted. Pt is adamantly refusing HD at this time and would like to follow up  outpatient.   5. Bronchiectasis s/p double lung transplant  The patient is compliant with immunosuppressive regimen. Continued tacrolimus 0.5 mg BID. Of note the patient came to the hospital reporting that he was taking 1 mg qd. Our pharmacy colleagues spoke with the patients home pharmacist who confrimed that he should be taking 0.5 mg BID and  prednisone 5 mg daily.  6. DM- pt maintained good control on SSI and then restarted on home doses upon discharge.    7. HTN- Moderately htn on admission. Likely due to acute pain/infection. BP continued to increase, but improved with the addition of amlodipine along with home meds of coreg and doxazosin .  8. GERD- stable and continued on H2 blocker    Discharge Vitals:   BP 161/69  Pulse 97  Temp(Src) 98.7 F (37.1 C) (Oral)  Resp 16  Ht 5\' 8"  (1.727 m)  Wt 160 lb 0.2 oz (72.58 kg)  BMI 24.34 kg/m2  SpO2 98%  Discharge Labs:  Results for orders placed during the hospital encounter of 04/20/14 (from the past 24 hour(s))  GLUCOSE, CAPILLARY     Status: Abnormal   Collection Time    04/27/14 11:13 AM      Result Value Ref Range   Glucose-Capillary 292 (*) 70 - 99 mg/dL   Comment 1 Notify RN    GLUCOSE, CAPILLARY     Status: Abnormal   Collection Time    04/27/14  5:13 PM      Result Value Ref Range   Glucose-Capillary 283 (*) 70 - 99 mg/dL  GLUCOSE, CAPILLARY     Status: Abnormal   Collection Time    04/27/14  9:38 PM      Result Value Ref Range   Glucose-Capillary 340 (*) 70 - 99 mg/dL   Comment 1 Notify RN    CBC WITH DIFFERENTIAL     Status: Abnormal   Collection Time    04/28/14  5:30 AM      Result Value Ref Range   WBC 7.1  4.0 - 10.5 K/uL   RBC 2.99 (*) 4.22 - 5.81 MIL/uL   Hemoglobin 9.5 (*) 13.0 - 17.0 g/dL   HCT 28.5 (*) 39.0 - 52.0 %   MCV 95.3  78.0 - 100.0 fL   MCH 31.8  26.0 - 34.0 pg   MCHC 33.3  30.0 - 36.0 g/dL   RDW 13.7  11.5 - 15.5 %   Platelets 150  150 - 400 K/uL   Neutrophils Relative % 72  43 - 77  %   Neutro Abs 5.1  1.7 - 7.7 K/uL   Lymphocytes Relative 18  12 - 46 %   Lymphs Abs 1.3  0.7 - 4.0 K/uL   Monocytes Relative 9  3 - 12 %   Monocytes Absolute 0.6  0.1 - 1.0 K/uL   Eosinophils Relative 1  0 - 5 %   Eosinophils Absolute 0.1  0.0 - 0.7 K/uL   Basophils Relative 0  0 - 1 %   Basophils Absolute 0.0  0.0 - 0.1 K/uL  BASIC METABOLIC PANEL     Status: Abnormal   Collection Time    04/28/14  5:30 AM      Result Value Ref Range   Sodium 139  137 - 147 mEq/L   Potassium 4.8  3.7 - 5.3 mEq/L   Chloride 98  96 - 112 mEq/L   CO2 26  19 - 32 mEq/L   Glucose, Bld 206 (*) 70 - 99 mg/dL   BUN 95 (*) 6 - 23 mg/dL   Creatinine, Ser 6.79 (*) 0.50 - 1.35 mg/dL   Calcium 8.5  8.4 - 10.5 mg/dL   GFR calc non Af Amer 7 (*) >90 mL/min   GFR calc Af Amer 8 (*) >90 mL/min  GLUCOSE, CAPILLARY     Status: Abnormal   Collection  Time    04/28/14  8:25 AM      Result Value Ref Range   Glucose-Capillary 211 (*) 70 - 99 mg/dL    Signed: Clinton Gallant, MD 04/28/2014, 10:51 AM   Time Spent on Discharge: 35 minutes Services Ordered on Discharge: none Equipment Ordered on Discharge: none

## 2014-04-28 NOTE — Progress Notes (Signed)
Subjective: NAEON. Pt is having formed loose stools this AM. States he has "had this stomach infection before." tolerated po vanc w/o concern or side effects. States "i feel good enough to go golfing." still doesn't want HD despite lab results not improving as a reflection of kidney function.   Objective: Vital signs in last 24 hours: Filed Vitals:   04/27/14 0456 04/27/14 1345 04/27/14 2146 04/28/14 0525  BP: 152/69 121/58 145/62 152/71  Pulse: 87 84 80 97  Temp: 98.1 F (36.7 C) 97.8 F (36.6 C) 97.8 F (36.6 C) 98.7 F (37.1 C)  TempSrc: Oral Oral Oral Oral  Resp: 16 18 17 16   Height:      Weight:    160 lb 0.2 oz (72.58 kg)  SpO2: 98% 99% 99% 98%   Weight change:   Intake/Output Summary (Last 24 hours) at 04/28/14 0741 Last data filed at 04/28/14 0526  Gross per 24 hour  Intake    580 ml  Output    250 ml  Net    330 ml   Physical Exam  Constitutional: He is oriented to person, place, and time.  Abdominal: Soft. Bowel sounds are normal. He exhibits no distension. There is no tenderness. There is no rebound and no guarding.  Musculoskeletal: He exhibits tenderness.  The patients flank pain is stable at this time.  Neurological: He is alert and oriented to person, place, and time.  Skin: Rash noted.  Vesicular rash in T12 (left side)  Psychiatric: He has a normal mood and affect. His behavior is normal.    Lab Results: Basic Metabolic Panel:  Recent Labs Lab 04/24/14 0528 04/25/14 0544  04/27/14 0402 04/28/14 0530  NA 145 143  < > 138 139  K 5.4* 5.1  < > 4.9 4.8  CL 96 97  < > 95* 98  CO2 27 30  < > 28 26  GLUCOSE 268* 268*  < > 220* 206*  BUN 77* 89*  < > 96* 95*  CREATININE 5.94* 6.38*  < > 6.78* 6.79*  CALCIUM 10.7* 9.9  < > 8.8 8.5  PHOS 6.5* 6.0*  --   --   --   < > = values in this interval not displayed. Liver Function Tests:  Recent Labs Lab 04/24/14 0528 04/25/14 0544  ALBUMIN 3.6 3.2*   CBC:  Recent Labs Lab 04/27/14 0402  04/28/14 0530  WBC 7.8 7.1  NEUTROABS 5.6 5.1  HGB 10.5* 9.5*  HCT 31.2* 28.5*  MCV 96.6 95.3  PLT 153 150   CBG:  Recent Labs Lab 04/26/14 1721 04/26/14 2258 04/27/14 0758 04/27/14 1113 04/27/14 1713 04/27/14 2138  GLUCAP 288* 204* 245* 292* 283* 340*   Urinalysis: No results found for this basename: COLORURINE, APPERANCEUR, LABSPEC, PHURINE, GLUCOSEU, HGBUR, BILIRUBINUR, KETONESUR, PROTEINUR, UROBILINOGEN, NITRITE, LEUKOCYTESUR,  in the last 168 hours Studies/Results: No results found. Medications: I have reviewed the patient's current medications. Scheduled Meds: . acyclovir  800 mg Oral Daily  . amLODipine  10 mg Oral Daily  . aspirin EC  81 mg Oral Daily  . carvedilol  25 mg Oral BID WC  . doxazosin  4 mg Oral Daily  . famotidine  20 mg Oral Daily  . insulin aspart  0-15 Units Subcutaneous TID WC  . insulin aspart  0-5 Units Subcutaneous QHS  . predniSONE  5 mg Oral BID WC  . sodium bicarbonate  1,300 mg Oral TID  . sodium chloride  3 mL Intravenous Q12H  .  sulfamethoxazole-trimethoprim  1 tablet Oral Once per day on Mon Wed Fri  . tacrolimus  0.5 mg Oral BID  . vancomycin  125 mg Oral 4 times per day   Continuous Infusions:   PRN Meds:.acetaminophen, acetaminophen, ondansetron (ZOFRAN) IV, ondansetron, oxyCODONE Assessment/Plan: Principal Problem:   Herpes zoster Active Problems:   DIABETES MELLITUS   Complications of transplanted lung   Diverticulitis   CKD (chronic kidney disease) stage 5, GFR less than 15 ml/min   Muscle spasm of back   History of lung transplant   Clostridium difficile infection  Diarrhea 2/2 c. Diff  Abdomen is benign. Likely due to recent abx use. C. Diff + but pt doesn't have profound diarrhea or leukocytosis (7.8), no documented fever  - IVFs at 50 cc/hr - Encourage PO intake of fluids. -po vanc given organ transplantation for 14 day treatment   Shingles- The patients back pain appears most likely to be secondary to  shingles in T12 dermatome. The patient is immunosuppressed. Further he has CKD 5. Plan for acyclovir per pharmacy.  Of note the patient takes 5 mg oxycodone IR as needed for chronic pain. Typically takes 2 tablets daily. -acyclovir per pharmacy (now on 800 mg BID PO) for total of 10 days - oxycodone 5 mg IR q 6 hr prn (hold for AMS)  AMS multifactorial in setting of likely uremia, acute illness, and medications- resolving  The patient has had intermitted confusion while hospitalized. This was likely multifactorial. Contributing factors include IV ativan, IV narcotics, acute illness (diverticulitis and shingles), acute pain, and uremia. Plan to minimize narcotics, avoid BZDs and control pain with home regimen of oxycodone IR. Renal has been consulted and will provide rec's regarding the patients possible need for HD. While the patients uremia has increased on 5/22, his mental status is actually much improved. Plan to continue to monitor.  End stage CKD (GFR 9), G5  Patients Cr 5.6 on admission, trended up 6.4. Baseline 4-4.8 (per care everywhere). Patient has fistula that has never been used. Primary nephrologist is Dr. Marval Regal. Likely represents acute on chronic renal failure due to decreased PO intake and volume depletion.  BUN has been rising as well and likely is contributing to patients intermittent AMS. Prograf toxicity is possible. - F/U Prograf level 7.6 which is within range for 2wks+ transplant patients  - Consult Renal, appreciate rec's   Bronchiectasis s/p double lung transplant  The patient is compliant with immunosuppressive regimen. He reports that everything has been going well. No evidence of chest complaints at this time. - continue home meds  - tacrolimus 0.5 mg BID. Of note the patient came to the hospital reporting that he was taking 1 mg qd. Our pharmacy colleagues spoke with the patients home pharmacist who confrimed that he should be taking  0.5 mg BID. - prednisone 5 mg  daily   DM  Stable. Hold home meds.  -SSI   HTN  Moderately htn on admission. Likely due to acute pain/infection. BP continued to increase, but improved with the addition of amlodipine and cessation of IVFs. - continue home coreg  - on home doxazosin - started amlodipine 10 mg qd  GERD  - continue H2 blocker    Dispo: pending response to oral vanc   The patient does have a current PCP (John Mendel Corning, MD) and does not need an [facility] hospital follow-up appointment after discharge.  The patient does not have transportation limitations that hinder transportation to clinic appointments.  .Services Needed at  time of discharge: Y = Yes, Blank = No PT:   OT:   RN:   Equipment:   Other:     LOS: 8 days   Clinton Gallant, MD 04/28/2014, 7:41 AM

## 2014-04-28 NOTE — Progress Notes (Signed)
Discharge instructions reviewed with patient and wife. Questions answered re: timing of next dose of meds, f/u appointments, and availability of vancomycin. M.D. notified of concerns and adjustments made. Printed AVS and prescriptions given to patient. Patient discharged to home. Accompanied by wife.

## 2014-04-30 NOTE — Discharge Summary (Signed)
Attending Physician: I attest to the accuracy of the above report by resident physician Dr Clinton Gallant

## 2014-05-07 DIAGNOSIS — N2581 Secondary hyperparathyroidism of renal origin: Secondary | ICD-10-CM | POA: Insufficient documentation

## 2014-05-08 DIAGNOSIS — D689 Coagulation defect, unspecified: Secondary | ICD-10-CM | POA: Insufficient documentation

## 2015-03-19 DIAGNOSIS — R197 Diarrhea, unspecified: Secondary | ICD-10-CM | POA: Insufficient documentation

## 2015-03-19 DIAGNOSIS — R52 Pain, unspecified: Secondary | ICD-10-CM | POA: Insufficient documentation

## 2015-03-19 DIAGNOSIS — L299 Pruritus, unspecified: Secondary | ICD-10-CM | POA: Insufficient documentation

## 2015-03-19 DIAGNOSIS — R509 Fever, unspecified: Secondary | ICD-10-CM | POA: Insufficient documentation

## 2015-06-01 ENCOUNTER — Other Ambulatory Visit: Payer: Self-pay

## 2015-07-16 ENCOUNTER — Emergency Department (HOSPITAL_COMMUNITY)
Admission: EM | Admit: 2015-07-16 | Discharge: 2015-07-17 | Disposition: A | Discharge status: Home / Self Care | Payer: Medicare Other | Attending: Emergency Medicine | Admitting: Emergency Medicine

## 2015-07-16 ENCOUNTER — Encounter (HOSPITAL_COMMUNITY): Payer: Self-pay | Admitting: *Deleted

## 2015-07-16 DIAGNOSIS — Z79899 Other long term (current) drug therapy: Secondary | ICD-10-CM | POA: Diagnosis not present

## 2015-07-16 DIAGNOSIS — R197 Diarrhea, unspecified: Secondary | ICD-10-CM | POA: Insufficient documentation

## 2015-07-16 DIAGNOSIS — I120 Hypertensive chronic kidney disease with stage 5 chronic kidney disease or end stage renal disease: Secondary | ICD-10-CM | POA: Insufficient documentation

## 2015-07-16 DIAGNOSIS — Z880 Allergy status to penicillin: Secondary | ICD-10-CM | POA: Diagnosis not present

## 2015-07-16 DIAGNOSIS — Z992 Dependence on renal dialysis: Secondary | ICD-10-CM | POA: Diagnosis not present

## 2015-07-16 DIAGNOSIS — N186 End stage renal disease: Secondary | ICD-10-CM | POA: Insufficient documentation

## 2015-07-16 DIAGNOSIS — E876 Hypokalemia: Secondary | ICD-10-CM | POA: Insufficient documentation

## 2015-07-16 DIAGNOSIS — R112 Nausea with vomiting, unspecified: Secondary | ICD-10-CM | POA: Insufficient documentation

## 2015-07-16 DIAGNOSIS — Z85828 Personal history of other malignant neoplasm of skin: Secondary | ICD-10-CM | POA: Diagnosis not present

## 2015-07-16 DIAGNOSIS — A047 Enterocolitis due to Clostridium difficile: Secondary | ICD-10-CM | POA: Insufficient documentation

## 2015-07-16 DIAGNOSIS — Z942 Lung transplant status: Secondary | ICD-10-CM | POA: Insufficient documentation

## 2015-07-16 DIAGNOSIS — Z7982 Long term (current) use of aspirin: Secondary | ICD-10-CM | POA: Diagnosis not present

## 2015-07-16 DIAGNOSIS — A498 Other bacterial infections of unspecified site: Secondary | ICD-10-CM

## 2015-07-16 DIAGNOSIS — Z87891 Personal history of nicotine dependence: Secondary | ICD-10-CM | POA: Insufficient documentation

## 2015-07-16 DIAGNOSIS — Z792 Long term (current) use of antibiotics: Secondary | ICD-10-CM | POA: Insufficient documentation

## 2015-07-16 LAB — I-STAT CG4 LACTIC ACID, ED: LACTIC ACID, VENOUS: 1.69 mmol/L (ref 0.5–2.0)

## 2015-07-16 MED ORDER — ONDANSETRON HCL 4 MG/2ML IJ SOLN
4.0000 mg | Freq: Once | INTRAMUSCULAR | Status: AC
  Administered 2015-07-16: 4 mg via INTRAVENOUS
  Filled 2015-07-16: qty 2

## 2015-07-16 MED ORDER — SODIUM CHLORIDE 0.9 % IV SOLN
1000.0000 mL | Freq: Once | INTRAVENOUS | Status: AC
  Administered 2015-07-16: 1000 mL via INTRAVENOUS

## 2015-07-16 MED ORDER — SODIUM CHLORIDE 0.9 % IV SOLN
1000.0000 mL | INTRAVENOUS | Status: DC
  Administered 2015-07-16: 1000 mL via INTRAVENOUS

## 2015-07-16 NOTE — ED Notes (Signed)
Patient seen at [facility] on Monday from vomiting and diarrhea (c diff)  Last vomited today  (taking flagyl)

## 2015-07-16 NOTE — ED Provider Notes (Signed)
CSN: [id number]     Arrival date & time 07/16/15  2158 History   This chart was scribed for Delora Fuel, MD by Forrestine Him, ED Scribe. This patient was seen in room A12C/A12C and the patient's care was started 11:02 PM.   Chief Complaint  Patient presents with  . Emesis   The history is provided by the patient. No language interpreter was used.    HPI Comments: Tyler Mueller is a 74 y.o. male with a PMHx of HTN and diverticulitis who presents to the Emergency Department complaining of intermittent, ongoing vomiting x 5 days. He also reports ongoing generalized weakness and diarrhea with 3-4 episodes reported today. Pt was evaluated at [facility] 3 days ago for same. At time of visit, pt was diagnosed with C. Diff and released home with prescriptions for Flagyl and Vancomycin. He denies any fever or chills at this time. Mr. Ketelsen was last dialyzed yesterday without any issues. Pt with known allergies to Morphine and Penicillins.   Past Medical History  Diagnosis Date  . Hypertension   . 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 Diverticulitis   . Chronic kidney disease     RENAL INSUFFICIENCY  . Skin cancer of face   . Muscle spasm of back 04/21/2014  . History of lung transplant 04/21/2014   Past Surgical History  Procedure Laterality Date  . Cataract extraction    . Appendectomy    . Lung transplant, double  2004   No family history on file. Social History  Substance Use Topics  . Smoking status: Former Smoker    Quit date: 04/22/2003  . Smokeless tobacco: Never Used  . Alcohol Use: No     Comment: QUIT ALCOHOL 2004    Review of Systems  Constitutional: Negative for fever and chills.  Respiratory: Negative for cough and shortness of breath.   Cardiovascular: Negative for chest pain.  Gastrointestinal: Positive for vomiting and diarrhea.  Neurological: Positive for weakness. Negative for headaches.  Psychiatric/Behavioral: Negative for confusion.  All other systems  reviewed and are negative.     Allergies  Morphine and related and Penicillins  Home Medications   Prior to Admission medications   Medication Sig Start Date End Date Taking? Authorizing Provider  acyclovir (ZOVIRAX) 200 MG capsule Take 4 capsules (800 mg total) by mouth daily. 04/27/14   Jerrye Noble, MD  amLODipine (NORVASC) 10 MG tablet Take 1 tablet (10 mg total) by mouth daily. 04/27/14   Jerrye Noble, MD  aspirin EC 81 MG tablet Take 81 mg by mouth daily.    Historical Provider, MD  calcium acetate (PHOSLO) 667 MG capsule Take 667 mg by mouth 2 (two) times daily with a meal.    Historical Provider, MD  carvedilol (COREG) 25 MG tablet Take 25 mg by mouth 2 (two) times daily with a meal.    Historical Provider, MD  doxazosin (CARDURA) 4 MG tablet Take 4 mg by mouth daily.    Historical Provider, MD  ferrous gluconate (FERGON) 225 (27 FE) MG tablet Take 240 mg by mouth 3 (three) times daily with meals.    Historical Provider, MD  glipiZIDE (GLUCOTROL) 5 MG tablet Take 5 mg by mouth daily before breakfast.    Historical Provider, MD  Multiple Vitamin (MULTIVITAMIN WITH MINERALS) TABS tablet Take 1 tablet by mouth daily.    Historical Provider, MD  pravastatin (PRAVACHOL) 40 MG tablet Take 20 mg by mouth daily.    Historical  Provider, MD  predniSONE (DELTASONE) 5 MG tablet Take 5 mg by mouth 2 (two) times daily with a meal.    Historical Provider, MD  ranitidine (ZANTAC) 150 MG tablet Take 150 mg by mouth 2 (two) times daily.    Historical Provider, MD  sodium bicarbonate 650 MG tablet Take 1,300 mg by mouth 3 (three) times daily.    Historical Provider, MD  sulfamethoxazole-trimethoprim (BACTRIM,SEPTRA) 400-80 MG per tablet Take 1 tablet by mouth 3 (three) times a week.    Historical Provider, MD  tacrolimus (PROGRAF) 0.5 MG capsule Take 0.5 mg by mouth 2 (two) times daily.    Historical Provider, MD  vancomycin (VANCOCIN) 50 mg/mL oral solution Take 2.5 mLs (125 mg total) by mouth every 6  (six) hours. 04/28/14   Jerrye Noble, MD   Triage Vitals: BP 124/52 mmHg  Pulse 88  Temp(Src) 98.1 F (36.7 C)  Resp 16  Ht 5\' 8"  (1.727 m)  Wt 147 lb (66.679 kg)  BMI 22.36 kg/m2  SpO2 98%   Physical Exam  Constitutional: He is oriented to person, place, and time. He appears well-developed and well-nourished.  HENT:  Head: Normocephalic and atraumatic.  Eyes: EOM are normal. Pupils are equal, round, and reactive to light.  Neck: Normal range of motion. Neck supple. No JVD present.  Cardiovascular: Normal rate, regular rhythm, normal heart sounds and intact distal pulses.   No murmur heard. Pulmonary/Chest: Effort normal and breath sounds normal. He has no wheezes. He has no rales. He exhibits no tenderness.  Abdominal: Soft. Bowel sounds are normal. He exhibits no distension and no mass. There is no tenderness.  Musculoskeletal: Normal range of motion. He exhibits no edema.  AV fistula present to L forearm with prominent thrill   Lymphadenopathy:    He has no cervical adenopathy.  Neurological: He is alert and oriented to person, place, and time. No cranial nerve deficit. He exhibits normal muscle tone. Coordination normal.  Skin: Skin is warm and dry. No rash noted.  Psychiatric: He has a normal mood and affect. His behavior is normal. Judgment and thought content normal.  Nursing note and vitals reviewed.   ED Course  Procedures (including critical care time)  DIAGNOSTIC STUDIES: Oxygen Saturation is 98% on RA, Normal by my interpretation.    COORDINATION OF CARE: 11:10 PM- Will give fluids and Zofran. Will order BMP, CBC, and i-stat CG4 lactic acid. Discussed treatment plan with pt at bedside and pt agreed to plan.     Labs Review Results for orders placed or performed during the hospital encounter of 0000000  Basic metabolic panel  Result Value Ref Range   Sodium 135 135 - 145 mmol/L   Potassium 3.1 (L) 3.5 - 5.1 mmol/L   Chloride 97 (L) 101 - 111 mmol/L   CO2 20  (L) 22 - 32 mmol/L   Glucose, Bld 154 (H) 65 - 99 mg/dL   BUN 50 (H) 6 - 20 mg/dL   Creatinine, Ser 8.82 (H) 0.61 - 1.24 mg/dL   Calcium 7.7 (L) 8.9 - 10.3 mg/dL   GFR calc non Af Amer 5 (L) >60 mL/min   GFR calc Af Amer 6 (L) >60 mL/min   Anion gap 18 (H) 5 - 15  CBC with Differential  Result Value Ref Range   WBC 8.9 4.0 - 10.5 K/uL   RBC 4.22 4.22 - 5.81 MIL/uL   Hemoglobin 12.9 (L) 13.0 - 17.0 g/dL   HCT 38.8 (L) 39.0 - 52.0 %  MCV 91.9 78.0 - 100.0 fL   MCH 30.6 26.0 - 34.0 pg   MCHC 33.2 30.0 - 36.0 g/dL   RDW 14.3 11.5 - 15.5 %   Platelets 233 150 - 400 K/uL   Neutrophils Relative % 67 43 - 77 %   Neutro Abs 5.9 1.7 - 7.7 K/uL   Lymphocytes Relative 18 12 - 46 %   Lymphs Abs 1.6 0.7 - 4.0 K/uL   Monocytes Relative 14 (H) 3 - 12 %   Monocytes Absolute 1.3 (H) 0.1 - 1.0 K/uL   Eosinophils Relative 1 0 - 5 %   Eosinophils Absolute 0.1 0.0 - 0.7 K/uL   Basophils Relative 0 0 - 1 %   Basophils Absolute 0.0 0.0 - 0.1 K/uL  I-Stat CG4 Lactic Acid, ED  Result Value Ref Range   Lactic Acid, Venous 1.69 0.5 - 2.0 mmol/L   I personally reviewed and evaluated these images and lab results as part of my medical decision-making.   MDM   Final diagnoses:  Nausea vomiting and diarrhea  End-stage renal disease on hemodialysis  Clostridium difficile infection  Hypokalemia    Persistent diarrhea in patient known to be positive for Clostridium difficile. Recurrent nausea and vomiting. Patient appears well. He is already in the process of obtaining vancomycin to treat his question difficile since it has failed to respond to metronidazole. Will give IV fluids and check electrolytes. He is given ondansetron for nausea.  He feels much better after above noted treatment. Laboratory workup is significant for hypokalemia which is presumably related to ongoing diarrhea. He states he does make urine. However, with end-stage renal disease, I am concerned about potassium replacement. He will  be given dose of potassium in the ED and will be given a prescription for a relatively low dose of potassium supplementation. He is to continue working with his provider at the [facility] to get the oral vancomycin for his infection.  I personally performed the services described in this documentation, which was scribed in my presence. The recorded information has been reviewed and is accurate.     Delora Fuel, MD [id number]

## 2015-07-17 DIAGNOSIS — R112 Nausea with vomiting, unspecified: Secondary | ICD-10-CM | POA: Diagnosis not present

## 2015-07-17 LAB — BASIC METABOLIC PANEL
ANION GAP: 18 — AB (ref 5–15)
BUN: 50 mg/dL — AB (ref 6–20)
CALCIUM: 7.7 mg/dL — AB (ref 8.9–10.3)
CHLORIDE: 97 mmol/L — AB (ref 101–111)
CO2: 20 mmol/L — AB (ref 22–32)
CREATININE: 8.82 mg/dL — AB (ref 0.61–1.24)
GFR calc Af Amer: 6 mL/min — ABNORMAL LOW (ref >60)
GFR calc non Af Amer: 5 mL/min — ABNORMAL LOW (ref >60)
GLUCOSE: 154 mg/dL — AB (ref 65–99)
POTASSIUM: 3.1 mmol/L — AB (ref 3.5–5.1)
SODIUM: 135 mmol/L (ref 135–145)

## 2015-07-17 LAB — CBC WITH DIFFERENTIAL/PLATELET
Basophils Absolute: 0 10*3/uL (ref 0.0–0.1)
Basophils Relative: 0 % (ref 0–1)
EOS ABS: 0.1 10*3/uL (ref 0.0–0.7)
Eosinophils Relative: 1 % (ref 0–5)
HCT: 38.8 % — ABNORMAL LOW (ref 39.0–52.0)
Hemoglobin: 12.9 g/dL — ABNORMAL LOW (ref 13.0–17.0)
Lymphocytes Relative: 18 % (ref 12–46)
Lymphs Abs: 1.6 10*3/uL (ref 0.7–4.0)
MCH: 30.6 pg (ref 26.0–34.0)
MCHC: 33.2 g/dL (ref 30.0–36.0)
MCV: 91.9 fL (ref 78.0–100.0)
MONOS PCT: 14 % — AB (ref 3–12)
Monocytes Absolute: 1.3 10*3/uL — ABNORMAL HIGH (ref 0.1–1.0)
Neutro Abs: 5.9 10*3/uL (ref 1.7–7.7)
Neutrophils Relative %: 67 % (ref 43–77)
PLATELETS: 233 10*3/uL (ref 150–400)
RBC: 4.22 MIL/uL (ref 4.22–5.81)
RDW: 14.3 % (ref 11.5–15.5)
WBC: 8.9 10*3/uL (ref 4.0–10.5)

## 2015-07-17 MED ORDER — POTASSIUM CHLORIDE CRYS ER 20 MEQ PO TBCR: 20.0000 meq | EXTENDED_RELEASE_TABLET | Freq: Every day | ORAL | Status: DC

## 2015-07-17 MED ORDER — POTASSIUM CHLORIDE CRYS ER 20 MEQ PO TBCR
40.0000 meq | EXTENDED_RELEASE_TABLET | Freq: Once | ORAL | Status: AC
  Administered 2015-07-17: 40 meq via ORAL
  Filled 2015-07-17: qty 2

## 2015-07-17 MED ORDER — ONDANSETRON HCL 4 MG PO TABS: 4.0000 mg | ORAL_TABLET | Freq: Four times a day (QID) | ORAL | Status: DC | PRN

## 2015-07-17 NOTE — Discharge Instructions (Signed)
Work with your doctor at the [facility] to get the vancomycin for your C. difficile infection. Make sure to drink a lot of fluid.  Clostridium Difficile Infection Clostridium difficile (C. difficile) is a bacteria found in the intestinal tract or colon. Under certain conditions, it causes diarrhea and sometimes severe disease. The severe form of the disease is known as pseudomembranous colitis (often called C. difficile colitis). This disease can damage the lining of the colon or cause the colon to become enlarged (toxic megacolon). CAUSES Your colon normally contains many different bacteria, including C. difficile. The balance of bacteria in your colon can change during illness. This is especially true when you take antibiotic medicine. Taking antibiotics may allow the C. difficile to grow, multiply excessively, and make a toxin that then causes illness. The elderly and people with certain medical conditions have a greater risk of getting C. difficile infections. SYMPTOMS  Watery diarrhea.  Fever.  Fatigue.  Loss of appetite.  Nausea.  Abdominal swelling, pain, or tenderness.  Dehydration. DIAGNOSIS Your symptoms may make your caregiver suspect a C. difficile infection, especially if you have used antibiotics in the preceding weeks. However, there are only 2 ways to know for certain whether you have a C. difficile infection:  A lab test that finds the toxin in your stool.  The specific appearance of an abnormality (pseudomembrane) in your colon. This can only be seen by doing a sigmoidoscopy or colonoscopy. These procedures involve passing an instrument through your rectum to look at the inside of your colon. Your caregiver will help determine if these tests are necessary. TREATMENT  Most people are successfully treated with one of two specific antibiotics, usually given by mouth. Other antibiotics you are receiving are stopped if possible.  Intravenous (IV) fluids and correction of  electrolyte imbalance may be necessary.  Rarely, surgery may be needed to remove the infected part of the intestines.  Careful hand washing by you and your caregivers is important to prevent the spread of infection. In the hospital, your caregivers may also put on gowns and gloves to prevent the spread of the C. difficile bacteria. Your room is also cleaned regularly with a solution containing bleach or a product that is known to kill C. difficile. HOME CARE INSTRUCTIONS  Drink enough fluids to keep your urine clear or pale yellow. Avoid milk, caffeine, and alcohol.  Ask your caregiver for specific rehydration instructions.  Try eating small, frequent meals rather than large meals.  Take your antibiotics as directed. Finish them even if you start to feel better.  Do not use medicines to slow diarrhea. This could delay healing or cause complications.  Wash your hands thoroughly after using the bathroom and before preparing food.  Make sure people who live with you wash their hands often, too.  Carefully disinfect all surfaces with a product that contains chlorine bleach. SEEK MEDICAL CARE IF:  Diarrhea persists longer than expected or recurs after completing your course of antibiotic treatment for the C. difficile infection.  You have trouble staying hydrated. SEEK IMMEDIATE MEDICAL CARE IF:  You develop a new fever.  You have increasing abdominal pain or tenderness.  There is blood in your stools, or your stools are dark black and tarry.  You cannot hold down food or liquids. MAKE SURE YOU:  Understand these instructions.  Will watch your condition.  Will get help right away if you are not doing well or get worse. Document Released: 08/31/2005 Document Revised: 04/07/2014 Document Reviewed: 04/29/2011  ExitCare Patient Information 2015 [place]. This information is not intended to replace advice given to you by your health care provider. Make sure you discuss any  questions you have with your health care provider.  Ondansetron tablets What is this medicine? ONDANSETRON (on DAN se tron) is used to treat nausea and vomiting caused by chemotherapy. It is also used to prevent or treat nausea and vomiting after surgery. This medicine may be used for other purposes; ask your health care provider or pharmacist if you have questions. COMMON BRAND NAME(S): Zofran What should I tell my health care provider before I take this medicine? They need to know if you have any of these conditions: -heart disease -history of irregular heartbeat -liver disease -low levels of magnesium or potassium in the blood -an unusual or allergic reaction to ondansetron, granisetron, other medicines, foods, dyes, or preservatives -pregnant or trying to get pregnant -breast-feeding How should I use this medicine? Take this medicine by mouth with a glass of water. Follow the directions on your prescription label. Take your doses at regular intervals. Do not take your medicine more often than directed. Talk to your pediatrician regarding the use of this medicine in children. Special care may be needed. Overdosage: If you think you have taken too much of this medicine contact a poison control center or emergency room at once. NOTE: This medicine is only for you. Do not share this medicine with others. What if I miss a dose? If you miss a dose, take it as soon as you can. If it is almost time for your next dose, take only that dose. Do not take double or extra doses. What may interact with this medicine? Do not take this medicine with any of the following medications: -apomorphine -certain medicines for fungal infections like fluconazole, itraconazole, ketoconazole, posaconazole, voriconazole -cisapride -dofetilide -dronedarone -pimozide -thioridazine -ziprasidone This medicine may also interact with the following medications: -carbamazepine -certain medicines for depression,  anxiety, or psychotic disturbances -fentanyl -linezolid -MAOIs like Carbex, Eldepryl, Marplan, Nardil, and Parnate -methylene blue (injected into a vein) -other medicines that prolong the QT interval (cause an abnormal heart rhythm) -phenytoin -rifampicin -tramadol This list may not describe all possible interactions. Give your health care provider a list of all the medicines, herbs, non-prescription drugs, or dietary supplements you use. Also tell them if you smoke, drink alcohol, or use illegal drugs. Some items may interact with your medicine. What should I watch for while using this medicine? Check with your doctor or health care professional right away if you have any sign of an allergic reaction. What side effects may I notice from receiving this medicine? Side effects that you should report to your doctor or health care professional as soon as possible: -allergic reactions like skin rash, itching or hives, swelling of the face, lips or tongue -breathing problems -confusion -dizziness -fast or irregular heartbeat -feeling faint or lightheaded, falls -fever and chills -loss of balance or coordination -seizures -sweating -swelling of the hands or feet -tightness in the chest -tremors -unusually weak or tired Side effects that usually do not require medical attention (report to your doctor or health care professional if they continue or are bothersome): -constipation or diarrhea -headache This list may not describe all possible side effects. Call your doctor for medical advice about side effects. You may report side effects to FDA at 1-800-FDA-1088. Where should I keep my medicine? Keep out of the reach of children. Store between 2 and 30 degrees C (36 and  86 degrees F). Throw away any unused medicine after the expiration date. NOTE: This sheet is a summary. It may not cover all possible information. If you have questions about this medicine, talk to your doctor, pharmacist, or  health care provider.  2015, Elsevier/Gold Standard. (2013-08-28 16:27:45)  Potassium Salts tablets, extended-release tablets or capsules What is this medicine? POTASSIUM (poe TASS i um) is a natural salt that is important for the heart, muscles, and nerves. It is found in many foods and is normally supplied by a well balanced diet. This medicine is used to treat low potassium. This medicine may be used for other purposes; ask your health care provider or pharmacist if you have questions. COMMON BRAND NAME(S): ED-K+10, Glu-K, K-10, K-8, K-Dur, K-Tab, Kaon-CL, Klor-Con, Klor-Con M10, Klor-Con M15, Klor-Con M20, Klotrix, Micro-K, Micro-K Extencaps, Slow-K What should I tell my health care provider before I take this medicine? They need to know if you have any of these conditions: -dehydration -diabetes -irregular heartbeat -kidney disease -stomach ulcers or other stomach problems -an unusual or allergic reaction to potassium salts, other medicines, foods, dyes, or preservatives -pregnant or trying to get pregnant -breast-feeding How should I use this medicine? Take this medicine by mouth with a full glass of water. Follow the directions on the prescription label. Take with food. Do not suck on, crush, or chew this medicine. If you have difficulty swallowing, ask the pharmacist how to take. Take your medicine at regular intervals. Do not take it more often than directed. Do not stop taking except on your doctor's advice. Talk to your pediatrician regarding the use of this medicine in children. Special care may be needed. Overdosage: If you think you have taken too much of this medicine contact a poison control center or emergency room at once. NOTE: This medicine is only for you. Do not share this medicine with others. What if I miss a dose? If you miss a dose, take it as soon as you can. If it is almost time for your next dose, take only that dose. Do not take double or extra doses. What may  interact with this medicine? Do not take this medicine with any of the following medications: -eplerenone -sodium polystyrene sulfonate This medicine may also interact with the following medications: -medicines for blood pressure or heart disease like lisinopril, losartan, quinapril, valsartan -medicines for cold or allergies -medicines for inflammation like ibuprofen, indomethacin -medicines for Parkinson's disease -medicines for the stomach like metoclopramide, dicyclomine, glycopyrrolate -some diuretics This list may not describe all possible interactions. Give your health care provider a list of all the medicines, herbs, non-prescription drugs, or dietary supplements you use. Also tell them if you smoke, drink alcohol, or use illegal drugs. Some items may interact with your medicine. What should I watch for while using this medicine? Visit your doctor or health care professional for regular check ups. You will need lab work done regularly. You may need to be on a special diet while taking this medicine. Ask your doctor. What side effects may I notice from receiving this medicine? Side effects that you should report to your doctor or health care professional as soon as possible: -allergic reactions like skin rash, itching or hives, swelling of the face, lips, or tongue -black, tarry stools -heartburn -irregular heartbeat -numbness or tingling in hands or feet -pain when swallowing -unusually weak or tired Side effects that usually do not require medical attention (report to your doctor or health care professional if they continue or  are bothersome): -diarrhea -nausea -stomach gas -vomiting This list may not describe all possible side effects. Call your doctor for medical advice about side effects. You may report side effects to FDA at 1-800-FDA-1088. Where should I keep my medicine? Keep out of the reach of children. Store at room temperature between 15 and 30 degrees C (59 and 86  degrees F ). Keep bottle closed tightly to protect this medicine from light and moisture. Throw away any unused medicine after the expiration date. NOTE: This sheet is a summary. It may not cover all possible information. If you have questions about this medicine, talk to your doctor, pharmacist, or health care provider.  2015, Elsevier/Gold Standard. (2008-02-06 11:17:31)

## 2015-07-17 NOTE — ED Notes (Signed)
MD at bedside. 

## 2016-07-29 DIAGNOSIS — Z789 Other specified health status: Secondary | ICD-10-CM | POA: Insufficient documentation

## 2016-08-10 DIAGNOSIS — E441 Mild protein-calorie malnutrition: Secondary | ICD-10-CM | POA: Insufficient documentation

## 2017-03-29 DIAGNOSIS — Z8709 Personal history of other diseases of the respiratory system: Secondary | ICD-10-CM | POA: Insufficient documentation

## 2017-06-11 DIAGNOSIS — R059 Cough, unspecified: Secondary | ICD-10-CM | POA: Insufficient documentation

## 2017-08-15 ENCOUNTER — Telehealth (HOSPITAL_COMMUNITY): Payer: Self-pay | Admitting: *Deleted

## 2017-08-15 NOTE — Telephone Encounter (Signed)
Received referral from [facility]  for pt to attend cardiac rehab.  Checked in care everywhere for information regarding his cardiac procedure. Last records in care everywhere are 2017.  Called to verify with pt what he had done.  Pt stated that he had 4 stents placed at [facility].  Asked pt who he was going to follow up with in the office.  Pt indicated that he was not planning to follow up with anyone at [facility].  He stated he might get in with a [facility].  Pt indicated that wasn't for sure if he would come he already is exercising at a gym. Offered contact information pt indicated the number popped up on caller id.  Pt will let us know if he wish to proceed. Cherre Huger, BSN Cardiac and Training and development officer

## 2018-02-16 DIAGNOSIS — Z2839 Other underimmunization status: Secondary | ICD-10-CM | POA: Insufficient documentation

## 2019-05-20 DIAGNOSIS — K130 Diseases of lips: Secondary | ICD-10-CM | POA: Insufficient documentation

## 2019-06-01 DIAGNOSIS — J189 Pneumonia, unspecified organism: Secondary | ICD-10-CM | POA: Insufficient documentation

## 2019-06-01 DIAGNOSIS — E8770 Fluid overload, unspecified: Secondary | ICD-10-CM | POA: Insufficient documentation

## 2019-06-18 ENCOUNTER — Other Ambulatory Visit (HOSPITAL_COMMUNITY): Payer: Self-pay | Admitting: Sports Medicine

## 2019-06-18 ENCOUNTER — Other Ambulatory Visit: Payer: Self-pay

## 2019-06-18 ENCOUNTER — Ambulatory Visit (HOSPITAL_COMMUNITY)
Admission: RE | Admit: 2019-06-18 | Discharge: 2019-06-18 | Disposition: A | Discharge status: Home / Self Care | Payer: Medicare Other | Source: Ambulatory Visit | Attending: Sports Medicine | Admitting: Sports Medicine

## 2019-06-18 DIAGNOSIS — M7989 Other specified soft tissue disorders: Secondary | ICD-10-CM | POA: Diagnosis present

## 2019-06-18 DIAGNOSIS — M79605 Pain in left leg: Secondary | ICD-10-CM

## 2019-06-18 NOTE — Progress Notes (Signed)
Lower extremity venous has been completed.   Preliminary results in CV Proc.   Abram Sander 06/18/2019 4:31 PM

## 2019-06-21 DIAGNOSIS — Z91199 Patient's noncompliance with other medical treatment and regimen due to unspecified reason: Secondary | ICD-10-CM | POA: Insufficient documentation

## 2019-07-18 DIAGNOSIS — C148 Malignant neoplasm of overlapping sites of lip, oral cavity and pharynx: Secondary | ICD-10-CM | POA: Insufficient documentation

## 2019-08-14 DIAGNOSIS — I351 Nonrheumatic aortic (valve) insufficiency: Secondary | ICD-10-CM | POA: Insufficient documentation

## 2020-02-06 DIAGNOSIS — K922 Gastrointestinal hemorrhage, unspecified: Secondary | ICD-10-CM | POA: Insufficient documentation

## 2020-02-06 DIAGNOSIS — D631 Anemia in chronic kidney disease: Secondary | ICD-10-CM | POA: Insufficient documentation

## 2020-03-05 MED ORDER — ASPIRIN 81 MG PO TBEC
81.00 | DELAYED_RELEASE_TABLET | ORAL | Status: DC
Start: 2020-03-05 — End: 2020-03-05

## 2020-03-05 MED ORDER — ACETAMINOPHEN 325 MG PO TABS
650.00 | ORAL_TABLET | ORAL | Status: DC
Start: ? — End: 2020-03-05

## 2020-03-05 MED ORDER — SULFAMETHOXAZOLE-TRIMETHOPRIM 400-80 MG PO TABS
1.00 | ORAL_TABLET | ORAL | Status: DC
Start: 2020-03-05 — End: 2020-03-05

## 2020-03-05 MED ORDER — PREDNISONE 5 MG PO TABS
5.00 | ORAL_TABLET | ORAL | Status: DC
Start: 2020-03-05 — End: 2020-03-05

## 2020-03-05 MED ORDER — SENNOSIDES-DOCUSATE SODIUM 8.6-50 MG PO TABS
2.00 | ORAL_TABLET | ORAL | Status: DC
Start: ? — End: 2020-03-05

## 2020-03-05 MED ORDER — EPOETIN ALFA-EPBX 10000 UNIT/ML IJ SOLN
10000.00 | INTRAMUSCULAR | Status: DC
Start: ? — End: 2020-03-05

## 2020-03-05 MED ORDER — FERROUS SULFATE 324 MG PO TBEC
324.00 | DELAYED_RELEASE_TABLET | ORAL | Status: DC
Start: 2020-03-05 — End: 2020-03-05

## 2020-03-05 MED ORDER — TACROLIMUS 1 MG PO CAPS
1.00 | ORAL_CAPSULE | ORAL | Status: DC
Start: 2020-03-05 — End: 2020-03-05

## 2020-03-05 MED ORDER — GENERIC EXTERNAL MEDICATION
Status: DC
Start: ? — End: 2020-03-05

## 2020-03-05 MED ORDER — HEPARIN SODIUM (PORCINE) 5000 UNIT/ML IJ SOLN
5000.00 | INTRAMUSCULAR | Status: DC
Start: 2020-03-04 — End: 2020-03-05

## 2020-03-05 MED ORDER — FAMOTIDINE 20 MG PO TABS
10.00 | ORAL_TABLET | ORAL | Status: DC
Start: 2020-03-05 — End: 2020-03-05

## 2020-03-05 MED ORDER — DEXTROSE 50 % IV SOLN
12.50 | INTRAVENOUS | Status: DC
Start: ? — End: 2020-03-05

## 2020-03-05 MED ORDER — LANTHANUM CARBONATE 500 MG PO CHEW
1000.00 | CHEWABLE_TABLET | ORAL | Status: DC
Start: 2020-03-04 — End: 2020-03-05

## 2020-03-05 MED ORDER — GABAPENTIN 300 MG PO CAPS
300.00 | ORAL_CAPSULE | ORAL | Status: DC
Start: 2020-03-04 — End: 2020-03-05

## 2020-03-05 MED ORDER — LIDOCAINE-PRILOCAINE 2.5-2.5 % EX CREA
TOPICAL_CREAM | CUTANEOUS | Status: DC
Start: ? — End: 2020-03-05

## 2020-03-05 MED ORDER — INSULIN LISPRO 100 UNIT/ML ~~LOC~~ SOLN
0.00 | SUBCUTANEOUS | Status: DC
Start: 2020-03-04 — End: 2020-03-05

## 2020-03-05 MED ORDER — HYDRALAZINE HCL 20 MG/ML IJ SOLN
10.00 | INTRAMUSCULAR | Status: DC
Start: ? — End: 2020-03-05

## 2020-03-05 MED ORDER — PRAVASTATIN SODIUM 20 MG PO TABS
20.00 | ORAL_TABLET | ORAL | Status: DC
Start: 2020-03-04 — End: 2020-03-05

## 2020-03-05 MED ORDER — TACROLIMUS 0.5 MG PO CAPS
0.50 | ORAL_CAPSULE | ORAL | Status: DC
Start: 2020-03-04 — End: 2020-03-05

## 2020-03-05 MED ORDER — ONDANSETRON HCL 4 MG/2ML IJ SOLN
4.00 | INTRAMUSCULAR | Status: DC
Start: ? — End: 2020-03-05

## 2020-03-05 MED ORDER — LIDOCAINE HCL 1 % IJ SOLN
0.50 | INTRAMUSCULAR | Status: DC
Start: ? — End: 2020-03-05

## 2020-03-05 MED ORDER — GLUCAGON (RDNA) 1 MG IJ KIT
1.00 | PACK | INTRAMUSCULAR | Status: DC
Start: ? — End: 2020-03-05

## 2020-03-05 MED ORDER — CARVEDILOL 25 MG PO TABS
25.00 | ORAL_TABLET | ORAL | Status: DC
Start: 2020-03-04 — End: 2020-03-05

## 2020-03-05 MED ORDER — AZITHROMYCIN 250 MG PO TABS
250.00 | ORAL_TABLET | ORAL | Status: DC
Start: 2020-03-06 — End: 2020-03-05

## 2020-03-05 MED ORDER — INSULIN GLARGINE 100 UNIT/ML ~~LOC~~ SOLN
5.00 | SUBCUTANEOUS | Status: DC
Start: 2020-03-04 — End: 2020-03-05

## 2020-03-05 MED ORDER — LIDOCAINE 4 % EX CREA
TOPICAL_CREAM | CUTANEOUS | Status: DC
Start: ? — End: 2020-03-05

## 2020-03-05 MED ORDER — PARICALCITOL 5 MCG/ML IV SOLN
4.00 | INTRAVENOUS | Status: DC
Start: ? — End: 2020-03-05

## 2020-03-05 MED ORDER — CALCIUM CARBONATE-VITAMIN D 600-400 MG-UNIT PO TABS
1.00 | ORAL_TABLET | ORAL | Status: DC
Start: 2020-03-05 — End: 2020-03-05

## 2020-04-10 DIAGNOSIS — K635 Polyp of colon: Secondary | ICD-10-CM | POA: Insufficient documentation

## 2020-04-10 DIAGNOSIS — K5730 Diverticulosis of large intestine without perforation or abscess without bleeding: Secondary | ICD-10-CM | POA: Insufficient documentation

## 2020-04-19 DIAGNOSIS — I469 Cardiac arrest, cause unspecified: Secondary | ICD-10-CM | POA: Insufficient documentation

## 2020-04-20 MED ORDER — GENERIC EXTERNAL MEDICATION
2.00 | Status: DC
Start: 2020-04-20 — End: 2020-04-20

## 2020-04-20 MED ORDER — SULFAMETHOXAZOLE-TRIMETHOPRIM 400-80 MG PO TABS
1.00 | ORAL_TABLET | ORAL | Status: DC
Start: 2020-04-21 — End: 2020-04-20

## 2020-04-20 MED ORDER — ASPIRIN 81 MG PO TBEC
81.00 | DELAYED_RELEASE_TABLET | ORAL | Status: DC
Start: 2020-04-21 — End: 2020-04-20

## 2020-04-20 MED ORDER — LIDOCAINE 5 % EX PTCH
2.00 | MEDICATED_PATCH | CUTANEOUS | Status: DC
Start: 2020-04-20 — End: 2020-04-20

## 2020-04-20 MED ORDER — AZITHROMYCIN 250 MG PO TABS
250.00 | ORAL_TABLET | ORAL | Status: DC
Start: 2020-04-22 — End: 2020-04-20

## 2020-04-20 MED ORDER — SENNOSIDES-DOCUSATE SODIUM 8.6-50 MG PO TABS
2.00 | ORAL_TABLET | ORAL | Status: DC
Start: 2020-04-20 — End: 2020-04-20

## 2020-04-20 MED ORDER — GLUCAGON (RDNA) 1 MG IJ KIT
1.00 | PACK | INTRAMUSCULAR | Status: DC
Start: ? — End: 2020-04-20

## 2020-04-20 MED ORDER — FERROUS SULFATE 324 MG PO TBEC
324.00 | DELAYED_RELEASE_TABLET | ORAL | Status: DC
Start: 2020-04-20 — End: 2020-04-20

## 2020-04-20 MED ORDER — ACETAMINOPHEN 325 MG PO TABS
650.00 | ORAL_TABLET | ORAL | Status: DC
Start: ? — End: 2020-04-20

## 2020-04-20 MED ORDER — INSULIN LISPRO 100 UNIT/ML ~~LOC~~ SOLN
0.00 | SUBCUTANEOUS | Status: DC
Start: 2020-04-20 — End: 2020-04-20

## 2020-04-20 MED ORDER — POLYETHYLENE GLYCOL 3350 17 GM/SCOOP PO POWD
17.00 | ORAL | Status: DC
Start: ? — End: 2020-04-20

## 2020-04-20 MED ORDER — TACROLIMUS 0.5 MG PO CAPS
0.50 | ORAL_CAPSULE | ORAL | Status: DC
Start: 2020-04-20 — End: 2020-04-20

## 2020-04-20 MED ORDER — PRAVASTATIN SODIUM 20 MG PO TABS
20.00 | ORAL_TABLET | ORAL | Status: DC
Start: 2020-04-20 — End: 2020-04-20

## 2020-04-20 MED ORDER — OXYCODONE HCL 5 MG PO TABS
5.00 | ORAL_TABLET | ORAL | Status: DC
Start: ? — End: 2020-04-20

## 2020-04-20 MED ORDER — TACROLIMUS 1 MG PO CAPS
1.00 | ORAL_CAPSULE | ORAL | Status: DC
Start: 2020-04-21 — End: 2020-04-20

## 2020-04-20 MED ORDER — PREDNISONE 5 MG PO TABS
5.00 | ORAL_TABLET | ORAL | Status: DC
Start: 2020-04-21 — End: 2020-04-20

## 2020-04-20 MED ORDER — GENERIC EXTERNAL MEDICATION
Status: DC
Start: ? — End: 2020-04-20

## 2020-04-20 MED ORDER — LIDOCAINE HCL 1 % IJ SOLN
0.50 | INTRAMUSCULAR | Status: DC
Start: ? — End: 2020-04-20

## 2020-04-20 MED ORDER — DEXTROSE 50 % IV SOLN
12.50 | INTRAVENOUS | Status: DC
Start: ? — End: 2020-04-20

## 2020-04-20 MED ORDER — ONDANSETRON HCL 4 MG/2ML IJ SOLN
4.00 | INTRAMUSCULAR | Status: DC
Start: ? — End: 2020-04-20

## 2020-04-20 MED ORDER — FAMOTIDINE 20 MG PO TABS
20.00 | ORAL_TABLET | ORAL | Status: DC
Start: 2020-04-21 — End: 2020-04-20

## 2020-04-20 MED ORDER — CALCIUM CARBONATE-VITAMIN D 600-400 MG-UNIT PO TABS
1.00 | ORAL_TABLET | ORAL | Status: DC
Start: 2020-04-20 — End: 2020-04-20

## 2020-04-20 MED ORDER — LANTHANUM CARBONATE 500 MG PO CHEW
1500.00 | CHEWABLE_TABLET | ORAL | Status: DC
Start: 2020-04-20 — End: 2020-04-20

## 2020-04-20 MED ORDER — GABAPENTIN 300 MG PO CAPS
300.00 | ORAL_CAPSULE | ORAL | Status: DC
Start: 2020-04-20 — End: 2020-04-20

## 2020-04-20 MED ORDER — EPOETIN ALFA-EPBX 3000 UNIT/ML IJ SOLN
6000.00 | INTRAMUSCULAR | Status: DC
Start: ? — End: 2020-04-20

## 2020-04-23 DIAGNOSIS — K921 Melena: Secondary | ICD-10-CM | POA: Insufficient documentation

## 2020-05-02 DIAGNOSIS — Z8719 Personal history of other diseases of the digestive system: Secondary | ICD-10-CM | POA: Insufficient documentation

## 2020-07-07 DIAGNOSIS — E8779 Other fluid overload: Secondary | ICD-10-CM | POA: Insufficient documentation

## 2020-08-13 DIAGNOSIS — T7840XD Allergy, unspecified, subsequent encounter: Secondary | ICD-10-CM | POA: Insufficient documentation

## 2020-08-13 DIAGNOSIS — T782XXD Anaphylactic shock, unspecified, subsequent encounter: Secondary | ICD-10-CM | POA: Insufficient documentation

## 2021-02-05 DIAGNOSIS — J1282 Pneumonia due to coronavirus disease 2019: Secondary | ICD-10-CM | POA: Insufficient documentation

## 2021-02-22 DIAGNOSIS — R0600 Dyspnea, unspecified: Secondary | ICD-10-CM | POA: Insufficient documentation

## 2021-05-18 DIAGNOSIS — R5381 Other malaise: Secondary | ICD-10-CM | POA: Insufficient documentation

## 2021-06-10 ENCOUNTER — Encounter (HOSPITAL_COMMUNITY): Payer: Self-pay | Admitting: *Deleted

## 2021-06-10 ENCOUNTER — Emergency Department (HOSPITAL_COMMUNITY)
Admission: EM | Admit: 2021-06-10 | Discharge: 2021-06-10 | Disposition: A | Discharge status: Home / Self Care | Payer: Medicare Other | Attending: Emergency Medicine | Admitting: Emergency Medicine

## 2021-06-10 ENCOUNTER — Emergency Department (HOSPITAL_COMMUNITY): Payer: Medicare Other

## 2021-06-10 ENCOUNTER — Other Ambulatory Visit: Payer: Self-pay

## 2021-06-10 DIAGNOSIS — Z85828 Personal history of other malignant neoplasm of skin: Secondary | ICD-10-CM | POA: Diagnosis not present

## 2021-06-10 DIAGNOSIS — Z87891 Personal history of nicotine dependence: Secondary | ICD-10-CM | POA: Diagnosis not present

## 2021-06-10 DIAGNOSIS — Z7982 Long term (current) use of aspirin: Secondary | ICD-10-CM | POA: Diagnosis not present

## 2021-06-10 DIAGNOSIS — Z992 Dependence on renal dialysis: Secondary | ICD-10-CM | POA: Insufficient documentation

## 2021-06-10 DIAGNOSIS — I120 Hypertensive chronic kidney disease with stage 5 chronic kidney disease or end stage renal disease: Secondary | ICD-10-CM | POA: Diagnosis not present

## 2021-06-10 DIAGNOSIS — Z79899 Other long term (current) drug therapy: Secondary | ICD-10-CM | POA: Diagnosis not present

## 2021-06-10 DIAGNOSIS — N186 End stage renal disease: Secondary | ICD-10-CM | POA: Diagnosis not present

## 2021-06-10 DIAGNOSIS — Z20822 Contact with and (suspected) exposure to covid-19: Secondary | ICD-10-CM | POA: Insufficient documentation

## 2021-06-10 DIAGNOSIS — R296 Repeated falls: Secondary | ICD-10-CM

## 2021-06-10 DIAGNOSIS — R410 Disorientation, unspecified: Secondary | ICD-10-CM | POA: Diagnosis not present

## 2021-06-10 DIAGNOSIS — R531 Weakness: Secondary | ICD-10-CM | POA: Diagnosis not present

## 2021-06-10 LAB — CBC WITH DIFFERENTIAL/PLATELET
Abs Immature Granulocytes: 0.15 10*3/uL — ABNORMAL HIGH (ref 0.00–0.07)
Basophils Absolute: 0 10*3/uL (ref 0.0–0.1)
Basophils Relative: 1 %
Eosinophils Absolute: 0.2 10*3/uL (ref 0.0–0.5)
Eosinophils Relative: 2 %
HCT: 36.1 % — ABNORMAL LOW (ref 39.0–52.0)
Hemoglobin: 10.6 g/dL — ABNORMAL LOW (ref 13.0–17.0)
Immature Granulocytes: 2 %
Lymphocytes Relative: 10 %
Lymphs Abs: 0.9 10*3/uL (ref 0.7–4.0)
MCH: 26.3 pg (ref 26.0–34.0)
MCHC: 29.4 g/dL — ABNORMAL LOW (ref 30.0–36.0)
MCV: 89.6 fL (ref 80.0–100.0)
Monocytes Absolute: 1.2 10*3/uL — ABNORMAL HIGH (ref 0.1–1.0)
Monocytes Relative: 14 %
Neutro Abs: 6.4 10*3/uL (ref 1.7–7.7)
Neutrophils Relative %: 71 %
Platelets: 186 10*3/uL (ref 150–400)
RBC: 4.03 MIL/uL — ABNORMAL LOW (ref 4.22–5.81)
RDW: 18.4 % — ABNORMAL HIGH (ref 11.5–15.5)
WBC: 8.9 10*3/uL (ref 4.0–10.5)
nRBC: 0.2 % (ref 0.0–0.2)

## 2021-06-10 LAB — COMPREHENSIVE METABOLIC PANEL
ALT: 21 U/L (ref 0–44)
AST: 28 U/L (ref 15–41)
Albumin: 3.6 g/dL (ref 3.5–5.0)
Alkaline Phosphatase: 73 U/L (ref 38–126)
Anion gap: 12 (ref 5–15)
BUN: 18 mg/dL (ref 8–23)
CO2: 27 mmol/L (ref 22–32)
Calcium: 8.5 mg/dL — ABNORMAL LOW (ref 8.9–10.3)
Chloride: 96 mmol/L — ABNORMAL LOW (ref 98–111)
Creatinine, Ser: 3.49 mg/dL — ABNORMAL HIGH (ref 0.61–1.24)
GFR, Estimated: 17 mL/min — ABNORMAL LOW (ref >60)
Glucose, Bld: 121 mg/dL — ABNORMAL HIGH (ref 70–99)
Potassium: 4.3 mmol/L (ref 3.5–5.1)
Sodium: 135 mmol/L (ref 135–145)
Total Bilirubin: 0.7 mg/dL (ref 0.3–1.2)
Total Protein: 7 g/dL (ref 6.5–8.1)

## 2021-06-10 LAB — I-STAT VENOUS BLOOD GAS, ED
Acid-Base Excess: 6 mmol/L — ABNORMAL HIGH (ref 0.0–2.0)
Bicarbonate: 30.2 mmol/L — ABNORMAL HIGH (ref 20.0–28.0)
Calcium, Ion: 1.02 mmol/L — ABNORMAL LOW (ref 1.15–1.40)
HCT: 36 % — ABNORMAL LOW (ref 39.0–52.0)
Hemoglobin: 12.2 g/dL — ABNORMAL LOW (ref 13.0–17.0)
O2 Saturation: 65 %
Potassium: 4.2 mmol/L (ref 3.5–5.1)
Sodium: 136 mmol/L (ref 135–145)
TCO2: 31 mmol/L (ref 22–32)
pCO2, Ven: 42.2 mmHg — ABNORMAL LOW (ref 44.0–60.0)
pH, Ven: 7.462 — ABNORMAL HIGH (ref 7.250–7.430)
pO2, Ven: 32 mmHg (ref 32.0–45.0)

## 2021-06-10 LAB — RESP PANEL BY RT-PCR (FLU A&B, COVID) ARPGX2
Influenza A by PCR: NEGATIVE
Influenza B by PCR: NEGATIVE
SARS Coronavirus 2 by RT PCR: NEGATIVE

## 2021-06-10 LAB — AMMONIA: Ammonia: 20 umol/L (ref 9–35)

## 2021-06-10 LAB — MAGNESIUM: Magnesium: 2.1 mg/dL (ref 1.7–2.4)

## 2021-06-10 LAB — TROPONIN I (HIGH SENSITIVITY): Troponin I (High Sensitivity): 50 ng/L — ABNORMAL HIGH (ref <18)

## 2021-06-10 NOTE — Discharge Instructions (Addendum)
Your MRI is normal Your testing has not shown any specific abnormalities ER for worsening symptoms.  Thank you for letting us take care of you today!  Please obtain all of your results from medical records or have your doctors office obtain the results - share them with your doctor - you should be seen at your doctors office in the next 2 days. Call today to arrange your follow up. Take the medications as prescribed. Please review all of the medicines and only take them if you do not have an allergy to them. Please be aware that if you are taking birth control pills, taking other prescriptions, ESPECIALLY ANTIBIOTICS may make the birth control ineffective - if this is the case, either do not engage in sexual activity or use alternative methods of birth control such as condoms until you have finished the medicine and your family doctor says it is OK to restart them. If you are on a blood thinner such as COUMADIN, be aware that any other medicine that you take may cause the coumadin to either work too much, or not enough - you should have your coumadin level rechecked in next 7 days if this is the case.  ?  It is also a possibility that you have an allergic reaction to any of the medicines that you have been prescribed - Everybody reacts differently to medications and while MOST people have no trouble with most medicines, you may have a reaction such as nausea, vomiting, rash, swelling, shortness of breath. If this is the case, please stop taking the medicine immediately and contact your physician.   If you were given a medication in the ED such as percocet, vicodin, or morphine, be aware that these medicines are sedating and may change your ability to take care of yourself adequately for several hours after being given this medicines - you should not drive or take care of small children if you were given this medicine in the Emergency Department or if you have been prescribed these types of medicines. ?    You should return to the ER IMMEDIATELY if you develop severe or worsening symptoms.

## 2021-06-10 NOTE — ED Notes (Signed)
Pt refused 2nd troponin lab draw. Wants to go home.

## 2021-06-10 NOTE — ED Notes (Signed)
Patient transported to CT 

## 2021-06-10 NOTE — ED Notes (Signed)
Lab results was given to EDP. 

## 2021-06-10 NOTE — ED Notes (Signed)
Pt remains in MRI 

## 2021-06-10 NOTE — ED Provider Notes (Signed)
[facility] EMERGENCY DEPARTMENT Provider Note   CSN: [id number] Arrival date & time: 06/10/21  1137     History Chief Complaint  Patient presents with   Altered Mental Status   Weakness   Fall    Tyler Mueller is a 80 y.o. male.  Patient with history of bilateral lung transplant on tacrolimus, prednisone, end-stage renal disease on hemodialysis (Tuesday, Thursday, Saturday), diabetes --presents to the emergency department for evaluation of confusion and generalized weakness.  Most of the history is taken from the patient's wife Tyler Mueller who was reached by telephone at [phone].  Patient has not been acting like himself since yesterday.  Patient had a therapy appointment but forgot about it and went fishing with his grandson.  Tyler Mueller went to get him and at that point had difficulty walking, specifically getting up an incline.  She states that he fell and had to pull himself up using the door of a car.  He was confused very early this morning.  He kept resetting a clock incorrectly and around 2 AM got up and fell trying to get dressed.  Family had great difficulty helping him get up.  He did go to dialysis today, driving himself.  When he got home he had difficulty getting into the house and was helped by his grandson.  He slumped into a chair, did not fall, but could not get up.  This prompted EMS visit and transport to the ED today.  Patient's wife reported that a couple of weeks ago he had difficulty temporarily holding on to food and drink.  This resolved and she has not noticed again.  She also reports 3 days ago he had diarrhea "all day" that is now resolved.  No reported symptoms of infection.  She does not think that he hit his head during any recent falls.  Triage note made mention of questionable left facial droop, however this was not mentioned by the patient's family.  He is not anticoagulated.      Past Medical History:  Diagnosis Date   Chronic Mueller disease     RENAL INSUFFICIENCY   Diverticulitis    History of lung transplant ([place]) 04/21/2014   Hypertension    Lung transplanted ([place]) 2004   BILATERAL    Muscle spasm of back 04/21/2014   Skin cancer of face     Patient Active Problem List   Diagnosis Date Noted   Clostridium difficile infection 04/27/2014   Herpes zoster 04/23/2014   Muscle spasm of back 04/21/2014   History of lung transplant ([place]) 04/21/2014   Diverticulitis 04/20/2014   CKD (chronic Mueller disease) stage 5, GFR less than 15 ml/min ([place]) 04/20/2014   DIABETES MELLITUS 01/04/2008   DYSLIPIDEMIA 01/04/2008   IMMUNOCOMPROMISED 01/04/2008   BRONCHIECTASIS 01/04/2008   DYSPNEA ON EXERTION 21/30/8657   Complications of transplanted lung 12/10/2007    Past Surgical History:  Procedure Laterality Date   APPENDECTOMY     CATARACT EXTRACTION     LUNG TRANSPLANT, DOUBLE  2004       No family history on file.  Social History   Tobacco Use   Smoking status: Former    Pack years: 0.00    Types: Cigarettes    Quit date: 04/22/2003    Years since quitting: 18.1   Smokeless tobacco: Never  Substance Use Topics   Alcohol use: No    Comment: QUIT ALCOHOL 2004   Drug use: No    Home Medications Prior to Admission  medications   Medication Sig Start Date End Date Taking? Authorizing Provider  acyclovir (ZOVIRAX) 200 MG capsule Take 4 capsules (800 mg total) by mouth daily. 04/27/14   Jerrye Noble, MD  amLODipine (NORVASC) 10 MG tablet Take 1 tablet (10 mg total) by mouth daily. 04/27/14   Jerrye Noble, MD  aspirin EC 81 MG tablet Take 81 mg by mouth daily.    [provider]  calcium acetate (PHOSLO) 667 MG capsule Take 667 mg by mouth 2 (two) times daily with a meal.    [provider]  carvedilol (COREG) 25 MG tablet Take 25 mg by mouth 2 (two) times daily with a meal.    [provider]  doxazosin (CARDURA) 4 MG tablet Take 4 mg by mouth daily.    [provider]  ferrous  gluconate (FERGON) 225 (27 FE) MG tablet Take 240 mg by mouth 3 (three) times daily with meals.    [provider]  glipiZIDE (GLUCOTROL) 5 MG tablet Take 5 mg by mouth daily before breakfast.    [provider]  Multiple Vitamin (MULTIVITAMIN WITH MINERALS) TABS tablet Take 1 tablet by mouth daily.    [provider]  ondansetron (ZOFRAN) 4 MG tablet Take 1 tablet (4 mg total) by mouth every 6 (six) hours as needed for nausea or vomiting. 02/26/39   Delora Fuel, MD  potassium chloride SA (K-DUR,KLOR-CON) 20 MEQ tablet Take 1 tablet (20 mEq total) by mouth daily. 12/06/70   Delora Fuel, MD  pravastatin (PRAVACHOL) 40 MG tablet Take 20 mg by mouth daily.    [provider]  predniSONE (DELTASONE) 5 MG tablet Take 5 mg by mouth 2 (two) times daily with a meal.    [provider]  ranitidine (ZANTAC) 150 MG tablet Take 150 mg by mouth 2 (two) times daily.    [provider]  sodium bicarbonate 650 MG tablet Take 1,300 mg by mouth 3 (three) times daily.    [provider]  sulfamethoxazole-trimethoprim (BACTRIM,SEPTRA) 400-80 MG per tablet Take 1 tablet by mouth 3 (three) times a week.    [provider]  tacrolimus (PROGRAF) 0.5 MG capsule Take 0.5 mg by mouth 2 (two) times daily.    [provider]  vancomycin (VANCOCIN) 50 mg/mL oral solution Take 2.5 mLs (125 mg total) by mouth every 6 (six) hours. 04/28/14   Jerrye Noble, MD    Allergies    Morphine and related and Penicillins  Review of Systems   Review of Systems  Constitutional:  Positive for fatigue. Negative for fever.  HENT:  Negative for rhinorrhea and sore throat.   Eyes:  Negative for redness.  Respiratory:  Negative for cough.   Cardiovascular:  Negative for chest pain.  Gastrointestinal:  Negative for abdominal pain, diarrhea (resolved), nausea and vomiting.  Genitourinary:  Negative for dysuria and hematuria.  Musculoskeletal:  Negative for  myalgias.  Skin:  Negative for rash.  Neurological:  Positive for weakness. Negative for headaches.   Physical Exam Updated Vital Signs BP (!) 153/114 (BP Location: Right Arm)   Pulse 94   Temp 98.3 F (36.8 C) (Oral)   Resp 18   SpO2 97%   Physical Exam Vitals and nursing note reviewed.  Constitutional:      General: He is not in acute distress.    Appearance: He is well-developed.  HENT:     Head: Normocephalic and atraumatic.     Right Ear: External ear normal.  Left Ear: External ear normal.     Nose: Nose normal.     Mouth/Throat:     Mouth: Mucous membranes are moist.  Eyes:     General:        Right eye: No discharge.        Left eye: No discharge.     Conjunctiva/sclera: Conjunctivae normal.  Cardiovascular:     Rate and Rhythm: Normal rate and regular rhythm.     Heart sounds: Normal heart sounds.  Pulmonary:     Effort: Pulmonary effort is normal.     Breath sounds: Normal breath sounds.  Abdominal:     Palpations: Abdomen is soft.     Tenderness: There is no abdominal tenderness.  Musculoskeletal:     Cervical back: Normal range of motion and neck supple.  Skin:    General: Skin is warm and dry.  Neurological:     Mental Status: He is alert.    ED Results / Procedures / Treatments   Labs (all labs ordered are listed, but only abnormal results are displayed) Labs Reviewed  CBC WITH DIFFERENTIAL/PLATELET - Abnormal; Notable for the following components:      Result Value   RBC 4.03 (*)    Hemoglobin 10.6 (*)    HCT 36.1 (*)    MCHC 29.4 (*)    RDW 18.4 (*)    Monocytes Absolute 1.2 (*)    Abs Immature Granulocytes 0.15 (*)    All other components within normal limits  COMPREHENSIVE METABOLIC PANEL - Abnormal; Notable for the following components:   Chloride 96 (*)    Glucose, Bld 121 (*)    Creatinine, Ser 3.49 (*)    Calcium 8.5 (*)    GFR, Estimated 17 (*)    All other components within normal limits  I-STAT VENOUS BLOOD GAS, ED -  Abnormal; Notable for the following components:   pH, Ven 7.462 (*)    pCO2, Ven 42.2 (*)    Bicarbonate 30.2 (*)    Acid-Base Excess 6.0 (*)    Calcium, Ion 1.02 (*)    HCT 36.0 (*)    Hemoglobin 12.2 (*)    All other components within normal limits  TROPONIN I (HIGH SENSITIVITY) - Abnormal; Notable for the following components:   Troponin I (High Sensitivity) 50 (*)    All other components within normal limits  RESP PANEL BY RT-PCR (FLU A&B, COVID) ARPGX2  AMMONIA  MAGNESIUM  TACROLIMUS LEVEL    EKG None  Radiology No results found.  Procedures Procedures   Medications Ordered in ED Medications - No data to display  ED Course  I have reviewed the triage vital signs and the nursing notes.  Pertinent labs & imaging results that were available during my care of the patient were reviewed by me and considered in my medical decision making (see chart for details).  Patient seen and examined. Work-up initiated.  Patient gives a paucity of details regarding complaint relayed by EMS.  I was able to get a hold of the patient's wife.  Please note that one of the numbers in epic is incorrect.  History as noted in HPI.  Vital signs reviewed and are as follows: BP (!) 153/114 (BP Location: Right Arm)   Pulse 94   Temp 98.3 F (36.8 C) (Oral)   Resp 18   SpO2 97%   Patient discussed with and seen earlier by Dr. Roslynn Amble.  Patient seems to be back close to baseline.  He states  that he wants to go home.  Signout to Dr. Sabra Heck at shift change.  Neurology recommendations obtained, will need to obtain MRI.  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  MDM Rules/Calculators/A&P                          Pending completion of eval.    Final Clinical Impression(s) / ED Diagnoses Final diagnoses:  Generalized weakness  Multiple falls    Rx / DC Orders ED Discharge Orders     None        Carlisle Cater, PA-C 06/10/21 1533    Lucrezia Starch, MD 06/18/21 1113

## 2021-06-10 NOTE — ED Triage Notes (Signed)
Pt is from home and having increased falls, bilateral leg weakness, and questioned left facial droop. Pt is a bilateral lung transplant and on HD with fistula to left forearm.  Pt knows self, year, place, but not sure birthday.  Pt is diabetic and cbg for EMS was 142. Bp 126/62.  Pt had HD this am (t,th.sat).  Pt denies any pain at present.  Pt states he fell this am but do not see blood thinners on list

## 2021-06-10 NOTE — ED Notes (Signed)
Patient transported to MRI 

## 2021-06-12 LAB — TACROLIMUS LEVEL: Tacrolimus (FK506) - LabCorp: 3.3 ng/mL (ref 2.0–20.0)

## 2021-09-06 DIAGNOSIS — R0902 Hypoxemia: Secondary | ICD-10-CM | POA: Insufficient documentation

## 2021-09-06 DIAGNOSIS — R051 Acute cough: Secondary | ICD-10-CM | POA: Insufficient documentation

## 2021-09-06 DIAGNOSIS — M25561 Pain in right knee: Secondary | ICD-10-CM | POA: Insufficient documentation

## 2021-09-23 ENCOUNTER — Emergency Department (HOSPITAL_COMMUNITY)
Admission: EM | Admit: 2021-09-23 | Discharge: 2021-09-23 | Disposition: A | Discharge status: Home / Self Care | Payer: Medicare Other | Attending: Emergency Medicine | Admitting: Emergency Medicine

## 2021-09-23 ENCOUNTER — Encounter (HOSPITAL_COMMUNITY): Payer: Self-pay | Admitting: Emergency Medicine

## 2021-09-23 DIAGNOSIS — S4992XA Unspecified injury of left shoulder and upper arm, initial encounter: Secondary | ICD-10-CM | POA: Diagnosis present

## 2021-09-23 DIAGNOSIS — Z79899 Other long term (current) drug therapy: Secondary | ICD-10-CM | POA: Diagnosis not present

## 2021-09-23 DIAGNOSIS — I120 Hypertensive chronic kidney disease with stage 5 chronic kidney disease or end stage renal disease: Secondary | ICD-10-CM | POA: Diagnosis not present

## 2021-09-23 DIAGNOSIS — W010XXA Fall on same level from slipping, tripping and stumbling without subsequent striking against object, initial encounter: Secondary | ICD-10-CM | POA: Diagnosis not present

## 2021-09-23 DIAGNOSIS — N185 Chronic kidney disease, stage 5: Secondary | ICD-10-CM | POA: Diagnosis not present

## 2021-09-23 DIAGNOSIS — Z87891 Personal history of nicotine dependence: Secondary | ICD-10-CM | POA: Insufficient documentation

## 2021-09-23 DIAGNOSIS — E1122 Type 2 diabetes mellitus with diabetic chronic kidney disease: Secondary | ICD-10-CM | POA: Diagnosis not present

## 2021-09-23 DIAGNOSIS — Z7984 Long term (current) use of oral hypoglycemic drugs: Secondary | ICD-10-CM | POA: Insufficient documentation

## 2021-09-23 DIAGNOSIS — S41112A Laceration without foreign body of left upper arm, initial encounter: Secondary | ICD-10-CM | POA: Diagnosis not present

## 2021-09-23 DIAGNOSIS — Z85828 Personal history of other malignant neoplasm of skin: Secondary | ICD-10-CM | POA: Insufficient documentation

## 2021-09-23 DIAGNOSIS — Z794 Long term (current) use of insulin: Secondary | ICD-10-CM | POA: Insufficient documentation

## 2021-09-23 DIAGNOSIS — Z7982 Long term (current) use of aspirin: Secondary | ICD-10-CM | POA: Insufficient documentation

## 2021-09-23 NOTE — ED Provider Notes (Signed)
[place] DEPT Provider Note   CSN: [id number] Arrival date & time: 09/23/21  2125     History Chief Complaint  Patient presents with   Skin Laceration    Tyler Mueller is a 80 y.o. male presents to the emergency department with skin tear to left upper arm.  Patient reports that just prior to arrival in emergency department he he lost his balance after tripping.  His son caught his arm and kept him from falling however developed skin tear to left forearm.  Patient reports that he thoroughly cleaned the area prior to arrival in the emergency department.  Patient denies any pain to affected area.  HPI     Past Medical History:  Diagnosis Date   Chronic kidney disease    RENAL INSUFFICIENCY   Diverticulitis    History of lung transplant ([place]) 04/21/2014   Hypertension    Lung transplanted ([place]) 2004   BILATERAL    Muscle spasm of back 04/21/2014   Skin cancer of face     Patient Active Problem List   Diagnosis Date Noted   Clostridium difficile infection 04/27/2014   Herpes zoster 04/23/2014   Muscle spasm of back 04/21/2014   History of lung transplant ([place]) 04/21/2014   Diverticulitis 04/20/2014   CKD (chronic kidney disease) stage 5, GFR less than 15 ml/min ([place]) 04/20/2014   DIABETES MELLITUS 01/04/2008   DYSLIPIDEMIA 01/04/2008   IMMUNOCOMPROMISED 01/04/2008   BRONCHIECTASIS 01/04/2008   DYSPNEA ON EXERTION 76/72/0947   Complications of transplanted lung 12/10/2007    Past Surgical History:  Procedure Laterality Date   APPENDECTOMY     CATARACT EXTRACTION     LUNG TRANSPLANT, DOUBLE  2004       No family history on file.  Social History   Tobacco Use   Smoking status: Former    Types: Cigarettes    Quit date: 04/22/2003    Years since quitting: 18.4   Smokeless tobacco: Never  Substance Use Topics   Alcohol use: No    Comment: QUIT ALCOHOL 2004   Drug use: No    Home Medications Prior to Admission  medications   Medication Sig Start Date End Date Taking? Authorizing Provider  acyclovir (ZOVIRAX) 200 MG capsule Take 4 capsules (800 mg total) by mouth daily. 04/27/14   Jerrye Noble, MD  amLODipine (NORVASC) 5 MG tablet Take 5 mg by mouth daily.    [provider]  aspirin EC 81 MG tablet Take 81 mg by mouth daily.    [provider]  calcium acetate (PHOSLO) 667 MG capsule Take 667 mg by mouth 2 (two) times daily with a meal.    [provider]  carvedilol (COREG) 25 MG tablet Take 25 mg by mouth 2 (two) times daily with a meal.    [provider]  doxazosin (CARDURA) 4 MG tablet Take 4 mg by mouth daily.    [provider]  ferrous gluconate (FERGON) 225 (27 FE) MG tablet Take 240 mg by mouth 3 (three) times daily with meals.    [provider]  glipiZIDE (GLUCOTROL) 5 MG tablet Take 5 mg by mouth daily before breakfast.    [provider]  insulin aspart (NOVOLOG FLEXPEN) 100 UNIT/ML FlexPen Inject 2-4 Units into the skin See admin instructions. Take 2 units at breakfast, 4 units at lunch and 2 units at dinner plus corrction scale insulin with meals: Glucose Range blood sugar <200 none, 201-250 mg dl 1 unit,  251-300 mg/dl 2 units, 301-350 mg/dl 3 units, 351-400 mg/ dl 4 units, >400 mg/dl take 5 units and call your provider Doctor's comments: ICD 10 code: E11.69    [provider]  Multiple Vitamin (MULTIVITAMIN WITH MINERALS) TABS tablet Take 1 tablet by mouth daily.    [provider]  ondansetron (ZOFRAN) 4 MG tablet Take 1 tablet (4 mg total) by mouth every 6 (six) hours as needed for nausea or vomiting. 0/92/33   Delora Fuel, MD  potassium chloride SA (K-DUR,KLOR-CON) 20 MEQ tablet Take 1 tablet (20 mEq total) by mouth daily. 0/07/62   Delora Fuel, MD  pravastatin (PRAVACHOL) 40 MG tablet Take 20 mg by mouth daily.    [provider]  predniSONE (DELTASONE) 5 MG tablet Take 5 mg by mouth 2 (two) times  daily with a meal.    [provider]  ranitidine (ZANTAC) 150 MG tablet Take 150 mg by mouth 2 (two) times daily.    [provider]  sodium bicarbonate 650 MG tablet Take 1,300 mg by mouth 3 (three) times daily.    [provider]  sulfamethoxazole-trimethoprim (BACTRIM,SEPTRA) 400-80 MG per tablet Take 1 tablet by mouth See admin instructions. Take 1 tablet by mouth nightly every Tuesday, Thursday, and Saturday take after dialysis. Take for Pneumocystis jiroveci pneumonia prevention.    [provider]  tacrolimus (PROGRAF) 0.5 MG capsule Take 0.5-1 mg by mouth See admin instructions. Take two 0.5 mg capsules(1.0mg ) by mouth in the AM and one 0.5 mg (0.5mg ) capsules in the PM    [provider]  vancomycin (VANCOCIN) 50 mg/mL oral solution Take 2.5 mLs (125 mg total) by mouth every 6 (six) hours. 04/28/14   Jerrye Noble, MD    Allergies    Morphine and related and Penicillins  Review of Systems   Review of Systems  Musculoskeletal:  Negative for myalgias.  Skin:  Positive for wound. Negative for color change, pallor and rash.  Neurological:  Negative for weakness and numbness.   Physical Exam Updated Vital Signs BP (!) 149/59   Pulse 87   Temp 98.4 F (36.9 C) (Oral)   Resp 16   Ht 5\' 8"  (1.727 m)   Wt 68 kg   SpO2 99%   BMI 22.81 kg/m   Physical Exam Vitals and nursing note reviewed.  Constitutional:      General: He is not in acute distress.    Appearance: He is not ill-appearing, toxic-appearing or diaphoretic.  HENT:     Head: Normocephalic.  Eyes:     General: No scleral icterus.       Right eye: No discharge.        Left eye: No discharge.  Cardiovascular:     Rate and Rhythm: Normal rate.  Pulmonary:     Effort: Pulmonary effort is normal.  Musculoskeletal:     Comments: 7 cm skin tear to left upper arm.  Edges are jagged.  Hemorrhage controlled.  Wound is clean, no purulent discharge or surrounding erythema.   Fistula to left forearm with palpable thrill.  Patient has full range of motion to left upper extremity.  Skin:    General: Skin is warm and dry.  Neurological:     General: No focal deficit present.     Mental Status: He is alert.     GCS: GCS eye subscore is 4. GCS verbal subscore is 5. GCS motor subscore is 6.  Psychiatric:  Behavior: Behavior is cooperative.     ED Results / Procedures / Treatments   Labs (all labs ordered are listed, but only abnormal results are displayed) Labs Reviewed - No data to display  EKG None  Radiology No results found.  Procedures .Marland KitchenLaceration Repair  Date/Time: 09/23/2021 11:45 PM Performed by: Loni Beckwith, PA-C Authorized by: Loni Beckwith, PA-C   Consent:    Consent obtained:  Verbal   Consent given by:  Patient   Risks discussed:  Infection, need for additional repair, pain, poor cosmetic result and poor wound healing   Alternatives discussed:  No treatment and delayed treatment Universal protocol:    Procedure explained and questions answered to patient or proxy's satisfaction: yes     Patient identity confirmed:  Verbally with patient Anesthesia:    Anesthesia method:  None Laceration details:    Location:  Shoulder/arm   Shoulder/arm location:  L upper arm   Length (cm):  7 Skin repair:    Repair method:  Steri-Strips   Number of Steri-Strips:  3 Approximation:    Approximation:  Loose Repair type:    Repair type:  Simple Post-procedure details:    Dressing:  Non-adherent dressing and sterile dressing   Procedure completion:  Tolerated well, no immediate complications   Medications Ordered in ED Medications - No data to display  ED Course  I have reviewed the triage vital signs and the nursing notes.  Pertinent labs & imaging results that were available during my care of the patient were reviewed by me and considered in my medical decision making (see chart for details).    MDM  Rules/Calculators/A&P                           Alert 80 year old male no acute stress, nontoxic-appearing.  Presents with chief complaint of skin tear to left upper forearm.  Patient denies any falls or hitting his head.  Patient denies any pain.  Patient reports received tetanus shot last month.  Patient states he thoroughly cleaned the wound prior to arrival in the emergency department.  Hemorrhage controlled at this time.  Wound is clean.  Wound repaired as noted above.  Patient given instructions on proper wound care.  Discussed results, findings, treatment and follow up. Patient advised of return precautions. Patient verbalized understanding and agreed with plan.   Final Clinical Impression(s) / ED Diagnoses Final diagnoses:  Skin tear of left upper arm without complication, initial encounter    Rx / DC Orders ED Discharge Orders     None        Loni Beckwith, PA-C 09/23/21 2346    Malvin Johns, MD 09/25/21 1450

## 2021-09-23 NOTE — ED Triage Notes (Signed)
Pt reports he was able to fall and his son grabbed his arm to catch him and tore his skin on his left upper arm. Denies pain, lightheadedness, dizziness.

## 2021-09-23 NOTE — Discharge Instructions (Addendum)
You came to the emergency department today to be evaluated for your left upper arm skin tear.  Your skin tear was repaired with Steri-Strips.  Please keep the area dry for the next 24 hours.  After that you may gently clean the area with soap and water.  Please look for any signs of infection.  Please follow-up with your primary care provider as needed.  Get help right away if: You develop severe swelling around the wound. Your pain suddenly increases and is severe. You develop painful lumps near the wound or on skin anywhere else on your body. You have a red streak going away from your wound. The wound is on your hand or foot, and you cannot properly move a finger or toe. The wound is on your hand or foot, and you notice that your fingers or toes look pale or bluish.

## 2021-10-14 ENCOUNTER — Emergency Department (HOSPITAL_COMMUNITY): Payer: Medicare Other

## 2021-10-14 ENCOUNTER — Other Ambulatory Visit: Payer: Self-pay

## 2021-10-14 ENCOUNTER — Emergency Department (HOSPITAL_COMMUNITY)
Admission: EM | Admit: 2021-10-14 | Discharge: 2021-10-14 | Disposition: A | Discharge status: Home / Self Care | Payer: Medicare Other | Attending: Emergency Medicine | Admitting: Emergency Medicine

## 2021-10-14 ENCOUNTER — Encounter (HOSPITAL_COMMUNITY): Payer: Self-pay | Admitting: Emergency Medicine

## 2021-10-14 DIAGNOSIS — Z7982 Long term (current) use of aspirin: Secondary | ICD-10-CM | POA: Insufficient documentation

## 2021-10-14 DIAGNOSIS — R519 Headache, unspecified: Secondary | ICD-10-CM | POA: Insufficient documentation

## 2021-10-14 DIAGNOSIS — Z7984 Long term (current) use of oral hypoglycemic drugs: Secondary | ICD-10-CM | POA: Diagnosis not present

## 2021-10-14 DIAGNOSIS — T148XXA Other injury of unspecified body region, initial encounter: Secondary | ICD-10-CM

## 2021-10-14 DIAGNOSIS — R42 Dizziness and giddiness: Secondary | ICD-10-CM | POA: Diagnosis not present

## 2021-10-14 DIAGNOSIS — Z87891 Personal history of nicotine dependence: Secondary | ICD-10-CM | POA: Diagnosis not present

## 2021-10-14 DIAGNOSIS — N186 End stage renal disease: Secondary | ICD-10-CM | POA: Insufficient documentation

## 2021-10-14 DIAGNOSIS — R55 Syncope and collapse: Secondary | ICD-10-CM | POA: Insufficient documentation

## 2021-10-14 DIAGNOSIS — Z79899 Other long term (current) drug therapy: Secondary | ICD-10-CM | POA: Diagnosis not present

## 2021-10-14 DIAGNOSIS — Z794 Long term (current) use of insulin: Secondary | ICD-10-CM | POA: Diagnosis not present

## 2021-10-14 DIAGNOSIS — W19XXXA Unspecified fall, initial encounter: Secondary | ICD-10-CM | POA: Diagnosis not present

## 2021-10-14 DIAGNOSIS — E162 Hypoglycemia, unspecified: Secondary | ICD-10-CM | POA: Insufficient documentation

## 2021-10-14 DIAGNOSIS — S0001XA Abrasion of scalp, initial encounter: Secondary | ICD-10-CM | POA: Diagnosis not present

## 2021-10-14 DIAGNOSIS — I120 Hypertensive chronic kidney disease with stage 5 chronic kidney disease or end stage renal disease: Secondary | ICD-10-CM | POA: Diagnosis not present

## 2021-10-14 DIAGNOSIS — E1122 Type 2 diabetes mellitus with diabetic chronic kidney disease: Secondary | ICD-10-CM | POA: Insufficient documentation

## 2021-10-14 DIAGNOSIS — S060X0A Concussion without loss of consciousness, initial encounter: Secondary | ICD-10-CM | POA: Diagnosis not present

## 2021-10-14 DIAGNOSIS — S0990XA Unspecified injury of head, initial encounter: Secondary | ICD-10-CM | POA: Diagnosis present

## 2021-10-14 LAB — TROPONIN I (HIGH SENSITIVITY)
Troponin I (High Sensitivity): 27 ng/L — ABNORMAL HIGH (ref <18)
Troponin I (High Sensitivity): 26 ng/L — ABNORMAL HIGH (ref <18)

## 2021-10-14 LAB — CBC WITH DIFFERENTIAL/PLATELET
Abs Immature Granulocytes: 0.09 10*3/uL — ABNORMAL HIGH (ref 0.00–0.07)
Basophils Absolute: 0 10*3/uL (ref 0.0–0.1)
Basophils Relative: 0 %
Eosinophils Absolute: 0.1 10*3/uL (ref 0.0–0.5)
Eosinophils Relative: 1 %
HCT: 29.1 % — ABNORMAL LOW (ref 39.0–52.0)
Hemoglobin: 8.7 g/dL — ABNORMAL LOW (ref 13.0–17.0)
Immature Granulocytes: 1 %
Lymphocytes Relative: 15 %
Lymphs Abs: 1.2 10*3/uL (ref 0.7–4.0)
MCH: 28.3 pg (ref 26.0–34.0)
MCHC: 29.9 g/dL — ABNORMAL LOW (ref 30.0–36.0)
MCV: 94.8 fL (ref 80.0–100.0)
Monocytes Absolute: 1.1 10*3/uL — ABNORMAL HIGH (ref 0.1–1.0)
Monocytes Relative: 14 %
Neutro Abs: 5.3 10*3/uL (ref 1.7–7.7)
Neutrophils Relative %: 69 %
Platelets: 175 10*3/uL (ref 150–400)
RBC: 3.07 MIL/uL — ABNORMAL LOW (ref 4.22–5.81)
RDW: 19.9 % — ABNORMAL HIGH (ref 11.5–15.5)
WBC: 7.8 10*3/uL (ref 4.0–10.5)
nRBC: 1.2 % — ABNORMAL HIGH (ref 0.0–0.2)

## 2021-10-14 LAB — COMPREHENSIVE METABOLIC PANEL
ALT: 13 U/L (ref 0–44)
AST: 22 U/L (ref 15–41)
Albumin: 3.6 g/dL (ref 3.5–5.0)
Alkaline Phosphatase: 93 U/L (ref 38–126)
Anion gap: 10 (ref 5–15)
BUN: 15 mg/dL (ref 8–23)
CO2: 31 mmol/L (ref 22–32)
Calcium: 7.9 mg/dL — ABNORMAL LOW (ref 8.9–10.3)
Chloride: 97 mmol/L — ABNORMAL LOW (ref 98–111)
Creatinine, Ser: 3.05 mg/dL — ABNORMAL HIGH (ref 0.61–1.24)
GFR, Estimated: 20 mL/min — ABNORMAL LOW (ref >60)
Glucose, Bld: 46 mg/dL — ABNORMAL LOW (ref 70–99)
Potassium: 3.9 mmol/L (ref 3.5–5.1)
Sodium: 138 mmol/L (ref 135–145)
Total Bilirubin: 0.5 mg/dL (ref 0.3–1.2)
Total Protein: 6.7 g/dL (ref 6.5–8.1)

## 2021-10-14 LAB — CK: Total CK: 36 U/L — ABNORMAL LOW (ref 49–397)

## 2021-10-14 LAB — CBG MONITORING, ED: Glucose-Capillary: 224 mg/dL — ABNORMAL HIGH (ref 70–99)

## 2021-10-14 NOTE — ED Provider Notes (Signed)
[facility] DEPT Provider Note   CSN: [id number] Arrival date & time: 10/14/21  1631     History Chief Complaint  Patient presents with   Near Syncope    Tyler Mueller is a 80 y.o. male.  This is a 80 y.o. 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  Patient accompanied by spouse.  Patient reports he was ambulating in his house, he felt lightheaded and fell to the ground.  Spouse reports patient fell backwards onto his head onto the gravel.  Wound to the back of his head.  Tetanus status is up-to-date.  No blood thinners.  No seizure-like activity was reported, no bowel or bladder incontinence, no nausea or vomiting.  No chest pain or dyspnea or palpitations prior to episode.  Patient reports this is occurred in the past.  Per EMS blood sugar was low on their arrival, around 60-70.  Patient is on long-acting insulin at nighttime and postprandial insulin in the daytime.  He ate a smaller lunch than normal consisting of soup and crackers.  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  Loc lasted around 1-2 minutes. He does have mild ha to posterior portion of head at this time.   The history is provided by the patient and the spouse. No language interpreter was used.  Near Syncope Associated symptoms include headaches. Pertinent negatives include no chest pain, no abdominal pain and no shortness of breath.      Past Medical History:  Diagnosis Date   Chronic kidney disease    RENAL INSUFFICIENCY   Diverticulitis    History of lung transplant ([place]) 04/21/2014   Hypertension    Lung transplanted ([place]) 2004   BILATERAL     Muscle spasm of back 04/21/2014   Skin cancer of face     Patient Active Problem List   Diagnosis Date Noted   Clostridium difficile infection 04/27/2014   Herpes zoster 04/23/2014   Muscle spasm of back 04/21/2014   History of lung transplant ([place]) 04/21/2014   Diverticulitis 04/20/2014   CKD (chronic kidney disease) stage 5, GFR less than 15 ml/min ([place]) 04/20/2014   DIABETES MELLITUS 01/04/2008   DYSLIPIDEMIA 01/04/2008   IMMUNOCOMPROMISED 01/04/2008   BRONCHIECTASIS 01/04/2008   DYSPNEA ON EXERTION 25/42/7062   Complications of transplanted lung 12/10/2007    Past Surgical History:  Procedure Laterality Date   APPENDECTOMY     CATARACT EXTRACTION     LUNG TRANSPLANT, DOUBLE  2004       History reviewed. No pertinent family history.  Social History   Tobacco Use   Smoking status: Former    Types: Cigarettes    Quit date: 04/22/2003    Years since quitting: 18.4   Smokeless tobacco: Never  Substance Use Topics   Alcohol use: No    Comment: QUIT ALCOHOL 2004   Drug use: No    Home Medications Prior to Admission medications   Medication Sig Start Date End Date Taking? Authorizing Provider  acyclovir (ZOVIRAX) 200 MG capsule Take 4 capsules (800 mg total) by mouth daily. 04/27/14   Jerrye Noble, MD  amLODipine (NORVASC) 5 MG tablet Take 5 mg by mouth daily.  [provider]  aspirin EC 81 MG tablet Take 81 mg by mouth daily.    [provider]  calcium acetate (PHOSLO) 667 MG capsule Take 667 mg by mouth 2 (two) times daily with a meal.    [provider]  carvedilol (COREG) 25 MG tablet Take 25 mg by mouth 2 (two) times daily with a meal.    [provider]  doxazosin (CARDURA) 4 MG tablet Take 4 mg by mouth daily.    [provider]  ferrous gluconate (FERGON) 225 (27 FE) MG tablet Take 240 mg by mouth 3 (three) times daily with meals.    [provider]  glipiZIDE (GLUCOTROL) 5 MG tablet Take 5 mg by mouth  daily before breakfast.    [provider]  insulin aspart (NOVOLOG FLEXPEN) 100 UNIT/ML FlexPen Inject 2-4 Units into the skin See admin instructions. Take 2 units at breakfast, 4 units at lunch and 2 units at dinner plus corrction scale insulin with meals: Glucose Range blood sugar <200 none, 201-250 mg dl 1 unit, 251-300 mg/dl 2 units, 301-350 mg/dl 3 units, 351-400 mg/ dl 4 units, >400 mg/dl take 5 units and call your provider Doctor's comments: ICD 10 code: E11.69    [provider]  Multiple Vitamin (MULTIVITAMIN WITH MINERALS) TABS tablet Take 1 tablet by mouth daily.    [provider]  ondansetron (ZOFRAN) 4 MG tablet Take 1 tablet (4 mg total) by mouth every 6 (six) hours as needed for nausea or vomiting. 06/09/22   Delora Fuel, MD  potassium chloride SA (K-DUR,KLOR-CON) 20 MEQ tablet Take 1 tablet (20 mEq total) by mouth daily. 7/62/83   Delora Fuel, MD  pravastatin (PRAVACHOL) 40 MG tablet Take 20 mg by mouth daily.    [provider]  predniSONE (DELTASONE) 5 MG tablet Take 5 mg by mouth 2 (two) times daily with a meal.    [provider]  ranitidine (ZANTAC) 150 MG tablet Take 150 mg by mouth 2 (two) times daily.    [provider]  sodium bicarbonate 650 MG tablet Take 1,300 mg by mouth 3 (three) times daily.    [provider]  sulfamethoxazole-trimethoprim (BACTRIM,SEPTRA) 400-80 MG per tablet Take 1 tablet by mouth See admin instructions. Take 1 tablet by mouth nightly every Tuesday, Thursday, and Saturday take after dialysis. Take for Pneumocystis jiroveci pneumonia prevention.    [provider]  tacrolimus (PROGRAF) 0.5 MG capsule Take 0.5-1 mg by mouth See admin instructions. Take two 0.5 mg capsules(1.0mg ) by mouth in the AM and one 0.5 mg (0.5mg ) capsules in the PM    [provider]  vancomycin (VANCOCIN) 50 mg/mL oral solution Take 2.5 mLs (125 mg total) by mouth every 6 (six) hours. 04/28/14    Jerrye Noble, MD    Allergies    Morphine and related and Penicillins  Review of Systems   Review of Systems  Constitutional:  Negative for chills and fever.  HENT:  Negative for facial swelling and trouble swallowing.   Eyes:  Negative for photophobia and visual disturbance.  Respiratory:  Negative for cough and shortness of breath.   Cardiovascular:  Positive for near-syncope. Negative for chest pain and palpitations.  Gastrointestinal:  Negative for abdominal pain, nausea and vomiting.  Endocrine: Negative for polydipsia and polyuria.  Genitourinary:  Negative for difficulty urinating and hematuria.  Musculoskeletal:  Negative for gait problem and joint swelling.  Skin:  Negative for pallor and rash.  Neurological:  Positive for syncope and headaches.  Psychiatric/Behavioral:  Negative for agitation and confusion.    Physical Exam Updated Vital Signs BP (!) 180/70   Pulse 81   Temp 98.3 F (36.8 C) (Oral)   Resp (!) 21   Ht 5\' 8"  (1.727 m)   Wt 56.2 kg   SpO2 97%   BMI 18.85 kg/m   Physical Exam Vitals and nursing note reviewed.  Constitutional:      General: He is not in acute distress.    Appearance: He is well-developed.  HENT:     Head: Normocephalic.      Right Ear: External ear normal.     Left Ear: External ear normal.     Mouth/Throat:     Mouth: Mucous membranes are moist.  Eyes:     General: No scleral icterus. Cardiovascular:     Rate and Rhythm: Normal rate and regular rhythm.     Pulses: Normal pulses.     Heart sounds: Normal heart sounds.  Pulmonary:     Effort: Pulmonary effort is normal. No respiratory distress.     Breath sounds: Normal breath sounds.  Abdominal:     General: Abdomen is flat.     Palpations: Abdomen is soft.     Tenderness: There is no abdominal tenderness.  Musculoskeletal:        General: Normal range of motion.     Cervical back: Normal range of motion.     Right lower leg: No edema.     Left lower leg: No edema.   Skin:    General: Skin is warm and dry.     Capillary Refill: Capillary refill takes less than 2 seconds.  Neurological:     Mental Status: He is alert and oriented to person, place, and time.  Psychiatric:        Mood and Affect: Mood normal.        Behavior: Behavior normal.    ED Results / Procedures / Treatments   Labs (all labs ordered are listed, but only abnormal results are displayed) Labs Reviewed  CBC WITH DIFFERENTIAL/PLATELET - Abnormal; Notable for the following components:      Result Value   RBC 3.07 (*)    Hemoglobin 8.7 (*)    HCT 29.1 (*)    MCHC 29.9 (*)    RDW 19.9 (*)    nRBC 1.2 (*)    Monocytes Absolute 1.1 (*)    Abs Immature Granulocytes 0.09 (*)    All other components within normal limits  COMPREHENSIVE METABOLIC PANEL - Abnormal; Notable for the following components:   Chloride 97 (*)    Glucose, Bld 46 (*)    Creatinine, Ser 3.05 (*)    Calcium 7.9 (*)    GFR, Estimated 20 (*)    All other components within normal limits  CK - Abnormal; Notable for the following components:   Total CK 36 (*)    All other components within normal limits  CBG MONITORING, ED - Abnormal; Notable for the following components:   Glucose-Capillary 224 (*)    All other components within normal limits  TROPONIN I (HIGH SENSITIVITY) - Abnormal; Notable for the following components:   Troponin I (High Sensitivity) 27 (*)    All other components within normal limits  TROPONIN I (HIGH SENSITIVITY) - Abnormal; Notable for the following components:   Troponin I (High Sensitivity) 26 (*)    All other components within normal limits  URINALYSIS, ROUTINE W REFLEX MICROSCOPIC  EKG EKG Interpretation  Date/Time:  Thursday October 14 2021 16:51:15 EST Ventricular Rate:  78 PR Interval:  173 QRS Duration: 96 QT Interval:  408 QTC Calculation: 465 R Axis:   -32 Text Interpretation: Sinus rhythm LVH with secondary repolarization abnormality Similar to prior tracing  Confirmed by Wynona Dove ([id number]) on 10/14/2021 10:17:28 PM  Radiology DG Chest 2 View  Result Date: 10/14/2021 CLINICAL DATA:  LOC EXAM: CHEST - 2 VIEW COMPARISON:  06/10/2021 FINDINGS: Lower sternotomy changes. No focal opacity or pleural effusion. Borderline cardiomegaly with aortic atherosclerosis. No pneumothorax. IMPRESSION: No active cardiopulmonary disease. Electronically Signed   By: Donavan Foil M.D.   On: 10/14/2021 18:31   CT HEAD WO CONTRAST (5MM)  Result Date: 10/14/2021 CLINICAL DATA:  Syncope EXAM: CT HEAD WITHOUT CONTRAST CT CERVICAL SPINE WITHOUT CONTRAST TECHNIQUE: Multidetector CT imaging of the head and cervical spine was performed following the standard protocol without intravenous contrast. Multiplanar CT image reconstructions of the cervical spine were also generated. COMPARISON:  None. FINDINGS: CT HEAD FINDINGS BRAIN: BRAIN Cerebral ventricle sizes are concordant with the degree of cerebral volume loss. Patchy and confluent areas of decreased attenuation are noted throughout the deep and periventricular white matter of the cerebral hemispheres bilaterally, compatible with chronic microvascular ischemic disease. 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 No evidence of large-territorial acute infarction. No parenchymal hemorrhage. No mass lesion. No extra-axial collection. No mass effect or midline shift. No hydrocephalus. Basilar cisterns are patent. Vascular: No hyperdense vessel. Atherosclerotic calcifications are present within the cavernous internal carotid and vertebral arteries. Skull: No acute fracture or focal lesion. Sinuses/Orbits: Paranasal sinuses and mastoid air cells are clear. Bilateral lens replacement. Otherwise orbits are unremarkable. Other: None. CT CERVICAL SPINE FINDINGS Alignment: Normal. Skull base and vertebrae: Multilevel degenerative changes of the spine. No associated severe osseous central canal or neural foraminal stenosis. No acute  fracture. No aggressive appearing focal osseous lesion or focal pathologic process. Soft tissues and spinal canal: No prevertebral fluid or swelling. No visible canal hematoma. Upper chest: Unremarkable. Other: Atherosclerotic plaque of the carotid arteries. IMPRESSION: 1. No acute intracranial abnormality. 2. No acute displaced fracture or traumatic listhesis of the cervical spine. Electronically Signed   By: Iven Finn M.D.   On: 10/14/2021 18:11   CT Cervical Spine Wo Contrast  Result Date: 10/14/2021 CLINICAL DATA:  Syncope EXAM: CT HEAD WITHOUT CONTRAST CT CERVICAL SPINE WITHOUT CONTRAST TECHNIQUE: Multidetector CT imaging of the head and cervical spine was performed following the standard protocol without intravenous contrast. Multiplanar CT image reconstructions of the cervical spine were also generated. COMPARISON:  None. FINDINGS: CT HEAD FINDINGS BRAIN: BRAIN Cerebral ventricle sizes are concordant with the degree of cerebral volume loss. Patchy and confluent areas of decreased attenuation are noted throughout the deep and periventricular white matter of the cerebral hemispheres bilaterally, compatible with chronic microvascular ischemic disease. 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 No evidence of large-territorial acute infarction. No parenchymal hemorrhage. No mass lesion. No extra-axial collection. No mass effect or midline shift. No hydrocephalus. Basilar cisterns are patent. Vascular: No hyperdense vessel. Atherosclerotic calcifications are present within the cavernous internal carotid and vertebral arteries. Skull: No acute fracture or focal lesion. Sinuses/Orbits: Paranasal sinuses and mastoid air cells are clear. Bilateral lens replacement. Otherwise orbits are unremarkable. Other: None. CT CERVICAL SPINE FINDINGS Alignment: Normal. Skull base and vertebrae: Multilevel degenerative changes of the spine. No associated severe osseous central canal or neural foraminal  stenosis. No acute fracture.  No aggressive appearing focal osseous lesion or focal pathologic process. Soft tissues and spinal canal: No prevertebral fluid or swelling. No visible canal hematoma. Upper chest: Unremarkable. Other: Atherosclerotic plaque of the carotid arteries. IMPRESSION: 1. No acute intracranial abnormality. 2. No acute displaced fracture or traumatic listhesis of the cervical spine. Electronically Signed   By: Iven Finn M.D.   On: 10/14/2021 18:11    Procedures Procedures   Medications Ordered in ED Medications - No data to display  ED Course  I have reviewed the triage vital signs and the nursing notes.  Pertinent labs & imaging results that were available during my care of the patient were reviewed by me and considered in my medical decision making (see chart for details).    MDM Rules/Calculators/A&P                           CC: fall, head injury  This patient complains of above; this involves an extensive number of treatment options and is a complaint that carries with it a high risk of complications and morbidity. Vital signs were reviewed. Serious etiologies considered.  Record review:  Previous records obtained and reviewed   Additional history obtained from family at bedside  Work up as above, notable for:  Labs & imaging results that were available during my care of the patient were reviewed by me and considered in my medical decision making.   I ordered imaging studies which included CTH, cspine CXR and I independently visualized and interpreted imaging which showed no acute process  Management: Given food, tolerating PO well  Reassessment:  Pt reports feeling much better, he is ambulatory.  Repeat POC glucose was over 200.  Delta troponin downtrending.  Troponin is elevated.  No chest pain or palpitations.  No Dyspnea.  She feels that he is back to his baseline.  He is ambulatory with steady gait.  Requesting go home this time.  Advised patient  to follow-up with his PCP regarding mildly reduced hemoglobin. Denie black stool or blood per rectum.  The patient improved significantly and was discharged in stable condition. Detailed discussions were had with the patient regarding current findings, and need for close f/u with PCP or on call doctor. The patient has been instructed to return immediately if the symptoms worsen in any way for re-evaluation. Patient verbalized understanding and is in agreement with current care plan. All questions answered prior to discharge.           This chart was dictated using voice recognition software.  Despite best efforts to proofread,  errors can occur which can change the documentation meaning.  Final Clinical Impression(s) / ED Diagnoses Final diagnoses:  Fall, initial encounter  Concussion without loss of consciousness, initial encounter  Abrasion  Hypoglycemia    Rx / DC Orders ED Discharge Orders     None        Jeanell Sparrow, DO 10/14/21 2226

## 2021-10-14 NOTE — Discharge Instructions (Addendum)
Based on the events which brought you to the ER today, it is possible that you may have a concussion. A concussion occurs when there is a blow to the head or body, with enough force to shake the brain and disrupt how the brain functions. You may experience symptoms such as headaches, sensitivity to light/noise, dizziness, cognitive slowing, difficulty concentrating / remembering, trouble sleeping and drowsiness. These symptoms may last anywhere from hours/days to potentially weeks/months. While these symptoms are very frustrating and perhaps debilitating, it is important that you remember that they will improve over time. Everyone has a different rate of recovery; it is difficult to predict when your symptoms will resolve. In order to allow for your brain to heal after the injury, we recommend that you see your primary physician or a physician knowledgeable in concussion management. We also advise you to let your body and brain rest: avoid physical activities (sports, gym, and exercise) and reduce cognitive demands (reading, texting, TV watching, computer use, video games, etc). School attendance, after-school activities and work may need to be modified to avoid increasing symptoms. We recommend against driving until until all symptoms have resolved. You should take 650mg  of Acetaminophen (Tylenol) every 4 hours as needed for pain control; however, taking anti-inflammatory medication (Motrin/Advil/Ibuprofen) is not advised. Come back to the ER right away if you are having repeated episodes of vomiting, severe/worsening headache/dizziness or any other symptom that alarms you. We recommended that someone stay with you for the next 24 hours to monitor for these worrisome symptoms.

## 2021-10-14 NOTE — ED Triage Notes (Signed)
Pt to ER via EMS from home.  Pt had syncopal episode at home.  CBG for first responders was 56, after eating CBG noted at 99.  Pt denies current dizziness or neurological symptoms.  Pt hit back of his head, abrasion covered by first responders.  Pt denies other injury at this time.

## 2021-11-26 ENCOUNTER — Emergency Department (HOSPITAL_COMMUNITY): Payer: Medicare Other

## 2021-11-26 ENCOUNTER — Inpatient Hospital Stay (HOSPITAL_COMMUNITY)
Admission: EM | Admit: 2021-11-26 | Discharge: 2021-11-29 | DRG: 640 | Disposition: A | Discharge status: Home Health | Payer: Medicare Other | Attending: Internal Medicine | Admitting: Internal Medicine

## 2021-11-26 ENCOUNTER — Encounter (HOSPITAL_COMMUNITY): Payer: Self-pay

## 2021-11-26 DIAGNOSIS — Z8701 Personal history of pneumonia (recurrent): Secondary | ICD-10-CM | POA: Diagnosis not present

## 2021-11-26 DIAGNOSIS — Z85828 Personal history of other malignant neoplasm of skin: Secondary | ICD-10-CM | POA: Diagnosis not present

## 2021-11-26 DIAGNOSIS — N2581 Secondary hyperparathyroidism of renal origin: Secondary | ICD-10-CM | POA: Diagnosis present

## 2021-11-26 DIAGNOSIS — M898X9 Other specified disorders of bone, unspecified site: Secondary | ICD-10-CM | POA: Diagnosis present

## 2021-11-26 DIAGNOSIS — R0989 Other specified symptoms and signs involving the circulatory and respiratory systems: Secondary | ICD-10-CM | POA: Diagnosis present

## 2021-11-26 DIAGNOSIS — Z87891 Personal history of nicotine dependence: Secondary | ICD-10-CM | POA: Diagnosis not present

## 2021-11-26 DIAGNOSIS — I120 Hypertensive chronic kidney disease with stage 5 chronic kidney disease or end stage renal disease: Secondary | ICD-10-CM | POA: Diagnosis present

## 2021-11-26 DIAGNOSIS — E162 Hypoglycemia, unspecified: Secondary | ICD-10-CM | POA: Diagnosis not present

## 2021-11-26 DIAGNOSIS — R0902 Hypoxemia: Secondary | ICD-10-CM

## 2021-11-26 DIAGNOSIS — E785 Hyperlipidemia, unspecified: Secondary | ICD-10-CM | POA: Diagnosis present

## 2021-11-26 DIAGNOSIS — D849 Immunodeficiency, unspecified: Secondary | ICD-10-CM | POA: Diagnosis not present

## 2021-11-26 DIAGNOSIS — E871 Hypo-osmolality and hyponatremia: Secondary | ICD-10-CM | POA: Diagnosis present

## 2021-11-26 DIAGNOSIS — Z9181 History of falling: Secondary | ICD-10-CM

## 2021-11-26 DIAGNOSIS — Z942 Lung transplant status: Secondary | ICD-10-CM

## 2021-11-26 DIAGNOSIS — J101 Influenza due to other identified influenza virus with other respiratory manifestations: Secondary | ICD-10-CM | POA: Diagnosis present

## 2021-11-26 DIAGNOSIS — N186 End stage renal disease: Secondary | ICD-10-CM | POA: Diagnosis present

## 2021-11-26 DIAGNOSIS — Z885 Allergy status to narcotic agent status: Secondary | ICD-10-CM

## 2021-11-26 DIAGNOSIS — Z20822 Contact with and (suspected) exposure to covid-19: Secondary | ICD-10-CM | POA: Diagnosis present

## 2021-11-26 DIAGNOSIS — Z880 Allergy status to penicillin: Secondary | ICD-10-CM

## 2021-11-26 DIAGNOSIS — I2510 Atherosclerotic heart disease of native coronary artery without angina pectoris: Secondary | ICD-10-CM | POA: Diagnosis present

## 2021-11-26 DIAGNOSIS — Z79899 Other long term (current) drug therapy: Secondary | ICD-10-CM

## 2021-11-26 DIAGNOSIS — Z7982 Long term (current) use of aspirin: Secondary | ICD-10-CM

## 2021-11-26 DIAGNOSIS — Z794 Long term (current) use of insulin: Secondary | ICD-10-CM

## 2021-11-26 DIAGNOSIS — E11649 Type 2 diabetes mellitus with hypoglycemia without coma: Secondary | ICD-10-CM | POA: Diagnosis present

## 2021-11-26 DIAGNOSIS — R531 Weakness: Secondary | ICD-10-CM | POA: Diagnosis not present

## 2021-11-26 DIAGNOSIS — G253 Myoclonus: Secondary | ICD-10-CM | POA: Diagnosis present

## 2021-11-26 DIAGNOSIS — J9601 Acute respiratory failure with hypoxia: Secondary | ICD-10-CM | POA: Diagnosis not present

## 2021-11-26 DIAGNOSIS — D631 Anemia in chronic kidney disease: Secondary | ICD-10-CM | POA: Diagnosis present

## 2021-11-26 DIAGNOSIS — W06XXXA Fall from bed, initial encounter: Secondary | ICD-10-CM | POA: Diagnosis present

## 2021-11-26 DIAGNOSIS — E1142 Type 2 diabetes mellitus with diabetic polyneuropathy: Secondary | ICD-10-CM | POA: Diagnosis present

## 2021-11-26 DIAGNOSIS — R4182 Altered mental status, unspecified: Secondary | ICD-10-CM

## 2021-11-26 DIAGNOSIS — Z7952 Long term (current) use of systemic steroids: Secondary | ICD-10-CM

## 2021-11-26 DIAGNOSIS — Z86718 Personal history of other venous thrombosis and embolism: Secondary | ICD-10-CM

## 2021-11-26 DIAGNOSIS — E1122 Type 2 diabetes mellitus with diabetic chronic kidney disease: Secondary | ICD-10-CM | POA: Diagnosis present

## 2021-11-26 DIAGNOSIS — E875 Hyperkalemia: Secondary | ICD-10-CM | POA: Diagnosis present

## 2021-11-26 DIAGNOSIS — Y92009 Unspecified place in unspecified non-institutional (private) residence as the place of occurrence of the external cause: Secondary | ICD-10-CM

## 2021-11-26 DIAGNOSIS — Z992 Dependence on renal dialysis: Secondary | ICD-10-CM | POA: Diagnosis not present

## 2021-11-26 LAB — CBC WITH DIFFERENTIAL/PLATELET
Abs Immature Granulocytes: 0.06 10*3/uL (ref 0.00–0.07)
Basophils Absolute: 0 10*3/uL (ref 0.0–0.1)
Basophils Relative: 0 %
Eosinophils Absolute: 0 10*3/uL (ref 0.0–0.5)
Eosinophils Relative: 0 %
HCT: 39 % (ref 39.0–52.0)
Hemoglobin: 11.9 g/dL — ABNORMAL LOW (ref 13.0–17.0)
Immature Granulocytes: 1 %
Lymphocytes Relative: 10 %
Lymphs Abs: 1.1 10*3/uL (ref 0.7–4.0)
MCH: 29.2 pg (ref 26.0–34.0)
MCHC: 30.5 g/dL (ref 30.0–36.0)
MCV: 95.6 fL (ref 80.0–100.0)
Monocytes Absolute: 1.2 10*3/uL — ABNORMAL HIGH (ref 0.1–1.0)
Monocytes Relative: 11 %
Neutro Abs: 8.4 10*3/uL — ABNORMAL HIGH (ref 1.7–7.7)
Neutrophils Relative %: 78 %
Platelets: 164 10*3/uL (ref 150–400)
RBC: 4.08 MIL/uL — ABNORMAL LOW (ref 4.22–5.81)
RDW: 17.2 % — ABNORMAL HIGH (ref 11.5–15.5)
WBC: 10.8 10*3/uL — ABNORMAL HIGH (ref 4.0–10.5)
nRBC: 0.3 % — ABNORMAL HIGH (ref 0.0–0.2)

## 2021-11-26 LAB — I-STAT CHEM 8, ED
BUN: 48 mg/dL — ABNORMAL HIGH (ref 8–23)
Calcium, Ion: 0.9 mmol/L — ABNORMAL LOW (ref 1.15–1.40)
Chloride: 101 mmol/L (ref 98–111)
Creatinine, Ser: 8.6 mg/dL — ABNORMAL HIGH (ref 0.61–1.24)
Glucose, Bld: 62 mg/dL — ABNORMAL LOW (ref 70–99)
HCT: 40 % (ref 39.0–52.0)
Hemoglobin: 13.6 g/dL (ref 13.0–17.0)
Potassium: 7.2 mmol/L (ref 3.5–5.1)
Sodium: 135 mmol/L (ref 135–145)
TCO2: 26 mmol/L (ref 22–32)

## 2021-11-26 LAB — BASIC METABOLIC PANEL
Anion gap: 14 (ref 5–15)
Anion gap: 11 (ref 5–15)
BUN: 53 mg/dL — ABNORMAL HIGH (ref 8–23)
BUN: 14 mg/dL (ref 8–23)
CO2: 22 mmol/L (ref 22–32)
CO2: 27 mmol/L (ref 22–32)
Calcium: 7.3 mg/dL — ABNORMAL LOW (ref 8.9–10.3)
Calcium: 8 mg/dL — ABNORMAL LOW (ref 8.9–10.3)
Chloride: 98 mmol/L (ref 98–111)
Chloride: 96 mmol/L — ABNORMAL LOW (ref 98–111)
Creatinine, Ser: 8.85 mg/dL — ABNORMAL HIGH (ref 0.61–1.24)
Creatinine, Ser: 3.91 mg/dL — ABNORMAL HIGH (ref 0.61–1.24)
GFR, Estimated: 6 mL/min — ABNORMAL LOW (ref >60)
GFR, Estimated: 15 mL/min — ABNORMAL LOW (ref >60)
Glucose, Bld: 72 mg/dL (ref 70–99)
Glucose, Bld: 83 mg/dL (ref 70–99)
Potassium: 6.6 mmol/L (ref 3.5–5.1)
Potassium: 4.3 mmol/L (ref 3.5–5.1)
Sodium: 134 mmol/L — ABNORMAL LOW (ref 135–145)
Sodium: 134 mmol/L — ABNORMAL LOW (ref 135–145)

## 2021-11-26 LAB — LACTIC ACID, PLASMA: Lactic Acid, Venous: 1.5 mmol/L (ref 0.5–1.9)

## 2021-11-26 LAB — COMPREHENSIVE METABOLIC PANEL
ALT: 14 U/L (ref 0–44)
AST: 24 U/L (ref 15–41)
Albumin: 3.4 g/dL — ABNORMAL LOW (ref 3.5–5.0)
Alkaline Phosphatase: 80 U/L (ref 38–126)
Anion gap: 18 — ABNORMAL HIGH (ref 5–15)
BUN: 52 mg/dL — ABNORMAL HIGH (ref 8–23)
CO2: 24 mmol/L (ref 22–32)
Calcium: 7.4 mg/dL — ABNORMAL LOW (ref 8.9–10.3)
Chloride: 96 mmol/L — ABNORMAL LOW (ref 98–111)
Creatinine, Ser: 8.77 mg/dL — ABNORMAL HIGH (ref 0.61–1.24)
GFR, Estimated: 6 mL/min — ABNORMAL LOW (ref >60)
Glucose, Bld: 62 mg/dL — ABNORMAL LOW (ref 70–99)
Potassium: 7.2 mmol/L (ref 3.5–5.1)
Sodium: 138 mmol/L (ref 135–145)
Total Bilirubin: 0.8 mg/dL (ref 0.3–1.2)
Total Protein: 6.1 g/dL — ABNORMAL LOW (ref 6.5–8.1)

## 2021-11-26 LAB — HEPATITIS B SURFACE ANTIBODY,QUALITATIVE: Hep B S Ab: NONREACTIVE

## 2021-11-26 LAB — RESP PANEL BY RT-PCR (FLU A&B, COVID) ARPGX2
Influenza A by PCR: POSITIVE — AB
Influenza B by PCR: NEGATIVE
SARS Coronavirus 2 by RT PCR: NEGATIVE

## 2021-11-26 LAB — AMMONIA: Ammonia: 24 umol/L (ref 9–35)

## 2021-11-26 LAB — CK: Total CK: 84 U/L (ref 49–397)

## 2021-11-26 LAB — CBG MONITORING, ED
Glucose-Capillary: 85 mg/dL (ref 70–99)
Glucose-Capillary: 88 mg/dL (ref 70–99)
Glucose-Capillary: 137 mg/dL — ABNORMAL HIGH (ref 70–99)
Glucose-Capillary: 26 mg/dL — CL (ref 70–99)

## 2021-11-26 LAB — HEPATITIS B SURFACE ANTIGEN: Hepatitis B Surface Ag: NONREACTIVE

## 2021-11-26 LAB — GLUCOSE, CAPILLARY
Glucose-Capillary: 85 mg/dL (ref 70–99)
Glucose-Capillary: 95 mg/dL (ref 70–99)

## 2021-11-26 LAB — MAGNESIUM: Magnesium: 2.9 mg/dL — ABNORMAL HIGH (ref 1.7–2.4)

## 2021-11-26 MED ORDER — LORAZEPAM 2 MG/ML IJ SOLN
INTRAMUSCULAR | Status: AC
  Administered 2021-11-26: 15:00:00 1 mg
  Filled 2021-11-26: qty 1

## 2021-11-26 MED ORDER — TACROLIMUS 0.5 MG PO CAPS: 0.5000 mg | ORAL_CAPSULE | ORAL | Status: DC

## 2021-11-26 MED ORDER — ASPIRIN EC 81 MG PO TBEC
81.0000 mg | DELAYED_RELEASE_TABLET | Freq: Every day | ORAL | Status: DC
  Administered 2021-11-27 – 2021-11-29 (×3): 81 mg via ORAL
  Filled 2021-11-26 (×3): qty 1

## 2021-11-26 MED ORDER — LIDOCAINE HCL (PF) 1 % IJ SOLN: 5.0000 mL | INTRAMUSCULAR | Status: DC | PRN

## 2021-11-26 MED ORDER — SULFAMETHOXAZOLE-TRIMETHOPRIM 800-160 MG PO TABS
1.0000 | ORAL_TABLET | Freq: Every day | ORAL | Status: DC
  Administered 2021-11-28 – 2021-11-29 (×2): 1 via ORAL
  Filled 2021-11-26 (×2): qty 1

## 2021-11-26 MED ORDER — LANTHANUM CARBONATE 500 MG PO CHEW
1500.0000 mg | CHEWABLE_TABLET | Freq: Two times a day (BID) | ORAL | Status: DC
  Administered 2021-11-27 – 2021-11-29 (×4): 1500 mg via ORAL
  Filled 2021-11-26 (×6): qty 3

## 2021-11-26 MED ORDER — SODIUM CHLORIDE 0.9 % IV SOLN: 100.0000 mL | INTRAVENOUS | Status: DC | PRN

## 2021-11-26 MED ORDER — DEXTROSE 50 % IV SOLN
INTRAVENOUS | Status: AC
  Administered 2021-11-26: 15:00:00 50 mL via INTRAVENOUS
  Filled 2021-11-26: qty 100

## 2021-11-26 MED ORDER — INSULIN ASPART 100 UNIT/ML IV SOLN
5.0000 [IU] | Freq: Once | INTRAVENOUS | Status: AC
  Administered 2021-11-26: 14:00:00 5 [IU] via INTRAVENOUS

## 2021-11-26 MED ORDER — DEXTROSE 50 % IV SOLN
1.0000 | Freq: Once | INTRAVENOUS | Status: AC
  Administered 2021-11-26: 14:00:00 50 mL via INTRAVENOUS
  Filled 2021-11-26: qty 50

## 2021-11-26 MED ORDER — TACROLIMUS 1 MG PO CAPS
1.0000 mg | ORAL_CAPSULE | Freq: Every day | ORAL | Status: DC
  Administered 2021-11-27 – 2021-11-29 (×3): 1 mg via ORAL
  Filled 2021-11-26 (×3): qty 1

## 2021-11-26 MED ORDER — DOXERCALCIFEROL 4 MCG/2ML IV SOLN
2.0000 ug | INTRAVENOUS | Status: DC
  Filled 2021-11-26: qty 2

## 2021-11-26 MED ORDER — PENTAFLUOROPROP-TETRAFLUOROETH EX AERO
1.0000 "application " | INHALATION_SPRAY | CUTANEOUS | Status: DC | PRN
  Filled 2021-11-26: qty 116

## 2021-11-26 MED ORDER — SODIUM CHLORIDE 0.9% FLUSH: 3.0000 mL | INTRAVENOUS | Status: DC | PRN

## 2021-11-26 MED ORDER — SULFAMETHOXAZOLE-TRIMETHOPRIM 800-160 MG PO TABS: 1.0000 | ORAL_TABLET | Freq: Every day | ORAL | Status: DC

## 2021-11-26 MED ORDER — HEPARIN SODIUM (PORCINE) 5000 UNIT/ML IJ SOLN
5000.0000 [IU] | Freq: Three times a day (TID) | INTRAMUSCULAR | Status: DC
  Administered 2021-11-27 (×2): 5000 [IU] via SUBCUTANEOUS
  Filled 2021-11-26 (×4): qty 1

## 2021-11-26 MED ORDER — PRAVASTATIN SODIUM 10 MG PO TABS
20.0000 mg | ORAL_TABLET | Freq: Every day | ORAL | Status: DC
  Administered 2021-11-27 – 2021-11-29 (×3): 20 mg via ORAL
  Filled 2021-11-26 (×3): qty 2

## 2021-11-26 MED ORDER — LANTHANUM CARBONATE 500 MG PO CHEW: 3000.0000 mg | CHEWABLE_TABLET | Freq: Three times a day (TID) | ORAL | Status: DC

## 2021-11-26 MED ORDER — AZITHROMYCIN 250 MG PO TABS
250.0000 mg | ORAL_TABLET | ORAL | Status: DC
  Administered 2021-11-29: 10:00:00 250 mg via ORAL
  Filled 2021-11-26: qty 1

## 2021-11-26 MED ORDER — CALCIUM GLUCONATE 10 % IV SOLN
1.0000 g | Freq: Once | INTRAVENOUS | Status: AC
  Administered 2021-11-26: 14:00:00 1 g via INTRAVENOUS
  Filled 2021-11-26: qty 10

## 2021-11-26 MED ORDER — SODIUM CHLORIDE 0.9% FLUSH
3.0000 mL | Freq: Two times a day (BID) | INTRAVENOUS | Status: DC
  Administered 2021-11-27 – 2021-11-29 (×3): 3 mL via INTRAVENOUS

## 2021-11-26 MED ORDER — CHLORHEXIDINE GLUCONATE CLOTH 2 % EX PADS
6.0000 | MEDICATED_PAD | Freq: Every day | CUTANEOUS | Status: DC
  Administered 2021-11-27 – 2021-11-29 (×3): 6 via TOPICAL

## 2021-11-26 MED ORDER — ACETAMINOPHEN 325 MG PO TABS: 650.0000 mg | ORAL_TABLET | Freq: Four times a day (QID) | ORAL | Status: DC | PRN

## 2021-11-26 MED ORDER — LIDOCAINE-PRILOCAINE 2.5-2.5 % EX CREA
1.0000 "application " | TOPICAL_CREAM | CUTANEOUS | Status: DC | PRN
  Filled 2021-11-26: qty 5

## 2021-11-26 MED ORDER — FUROSEMIDE 10 MG/ML IJ SOLN
40.0000 mg | Freq: Once | INTRAMUSCULAR | Status: AC
  Administered 2021-11-26: 14:00:00 40 mg via INTRAVENOUS
  Filled 2021-11-26: qty 4

## 2021-11-26 MED ORDER — FAMOTIDINE 20 MG PO TABS
20.0000 mg | ORAL_TABLET | Freq: Every day | ORAL | Status: DC
Start: 2021-11-26 — End: 2021-11-29
  Administered 2021-11-27 – 2021-11-29 (×3): 20 mg via ORAL
  Filled 2021-11-26 (×3): qty 1

## 2021-11-26 MED ORDER — OSELTAMIVIR PHOSPHATE 30 MG PO CAPS
30.0000 mg | ORAL_CAPSULE | ORAL | Status: DC
  Administered 2021-11-27 – 2021-11-29 (×2): 30 mg via ORAL
  Filled 2021-11-26 (×2): qty 1

## 2021-11-26 MED ORDER — SODIUM CHLORIDE 0.9 % IV SOLN: 250.0000 mL | INTRAVENOUS | Status: DC | PRN

## 2021-11-26 MED ORDER — TACROLIMUS 0.5 MG PO CAPS
0.5000 mg | ORAL_CAPSULE | Freq: Every day | ORAL | Status: DC
  Administered 2021-11-26 – 2021-11-28 (×3): 0.5 mg via ORAL
  Filled 2021-11-26 (×4): qty 1

## 2021-11-26 MED ORDER — PREDNISONE 5 MG PO TABS
5.0000 mg | ORAL_TABLET | Freq: Every day | ORAL | Status: DC
  Filled 2021-11-26 (×2): qty 1

## 2021-11-26 MED ORDER — SODIUM ZIRCONIUM CYCLOSILICATE 10 G PO PACK
10.0000 g | PACK | Freq: Every day | ORAL | Status: DC
  Administered 2021-11-26: 13:00:00 10 g via ORAL
  Filled 2021-11-26: qty 1

## 2021-11-26 NOTE — Discharge Instructions (Signed)
80 year old male here with weakness, myoclonic jerking, hyperkalemia, recurrent hypoglycemia.  Patient was having increasing jerking movements.  We are called urgently into the room for assessment.  Patient appeared to be having a seizure.  CBG noted to be 26.  Patient given D50 with improvement in his mental status along with 1 mg Ativan and started on a D10 drip.

## 2021-11-26 NOTE — H&P (Addendum)
Date: 11/26/2021               Patient Name:  Tyler Mueller MRN: [id number]  DOB: 1941/10/07 Age / Sex: 80 y.o., male   PCP: Mikey College, MD         Medical Service: Internal Medicine Teaching Service         Attending Physician: Dr. Jimmye Norman, Elaina Pattee, MD    First Contact: Dr. Jeanice Lim Pager: [phone]  Second Contact: Dr. 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  Patient was somnolent during interview.  Wife present at bedside.  According to the wife, patient has been falling frequently for the past month.  Falls partially due to either leg weakness, low blood sugar, or mechanical falls.  Wife denies hypotension resulting in any falls.  Patient states he checks his blood pressure daily and he is normotensive.  Wife states they have seen other specialists to determine etiology of recurrent falls to no avail.  Patient has peripheral neuropathy secondary to diabetes and has balance issues at baseline.  Wife states the neuropathy has worsened recently and patient has been falling more frequently.  Patient uses sliding scale insulin and wife is unsure how much patient uses throughout the day.  But endorses low blood sugars sometimes following a fall.  Most recent fall was 1 week ago and patient went to urgent care for a scrape of his knees and elbow following the fall.  Imaging of the head was negative, wife denies any head injuries.  Wilburn Mylar, wife states that patient fell, he denied any prodromal symptoms and states that "his legs gave out".  She states patient reports "not feeling well" and missed dialysis.  Denies fever, chills,  chest pain, shortness of breath, vomiting or abdominal pain. However, endorsed jerking movements of bilateral upper arms, nausea and anorexia.  This morning, patient's wife found Tyler Mueller on the floor next to his bed.  Unknown how long he had been laying there.  Wife states that patient has rolled out of bed several times in the past.  Wife was unable to lift him up.  And called EMS for assistance.  EMS arrived and checked his blood glucose levels and they were low at 55.     ED course: On arrival, vitals significant for tachycardia, hypertension, heart rate and respiratory rate within normal limits and patient was satting well on 2 L nasal cannula.  Initial labs significant for CBG of 85; white count of 10.8; Elevated potassium levels at 7.2;Creatinine level elevated at 8.7; magnesium elevated 2.9; Rest panel positive for influenza A.  CT head no acute intracranial pathology; x-ray of the left elbow negative for any acute osseous abnormality; chest x-ray significant for cardiomegaly with evidence of prior CABG, low lung volumes without evidence of focal consolidation/pleural effusion.  Patient received insulin and D50 in the ED for hyperkalemia; however following administration patient's CBG dropped to 26 and patient seized.  Seizure was ceased with 1 dose of Ativan given another dose of D50.  Patient was stable and sleeping.  Nephrology was consulted and patient will receive dialysis treatment today.  Meds:  Current Meds  Medication Sig   acetaminophen (TYLENOL) 500 MG tablet Take 500 mg by mouth every 6 (six) hours as needed.   acyclovir (ZOVIRAX) 200 MG capsule Take 4 capsules (800 mg total) by mouth daily.   amLODipine (NORVASC) 5 MG tablet Take 5 mg by mouth daily.   aspirin EC 81 MG tablet Take 81 mg by mouth daily.   calcium acetate (PHOSLO) 667 MG capsule Take 667 mg by mouth 2 (two) times daily with a meal.   carvedilol (COREG) 25 MG tablet Take 25 mg by mouth 2 (two) times daily with a  meal.   doxazosin (CARDURA) 4 MG tablet Take 4 mg by mouth daily.   ferrous gluconate (FERGON) 225 (27 FE) MG tablet Take 240 mg by mouth 3 (three) times daily with meals.   insulin aspart (NOVOLOG FLEXPEN) 100 UNIT/ML FlexPen Inject 2-4 Units into the skin See admin instructions. Take 2 units at breakfast, 4 units at lunch and 2 units at dinner plus corrction scale insulin with meals: Glucose Range blood sugar <200 none, 201-250 mg dl 1 unit, 251-300 mg/dl 2 units, 301-350 mg/dl 3 units, 351-400 mg/ dl 4 units, >400 mg/dl take 5 units and call your provider Doctor's comments: ICD 10 code: E11.69   Multiple Vitamin (MULTIVITAMIN WITH MINERALS) TABS tablet Take 1 tablet by mouth daily.   multivitamin-lutein (OCUVITE-LUTEIN) CAPS capsule Take 1 capsule by mouth daily.   potassium chloride SA (K-DUR,KLOR-CON) 20 MEQ tablet Take 1 tablet (20 mEq total) by mouth daily.   pravastatin (PRAVACHOL) 40 MG tablet Take 20 mg by mouth daily.   predniSONE (DELTASONE) 5 MG tablet Take 5 mg by mouth 2 (two) times daily with a meal.   ranitidine (ZANTAC) 150 MG tablet Take 150 mg by mouth 2 (two) times daily.   sodium bicarbonate 650 MG tablet Take 1,300 mg by mouth 3 (three) times daily.   tacrolimus (PROGRAF) 0.5 MG capsule Take 0.5-1 mg by mouth See admin instructions. Take two 0.5 mg capsules(1.0mg ) by mouth in the AM and one 0.5 mg (0.5mg ) capsules in the PM     Allergies: Allergies as of 11/26/2021 - Review Complete 11/26/2021  Allergen Reaction Noted   Morphine and related Nausea And Vomiting and Other (See Comments) 04/20/2014   Penicillins Nausea And Vomiting and Other (See Comments)    Past Medical History:  Diagnosis Date   Chronic kidney disease    RENAL INSUFFICIENCY   Diverticulitis    History of lung transplant ([place]) 04/21/2014   Hypertension    Lung transplanted ([place]) 2004   BILATERAL    Muscle spasm of back 04/21/2014   Skin cancer of face     Family History: History reviewed. No  pertinent family history.   Social History: Patient lives with his wife.  Has a remote history of smoking a pack and half cigarettes per day for 20 years and a remote drinking history consuming a sixpack of beers per day.  Patient has not smoked or drink in the last 18 years, per wife.  Denies any illicit drug use.  Review of Systems: A complete ROS was negative except as per HPI.   Physical Exam: Blood pressure 127/60, pulse 90, temperature 99.6 F (37.6 C), temperature source Oral, resp. rate 19, height 5\' 8"  (1.727 m), weight 56.7 kg, SpO2 96 %. Physical Exam Constitutional:      General: He is sleeping.  HENT:     Head: Normocephalic and atraumatic.  Eyes:  General: Lids are normal.  Cardiovascular:     Rate and Rhythm: Normal rate and regular rhythm.     Pulses:          Dorsalis pedis pulses are 2+ on the right side and 2+ on the left side.       Posterior tibial pulses are 2+ on the right side and 2+ on the left side.     Heart sounds: Normal heart sounds.  Pulmonary:     Effort: Pulmonary effort is normal.     Breath sounds: Normal breath sounds and air entry.  Abdominal:     General: Abdomen is flat. Bowel sounds are normal.     Palpations: Abdomen is soft.  Musculoskeletal:     Right lower leg: No edema.     Left lower leg: No edema.  Skin:    General: Skin is warm and dry.  Neurological:     Comments: Patient was sleeping and difficult to arouse.     EKG: personally reviewed my interpretation is Sinus rhythm with 1st degree A-V block, Left bundle branch block, Peaked t waves, Prolonged QT  DG Chest 1 View  Result Date: 11/26/2021 CLINICAL DATA:  Fall due to hypoglycemia. EXAM: CHEST  1 VIEW COMPARISON:  Chest radiograph dated October 14, 2021. FINDINGS: The heart is enlarged. Low sternotomy changes and mediastinal surgical clips. Low lung volumes without evidence of focal consolidation or pleural effusion. IMPRESSION: 1.  Cardiomegaly with evidence of prior  CABG. 2. Low lung volumes without evidence of focal consolidation or pleural effusion. Electronically Signed   By: Keane Police D.O.   On: 11/26/2021 13:15   DG Elbow Complete Left  Result Date: 11/26/2021 CLINICAL DATA:  Fall, elbow pain, initial encounter. EXAM: LEFT ELBOW - COMPLETE 3+ VIEW COMPARISON:  None. FINDINGS: No acute osseous abnormality. IMPRESSION: No acute osseous abnormality. Electronically Signed   By: Lorin Picket M.D.   On: 11/26/2021 13:11   CT Head Wo Contrast  Result Date: 11/26/2021 CLINICAL DATA:  Mental status change EXAM: CT HEAD WITHOUT CONTRAST TECHNIQUE: Contiguous axial images were obtained from the base of the skull through the vertex without intravenous contrast. COMPARISON:  10/14/2021 FINDINGS: Brain: No evidence of acute infarction, hemorrhage, extra-axial collection, ventriculomegaly, or mass effect. Chronic right basal ganglia lacunar infarct. Generalized cerebral atrophy. Periventricular white matter low attenuation likely secondary to microangiopathy. Vascular: Cerebrovascular atherosclerotic calcifications are noted. Skull: Negative for fracture or focal lesion. Sinuses/Orbits: Visualized portions of the orbits are unremarkable. Visualized portions of the paranasal sinuses are unremarkable. Visualized portions of the mastoid air cells are unremarkable. Other: None. IMPRESSION: 1.  No acute intracranial pathology. 2. Chronic microvascular disease and cerebral atrophy. Electronically Signed   By: Kathreen Devoid M.D.   On: 11/26/2021 12:32     Assessment & Plan by Problem: Principal Problem:   Hyperkalemia  #Hyperkalemia #ESRD on HD TTS Patient missed hemodialysis on Thursday due to not feeling well, according to the wife.  Throughout the day yesterday patient was experiencing bilateral jerking movements of the hands.  The next morning, patient was found down next to his bed by his wife and EMS was called.  Patient was found to be hypoglycemic.  On arrival to  the ED, glucose level was within normal range and he was found to be hyperkalemic at 7.2.  EKG significant for peaked T waves.  Likely his jerking movements yesterday was a consequence of electrolyte imbalance.  Patient received insulin and D50 in the ED, CBG  is rechecked found to be hypoglycemic at 26 resulting in seizure lasting 1 to 2 minutes.  Seizure ceased with a dose of Ativan and patient received another dose of D50. Nephrology was consulted and recommended immediate hemodialysis.  On exam patient was sleeping, likely due to receiving Ativan. Wife was at bedside and provided collateral. --Cardiac monitoring --Lokelma 10 g daily --Continue Fosrenol 5000 mg twice daily --Diet n.p.o. --Trend BMP --CK pending  #Influenza A According to the wife, for the last 2 days patient reports lack of appetite, nausea and "not feeling well" generally.  Denies any recent sick contacts.  Denies fevers or chills.  Patient is vaccinated with flu shot and COVID.  On exam, patient appeared warm and clammy.  Patient was sleeping and difficult to arouse.  However, vitals reveal patient is afebrile.  RR and HR within normal limits. Patient is saturating well on room air.  Lungs clear on auscultation.  Chest x-ray showed no evidence of consolidation or pleural effusion.  -- Tamiflu started today --Tylenol 650 Q6H as needed for mild pain and fever --Supportive care --Monitor for oxygen requirement or worsening of symptoms  #Bilateral lung transplant on tacrolimus Patient received a bilateral lung transplant transplant 8 years ago and has been on tacrolimus.  Lungs clear to auscultation.  Tacrolimus is nephrotoxic, however patient requires this medication for immunosuppression.  Patient is at risk for adrenal insufficiency due to prolonged use of steroids.  If patient becomes hypotensive, increase prednisone dose. --Continue tacrolimus daily --Continue Bactrim 800-160 mg daily --Azithromycin 250 mg every  MWF --Prednisone 5 mg daily starting tomorrow  #Type 2 diabetes with peripheral neuropathy Patient currently on sliding scale insulin.  According to the wife she is unsure how much insulin he uses throughout the day.  Recently patient has been experiencing hypoglycemia intermittently over the course of 1 month resulting in falls.  Most recent, this morning patient was found down and he was hypoglycemic resulting in ED visit.  According to the wife, patient has had worsening of his neuropathy resulting in falls as well.  Patient states it feels like his legs "give out".  On exam, bruising noted of the first 3 toes on the left foot but no wounds noted of the bilateral feet.  DP and PT pulses intact.  Due to patient being asleep unable to determine proprioception or sensation. --Sliding scale held in the setting of hypoglycemia --CBG monitoring every hour; offer food and juice if hypoglycemic --Diet n.p.o.  #Hypertension #HLD --Continue pravastatin 10 mg daily. --Continue aspirin 81 mg daily   Dispo: Admit patient to Inpatient with expected length of stay greater than 2 midnights.  Signed: Timothy Lasso, MD 11/26/2021, 4:54 PM  Pager: [phone] After 5pm on weekdays and 1pm on weekends: On Call pager: [phone]

## 2021-11-26 NOTE — Hospital Course (Signed)
Missed HD yesterday usually TTS. Has not hit is head. Has been having many recent falls. On sliding scale. Some times falls associated with low sugars. Falls due to leg weakness and falls suddenly. BP has been ok denies lows. Tends to be below 150s. Did not eat yesterday. Fell yesterday during breakfast. Fall attributated neuropathy.  Denies fevers, chills.  Had some nausea yesterday. Has not been short of breath. No N/V/D. No chest pain. Got flu shot.   Used to smoke 1.5 pack a day for 25 No drinking after transplant 18 years ago Used to drink a 6 pack a day.

## 2021-11-26 NOTE — ED Provider Notes (Signed)
[facility] EMERGENCY DEPARTMENT Provider Note   CSN: [id number] Arrival date & time: 11/26/21  1129     History Chief Complaint  Patient presents with   Hypoglycemia    Tyler Mueller is a 80 y.o. male with a past medical history of lung transplant, end-stage renal disease on hemodialysis Tuesday Thursday Saturday with last full dialysis this past Tuesday.  His wife reports that yesterday he began becoming more weak having balance issues and having to use his walker.  This morning she found him on the floor by his bed with diffuse myoclonic jerking.  She called 911.  He was found to have a blood sugar of 55 and was given both orange juice and oral glucose prior to arrival.   Hypoglycemia     Past Medical History:  Diagnosis Date   Chronic kidney disease    RENAL INSUFFICIENCY   Diverticulitis    History of lung transplant ([place]) 04/21/2014   Hypertension    Lung transplanted ([place]) 2004   BILATERAL    Muscle spasm of back 04/21/2014   Skin cancer of face     Patient Active Problem List   Diagnosis Date Noted   Clostridium difficile infection 04/27/2014   Herpes zoster 04/23/2014   Muscle spasm of back 04/21/2014   History of lung transplant ([place]) 04/21/2014   Diverticulitis 04/20/2014   CKD (chronic kidney disease) stage 5, GFR less than 15 ml/min ([place]) 04/20/2014   DIABETES MELLITUS 01/04/2008   DYSLIPIDEMIA 01/04/2008   IMMUNOCOMPROMISED 01/04/2008   BRONCHIECTASIS 01/04/2008   DYSPNEA ON EXERTION 13/24/4010   Complications of transplanted lung 12/10/2007    Past Surgical History:  Procedure Laterality Date   APPENDECTOMY     CATARACT EXTRACTION     LUNG TRANSPLANT, DOUBLE  2004       History reviewed. No pertinent family history.  Social History   Tobacco Use   Smoking status: Former    Types: Cigarettes    Quit date: 04/22/2003    Years since quitting: 18.6   Smokeless tobacco: Never  Substance Use Topics   Alcohol use: No     Comment: QUIT ALCOHOL 2004   Drug use: No    Home Medications Prior to Admission medications   Medication Sig Start Date End Date Taking? Authorizing Provider  acyclovir (ZOVIRAX) 200 MG capsule Take 4 capsules (800 mg total) by mouth daily. 04/27/14   Jerrye Noble, MD  amLODipine (NORVASC) 5 MG tablet Take 5 mg by mouth daily.    [provider]  aspirin EC 81 MG tablet Take 81 mg by mouth daily.    [provider]  calcium acetate (PHOSLO) 667 MG capsule Take 667 mg by mouth 2 (two) times daily with a meal.    [provider]  carvedilol (COREG) 25 MG tablet Take 25 mg by mouth 2 (two) times daily with a meal.    [provider]  doxazosin (CARDURA) 4 MG tablet Take 4 mg by mouth daily.    [provider]  ferrous gluconate (FERGON) 225 (27 FE) MG tablet Take 240 mg by mouth 3 (three) times daily with meals.    [provider]  glipiZIDE (GLUCOTROL) 5 MG tablet Take 5 mg by mouth daily before breakfast.    [provider]  insulin aspart (NOVOLOG FLEXPEN) 100 UNIT/ML FlexPen Inject 2-4 Units into the skin See admin instructions. Take 2 units at breakfast, 4 units at lunch and 2 units at dinner plus  corrction scale insulin with meals: Glucose Range blood sugar <200 none, 201-250 mg dl 1 unit, 251-300 mg/dl 2 units, 301-350 mg/dl 3 units, 351-400 mg/ dl 4 units, >400 mg/dl take 5 units and call your provider Doctor's comments: ICD 10 code: E11.69    [provider]  Multiple Vitamin (MULTIVITAMIN WITH MINERALS) TABS tablet Take 1 tablet by mouth daily.    [provider]  ondansetron (ZOFRAN) 4 MG tablet Take 1 tablet (4 mg total) by mouth every 6 (six) hours as needed for nausea or vomiting. 9/38/10   Delora Fuel, MD  potassium chloride SA (K-DUR,KLOR-CON) 20 MEQ tablet Take 1 tablet (20 mEq total) by mouth daily. 1/75/10   Delora Fuel, MD  pravastatin (PRAVACHOL) 40 MG tablet Take 20 mg by mouth daily.     [provider]  predniSONE (DELTASONE) 5 MG tablet Take 5 mg by mouth 2 (two) times daily with a meal.    [provider]  ranitidine (ZANTAC) 150 MG tablet Take 150 mg by mouth 2 (two) times daily.    [provider]  sodium bicarbonate 650 MG tablet Take 1,300 mg by mouth 3 (three) times daily.    [provider]  sulfamethoxazole-trimethoprim (BACTRIM,SEPTRA) 400-80 MG per tablet Take 1 tablet by mouth See admin instructions. Take 1 tablet by mouth nightly every Tuesday, Thursday, and Saturday take after dialysis. Take for Pneumocystis jiroveci pneumonia prevention.    [provider]  tacrolimus (PROGRAF) 0.5 MG capsule Take 0.5-1 mg by mouth See admin instructions. Take two 0.5 mg capsules(1.0mg ) by mouth in the AM and one 0.5 mg (0.5mg ) capsules in the PM    [provider]  vancomycin (VANCOCIN) 50 mg/mL oral solution Take 2.5 mLs (125 mg total) by mouth every 6 (six) hours. 04/28/14   Jerrye Noble, MD    Allergies    Morphine and related and Penicillins  Review of Systems   Review of Systems Ten systems reviewed and are negative for acute change, except as noted in the HPI.   Physical Exam Updated Vital Signs BP (!) 150/56 (BP Location: Right Arm)    Pulse (!) 102    Temp 99.6 F (37.6 C) (Oral)    Resp 18    Ht 5\' 8"  (1.727 m)    Wt 56.7 kg    SpO2 98%    BMI 19.01 kg/m   Physical Exam Vitals and nursing note reviewed.  Constitutional:      General: He is not in acute distress.    Appearance: He is well-developed. He is not diaphoretic.  HENT:     Head: Normocephalic and atraumatic.  Eyes:     General: No visual field deficit or scleral icterus.    Conjunctiva/sclera: Conjunctivae normal.  Cardiovascular:     Rate and Rhythm: Normal rate and regular rhythm.     Heart sounds: Normal heart sounds.     Comments: Vascular access left lower extremity with palpable thrill Pulmonary:     Effort: Pulmonary effort is normal.  No respiratory distress.     Breath sounds: Normal breath sounds.  Abdominal:     Palpations: Abdomen is soft.     Tenderness: There is no abdominal tenderness.  Musculoskeletal:     Cervical back: Normal range of motion and neck supple.     Comments: Left elbow with swelling and bony tenderness is decreased range of motion.  Skin:    General: Skin is warm and dry.  Neurological:  Mental Status: He is lethargic.     GCS: GCS eye subscore is 3. GCS verbal subscore is 5. GCS motor subscore is 6.     Cranial Nerves: Cranial nerves 2-12 are intact.     Sensory: Sensation is intact.     Motor: Tremor present.     Comments: Patient with diffuse myoclonic jerking and tremor  Psychiatric:        Behavior: Behavior normal.    ED Results / Procedures / Treatments   Labs (all labs ordered are listed, but only abnormal results are displayed) Labs Reviewed  RESP PANEL BY RT-PCR (FLU A&B, COVID) ARPGX2  CBC WITH DIFFERENTIAL/PLATELET  COMPREHENSIVE METABOLIC PANEL  MAGNESIUM  LACTIC ACID, PLASMA  AMMONIA  CBG MONITORING, ED  I-STAT CHEM 8, ED  CBG MONITORING, ED    EKG None  Radiology No results found.  Procedures .Critical Care Performed by: Margarita Mail, PA-C Authorized by: Margarita Mail, PA-C   Critical care provider statement:    Critical care time (minutes):  60   Critical care time was exclusive of:  Separately billable procedures and treating other patients   Critical care was necessary to treat or prevent imminent or life-threatening deterioration of the following conditions:  Metabolic crisis, renal failure and respiratory failure   Critical care was time spent personally by me on the following activities:  Development of treatment plan with patient or surrogate, discussions with consultants, evaluation of patient's response to treatment, examination of patient, ordering and review of laboratory studies, ordering and review of radiographic studies, ordering and  performing treatments and interventions, pulse oximetry, re-evaluation of patient's condition and review of old charts   Medications Ordered in ED Medications - No data to display  ED Course  I have reviewed the triage vital signs and the nursing notes.  Pertinent labs & imaging results that were available during my care of the patient were reviewed by me and considered in my medical decision making (see chart for details).  Clinical Course as of 11/26/21 1558  Fri Nov 26, 2021  1303 CT Head Wo Contrast [AH]  1437 Influenza A By PCR(!): POSITIVE [AH]    Clinical Course User Index [AH] Margarita Mail, PA-C   MDM Rules/Calculators/A&P  80 year old male who presents with weakness, myoclonic jerking.  He has a past medical history of lung Transplant and end-stage renal disease.  Patient arrived with oxygen saturations low now on 2 L.  He has missed dialysis.  I reviewed the patient's labs which shows hyperkalemia of 7.2, corrected calcium is 7.9, BUN 52 elevated creatinine consistent with his baseline end-stage renal disease and anion gap likely due to his uremia. Immediate temporizing agents ordered.  Patient also having hypoglycemia.  He was given D50 upfront.  Patient CBC mildly elevated 10.8.  His respiratory panel is positive for influenza.  I reviewed patient's chest x-ray, left elbow, and head CT all of which show no acute abnormalities.  Unfortunately the patient had a third episode of hypoglycemia down to the level of 26 with acute seizure.  Patient's blood glucose was immediately corrected with D50 and a D10 drip was hung.  I consulted with nephrology who will admit the patient for emergent dialysis.  I spoke also with Dr. Leonel Ramsay about the patient's myoclonic jerking which he feels is metabolic in nature.  I also discussed the case with the internal medicine resident service who will admit the patient.  Patient is critically ill requiring multiple  interventions.    final Clinical  Impression(s) / ED Diagnoses Final diagnoses:  None    Rx / DC Orders ED Discharge Orders     None        Margarita Mail, PA-C 11/26/21 1604    Regan Lemming, MD 11/26/21 [phone]

## 2021-11-26 NOTE — ED Notes (Signed)
Verbal order from EDP to give 10% Dextrose at 100 ml/hr

## 2021-11-26 NOTE — ED Triage Notes (Addendum)
Pt BIBA from home. Pt's wife found him on the floor this morning. Pt's sugar was 55. Pt satting at 78% on fire arrival on 2L (baseline).   Pt states his "legs gave out on me", causing fall  Hx 2004 bilateral lung transplant ([facility])  180/72 FSBG 70 at 1006 94-96% on 4L.   Hx of "blue in arms" down.   FSBG at 90 after receiving OJ and sugar with EMS at 1045.

## 2021-11-26 NOTE — ED Notes (Signed)
RN was notified pt was twitching and possibly having a seizure. EDP notified and at bedside. Seizure protocol initiated. Pt currently on 2L O2. Suction at the beside. EDP ordered D50, 1 mg Ativan, and 10% Dextrose at 165ml/hr. Two RN, NT, EDP (PA & MD) at bedside

## 2021-11-26 NOTE — ED Notes (Signed)
Delay in IV, pt to imaging

## 2021-11-26 NOTE — Consult Note (Signed)
[facility] Renal Consultation Note    Indication for Consultation:  Management of ESRD/hemodialysis; anemia, hypertension/volume and secondary hyperparathyroidism PCP:  HPI: Tyler Mueller is a 80 y.o. male with ESRD on hemodialysis T,Th,S at [facility]. 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 CMV PNA, CAD, Hx UE DVT, Type 2 DM. He is compliant with HD, last HD 11/23/2021. Has not been getting to EDW-leaving 1.5-2 kgs above EDW.   Per wife patient started to C/O of sore throat Wednesday. Said he was afebrile. Didn't feel like he could go to HD 11/25/2021. Was very weak,has been falling at home, not eating. This AM she said he fell out of bed and she was unable to get him off floor.  She called EMS for assistance. Upon arrival EMS found BS to be 55. O2 sats were 78%. He was given glucose, started on O2 and transported to [facility] for evaluation. Patient usually goes to [facility] for care. Wife says she called transplant coordinator who told her to bring him to [facility] for evaluation. Unfortunately patient does not have "Care Everywhere".   Patient is positive for influenza A, negative for COVID 19. K+ noted to be 7.2 on arrival to ED. BS 62 SCr 8.77 BUN 52 Co2 24 WBC 10.8 HGB 11.9. CT of head unremarkable. CXR unremarkable. While holding in ED, he was noted to have seizure type activity. BS 26. He received D50W, 1 mg Ativan IV, started D10W at 100 cc/hr. Repeat BS 136. Seen in ED. Patient is sedated, not responding to verbal or tactile stimuli. HPI gather from wife, ED staff and EMR.   Will have urgent HD for hemodialysis. ED PA has called for hospitalist to admit, has contacted neurology regarding seizure activity/myoclonus.    Past Medical History:  Diagnosis Date   Chronic kidney disease    RENAL INSUFFICIENCY   Diverticulitis    History of lung transplant ([place]) 04/21/2014   Hypertension    Lung transplanted ([place]) 2004    BILATERAL    Muscle spasm of back 04/21/2014   Skin cancer of face    Past Surgical History:  Procedure Laterality Date   APPENDECTOMY     CATARACT EXTRACTION     LUNG TRANSPLANT, DOUBLE  2004   History reviewed. No pertinent family history. Social History:  reports that he quit smoking about 18 years ago. He has never used smokeless tobacco. He reports that he does not drink alcohol and does not use drugs. Allergies  Allergen Reactions   Morphine And Related Nausea And Vomiting and Other (See Comments)    Hallucinations    Penicillins Nausea And Vomiting and Other (See Comments)    Hallucinations    Prior to Admission medications   Medication Sig Start Date End Date Taking? Authorizing Provider  acetaminophen (TYLENOL) 500 MG tablet Take 500 mg by mouth every 6 (six) hours as needed.   Yes [provider]  acyclovir (ZOVIRAX) 200 MG capsule Take 4 capsules (800 mg total) by mouth daily. 04/27/14  Yes Jerrye Noble, MD  amLODipine (NORVASC) 5 MG tablet Take 5 mg by mouth daily.   Yes [provider]  aspirin EC 81 MG tablet Take 81 mg by mouth daily.   Yes [provider]  calcium acetate (PHOSLO) 667 MG capsule Take 667 mg by mouth 2 (two) times daily with a meal.   Yes [provider]  carvedilol (COREG) 25 MG tablet Take 25  mg by mouth 2 (two) times daily with a meal.   Yes [provider]  doxazosin (CARDURA) 4 MG tablet Take 4 mg by mouth daily.   Yes [provider]  ferrous gluconate (FERGON) 225 (27 FE) MG tablet Take 240 mg by mouth 3 (three) times daily with meals.   Yes [provider]  insulin aspart (NOVOLOG FLEXPEN) 100 UNIT/ML FlexPen Inject 2-4 Units into the skin See admin instructions. Take 2 units at breakfast, 4 units at lunch and 2 units at dinner plus corrction scale insulin with meals: Glucose Range blood sugar <200 none, 201-250 mg dl 1 unit, 251-300 mg/dl 2 units, 301-350 mg/dl 3 units, 351-400 mg/ dl  4 units, >400 mg/dl take 5 units and call your provider Doctor's comments: ICD 10 code: E11.69   Yes [provider]  Multiple Vitamin (MULTIVITAMIN WITH MINERALS) TABS tablet Take 1 tablet by mouth daily.   Yes [provider]  multivitamin-lutein (OCUVITE-LUTEIN) CAPS capsule Take 1 capsule by mouth daily.   Yes [provider]  potassium chloride SA (K-DUR,KLOR-CON) 20 MEQ tablet Take 1 tablet (20 mEq total) by mouth daily. 2/84/13  Yes Delora Fuel, MD  pravastatin (PRAVACHOL) 40 MG tablet Take 20 mg by mouth daily.   Yes [provider]  predniSONE (DELTASONE) 5 MG tablet Take 5 mg by mouth 2 (two) times daily with a meal.   Yes [provider]  ranitidine (ZANTAC) 150 MG tablet Take 150 mg by mouth 2 (two) times daily.   Yes [provider]  sodium bicarbonate 650 MG tablet Take 1,300 mg by mouth 3 (three) times daily.   Yes [provider]  tacrolimus (PROGRAF) 0.5 MG capsule Take 0.5-1 mg by mouth See admin instructions. Take two 0.5 mg capsules(1.0mg ) by mouth in the AM and one 0.5 mg (0.5mg ) capsules in the PM   Yes [provider]  ondansetron (ZOFRAN) 4 MG tablet Take 1 tablet (4 mg total) by mouth every 6 (six) hours as needed for nausea or vomiting. Patient not taking: Reported on 24/40/1027 2/53/66   Delora Fuel, MD  sulfamethoxazole-trimethoprim (BACTRIM,SEPTRA) 400-80 MG per tablet Take 1 tablet by mouth See admin instructions. Take 1 tablet by mouth nightly every Tuesday, Thursday, and Saturday take after dialysis. Take for Pneumocystis jiroveci pneumonia prevention. Patient not taking: Reported on 11/26/2021    [provider]  vancomycin (VANCOCIN) 50 mg/mL oral solution Take 2.5 mLs (125 mg total) by mouth every 6 (six) hours. Patient not taking: Reported on 11/26/2021 04/28/14   Jerrye Noble, MD   Current Facility-Administered Medications  Medication Dose Route Frequency Provider Last Rate Last  Admin   [START ON 11/27/2021] Chlorhexidine Gluconate Cloth 2 % PADS 6 each  6 each Topical Q0600 Valentina Gu, NP       doxercalciferol (HECTOROL) injection 2 mcg  2 mcg Intravenous Q T,Th,Sa-HD Valentina Gu, NP       sodium zirconium cyclosilicate (LOKELMA) packet 10 g  10 g Oral Daily Regan Lemming, MD   10 g at 11/26/21 1326   Current Outpatient Medications  Medication Sig Dispense Refill   acetaminophen (TYLENOL) 500 MG tablet Take 500 mg by mouth every 6 (six) hours as needed.     acyclovir (ZOVIRAX) 200 MG capsule Take 4 capsules (800 mg total) by mouth daily. 28 capsule 0   amLODipine (NORVASC) 5 MG tablet Take 5 mg by mouth daily.     aspirin EC 81 MG tablet Take  81 mg by mouth daily.     calcium acetate (PHOSLO) 667 MG capsule Take 667 mg by mouth 2 (two) times daily with a meal.     carvedilol (COREG) 25 MG tablet Take 25 mg by mouth 2 (two) times daily with a meal.     doxazosin (CARDURA) 4 MG tablet Take 4 mg by mouth daily.     ferrous gluconate (FERGON) 225 (27 FE) MG tablet Take 240 mg by mouth 3 (three) times daily with meals.     insulin aspart (NOVOLOG FLEXPEN) 100 UNIT/ML FlexPen Inject 2-4 Units into the skin See admin instructions. Take 2 units at breakfast, 4 units at lunch and 2 units at dinner plus corrction scale insulin with meals: Glucose Range blood sugar <200 none, 201-250 mg dl 1 unit, 251-300 mg/dl 2 units, 301-350 mg/dl 3 units, 351-400 mg/ dl 4 units, >400 mg/dl take 5 units and call your provider Doctor's comments: ICD 10 code: E11.69     Multiple Vitamin (MULTIVITAMIN WITH MINERALS) TABS tablet Take 1 tablet by mouth daily.     multivitamin-lutein (OCUVITE-LUTEIN) CAPS capsule Take 1 capsule by mouth daily.     potassium chloride SA (K-DUR,KLOR-CON) 20 MEQ tablet Take 1 tablet (20 mEq total) by mouth daily. 5 tablet 0   pravastatin (PRAVACHOL) 40 MG tablet Take 20 mg by mouth daily.     predniSONE (DELTASONE) 5 MG tablet Take 5 mg by mouth 2  (two) times daily with a meal.     ranitidine (ZANTAC) 150 MG tablet Take 150 mg by mouth 2 (two) times daily.     sodium bicarbonate 650 MG tablet Take 1,300 mg by mouth 3 (three) times daily.     tacrolimus (PROGRAF) 0.5 MG capsule Take 0.5-1 mg by mouth See admin instructions. Take two 0.5 mg capsules(1.0mg ) by mouth in the AM and one 0.5 mg (0.5mg ) capsules in the PM     ondansetron (ZOFRAN) 4 MG tablet Take 1 tablet (4 mg total) by mouth every 6 (six) hours as needed for nausea or vomiting. (Patient not taking: Reported on 11/26/2021) 20 tablet 0   sulfamethoxazole-trimethoprim (BACTRIM,SEPTRA) 400-80 MG per tablet Take 1 tablet by mouth See admin instructions. Take 1 tablet by mouth nightly every Tuesday, Thursday, and Saturday take after dialysis. Take for Pneumocystis jiroveci pneumonia prevention. (Patient not taking: Reported on 11/26/2021)     vancomycin (VANCOCIN) 50 mg/mL oral solution Take 2.5 mLs (125 mg total) by mouth every 6 (six) hours. (Patient not taking: Reported on 11/26/2021) 140 mL 0   Labs: Basic Metabolic Panel: Recent Labs  Lab 11/26/21 1251 11/26/21 1303  NA 138 135  K 7.2* 7.2*  CL 96* 101  CO2 24  --   GLUCOSE 62* 62*  BUN 52* 48*  CREATININE 8.77* 8.60*  CALCIUM 7.4*  --    Liver Function Tests: Recent Labs  Lab 11/26/21 1251  AST 24  ALT 14  ALKPHOS 80  BILITOT 0.8  PROT 6.1*  ALBUMIN 3.4*   No results for input(s): LIPASE, AMYLASE in the last 168 hours. Recent Labs  Lab 11/26/21 1202  AMMONIA 24   CBC: Recent Labs  Lab 11/26/21 1251 11/26/21 1303  WBC 10.8*  --   NEUTROABS 8.4*  --   HGB 11.9* 13.6  HCT 39.0 40.0  MCV 95.6  --   PLT 164  --    Cardiac Enzymes: No results for input(s): CKTOTAL, CKMB, CKMBINDEX, TROPONINI in the last 168 hours. CBG: Recent Labs  Lab 11/26/21 1142 11/26/21 1406 11/26/21 1442 11/26/21 1505  GLUCAP 85 88 26* 137*   Iron Studies: No results for input(s): IRON, TIBC, TRANSFERRIN, FERRITIN in  the last 72 hours. Studies/Results: DG Chest 1 View  Result Date: 11/26/2021 CLINICAL DATA:  Fall due to hypoglycemia. EXAM: CHEST  1 VIEW COMPARISON:  Chest radiograph dated October 14, 2021. FINDINGS: The heart is enlarged. Low sternotomy changes and mediastinal surgical clips. Low lung volumes without evidence of focal consolidation or pleural effusion. IMPRESSION: 1.  Cardiomegaly with evidence of prior CABG. 2. Low lung volumes without evidence of focal consolidation or pleural effusion. Electronically Signed   By: Keane Police D.O.   On: 11/26/2021 13:15   DG Elbow Complete Left  Result Date: 11/26/2021 CLINICAL DATA:  Fall, elbow pain, initial encounter. EXAM: LEFT ELBOW - COMPLETE 3+ VIEW COMPARISON:  None. FINDINGS: No acute osseous abnormality. IMPRESSION: No acute osseous abnormality. Electronically Signed   By: Lorin Picket M.D.   On: 11/26/2021 13:11   CT Head Wo Contrast  Result Date: 11/26/2021 CLINICAL DATA:  Mental status change EXAM: CT HEAD WITHOUT CONTRAST TECHNIQUE: Contiguous axial images were obtained from the base of the skull through the vertex without intravenous contrast. COMPARISON:  10/14/2021 FINDINGS: Brain: No evidence of acute infarction, hemorrhage, extra-axial collection, ventriculomegaly, or mass effect. Chronic right basal ganglia lacunar infarct. Generalized cerebral atrophy. Periventricular white matter low attenuation likely secondary to microangiopathy. Vascular: Cerebrovascular atherosclerotic calcifications are noted. Skull: Negative for fracture or focal lesion. Sinuses/Orbits: Visualized portions of the orbits are unremarkable. Visualized portions of the paranasal sinuses are unremarkable. Visualized portions of the mastoid air cells are unremarkable. Other: None. IMPRESSION: 1.  No acute intracranial pathology. 2. Chronic microvascular disease and cerebral atrophy. Electronically Signed   By: Kathreen Devoid M.D.   On: 11/26/2021 12:32    ROS: As per  HPI otherwise negative.  Physical Exam: Vitals:   11/26/21 1339 11/26/21 1400 11/26/21 1430 11/26/21 1500  BP: (!) 188/67 (!) 141/70 130/80 (!) 138/58  Pulse: (!) 110 (!) 115 (!) 108 (!) 105  Resp: 20 19 19 20   Temp:      TempSrc:      SpO2: 99% 97% 96% 97%  Weight:      Height:         General: Elderly male in NAD.  Head: Normocephalic, atraumatic, sclera non-icteric, mucus membranes are moist Neck: Supple. JVD not elevated. Lungs: Clear bilaterally to auscultation without wheezes, rales, or rhonchi. Breathing is unlabored. Heart: Tachy, S1,S2 RRR No R/G. ST on monitor. HR 110-120.  Abdomen: Soft, non-tender, non-distended with normoactive bowel sounds. No rebound/guarding. No obvious abdominal masses. Lower extremities:without edema or ischemic changes, no open wounds  Neuro: Sedated at present. Not responding to verbal stimuli.  Dialysis Access: L AVF + T/B  Dialysis Orders: [place] T,Th,S 3.5 hrs 160NRe 350/600 54 kg 2.0K/2.0 Ca AVF -Heparin -None -Hectorol 2 mcg IV TIW -Mircera 225 mcg IV q 2 weeks (last dose 11/23/2021)  Assessment/Plan:  Influenza A: Per primary. If he receives Tamiflu, please renal dose medication.  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 Per primary. Neurology has been notified.  Hyperkalemia in setting of missed HD: Urgent HD today. Use 1.0 k bath for one hour then switch to 2.0 K bath and finish HD. Noted oral K+ supplement on OP med list. This is inappropriate for HD patient. Will dc.  Hypoglycemia: Continue d10w at 50cc/hr for now. Per  primary H/O Bilateral lung transplant: Follow at [facility]. On pred/tacrolimus. 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 HD today off schedule and again 11/27/21 to resume schedule.   Hypertension/volume  -BP higher on arrival to ED but received ativan, now appears controlled. Usually on amlodipine 5 mg PO q HS, Carvedilol 25 mg PO BID, Doxazosin 4 mg PO  daily, . HR elevated, actually appears on dry side. Minimal UF with HD today.   Anemia  - HGB 11.9. Recent max ESA dose 11/23/21.   Metabolic bone disease -  C Ca+ 8.2 Continue VDRA, Fosrenol binders.   Nutrition - Albumin 3.6. NPO at present. DMT2-per primary  Jimmye Norman. Owens Shark, NP-C 11/26/2021, 3:29 PM  [employer] [phone]

## 2021-11-27 DIAGNOSIS — E162 Hypoglycemia, unspecified: Secondary | ICD-10-CM

## 2021-11-27 DIAGNOSIS — J9601 Acute respiratory failure with hypoxia: Secondary | ICD-10-CM

## 2021-11-27 DIAGNOSIS — D849 Immunodeficiency, unspecified: Secondary | ICD-10-CM

## 2021-11-27 DIAGNOSIS — Z942 Lung transplant status: Secondary | ICD-10-CM

## 2021-11-27 DIAGNOSIS — R0902 Hypoxemia: Secondary | ICD-10-CM

## 2021-11-27 DIAGNOSIS — J101 Influenza due to other identified influenza virus with other respiratory manifestations: Secondary | ICD-10-CM

## 2021-11-27 DIAGNOSIS — E875 Hyperkalemia: Principal | ICD-10-CM

## 2021-11-27 DIAGNOSIS — R4182 Altered mental status, unspecified: Secondary | ICD-10-CM

## 2021-11-27 LAB — GLUCOSE, CAPILLARY
Glucose-Capillary: 105 mg/dL — ABNORMAL HIGH (ref 70–99)
Glucose-Capillary: 103 mg/dL — ABNORMAL HIGH (ref 70–99)
Glucose-Capillary: 106 mg/dL — ABNORMAL HIGH (ref 70–99)
Glucose-Capillary: 130 mg/dL — ABNORMAL HIGH (ref 70–99)
Glucose-Capillary: 181 mg/dL — ABNORMAL HIGH (ref 70–99)
Glucose-Capillary: 256 mg/dL — ABNORMAL HIGH (ref 70–99)
Glucose-Capillary: 301 mg/dL — ABNORMAL HIGH (ref 70–99)
Glucose-Capillary: 167 mg/dL — ABNORMAL HIGH (ref 70–99)
Glucose-Capillary: 122 mg/dL — ABNORMAL HIGH (ref 70–99)

## 2021-11-27 LAB — CBC
HCT: 39.5 % (ref 39.0–52.0)
Hemoglobin: 11.9 g/dL — ABNORMAL LOW (ref 13.0–17.0)
MCH: 28.3 pg (ref 26.0–34.0)
MCHC: 30.1 g/dL (ref 30.0–36.0)
MCV: 93.8 fL (ref 80.0–100.0)
Platelets: 154 10*3/uL (ref 150–400)
RBC: 4.21 MIL/uL — ABNORMAL LOW (ref 4.22–5.81)
RDW: 17.2 % — ABNORMAL HIGH (ref 11.5–15.5)
WBC: 7.1 10*3/uL (ref 4.0–10.5)
nRBC: 0.3 % — ABNORMAL HIGH (ref 0.0–0.2)

## 2021-11-27 LAB — RENAL FUNCTION PANEL
Albumin: 3 g/dL — ABNORMAL LOW (ref 3.5–5.0)
Anion gap: 11 (ref 5–15)
BUN: 13 mg/dL (ref 8–23)
CO2: 25 mmol/L (ref 22–32)
Calcium: 8 mg/dL — ABNORMAL LOW (ref 8.9–10.3)
Chloride: 95 mmol/L — ABNORMAL LOW (ref 98–111)
Creatinine, Ser: 4.18 mg/dL — ABNORMAL HIGH (ref 0.61–1.24)
GFR, Estimated: 14 mL/min — ABNORMAL LOW (ref >60)
Glucose, Bld: 92 mg/dL (ref 70–99)
Phosphorus: 3.7 mg/dL (ref 2.5–4.6)
Potassium: 3.8 mmol/L (ref 3.5–5.1)
Sodium: 131 mmol/L — ABNORMAL LOW (ref 135–145)

## 2021-11-27 LAB — HEMOGLOBIN A1C
Hgb A1c MFr Bld: 4.9 % (ref 4.8–5.6)
Mean Plasma Glucose: 93.93 mg/dL

## 2021-11-27 MED ORDER — ACETAMINOPHEN 325 MG PO TABS: 650.0000 mg | ORAL_TABLET | Freq: Four times a day (QID) | ORAL | Status: DC | PRN

## 2021-11-27 MED ORDER — PREDNISONE 5 MG PO TABS
15.0000 mg | ORAL_TABLET | Freq: Every day | ORAL | Status: DC
  Administered 2021-11-27 – 2021-11-28 (×2): 15 mg via ORAL
  Filled 2021-11-27 (×2): qty 1

## 2021-11-27 MED ORDER — DEXTROSE 10 % IV SOLN: INTRAVENOUS | Status: DC

## 2021-11-27 MED ORDER — INSULIN ASPART 100 UNIT/ML IJ SOLN
0.0000 [IU] | INTRAMUSCULAR | Status: DC
  Administered 2021-11-27: 17:00:00 4 [IU] via SUBCUTANEOUS
  Administered 2021-11-27 – 2021-11-28 (×2): 1 [IU] via SUBCUTANEOUS

## 2021-11-27 NOTE — Progress Notes (Signed)
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 Per primary. Neurology has been notified.  Hyperkalemia in setting of missed HD: s/p urgent HD 12/23-resolved, HD again today Hypoglycemia: On dextrose. Per primary H/O Bilateral lung transplant: Follow at [facility]. On pred/tacrolimus. 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 S/p urgent HD 12/23 for hyperkalemia. HD again today--back on TTS schedule  Hypertension/volume  -BP higher on arrival to ED but received ativan, now appears controlled. Usually on amlodipine 5 mg PO q HS, Carvedilol 25 mg PO BID, Doxazosin 4 mg PO daily, . HR elevated, actually appears on dry side. Minimal UF with HD today.   Anemia  - HGB 11.9. Recent max ESA dose 11/23/21.   Metabolic bone disease -  C Ca+ 8.2 Continue VDRA, Fosrenol binders.   Nutrition - Albumin 3.0-push protein. Recommend renal diet DMT2-per primary  Outpatient Dialysis Orders: [place] T,Th,S 3.5 hrs 160NRe 350/600 54 kg 2.0K/2.0 Ca AVF -Heparin -None -Hectorol 2 mcg IV TIW -Mircera 225 mcg IV q 2 weeks (last dose 11/23/2021)    Subjective:   No acute events. Awake now. Tolerated hd with net uf 1L yesterday, K down to 3.8 this am. Remains on d10   Objective:   BP 139/66 (BP Location: Right Arm)    Pulse 92    Temp 99.5 F (37.5 C) (Axillary)    Resp (!) 21    Ht 5\' 8"  (1.727 m)    Wt 54.1 kg    SpO2 94%    BMI 18.13 kg/m   Intake/Output Summary (Last 24 hours) at 11/27/2021 0919 Last data filed at 11/27/2021 0315 Gross per 24 hour  Intake 0 ml  Output 1000 ml  Net -1000 ml   Weight change:   Physical Exam: Gen:nad CVS:s1s2, rrr Resp:cta bl WVP:XTGG Ext:no edema Neuro: awake, myclonic jerking Dialysis access: LUE AVF +b/t  Imaging: DG Chest 1 View  Result Date: 11/26/2021 CLINICAL DATA:  Fall due to hypoglycemia.  EXAM: CHEST  1 VIEW COMPARISON:  Chest radiograph dated October 14, 2021. FINDINGS: The heart is enlarged. Low sternotomy changes and mediastinal surgical clips. Low lung volumes without evidence of focal consolidation or pleural effusion. IMPRESSION: 1.  Cardiomegaly with evidence of prior CABG. 2. Low lung volumes without evidence of focal consolidation or pleural effusion. Electronically Signed   By: Keane Police D.O.   On: 11/26/2021 13:15   DG Elbow Complete Left  Result Date: 11/26/2021 CLINICAL DATA:  Fall, elbow pain, initial encounter. EXAM: LEFT ELBOW - COMPLETE 3+ VIEW COMPARISON:  None. FINDINGS: No acute osseous abnormality. IMPRESSION: No acute osseous abnormality. Electronically Signed   By: Lorin Picket M.D.   On: 11/26/2021 13:11   CT Head Wo Contrast  Result Date: 11/26/2021 CLINICAL DATA:  Mental status change EXAM: CT HEAD WITHOUT CONTRAST TECHNIQUE: Contiguous axial images were obtained from the base of the skull through the vertex without intravenous contrast. COMPARISON:  10/14/2021 FINDINGS: Brain: No evidence of acute infarction, hemorrhage, extra-axial collection, ventriculomegaly, or mass effect. Chronic right basal ganglia lacunar infarct. Generalized cerebral atrophy. Periventricular white matter low attenuation likely secondary to microangiopathy. Vascular: Cerebrovascular atherosclerotic calcifications are noted. Skull: Negative for fracture or focal lesion. Sinuses/Orbits: Visualized portions of the orbits are unremarkable. Visualized portions of the paranasal sinuses are unremarkable. Visualized portions of the mastoid air cells are unremarkable. Other: None.  IMPRESSION: 1.  No acute intracranial pathology. 2. Chronic microvascular disease and cerebral atrophy. Electronically Signed   By: Kathreen Devoid M.D.   On: 11/26/2021 12:32    Labs: BMET Recent Labs  Lab 11/26/21 1251 11/26/21 1303 11/26/21 1642 11/26/21 2148 11/27/21 0217  NA 138 135 134* 134* 131*  K  7.2* 7.2* 6.6* 4.3 3.8  CL 96* 101 98 96* 95*  CO2 24  --  22 27 25   GLUCOSE 62* 62* 72 83 92  BUN 52* 48* 53* 14 13  CREATININE 8.77* 8.60* 8.85* 3.91* 4.18*  CALCIUM 7.4*  --  7.3* 8.0* 8.0*  PHOS  --   --   --   --  3.7   CBC Recent Labs  Lab 11/26/21 1251 11/26/21 1303 11/27/21 0217  WBC 10.8*  --  7.1  NEUTROABS 8.4*  --   --   HGB 11.9* 13.6 11.9*  HCT 39.0 40.0 39.5  MCV 95.6  --  93.8  PLT 164  --  154    Medications:     aspirin EC  81 mg Oral Daily   azithromycin  250 mg Oral Q M,W,F   Chlorhexidine Gluconate Cloth  6 each Topical Q0600   doxercalciferol  2 mcg Intravenous Q T,Th,Sa-HD   famotidine  20 mg Oral Daily   heparin  5,000 Units Subcutaneous Q8H   lanthanum  1,500 mg Oral BID WC   oseltamivir  30 mg Oral QODAY   pravastatin  20 mg Oral Daily   predniSONE  15 mg Oral Q breakfast   sodium chloride flush  3 mL Intravenous Q12H   [START ON 11/28/2021] sulfamethoxazole-trimethoprim  1 tablet Oral Daily   tacrolimus  0.5 mg Oral QHS   tacrolimus  1 mg Oral Daily      Gean Quint, MD [facility] 11/27/2021, 9:19 AM

## 2021-11-27 NOTE — Evaluation (Signed)
Occupational Therapy Evaluation Patient Details Name: Tyler Mueller MRN: [id number] DOB: Mar 20, 1941 Today's Date: 11/27/2021   History of Present Illness This 80 y.o. male admitted with weakness and recurrent falls. He was found to have influenza A, and with hyperkalemia (pt had missed HD session).  PMH includes: h/o bil lung transplant, ESRD on HD, DM, peripheral neuropathy, HTN   Clinical Impression   Patient admitted for the diagnosis above.  He has no memory of the fall or event surrounding this hospitalization.  PTA he lives with his spouse who is able to provide supportive assist as needed.  He generally uses an AD for mobility, can assist with light meal prep and home management, and is Mod I with ADL completion from a sit/stand level.  Deficits impacting independence are listed below.  Currently he is needing up to [place] for mobility and lower body ADL (balance support).  OT will follow in the acute setting, but no HH OT is anticipated.        Recommendations for follow up therapy are one component of a multi-disciplinary discharge planning process, led by the attending physician.  Recommendations may be updated based on patient status, additional functional criteria and insurance authorization.   Follow Up Recommendations  No OT follow up    Assistance Recommended at Discharge Set up Supervision/Assistance  Functional Status Assessment  Patient has had a recent decline in their functional status and demonstrates the ability to make significant improvements in function in a reasonable and predictable amount of time.  Equipment Recommendations  None recommended by OT    Recommendations for Other Services       Precautions / Restrictions Precautions Precautions: Fall Restrictions Weight Bearing Restrictions: No      Mobility Bed Mobility Overal bed mobility: Modified Independent                  Transfers Overall transfer level: Needs assistance Equipment  used: Rolling walker (2 wheels) Transfers: Sit to/from Stand;Bed to chair/wheelchair/BSC Sit to Stand: Min guard     Step pivot transfers: Min assist            Balance Overall balance assessment: Needs assistance Sitting-balance support: Feet supported Sitting balance-Leahy Scale: Good     Standing balance support: Reliant on assistive device for balance Standing balance-Leahy Scale: Poor                             ADL either performed or assessed with clinical judgement   ADL       Grooming: Wash/dry hands;Standing;Min guard       Lower Body Bathing: Minimal assistance;Sit to/from stand Lower Body Bathing Details (indicate cue type and reason): balance assist     Lower Body Dressing: Minimal assistance;Sit to/from stand   Toilet Transfer: Minimal assistance;Ambulation;Rolling walker (2 wheels)                   Vision Patient Visual Report: No change from baseline       Perception Perception Perception: Not tested   Praxis Praxis Praxis: Not tested    Pertinent Vitals/Pain Pain Assessment: No/denies pain     Hand Dominance Right   Extremity/Trunk Assessment Upper Extremity Assessment Upper Extremity Assessment: Overall WFL for tasks assessed   Lower Extremity Assessment Lower Extremity Assessment: Defer to PT evaluation   Cervical / Trunk Assessment Cervical / Trunk Assessment: Kyphotic   Communication Communication Communication: No difficulties   Cognition  Arousal/Alertness: Awake/alert Behavior During Therapy: WFL for tasks assessed/performed Overall Cognitive Status: No family/caregiver present to determine baseline cognitive functioning                                 General Comments: decreased problem solving noted, mild ST Memory deficits     General Comments       Exercises     Shoulder Instructions      Home Living Family/patient expects to be discharged to:: Private residence Living  Arrangements: Spouse/significant other Available Help at Discharge: Family;Available 24 hours/day Type of Home: House Home Access: Stairs to enter [employer] of Steps: 2 Entrance Stairs-Rails: Right Home Layout: One level     Bathroom Shower/Tub: Occupational psychologist: Handicapped height Bathroom Accessibility: Yes How Accessible: Accessible via walker Home Equipment: Rollator (4 wheels);Cane - single [employer]ista (2 wheels);Shower seat;Hand held shower head;Grab bars - tub/shower          Prior Functioning/Environment Prior Level of Function : Independent/Modified Independent             Mobility Comments: uses SPC as much as possibe, but uses 4WRW to dialysis ADLs Comments: Independent with bathing and dressing.  Patient still drives.  Helps a little with meals and home management.        OT Problem List: Decreased strength;Impaired balance (sitting and/or standing)      OT Treatment/Interventions: Self-care/ADL training;Therapeutic activities;Balance training;Patient/family education    OT Goals(Current goals can be found in the care plan section) Acute Rehab OT Goals Patient Stated Goal: Return home OT Goal Formulation: With patient Time For Goal Achievement: 12/11/21 Potential to Achieve Goals: Good  OT Frequency: Min 2X/week   Barriers to D/C:    none noted       Co-evaluation PT/OT/SLP Co-Evaluation/Treatment: Yes Reason for Co-Treatment: Complexity of the patient's impairments (multi-system involvement);Necessary to address cognition/behavior during functional activity;For patient/therapist safety;To address functional/ADL transfers          AM-PAC OT "6 Clicks" Daily Activity     Outcome Measure Help from another person eating meals?: None Help from another person taking care of personal grooming?: None Help from another person toileting, which includes using toliet, bedpan, or urinal?: A Little Help from another  person bathing (including washing, rinsing, drying)?: A Little Help from another person to put on and taking off regular upper body clothing?: None Help from another person to put on and taking off regular lower body clothing?: A Little 6 Click Score: 21   End of Session Equipment Utilized During Treatment: Gait belt;Rolling walker (2 wheels)  Activity Tolerance: Patient tolerated treatment well Patient left: in chair;with call bell/phone within reach;with chair alarm set  OT Visit Diagnosis: Unsteadiness on feet (R26.81)                Time: 1233-1300 OT Time Calculation (min): 27 min Charges:  OT General Charges $OT Visit: 1 Visit OT Evaluation $OT Eval Moderate Complexity: 1 Mod  11/27/2021  RP, OTR/L  Acute Rehabilitation Services  Office:  [phone]   Metta Clines 11/27/2021, 1:05 PM

## 2021-11-27 NOTE — Evaluation (Signed)
Physical Therapy Evaluation Patient Details Name: Tyler Mueller MRN: [id number] DOB: 08/03/1941 Today's Date: 11/27/2021  History of Present Illness  This 80 y.o. male admitted with weakness and recurrent falls. He was found to have influenza A, and with hyperkalemia (pt had missed HD session).  PMH includes: h/o bil lung transplant, ESRD on HD, DM, peripheral neuropathy, HTN   Clinical Impression  Pt in bed upon arrival of PT, agreeable to evaluation at this time. Prior to admission the pt was mobilizing household distances with use of SPC, but reports ~15 falls in last 6 months. The pt now presents with limitations in functional mobility, LE strength, power, and muscular endurance in addition to poor static and dynamic standing stability. All VSS with mobility, but pt needing minG to minA to steady with any standing activity. Recommend continued skilled PT to address high fall risk and LE strengthening.         Recommendations for follow up therapy are one component of a multi-disciplinary discharge planning process, led by the attending physician.  Recommendations may be updated based on patient status, additional functional criteria and insurance authorization.  Follow Up Recommendations Home health PT    Assistance Recommended at Discharge Frequent or constant Supervision/Assistance  Functional Status Assessment Patient has had a recent decline in their functional status and demonstrates the ability to make significant improvements in function in a reasonable and predictable amount of time.  Equipment Recommendations  None recommended by PT    Recommendations for Other Services       Precautions / Restrictions Precautions Precautions: Fall Precaution Comments: frequent falls (15 in last 6 months) Restrictions Weight Bearing Restrictions: No      Mobility  Bed Mobility Overal bed mobility: Modified Independent                  Transfers Overall transfer level:  Needs assistance Equipment used: Rolling walker (2 wheels) Transfers: Sit to/from Stand;Bed to chair/wheelchair/BSC Sit to Stand: Min guard   Step pivot transfers: Min assist       General transfer comment: pt unable to power up without use of UE, very poor activation and power in BLE. minG with use of UE    Ambulation/Gait Ambulation/Gait assistance: Min guard;Min assist Gait Distance (Feet): 35 Feet Assistive device: Rolling walker (2 wheels);1 person hand held assist Gait Pattern/deviations: Step-through pattern;Decreased stride length;Shuffle Gait velocity: decreased Gait velocity interpretation: <1.31 ft/sec, indicative of household ambulator   General Gait Details: small steps, minA to steady without BUE support, improved with RW      Balance Overall balance assessment: Needs assistance Sitting-balance support: Feet supported Sitting balance-Leahy Scale: Good     Standing balance support: Reliant on assistive device for balance Standing balance-Leahy Scale: Poor Standing balance comment: dependent on BUE support, minA with frequent LOB with single UE support                             Pertinent Vitals/Pain Pain Assessment: No/denies pain    Home Living Family/patient expects to be discharged to:: Private residence Living Arrangements: Spouse/significant other Available Help at Discharge: Family;Available 24 hours/day Type of Home: House Home Access: Stairs to enter Entrance Stairs-Rails: Right Entrance Stairs-Number of Steps: 2   Home Layout: One level Home Equipment: Rollator (4 wheels);Cane - single [employer]ista (2 wheels);Shower seat;Hand held shower head;Grab bars - tub/shower      Prior Function Prior Level of Function : Independent/Modified Independent  Mobility Comments: uses SPC as much as possibe, but uses 4WRW to dialysis ADLs Comments: Independent with bathing and dressing.  Patient still drives.  Helps a  little with meals and home management.     Hand Dominance   Dominant Hand: Right    Extremity/Trunk Assessment   Upper Extremity Assessment Upper Extremity Assessment: Defer to OT evaluation    Lower Extremity Assessment Lower Extremity Assessment: Generalized weakness (pt able to move against gravity, but not use for transfers, poor muscular endurance and power)    Cervical / Trunk Assessment Cervical / Trunk Assessment: Kyphotic  Communication   Communication: No difficulties  Cognition Arousal/Alertness: Awake/alert Behavior During Therapy: WFL for tasks assessed/performed Overall Cognitive Status: No family/caregiver present to determine baseline cognitive functioning                                 General Comments: decreased problem solving noted, mild ST Memory deficits        General Comments General comments (skin integrity, edema, etc.): VSS oN RA        Assessment/Plan    PT Assessment Patient needs continued PT services  PT Problem List Decreased strength       PT Treatment Interventions DME instruction;Gait training;Stair training;Functional mobility training;Therapeutic activities;Therapeutic exercise;Balance training;Patient/family education    PT Goals (Current goals can be found in the Care Plan section)  Acute Rehab PT Goals Patient Stated Goal: return home PT Goal Formulation: With patient Time For Goal Achievement: 12/11/21 Potential to Achieve Goals: Fair    Frequency Min 3X/week        Co-evaluation   Reason for Co-Treatment: Complexity of the patient's impairments (multi-system involvement);Necessary to address cognition/behavior during functional activity;For patient/therapist safety;To address functional/ADL transfers           AM-PAC PT "6 Clicks" Mobility  Outcome Measure Help needed turning from your back to your side while in a flat bed without using bedrails?: A Little Help needed moving from lying on your  back to sitting on the side of a flat bed without using bedrails?: A Little Help needed moving to and from a bed to a chair (including a wheelchair)?: A Little Help needed standing up from a chair using your arms (e.g., wheelchair or bedside chair)?: A Little Help needed to walk in hospital room?: A Little Help needed climbing 3-5 steps with a railing? : A Lot 6 Click Score: 17    End of Session Equipment Utilized During Treatment: Gait belt Activity Tolerance: Patient tolerated treatment well Patient left: in chair;with call bell/phone within reach;with chair alarm set Nurse Communication: Mobility status PT Visit Diagnosis: Unsteadiness on feet (R26.81);Other abnormalities of gait and mobility (R26.89);Repeated falls (R29.6);History of falling (Z91.81)    Time: 1610-9604 PT Time Calculation (min) (ACUTE ONLY): 11 min   Charges:   PT Evaluation $PT Eval Low Complexity: 1 Low          [place] Carbo, PT, DPT   Acute Rehabilitation Department Pager #: [phone]  Sandra Cockayne 11/27/2021, 1:51 PM

## 2021-11-27 NOTE — Progress Notes (Signed)
Subjective: I seen and evaluated [name] at bedside.  He states that he is not feeling so well.  He states that he feels fatigued and weak.  He endorsed that he was thirsty and his lips were dry.  He states that he is breathing fine.  Otherwise he denies any other complaints.  Objective:  Vital signs in last 24 hours: Vitals:   11/26/21 2049 11/26/21 2136 11/26/21 2350 11/27/21 0314  BP: 137/79 (!) 156/58 (!) 166/65 133/63  Pulse: (!) 106 (!) 107 92 98  Resp: 19 19 20 20   Temp: 98.5 F (36.9 C) 99.9 F (37.7 C) (!) 101.1 F (38.4 C) (!) 100.4 F (38 C)  TempSrc: (P) Temporal Oral Oral Oral  SpO2: (P) 100% 95% 94% 91%  Weight:  54.4 kg  54.1 kg  Height:  5\' 8"  (1.727 m)     Physical Exam Constitutional:      General: He is not in acute distress.    Appearance: He is ill-appearing.  HENT:     Head: Normocephalic and atraumatic.  Cardiovascular:     Rate and Rhythm: Normal rate and regular rhythm.     Heart sounds: Normal heart sounds.  Pulmonary:     Effort: Pulmonary effort is normal.     Comments: Course breath sounds noted on auscultation throughout bilateral lung fields. Abdominal:     General: Abdomen is flat.  Musculoskeletal:     Right lower leg: No edema.     Left lower leg: No edema.  Feet:     Comments: Bruising noted across the 4 first toes of the left foot.  Dried scab noted of the third toe on the right foot.  No sign of infection.  DP and PT pulses +2.  Bilateral feet warm to touch.  Heels-normal Skin:    General: Skin is warm and dry.  Neurological:     General: No focal deficit present.     Mental Status: He is alert. Mental status is at baseline.  Psychiatric:        Behavior: Behavior normal. Behavior is cooperative.     Assessment/Plan:  Principal Problem:   Hyperkalemia  #Hyperkalemia #ESRD on HD TTS Pt received HD yesterday. K+ (6.6-->3.8)  and Cr levels (8.85-->4.18) improved. Pt is awake and alert, as opposed to yesterday. Pt was  found down yesterday morning, unknown duration; CK levels were reassuring. Lokelma discontinued, potassium levels have improved. Pt is scheduled to receive dialysis today to stay on HD schedule, TTS. --Cardiac monitoring --Continue Fosrenol 5000 mg twice daily --Trend BMP  #Influenza A Overnight patient was febrile T-max 101 Fahrenheit; respiratory rate and pulse rate were elevated up to 24 and up to 105 respectively; new oxygen requirement, patient was placed on nasal cannula 2 L and is satting > 93%. This morning, during exam, pt was satting >95% on RA. NO increased work of breathing noting. However, coarse lungs sounds were appreciated on ascultation.  --Tamiflu started today --Tylenol 650 Q6H as needed for mild pain and fever --Supportive care -- Supplemental 02 PRN  #Bilateral lung transplant on tacrolimus Coarse lung sounds appreciated on ascultation. Pt denies SOB and sating well on RA during exam, >95%. There is concern for adrenal insufficiency due to current illness; glucose levels has not improved and pt now requiring 02 supplementation; not mounting appropriate immune response. Prednisone home dose increased 5mg -->15mg  daily. --Tacrolimus daily --Bactrim 800-160 mg daily --Azithromycin 250 mg every MWF --Prednisone 15mg  daily  #Type 2 diabetes with peripheral  neuropathy CBGs overnight stable in the low 100s without insulin and on D10 infusion. Pt able to swallow w/o difficulty. Diet started today. It is concerning that his glucose levels have not improved despite intravenous glucose infusion.  --Close monitoring CBGs Q2H --Diet started -- If patient is eating okay and glucose levels are >120; can discontinue D10 infusion  #Hypertension #HLD Pt BP is stable; normotensive. --Continue pravastatin 10 mg daily. --Continue aspirin 81 mg daily    Prior to Admission Living Arrangement: Anticipated Discharge Location: Barriers to Discharge: Dispo: Anticipated discharge in  approximately 1-2 day(s).   Timothy Lasso, MD 11/27/2021, 7:08 AM Pager: [phone] After 5pm on weekdays and 1pm on weekends: On Call pager [phone]

## 2021-11-28 LAB — BASIC METABOLIC PANEL
Anion gap: 9 (ref 5–15)
BUN: 9 mg/dL (ref 8–23)
CO2: 30 mmol/L (ref 22–32)
Calcium: 8.7 mg/dL — ABNORMAL LOW (ref 8.9–10.3)
Chloride: 96 mmol/L — ABNORMAL LOW (ref 98–111)
Creatinine, Ser: 3.31 mg/dL — ABNORMAL HIGH (ref 0.61–1.24)
GFR, Estimated: 18 mL/min — ABNORMAL LOW (ref >60)
Glucose, Bld: 111 mg/dL — ABNORMAL HIGH (ref 70–99)
Potassium: 3.7 mmol/L (ref 3.5–5.1)
Sodium: 135 mmol/L (ref 135–145)

## 2021-11-28 LAB — CBC
HCT: 42.5 % (ref 39.0–52.0)
Hemoglobin: 12.9 g/dL — ABNORMAL LOW (ref 13.0–17.0)
MCH: 28 pg (ref 26.0–34.0)
MCHC: 30.4 g/dL (ref 30.0–36.0)
MCV: 92.4 fL (ref 80.0–100.0)
Platelets: 157 10*3/uL (ref 150–400)
RBC: 4.6 MIL/uL (ref 4.22–5.81)
RDW: 16.5 % — ABNORMAL HIGH (ref 11.5–15.5)
WBC: 6.8 10*3/uL (ref 4.0–10.5)
nRBC: 0 % (ref 0.0–0.2)

## 2021-11-28 LAB — GLUCOSE, CAPILLARY
Glucose-Capillary: 102 mg/dL — ABNORMAL HIGH (ref 70–99)
Glucose-Capillary: 96 mg/dL (ref 70–99)
Glucose-Capillary: 153 mg/dL — ABNORMAL HIGH (ref 70–99)
Glucose-Capillary: 187 mg/dL — ABNORMAL HIGH (ref 70–99)
Glucose-Capillary: 274 mg/dL — ABNORMAL HIGH (ref 70–99)
Glucose-Capillary: 232 mg/dL — ABNORMAL HIGH (ref 70–99)

## 2021-11-28 MED ORDER — AMLODIPINE BESYLATE 5 MG PO TABS
5.0000 mg | ORAL_TABLET | Freq: Every day | ORAL | Status: DC
  Administered 2021-11-28 – 2021-11-29 (×2): 5 mg via ORAL
  Filled 2021-11-28 (×2): qty 1

## 2021-11-28 MED ORDER — LIP MEDEX EX OINT
TOPICAL_OINTMENT | CUTANEOUS | Status: DC | PRN
  Filled 2021-11-28: qty 7

## 2021-11-28 MED ORDER — DOXAZOSIN MESYLATE 4 MG PO TABS
4.0000 mg | ORAL_TABLET | Freq: Every day | ORAL | Status: DC
  Filled 2021-11-28: qty 1

## 2021-11-28 MED ORDER — CARVEDILOL 25 MG PO TABS
25.0000 mg | ORAL_TABLET | Freq: Two times a day (BID) | ORAL | Status: DC
  Administered 2021-11-28 – 2021-11-29 (×2): 25 mg via ORAL
  Filled 2021-11-28 (×2): qty 1

## 2021-11-28 NOTE — Progress Notes (Addendum)
Subjective: I seen and evaluated [name] at bedside.  He was currently in HD.  He was alert and oriented.  He states he is feeling much better than yesterday.  He denies chest pain, shortness of breath, myalgias.  He states his breathing is fine.  He endorses appetite.  Objective:  Vital signs in last 24 hours: Vitals:   11/28/21 0728 11/28/21 0758 11/28/21 0828 11/28/21 0858  BP: (!) 175/67 (!) 171/70 (!) 168/72 (!) 157/73  Pulse: 85 84 83 63  Resp: 19 18 19 15   Temp: 98.2 F (36.8 C)     TempSrc: Oral     SpO2: 97% 99% 97% 98%  Weight:      Height:       Physical Exam Constitutional:      General: He is awake. He is not in acute distress.    Comments: Coughed a few times   HENT:     Head: Normocephalic and atraumatic.  Eyes:     General: Lids are normal.  Cardiovascular:     Rate and Rhythm: Normal rate and regular rhythm.     Heart sounds: Normal heart sounds.  Pulmonary:     Effort: Pulmonary effort is normal.     Breath sounds: Normal air entry. Examination of the right-lower field reveals rales. Examination of the left-lower field reveals rales. Rales present.     Comments: Some expiratory wheezing noted Abdominal:     Palpations: Abdomen is soft.  Musculoskeletal:     Right lower leg: No edema.     Left lower leg: No edema.  Skin:    General: Skin is warm and dry.  Neurological:     General: No focal deficit present.     Mental Status: He is alert.  Psychiatric:        Behavior: Behavior normal. Behavior is cooperative.    Assessment/Plan:  Principal Problem:   Hyperkalemia  Hyperkalemia, resolved ESRD on HD TTS Patient did not receive hemodialysis Saturday; he actually received it today.  Nephrology following.  Cardiac monitoring DC'd, patient's potassium level is within normal limits.  Patient denies chest pain or shortness of breath. --Continue Fosrenol 5000 mg twice daily --Trend BMP  Influenza A Patient was afebrile overnight.  Patient  satting well on room air >95%.  He endorsed that he is feeling better and breathing fine.  He denies fatigue, myalgias.  He endorses congestion and cough.  Lung sounds has improved from yesterday, expiratory wheezes noted; mild rales at bilateral lung base. -- Continue renal dosed Tamiflu --Tylenol 650 every 6H as needed for mild pain/fever --Supportive care --Supplemental O2 as needed  Bilateral lung transplant on tacrolimus Patient continues to be satting well on room air greater than 95%.  Improvement in lung sounds.  Patient endorsed congestion and cough.  Prednisone dose was increased from 5 mg to 15 mg BID yesterday as there was concern for adrenal insufficiency in the setting of acute illness.  Patient appears to be tolerating well.   --Tacrolimus daily --Bactrim 800-160 mg to --Azithromycin to 250 mg every MWF --Prednisone 15 mg BID  Type 2 diabetes with peripheral neuropathy Hemoglobin A1c 5%. CBGs has improved since starting a diet.  Renal diet started yesterday. Patient received continuous D10 infusion which was DC'd yesterday afternoon.  Patient endorses appetite.  Patient was in HD when being seen and plans to eat afterwards.  Patient denies any symptoms at this time. Based on CBGs and minimal need of insulin. Pt ultimately will  not need insulin to manage his diabetes. Likely cause of frequent falls and hypoglycemia episodes.  --Close monitoring CBGs every 2 hours --Renal diet  Hypertension HLD Vitals significant for hypertension which was creeping up high as 191/77.  Patient home medication per nephrology:amlodipine 5 mg; carvedilol 25 mg twice daily and doxazosin 4mg ; However pt has no history of BPH noted and patient denies history of enlarged prostate. Pt has no record of doxazosin on his med list and wife confirms that he does not take that medication. Doxazosin has a risk for orthostasis and falls and patient is elderly; consider substitution if not absolutely  indicated. --Amlodipine 5 mg --Carvedilol 25 mg twice daily --Pravastatin 10 mg daily --Aspirin 81 mg daily   Prior to Admission Living Arrangement: Anticipated Discharge Location: Barriers to Discharge: Dispo: Anticipated discharge in approximately 1-2 day(s).   Timothy Lasso, MD 11/28/2021, 9:23 AM Pager: [phone] After 5pm on weekdays and 1pm on weekends: On Call pager [phone]

## 2021-11-28 NOTE — Progress Notes (Signed)
[facility] Progress Note    Assessment/ Plan:    Influenza A: Per primary Myoclonus in setting of severe hypoglycemia: BS 26 when seizure type activity occurred. Per primary. Hyperkalemia in setting of missed HD: s/p urgent HD 12/23-resolved Hypoglycemia: resolved. H/O Bilateral lung transplant: Follow at [facility]. On pred/tacrolimus. 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 S/p urgent HD 12/23 for hyperkalemia. Will maintain TTS schedule while he's here  Hypertension/volume  -BP higher on arrival to ED but received ativan, now appears controlled. Usually on amlodipine 5 mg PO q HS, Carvedilol 25 mg PO BID, Doxazosin 4 mg PO daily, . HR elevated, actually appears on dry side. Minimal UF with HD today.   Anemia  - HGB 11.9. Recent max ESA dose 11/23/21.   Metabolic bone disease -  C Ca+ 8.2 Continue VDRA, Fosrenol binders.  Hyponatremia-managing with hd  Nutrition - Albumin 3.0-push protein. Recommend renal diet DMT2-per primary  Outpatient Dialysis Orders: [place] T,Th,S 3.5 hrs 160NRe 350/600 54 kg 2.0K/2.0 Ca AVF -Heparin -None -Hectorol 2 mcg IV TIW -Mircera 225 mcg IV q 2 weeks (last dose 11/23/2021)    Subjective:   Seen and examined on HD. HD pushed to today due to staffing issues yesterday. No complaints today, tolerating treatment. He does endorse hunger.   Objective:   BP (!) 170/68 (BP Location: Left Arm)    Pulse 82    Temp 98.2 F (36.8 C) (Oral)    Resp (!) 21    Ht 5\' 8"  (1.727 m)    Wt 58.6 kg    SpO2 99%    BMI 19.64 kg/m   Intake/Output Summary (Last 24 hours) at 11/28/2021 1028 Last data filed at 11/28/2021 0341 Gross per 24 hour  Intake 608.33 ml  Output 0 ml  Net 608.33 ml   Weight change: 1.9 kg  Physical Exam: Gen:nad CVS:s1s2, rrr Resp:cta bl SJG:GEZM Ext:no edema Neuro: awake, alert, moves all ext spontaneously, speech clear and coherent Dialysis access: LUE AVF +b/t  Imaging: DG Chest 1  View  Result Date: 11/26/2021 CLINICAL DATA:  Fall due to hypoglycemia. EXAM: CHEST  1 VIEW COMPARISON:  Chest radiograph dated October 14, 2021. FINDINGS: The heart is enlarged. Low sternotomy changes and mediastinal surgical clips. Low lung volumes without evidence of focal consolidation or pleural effusion. IMPRESSION: 1.  Cardiomegaly with evidence of prior CABG. 2. Low lung volumes without evidence of focal consolidation or pleural effusion. Electronically Signed   By: Keane Police D.O.   On: 11/26/2021 13:15   DG Elbow Complete Left  Result Date: 11/26/2021 CLINICAL DATA:  Fall, elbow pain, initial encounter. EXAM: LEFT ELBOW - COMPLETE 3+ VIEW COMPARISON:  None. FINDINGS: No acute osseous abnormality. IMPRESSION: No acute osseous abnormality. Electronically Signed   By: Lorin Picket M.D.   On: 11/26/2021 13:11   CT Head Wo Contrast  Result Date: 11/26/2021 CLINICAL DATA:  Mental status change EXAM: CT HEAD WITHOUT CONTRAST TECHNIQUE: Contiguous axial images were obtained from the base of the skull through the vertex without intravenous contrast. COMPARISON:  10/14/2021 FINDINGS: Brain: No evidence of acute infarction, hemorrhage, extra-axial collection, ventriculomegaly, or mass effect. Chronic right basal ganglia lacunar infarct. Generalized cerebral atrophy. Periventricular white matter low attenuation likely secondary to microangiopathy. Vascular: Cerebrovascular atherosclerotic calcifications are noted. Skull: Negative for fracture or focal lesion. Sinuses/Orbits: Visualized portions of the orbits are unremarkable. Visualized portions of the paranasal sinuses are unremarkable. Visualized portions of the mastoid air cells  are unremarkable. Other: None. IMPRESSION: 1.  No acute intracranial pathology. 2. Chronic microvascular disease and cerebral atrophy. Electronically Signed   By: Kathreen Devoid M.D.   On: 11/26/2021 12:32    Labs: BMET Recent Labs  Lab 11/26/21 1251 11/26/21 1303  11/26/21 1642 11/26/21 2148 11/27/21 0217  NA 138 135 134* 134* 131*  K 7.2* 7.2* 6.6* 4.3 3.8  CL 96* 101 98 96* 95*  CO2 24  --  22 27 25   GLUCOSE 62* 62* 72 83 92  BUN 52* 48* 53* 14 13  CREATININE 8.77* 8.60* 8.85* 3.91* 4.18*  CALCIUM 7.4*  --  7.3* 8.0* 8.0*  PHOS  --   --   --   --  3.7   CBC Recent Labs  Lab 11/26/21 1251 11/26/21 1303 11/27/21 0217  WBC 10.8*  --  7.1  NEUTROABS 8.4*  --   --   HGB 11.9* 13.6 11.9*  HCT 39.0 40.0 39.5  MCV 95.6  --  93.8  PLT 164  --  154    Medications:     amLODipine  5 mg Oral Daily   aspirin EC  81 mg Oral Daily   azithromycin  250 mg Oral Q M,W,F   carvedilol  25 mg Oral BID WC   Chlorhexidine Gluconate Cloth  6 each Topical Q0600   doxazosin  4 mg Oral Daily   doxercalciferol  2 mcg Intravenous Q T,Th,Sa-HD   famotidine  20 mg Oral Daily   heparin  5,000 Units Subcutaneous Q8H   insulin aspart  0-6 Units Subcutaneous Q4H   lanthanum  1,500 mg Oral BID WC   oseltamivir  30 mg Oral QODAY   pravastatin  20 mg Oral Daily   predniSONE  15 mg Oral Q breakfast   sodium chloride flush  3 mL Intravenous Q12H   sulfamethoxazole-trimethoprim  1 tablet Oral Daily   tacrolimus  0.5 mg Oral QHS   tacrolimus  1 mg Oral Daily      Gean Quint, MD  [facility] 11/28/2021, 10:28 AM

## 2021-11-29 DIAGNOSIS — D849 Immunodeficiency, unspecified: Secondary | ICD-10-CM | POA: Insufficient documentation

## 2021-11-29 LAB — BASIC METABOLIC PANEL
Anion gap: 13 (ref 5–15)
BUN: 15 mg/dL (ref 8–23)
CO2: 25 mmol/L (ref 22–32)
Calcium: 8.3 mg/dL — ABNORMAL LOW (ref 8.9–10.3)
Chloride: 97 mmol/L — ABNORMAL LOW (ref 98–111)
Creatinine, Ser: 4.25 mg/dL — ABNORMAL HIGH (ref 0.61–1.24)
GFR, Estimated: 14 mL/min — ABNORMAL LOW (ref >60)
Glucose, Bld: 140 mg/dL — ABNORMAL HIGH (ref 70–99)
Potassium: 4 mmol/L (ref 3.5–5.1)
Sodium: 135 mmol/L (ref 135–145)

## 2021-11-29 LAB — CBC WITH DIFFERENTIAL/PLATELET
Abs Immature Granulocytes: 0.01 10*3/uL (ref 0.00–0.07)
Basophils Absolute: 0 10*3/uL (ref 0.0–0.1)
Basophils Relative: 0 %
Eosinophils Absolute: 0 10*3/uL (ref 0.0–0.5)
Eosinophils Relative: 0 %
HCT: 37.4 % — ABNORMAL LOW (ref 39.0–52.0)
Hemoglobin: 11.2 g/dL — ABNORMAL LOW (ref 13.0–17.0)
Immature Granulocytes: 0 %
Lymphocytes Relative: 18 %
Lymphs Abs: 0.9 10*3/uL (ref 0.7–4.0)
MCH: 27.9 pg (ref 26.0–34.0)
MCHC: 29.9 g/dL — ABNORMAL LOW (ref 30.0–36.0)
MCV: 93.3 fL (ref 80.0–100.0)
Monocytes Absolute: 0.5 10*3/uL (ref 0.1–1.0)
Monocytes Relative: 10 %
Neutro Abs: 3.7 10*3/uL (ref 1.7–7.7)
Neutrophils Relative %: 72 %
Platelets: 134 10*3/uL — ABNORMAL LOW (ref 150–400)
RBC: 4.01 MIL/uL — ABNORMAL LOW (ref 4.22–5.81)
RDW: 16.3 % — ABNORMAL HIGH (ref 11.5–15.5)
WBC: 5.1 10*3/uL (ref 4.0–10.5)
nRBC: 0.6 % — ABNORMAL HIGH (ref 0.0–0.2)

## 2021-11-29 LAB — GLUCOSE, CAPILLARY
Glucose-Capillary: 133 mg/dL — ABNORMAL HIGH (ref 70–99)
Glucose-Capillary: 151 mg/dL — ABNORMAL HIGH (ref 70–99)

## 2021-11-29 LAB — HEPATITIS B SURFACE ANTIBODY, QUANTITATIVE: Hep B S AB Quant (Post): 7.3 m[IU]/mL — ABNORMAL LOW (ref >9.9)

## 2021-11-29 MED ORDER — PREDNISONE 5 MG PO TABS
15.0000 mg | ORAL_TABLET | Freq: Two times a day (BID) | ORAL | Status: DC
  Filled 2021-11-29: qty 1

## 2021-11-29 MED ORDER — OSELTAMIVIR PHOSPHATE 30 MG PO CAPS: 30.0000 mg | ORAL_CAPSULE | ORAL | 0 refills | Status: AC

## 2021-11-29 MED ORDER — PREDNISONE 5 MG PO TABS: 15.0000 mg | ORAL_TABLET | Freq: Every day | ORAL | Status: DC

## 2021-11-29 MED ORDER — AZITHROMYCIN 250 MG PO TABS: ORAL_TABLET | ORAL | 0 refills | Status: DC

## 2021-11-29 NOTE — Care Management Important Message (Deleted)
Important Message  Patient Details  Name: Tyler Mueller MRN: [id number] Date of Birth: [birth year]   Medicare Important Message Given:  Yes     Shelda Altes 11/29/2021, 10:26 AM

## 2021-11-29 NOTE — Progress Notes (Signed)
Pt discharged per MD order. IV and tele removed. Discharge instructions reviewed with pt. Pt verbalized understanding. Pt escorted to personal vehicle to be transported home by son.  Glenden Rossell M

## 2021-11-29 NOTE — TOC Initial Note (Signed)
Transition of Care [facility]) - Initial/Assessment Note    Patient Details  Name: Tyler Mueller MRN: [id number] Date of Birth: 02-26-1941  [facility] CM/SW Contact:    Bartholomew Crews, RN Phone Number: [phone] 11/29/2021, 10:43 AM  Clinical Narrative:                  Spoke with patient at the bedside to discuss post acute transition needs. He is home with his wife. Demographics and PCP verified. [facility] on T[facility], and stated that he missed HD on Thursday but could not recall reason but stated that it couldn't be good for him to miss dialysis. Patient reported that he drives himself to dialysis. He stated that he uses a cane for ambulation. Patient stated that his son will provide transportation home at discharge.   Discussed HH PT recommendations. Patient stated that he is active with [facility] RN and PT. Spoke with Malachy Mood at [facility] to confirm active status. Patient will need HH RN, PT orders in order to resume services.   No further TOC needs identified at this time.   Expected Discharge Plan: [place] Barriers to Discharge: No Barriers Identified   Patient Goals and CMS Choice Patient states their goals for this hospitalization and ongoing recovery are:: return home with his wife CMS Medicare.gov Compare Post Acute Care list provided to:: Patient Choice offered to / list presented to : Patient  Expected Discharge Plan and Services Expected Discharge Plan: [place] In-house Referral: NA Discharge Planning Services: CM Consult Post Acute Care Choice: [place] arrangements for the past 2 months: Single Family Home                 DME Arranged: N/A DME Agency: NA       HH Arranged: PT, RN [place] Agency: [place] Date [place]: 11/29/21 Time HH Agency Contacted: 37 Representative spoke with at [place]: Malachy Mood  Prior Living  Arrangements/Services Living arrangements for the past 2 months: [place] with:: Self, Spouse Patient language and need for interpreter reviewed:: Yes Do you feel safe going back to the place where you live?: Yes      Need for Family Participation in Patient Care: No (Comment)   Current home services: Home PT, Home RN, DME Criminal Activity/Legal Involvement Pertinent to Current Situation/Hospitalization: No - Comment as needed  Activities of Daily Living      Permission Sought/Granted                  Emotional Assessment Appearance:: Appears stated age Attitude/Demeanor/Rapport: Engaged Affect (typically observed): Accepting Orientation: : Oriented to Self, Oriented to Place, Oriented to  Time, Oriented to Situation Alcohol / Substance Use: Not Applicable Psych Involvement: No (comment)  Admission diagnosis:  Hyperkalemia [E87.5] Hypoglycemia [E16.2] Influenza A [J10.1] Acute respiratory failure with hypoxia (HCC) [J96.01] Altered mental status, unspecified altered mental status type [R41.82] Patient Active Problem List   Diagnosis Date Noted   Hyperkalemia 11/26/2021   Clostridium difficile infection 04/27/2014   Herpes zoster 04/23/2014   Muscle spasm of back 04/21/2014   History of lung transplant ([place]) 04/21/2014   Diverticulitis 04/20/2014   CKD (chronic kidney disease) stage 5, GFR less than 15 ml/min ([place]) 04/20/2014   DIABETES MELLITUS 01/04/2008   DYSLIPIDEMIA 01/04/2008   IMMUNOCOMPROMISED 01/04/2008   BRONCHIECTASIS 01/04/2008   DYSPNEA ON EXERTION 01/04/2008  Complications of transplanted lung 12/10/2007   PCP:  Mikey College, MD Pharmacy:   [facility], [address] [place] [address] Phone: [phone] Fax: [phone]  [facility] [place], [address] [place] [address] Phone: [phone] Fax: [phone]     Social Determinants of Health (SDOH) Interventions    Readmission Risk Interventions No flowsheet data found.

## 2021-11-29 NOTE — Progress Notes (Signed)
Physical Therapy Treatment Patient Details Name: Tyler Mueller MRN: [id number] DOB: 03-26-1941 Today's Date: 11/29/2021   History of Present Illness This 80 y.o. male admitted with weakness and recurrent falls. He was found to have influenza A, and with hyperkalemia (pt had missed HD session).  PMH includes: h/o bil lung transplant, ESRD on HD, DM, peripheral neuropathy, HTN    PT Comments    The pt was seen for continued mobility and balance practice in anticipation of return home. He was able to complete multiple short bouts of gait in the room with added balance challenges and exercises. The pt continues to present with posterior lean on initial stand but did improve within the session when given max cues and opportunity to practice with support. He is most challenged with walking backwards (posterior LOB) and reactive stepping when outside BOS. Continues to need minG-minA to safely ambulate short distances and is at increased risk of falls. Recommend max HHPT to improve functional strength and balance to reduce risk of recurrent falls.     Recommendations for follow up therapy are one component of a multi-disciplinary discharge planning process, led by the attending physician.  Recommendations may be updated based on patient status, additional functional criteria and insurance authorization.  Follow Up Recommendations  Home health PT     Assistance Recommended at Discharge Frequent or constant Supervision/Assistance  Equipment Recommendations  None recommended by PT    Recommendations for Other Services       Precautions / Restrictions Precautions Precautions: Fall Precaution Comments: frequent falls (15 in last 6 months) Restrictions Weight Bearing Restrictions: No     Mobility  Bed Mobility Overal bed mobility: Modified Independent                  Transfers Overall transfer level: Needs assistance Equipment used: Rolling walker (2 wheels) Transfers: Sit  to/from Stand;Bed to chair/wheelchair/BSC Sit to Stand: Min guard     Step pivot transfers: Min guard     General transfer comment: pt dependent on UE, powers up well but has posterior lean upon initial stand. minA to correct as well as verbal cues    Ambulation/Gait Ambulation/Gait assistance: Min guard;Min assist Gait Distance (Feet): 60 Feet Assistive device: Rolling walker (2 wheels);1 person hand held assist Gait Pattern/deviations: Step-through pattern;Decreased stride length;Shuffle;Leaning posteriorly Gait velocity: decreased Gait velocity interpretation: <1.31 ft/sec, indicative of household ambulator   General Gait Details: multiple passes in the room with attempts with RW and HHA of 1 for increased balance challenge.       Balance Overall balance assessment: Needs assistance Sitting-balance support: Feet supported Sitting balance-Leahy Scale: Good     Standing balance support: Reliant on assistive device for balance Standing balance-Leahy Scale: Poor Standing balance comment: reliant on at least single UE support     Tandem Stance - Right Leg: 10 Tandem Stance - Left Leg: 8 [place] - Eyes Opened: 15   High level balance activites: Backward walking;Other (comment) High Level Balance Comments: modA to complete backwards walking due to significant posterior lean, improved with reps and cues/education.            Cognition Arousal/Alertness: Awake/alert Behavior During Therapy: WFL for tasks assessed/performed Overall Cognitive Status: No family/caregiver present to determine baseline cognitive functioning                                 General Comments: decreased problem solving noted, mild ST  Memory deficits        Exercises Other Exercises Other Exercises: pt able to reach to floor multiple times to pick up various objects without LOB Other Exercises: standing reaching outside BOS with good stability even with progressive distances.  x10 in various directions Other Exercises: reactive stepping practice x10. most assist to recover when falling posteriorly    General Comments General comments (skin integrity, edema, etc.): VSS on RA         PT Goals (current goals can now be found in the care plan section) Acute Rehab PT Goals Patient Stated Goal: return home PT Goal Formulation: With patient Time For Goal Achievement: 12/11/21 Potential to Achieve Goals: Fair Progress towards PT goals: Progressing toward goals    Frequency    Min 3X/week      PT Plan Current plan remains appropriate       AM-PAC PT "6 Clicks" Mobility   Outcome Measure  Help needed turning from your back to your side while in a flat bed without using bedrails?: A Little Help needed moving from lying on your back to sitting on the side of a flat bed without using bedrails?: A Little Help needed moving to and from a bed to a chair (including a wheelchair)?: A Little Help needed standing up from a chair using your arms (e.g., wheelchair or bedside chair)?: A Little Help needed to walk in hospital room?: A Little Help needed climbing 3-5 steps with a railing? : A Lot 6 Click Score: 17    End of Session Equipment Utilized During Treatment: Gait belt Activity Tolerance: Patient tolerated treatment well Patient left: with call bell/phone within reach;in bed Nurse Communication: Mobility status PT Visit Diagnosis: Unsteadiness on feet (R26.81);Other abnormalities of gait and mobility (R26.89);Repeated falls (R29.6);History of falling (Z91.81)     Time: 1700-1749 PT Time Calculation (min) (ACUTE ONLY): 21 min  Charges:  $Therapeutic Activity: 8-22 mins                     [place] Carbo, PT, DPT   Acute Rehabilitation Department Pager #: [phone]   Sandra Cockayne 11/29/2021, 11:33 AM

## 2021-11-29 NOTE — Discharge Summary (Signed)
Name: Tyler Mueller MRN: [id number] DOB: Apr 17, 1941 80 y.o. PCP: Mikey College, MD  Date of Admission: 11/26/2021 11:29 AM Date of Discharge: 11/29/21 Attending Physician: Angelica Pou, MD  Discharge Diagnosis: 1. 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 Influenza A 3. 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 Type 2 DM with peripheral Neuropathy 5. HTN/HLD   Discharge Medications: Allergies as of 11/29/2021       Reactions   Morphine And Related Nausea And Vomiting, Other (See Comments)   Hallucinations    Penicillins Nausea And Vomiting, Other (See Comments)   Hallucinations         Medication List     STOP taking these medications    doxazosin 4 MG tablet Commonly known as: CARDURA   NovoLOG FlexPen 100 UNIT/ML FlexPen Generic drug: insulin aspart       TAKE these medications    acetaminophen 500 MG tablet Commonly known as: TYLENOL Take 500 mg by mouth every 6 (six) hours as needed.   acyclovir 200 MG capsule Commonly known as: ZOVIRAX Take 4 capsules (800 mg total) by mouth daily.   amLODipine 5 MG tablet Commonly known as: NORVASC Take 5 mg by mouth daily.   aspirin EC 81 MG tablet Take 81 mg by mouth daily.   azithromycin 250 MG tablet Commonly known as: ZITHROMAX MAY ADMINISTER WITHOUT REGARD TO MEALS Start taking on: December 01, 2021   calcium acetate 667 MG capsule Commonly known as: PHOSLO Take 667 mg by mouth 2 (two) times daily with a meal.   carvedilol 25 MG tablet Commonly known as: COREG Take 25 mg by mouth 2 (two) times daily with a meal.   ferrous gluconate 225 (27 Fe) MG tablet Commonly known as: FERGON Take 240 mg by mouth 3 (three) times daily with meals.   multivitamin with minerals Tabs tablet Take 1 tablet by mouth daily.   multivitamin-lutein Caps capsule Take 1 capsule by mouth daily.   ondansetron 4 MG tablet Commonly known as: ZOFRAN Take 1 tablet (4 mg total) by mouth every 6 (six) hours as  needed for nausea or vomiting.   oseltamivir 30 MG capsule Commonly known as: TAMIFLU Take 1 capsule (30 mg total) by mouth every other day for 2 days. Start taking on: December 01, 2021   potassium chloride SA 20 MEQ tablet Commonly known as: KLOR-CON M Take 1 tablet (20 mEq total) by mouth daily.   pravastatin 40 MG tablet Commonly known as: PRAVACHOL Take 20 mg by mouth daily.   predniSONE 5 MG tablet Commonly known as: DELTASONE Take 5 mg by mouth 2 (two) times daily with a meal.   ranitidine 150 MG tablet Commonly known as: ZANTAC Take 150 mg by mouth 2 (two) times daily.   sodium bicarbonate 650 MG tablet Take 1,300 mg by mouth 3 (three) times daily.   sulfamethoxazole-trimethoprim 400-80 MG tablet Commonly known as: BACTRIM Take 1 tablet by mouth See admin instructions. Take 1 tablet by mouth nightly every Tuesday, Thursday, and Saturday take after dialysis. Take for Pneumocystis jiroveci pneumonia prevention.   tacrolimus 0.5 MG capsule Commonly known as: PROGRAF Take 0.5-1 mg by mouth See admin instructions. Take two 0.5 mg capsules(1.0mg ) by mouth in the AM and one 0.5 mg (0.5mg ) capsules in the PM   vancomycin 50 mg/mL Soln oral solution Commonly known as: VANCOCIN Take 2.5 mLs (125 mg total) by mouth every 6 (six) hours.        Disposition and follow-up:  [name] was discharged from [facility] in Stable condition.  At the hospital follow up visit please address:  1.  Influenza A: Continue tamiflu Wednesday, and Friday- that will complete the course. Do not take on HD days.      T2DM: Hold insulin for now to prevent further hypoglycemic episodes. Follow up with PCP  2.  Labs / imaging needed at time of follow-up: None  3.  Pending labs/ test needing follow-up: None  Follow-up Appointments:  Follow-up Information     [facility].   Why: the office will call to schedule home health visits Contact  information: [place] [address] [place] Hospital Course by problem list: 1. 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 The next morning, patient was found down next to his bed by his wife and EMS was called. On arrival to the ED,  he was found to be hyperkalemic at 7.2.  EKG significant for peaked T waves.  Nephrology was consulted and recommended immediate hemodialysis. Pt received HD twice during hospitalization. Potassium levels improved significantly following HD and trended to normal  range. No arrhythmias noted on cardiac monitoring. Pt was continued on Fosrenol daily. 2. Influenza A: Prior to admission, pt's wife states for the last 2 days patient reports lack of appetite, nausea and "not feeling well" generally.  Denies any recent sick contacts.  Denies fevers or chills. Patient is vaccinated with flu shot and COVID. Resp panel obtained in ED, positive for influenza A. Chest x-ray showed no evidence of consolidation or pleural effusion. Pt was started on renal dosed tamiflu. Pt improved daily and lungs were clear on ascultation. 3. 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 Also, pt takes bactrim, azithromycin and prednisone daily. These medications were continued throughout hospital course. In the setting of acute illness and risk for adrenal insufficieny, pt prednisone dose was increased (5mg -->15mg ) to prevent further complications. 4. Type 2 DM with peripheral neuropathy: Pt home regimen is SSI. According to the wife she is unsure how much insulin he uses throughout the day. Pt has Hx of recurrent falls in the past month, partially 2/2 hypoglycemia. Pt was found down and wife called EMS, he was found to be hypoglycemic; he received some food and glucose levels improved. On  arrival to the ED, glucose level was within normal range. And due to him being hyperkalemic, he received IV insulin and D50; however, following insulin administration, pt's glucose dropped to 26 and pt began to seize (lasted 1 to 2 minutes); ativan was given and seizure ceased. He was also given another dose of D50. CBGs were monitored Q2H throughout hospitalization. Pt was placed on D10 infusion until able to tolerate diet. CBGs improved and no further hypoglycemic episodes noted. Once pt able to tolerate diet, D10 infusion was discontinued. CBGs ranged from 140s-200s; and this in the setting of increased prednisone use noted above. 5. HTN/HLD- Pt continued on home medication: pravastatin 10mg  and aspirin 81mg  daily.  Discharge Exam:   BP (!) 168/60 (BP Location: Right Arm)    Pulse 71    Temp 97.8 F (36.6 C) (Oral)    Resp 18    Ht 5\' 8"  (1.727 m)    Wt 58.6  kg    SpO2 99%    BMI 19.64 kg/m  Discharge exam:   Pertinent Labs, Studies, and Procedures:  DG Chest 1 View  Result Date: 11/26/2021 CLINICAL DATA:  Fall due to hypoglycemia. EXAM: CHEST  1 VIEW COMPARISON:  Chest radiograph dated October 14, 2021. FINDINGS: The heart is enlarged. Low sternotomy changes and mediastinal surgical clips. Low lung volumes without evidence of focal consolidation or pleural effusion. IMPRESSION: 1.  Cardiomegaly with evidence of prior CABG. 2. Low lung volumes without evidence of focal consolidation or pleural effusion. Electronically Signed   By: Keane Police D.O.   On: 11/26/2021 13:15   DG Elbow Complete Left  Result Date: 11/26/2021 CLINICAL DATA:  Fall, elbow pain, initial encounter. EXAM: LEFT ELBOW - COMPLETE 3+ VIEW COMPARISON:  None. FINDINGS: No acute osseous abnormality. IMPRESSION: No acute osseous abnormality. Electronically Signed   By: Lorin Picket M.D.   On: 11/26/2021 13:11   CT Head Wo Contrast  Result Date: 11/26/2021 CLINICAL DATA:  Mental status change EXAM: CT HEAD WITHOUT CONTRAST  TECHNIQUE: Contiguous axial images were obtained from the base of the skull through the vertex without intravenous contrast. COMPARISON:  10/14/2021 FINDINGS: Brain: No evidence of acute infarction, hemorrhage, extra-axial collection, ventriculomegaly, or mass effect. Chronic right basal ganglia lacunar infarct. Generalized cerebral atrophy. Periventricular white matter low attenuation likely secondary to microangiopathy. Vascular: Cerebrovascular atherosclerotic calcifications are noted. Skull: Negative for fracture or focal lesion. Sinuses/Orbits: Visualized portions of the orbits are unremarkable. Visualized portions of the paranasal sinuses are unremarkable. Visualized portions of the mastoid air cells are unremarkable. Other: None. IMPRESSION: 1.  No acute intracranial pathology. 2. Chronic microvascular disease and cerebral atrophy. Electronically Signed   By: Kathreen Devoid M.D.   On: 11/26/2021 12:32     Discharge Instructions: Discharge Instructions     Call MD for:  difficulty breathing, headache or visual disturbances   Complete by: As directed    Call MD for:  extreme fatigue   Complete by: As directed    Call MD for:  hives   Complete by: As directed    Call MD for:  persistant dizziness or light-headedness   Complete by: As directed    Call MD for:  persistant nausea and vomiting   Complete by: As directed    Call MD for:  redness, tenderness, or signs of infection (pain, swelling, redness, odor or green/yellow discharge around incision site)   Complete by: As directed    Call MD for:  severe uncontrolled pain   Complete by: As directed    Call MD for:  temperature >100.4   Complete by: As directed    Diet - low sodium heart healthy   Complete by: As directed    Discharge instructions   Complete by: As directed    Please take the Tamiflu medication on Wednesday and Friday of this week to complete the regimen. Please do not take on days that you are getting Hemodialysis.  Also  continue taking all your medications are prescribed.  Please do not take anymore insulin to prevent future hypoglycemic episodes.   Follow up with your doctor at [facility].   Thank you,  Glad you are feeling better!   Increase activity slowly   Complete by: As directed        Signed: Timothy Lasso, MD 11/29/2021, 10:44 AM   Pager: [phone]

## 2021-11-29 NOTE — Plan of Care (Signed)
Problem: Education: °Goal: Knowledge of General Education information will improve °Description: Including pain rating scale, medication(s)/side effects and non-pharmacologic comfort measures °Outcome: Completed/Met °  °

## 2021-11-29 NOTE — Progress Notes (Signed)
Occupational Therapy Treatment Patient Details Name: Tyler Mueller MRN: [id number] DOB: 08-19-1941 Today's Date: 11/29/2021   History of present illness This 80 y.o. male admitted with weakness and recurrent falls. He was found to have influenza A, and with hyperkalemia (pt had missed HD session).  PMH includes: h/o bil lung transplant, ESRD on HD, DM, peripheral neuropathy, HTN   OT comments  Patient sitting on EOB completing lunch upon arrival. Patient stated he was waiting to leave and had performed PT earlier but was willing to participate with OT for toilet transfers and grooming at sink. Fall prevention techniques discussed with patient with patient able to recite 3/3 techniques after discussion. Patient is expected to discharge home today.    Recommendations for follow up therapy are one component of a multi-disciplinary discharge planning process, led by the attending physician.  Recommendations may be updated based on patient status, additional functional criteria and insurance authorization.    Follow Up Recommendations  No OT follow up    Assistance Recommended at Discharge Set up Supervision/Assistance  Equipment Recommendations  None recommended by OT    Recommendations for Other Services      Precautions / Restrictions Precautions Precautions: Fall Precaution Comments: frequent falls (15 in last 6 months) Restrictions Weight Bearing Restrictions: No       Mobility Bed Mobility Overal bed mobility: Modified Independent             General bed mobility comments: sitting on EOB    Transfers Overall transfer level: Needs assistance Equipment used: Rolling walker (2 wheels) Transfers: Sit to/from Stand;Bed to chair/wheelchair/BSC Sit to Stand: Min guard   Step pivot transfers: Min guard       General transfer comment: min guard due to posterior leaning     Balance Overall balance assessment: Needs assistance Sitting-balance support: Feet  supported Sitting balance-Leahy Scale: Good Sitting balance - Comments: sitting on EOB when arrived   Standing balance support: Reliant on assistive device for balance Standing balance-Leahy Scale: Poor Standing balance comment: stood at sink for grooming with no support     Tandem Stance - Right Leg: 10 Tandem Stance - Left Leg: 8 Rhomberg - Eyes Opened: 15   High level balance activites: Backward walking;Other (comment) High Level Balance Comments: modA to complete backwards walking due to significant posterior lean, improved with reps and cues/education.           ADL either performed or assessed with clinical judgement   ADL Overall ADL's : Needs assistance/impaired     Grooming: Wash/dry hands;Wash/dry face;Supervision/safety Grooming Details (indicate cue type and reason): standing at sink                 Toilet Transfer: Ambulation;Rolling walker (2 wheels);Regular Toilet;Min guard             General ADL Comments: supervision for balance and safety for grooming    Extremity/Trunk Assessment              Vision       Perception     Praxis      Cognition Arousal/Alertness: Awake/alert Behavior During Therapy: WFL for tasks assessed/performed Overall Cognitive Status: No family/caregiver present to determine baseline cognitive functioning                                 General Comments: decreased problem solving noted, mild ST Memory deficits  Exercises Exercises: Other exercises Other Exercises Other Exercises: pt able to reach to floor multiple times to pick up various objects without LOB Other Exercises: standing reaching outside BOS with good stability even with progressive distances. x10 in various directions Other Exercises: reactive stepping practice x10. most assist to recover when falling posteriorly   Shoulder Instructions       General Comments discussed fall prevention techniques    Pertinent  Vitals/ Pain       Pain Assessment: No/denies pain  Home Living                                          Prior Functioning/Environment              Frequency  Min 2X/week        Progress Toward Goals  OT Goals(current goals can now be found in the care plan section)  Progress towards OT goals: Progressing toward goals  Acute Rehab OT Goals Patient Stated Goal: go home OT Goal Formulation: With patient Time For Goal Achievement: 12/11/21 Potential to Achieve Goals: Good ADL Goals Pt Will Perform Grooming: with modified independence;standing Pt Will Perform Lower Body Bathing: with modified independence;sit to/from stand Pt Will Perform Lower Body Dressing: with modified independence;sit to/from stand Pt Will Transfer to Toilet: with modified independence;ambulating Additional ADL Goal #1: Pt will indep recall at least 3 fall precention strategies to apply in the home setting  Plan Discharge plan remains appropriate    Co-evaluation                 AM-PAC OT "6 Clicks" Daily Activity     Outcome Measure   Help from another person eating meals?: None Help from another person taking care of personal grooming?: None Help from another person toileting, which includes using toliet, bedpan, or urinal?: A Little Help from another person bathing (including washing, rinsing, drying)?: A Little Help from another person to put on and taking off regular upper body clothing?: None Help from another person to put on and taking off regular lower body clothing?: A Little 6 Click Score: 21    End of Session Equipment Utilized During Treatment: Gait belt;Rolling walker (2 wheels)  OT Visit Diagnosis: Unsteadiness on feet (R26.81)   Activity Tolerance Patient tolerated treatment well   Patient Left Other (comment) (sitting on EOB)   Nurse Communication Mobility status        Time: 1149-1200 OT Time Calculation (min): 11 min  Charges: OT General  Charges $OT Visit: 1 Visit OT Treatments $Self Care/Home Management : 8-22 mins  .rlo  Trixie Dredge 11/29/2021, 1:13 PM

## 2021-11-30 ENCOUNTER — Telehealth: Payer: Self-pay | Admitting: Nurse Practitioner

## 2021-11-30 NOTE — Telephone Encounter (Signed)
Transition of care contact from inpatient facility  Date of discharge: 11/29/2021 Date of contact: 11/30/2021 Method: Phone Spoke to: Spouse  Patient contacted to discuss transition of care from recent inpatient hospitalization. Patient was admitted to [facility] from 12/24-12/26/2022 with discharge diagnosis of Influenza A  Medication changes were reviewed.  Patient will follow up with his/her outpatient HD unit on: 11/30/2021

## 2021-12-01 DIAGNOSIS — J09X2 Influenza due to identified novel influenza A virus with other respiratory manifestations: Secondary | ICD-10-CM | POA: Insufficient documentation

## 2022-02-04 ENCOUNTER — Encounter (HOSPITAL_COMMUNITY): Payer: Self-pay | Admitting: *Deleted

## 2022-02-04 ENCOUNTER — Emergency Department (HOSPITAL_COMMUNITY)
Admission: EM | Admit: 2022-02-04 | Discharge: 2022-02-04 | Disposition: A | Discharge status: Home / Self Care | Payer: Medicare Other | Attending: Emergency Medicine | Admitting: Emergency Medicine

## 2022-02-04 ENCOUNTER — Other Ambulatory Visit: Payer: Self-pay

## 2022-02-04 ENCOUNTER — Emergency Department (HOSPITAL_COMMUNITY): Payer: Medicare Other

## 2022-02-04 DIAGNOSIS — Z7982 Long term (current) use of aspirin: Secondary | ICD-10-CM | POA: Diagnosis not present

## 2022-02-04 DIAGNOSIS — M79674 Pain in right toe(s): Secondary | ICD-10-CM | POA: Diagnosis not present

## 2022-02-04 DIAGNOSIS — N186 End stage renal disease: Secondary | ICD-10-CM | POA: Diagnosis present

## 2022-02-04 DIAGNOSIS — E119 Type 2 diabetes mellitus without complications: Secondary | ICD-10-CM | POA: Diagnosis not present

## 2022-02-04 DIAGNOSIS — Z992 Dependence on renal dialysis: Secondary | ICD-10-CM | POA: Insufficient documentation

## 2022-02-04 HISTORY — DX: Type 2 diabetes mellitus without complications: E11.9

## 2022-02-04 LAB — COMPREHENSIVE METABOLIC PANEL
ALT: 12 U/L (ref 0–44)
AST: 21 U/L (ref 15–41)
Albumin: 3.4 g/dL — ABNORMAL LOW (ref 3.5–5.0)
Alkaline Phosphatase: 86 U/L (ref 38–126)
Anion gap: 9 (ref 5–15)
BUN: 30 mg/dL — ABNORMAL HIGH (ref 8–23)
CO2: 28 mmol/L (ref 22–32)
Calcium: 8.5 mg/dL — ABNORMAL LOW (ref 8.9–10.3)
Chloride: 99 mmol/L (ref 98–111)
Creatinine, Ser: 4.83 mg/dL — ABNORMAL HIGH (ref 0.61–1.24)
GFR, Estimated: 12 mL/min — ABNORMAL LOW (ref >60)
Glucose, Bld: 237 mg/dL — ABNORMAL HIGH (ref 70–99)
Potassium: 5.4 mmol/L — ABNORMAL HIGH (ref 3.5–5.1)
Sodium: 136 mmol/L (ref 135–145)
Total Bilirubin: 0.5 mg/dL (ref 0.3–1.2)
Total Protein: 6.6 g/dL (ref 6.5–8.1)

## 2022-02-04 LAB — CBC WITH DIFFERENTIAL/PLATELET
Abs Immature Granulocytes: 0.13 10*3/uL — ABNORMAL HIGH (ref 0.00–0.07)
Basophils Absolute: 0.1 10*3/uL (ref 0.0–0.1)
Basophils Relative: 1 %
Eosinophils Absolute: 0.1 10*3/uL (ref 0.0–0.5)
Eosinophils Relative: 1 %
HCT: 33.9 % — ABNORMAL LOW (ref 39.0–52.0)
Hemoglobin: 10.2 g/dL — ABNORMAL LOW (ref 13.0–17.0)
Immature Granulocytes: 2 %
Lymphocytes Relative: 14 %
Lymphs Abs: 1.1 10*3/uL (ref 0.7–4.0)
MCH: 28.7 pg (ref 26.0–34.0)
MCHC: 30.1 g/dL (ref 30.0–36.0)
MCV: 95.2 fL (ref 80.0–100.0)
Monocytes Absolute: 0.6 10*3/uL (ref 0.1–1.0)
Monocytes Relative: 7 %
Neutro Abs: 6.2 10*3/uL (ref 1.7–7.7)
Neutrophils Relative %: 75 %
Platelets: 150 10*3/uL (ref 150–400)
RBC: 3.56 MIL/uL — ABNORMAL LOW (ref 4.22–5.81)
RDW: 18.5 % — ABNORMAL HIGH (ref 11.5–15.5)
WBC: 8.2 10*3/uL (ref 4.0–10.5)
nRBC: 0 % (ref 0.0–0.2)

## 2022-02-04 NOTE — ED Triage Notes (Signed)
Pt reports cutting his toenail, is diabetic and now has black discoloration to right 3rd toe. Denies any pain. ?

## 2022-02-04 NOTE — ED Provider Triage Note (Signed)
Emergency Medicine Provider Triage Evaluation Note ? ?Tyler Mueller , a 81 y.o. male  was evaluated in triage.  Pt complains of discoloration of the right third toe.  Patient was recently cutting his toenail per the wife, and cut a small piece of skin off.  Patient has diabetic neuropathy, takes gabapentin for this.  Did not notice pain.  Denies fever.  No other complaints. ? ?Review of Systems  ?Positive: As above ?Negative: As above ? ?Physical Exam  ?BP (!) 145/61 (BP Location: Right Arm)   Pulse 78   Temp 97.9 ?F (36.6 ?C) (Oral)   Resp 18   SpO2 100%  ?Gen:   Awake, no distress   ?Resp:  Normal effort  ?MSK:   Moves extremities without difficulty  ?Other:  Distal third of toe appears black with an erythematous border.  Non-TTP.  DP and PT pulses 2+. ? ?Medical Decision Making  ?Medically screening exam initiated at 2:35 PM.  Appropriate orders placed.  Tyler Mueller was informed that the remainder of the evaluation will be completed by another provider, this initial triage assessment does not replace that evaluation, and the importance of remaining in the ED until their evaluation is complete. ? ?Labs ordered ?  ?Prince Rome, PA-C ?35/57/32 [id number] ? ?

## 2022-02-04 NOTE — ED Notes (Signed)
Per pt cut toenail and cut the skin on the back of the toe. Then he went to have a pedicure afterwards he found his middle toe on rt ft. Noted the tip of the toes is black and the nail appears to be coming off. Pt denies any pain at this time ?

## 2022-02-04 NOTE — ED Notes (Signed)
Pt not in room at this time

## 2022-02-04 NOTE — Discharge Instructions (Addendum)
You must follow-up with the Ortho specialist early next week.  I spoke with Dr. Zachery Dakins and he is going to try to arrange follow-up with his office or with Dr. Sharol Given.  If you do not receive a phone call on Monday morning, please contact their offices to try getting an appointment this coming week.  If in the meantime you notice worsening redness, area of black, come back to ER for reassessment. ?

## 2022-02-04 NOTE — ED Notes (Signed)
Returned from radiology at this time 

## 2022-02-04 NOTE — ED Notes (Signed)
Pt asking if provider is coming back in advised yes and that there is an ortho consult. Pt states he needs to go  home because he has dialysis tomorrow ?

## 2022-02-04 NOTE — ED Notes (Signed)
Provider at bedside

## 2022-02-05 ENCOUNTER — Telehealth (HOSPITAL_COMMUNITY): Payer: Self-pay | Admitting: Orthopedic Surgery

## 2022-02-05 NOTE — Telephone Encounter (Signed)
Spoke to patient's wife this morning. I had discussed patient's case with Dr. Sharol Given is willing to see the patient in clinic this upcoming week to evaluate his vascular status, refer to vascular surgery if needed, and discuss further treatment for the necrotic toe. Provided wife with Dr. Jess Barters office phone number and address. She will call to schedule appointment Monday. ?

## 2022-02-05 NOTE — ED Provider Notes (Signed)
[facility] EMERGENCY DEPARTMENT Provider Note   CSN: [id number] Arrival date & time: 02/04/22  1318     History  Chief Complaint  Patient presents with   Foot Ulcer    Tyler Mueller is a 81 y.o. male.  Presenting to the emergency department with concern for toe wound.  Reports that for the past few weeks he has noted skin discoloration to the tip of his third toe on his right foot.  Thinks this may have been associated with healing of small cut from a while ago.  States that he showed it to his wife on Monday, has not changed significantly since Monday.  Wife states that she has tried convincing him to get it evaluated all week and he finally went today.  Patient denies any pain in the foot, no generalized swelling of the foot or redness in the foot itself.  Patient has extensive medical history including history of lung transplant, ESRD on dialysis.  Patient reports that he has dialysis tomorrow.  HPI     Home Medications Prior to Admission medications   Medication Sig Start Date End Date Taking? Authorizing Provider  acetaminophen (TYLENOL) 500 MG tablet Take 500 mg by mouth every 6 (six) hours as needed.    [provider]  acyclovir (ZOVIRAX) 200 MG capsule Take 4 capsules (800 mg total) by mouth daily. 04/27/14   Jerrye Noble, MD  amLODipine (NORVASC) 5 MG tablet Take 5 mg by mouth daily.    [provider]  aspirin EC 81 MG tablet Take 81 mg by mouth daily.    [provider]  azithromycin (ZITHROMAX) 250 MG tablet MAY ADMINISTER WITHOUT REGARD TO MEALS 12/01/21   Timothy Lasso, MD  calcium acetate (PHOSLO) 667 MG capsule Take 667 mg by mouth 2 (two) times daily with a meal.    [provider]  carvedilol (COREG) 25 MG tablet Take 25 mg by mouth 2 (two) times daily with a meal.    [provider]  ferrous gluconate (FERGON) 225 (27 FE) MG tablet Take 240 mg by mouth 3 (three) times daily with meals.     [provider]  Multiple Vitamin (MULTIVITAMIN WITH MINERALS) TABS tablet Take 1 tablet by mouth daily.    [provider]  multivitamin-lutein (OCUVITE-LUTEIN) CAPS capsule Take 1 capsule by mouth daily.    [provider]  ondansetron (ZOFRAN) 4 MG tablet Take 1 tablet (4 mg total) by mouth every 6 (six) hours as needed for nausea or vomiting. Patient not taking: Reported on 37/16/9678 9/38/10   Delora Fuel, MD  potassium chloride SA (K-DUR,KLOR-CON) 20 MEQ tablet Take 1 tablet (20 mEq total) by mouth daily. 1/75/10   Delora Fuel, MD  pravastatin (PRAVACHOL) 40 MG tablet Take 20 mg by mouth daily.    [provider]  predniSONE (DELTASONE) 5 MG tablet Take 5 mg by mouth 2 (two) times daily with a meal.    [provider]  ranitidine (ZANTAC) 150 MG tablet Take 150 mg by mouth 2 (two) times daily.    [provider]  sodium bicarbonate 650 MG tablet Take 1,300 mg by mouth 3 (three) times daily.    [provider]  sulfamethoxazole-trimethoprim (BACTRIM,SEPTRA) 400-80 MG per tablet Take 1 tablet by mouth See admin instructions. Take 1 tablet by mouth nightly every Tuesday, Thursday, and Saturday take after dialysis. Take for Pneumocystis jiroveci pneumonia prevention. Patient not taking: Reported on 11/26/2021    [provider]  tacrolimus (PROGRAF) 0.5 MG capsule Take 0.5-1 mg by mouth See admin instructions. Take two 0.5 mg capsules(1.'0mg'$ ) by mouth in the AM and one 0.5 mg (0.'5mg'$ ) capsules in the PM    [provider]  vancomycin (VANCOCIN) 50 mg/mL oral solution Take 2.5 mLs (125 mg total) by mouth every 6 (six) hours. Patient not taking: Reported on 11/26/2021 04/28/14   Jerrye Noble, MD      Allergies    Morphine and related and Penicillins    Review of Systems   Review of Systems  Constitutional:  Negative for chills and fever.  HENT:  Negative for ear pain and sore throat.   Eyes:  Negative for pain  and visual disturbance.  Respiratory:  Negative for cough and shortness of breath.   Cardiovascular:  Negative for chest pain and palpitations.  Gastrointestinal:  Negative for abdominal pain and vomiting.  Genitourinary:  Negative for dysuria and hematuria.  Musculoskeletal:  Negative for arthralgias and back pain.  Skin:  Positive for wound. Negative for color change and rash.  Neurological:  Negative for seizures and syncope.  All other systems reviewed and are negative.  Physical Exam Updated Vital Signs BP (!) 182/78 (BP Location: Right Arm)    Pulse 91    Temp 98 F (36.7 C) (Oral)    Resp 18    Ht '5\' 8"'$  (1.727 m)    Wt 54.4 kg    SpO2 99%    BMI 18.25 kg/m  Physical Exam Vitals and nursing note reviewed.  Constitutional:      General: He is not in acute distress.    Appearance: He is well-developed.  HENT:     Head: Normocephalic and atraumatic.  Eyes:     Conjunctiva/sclera: Conjunctivae normal.  Cardiovascular:     Rate and Rhythm: Normal rate and regular rhythm.     Heart sounds: Normal heart sounds.  Pulmonary:     Effort: Pulmonary effort is normal. No respiratory distress.  Abdominal:     Palpations: Abdomen is soft.     Tenderness: There is no abdominal tenderness.  Musculoskeletal:     Cervical back: Neck supple.     Comments: Tip of right third toe appears black, necrotic, remainder of the toe is mildly erythematous; the remainder of the foot does not have erythema, generally feels well perfused, normal cap refill throughout rest of foot and toes  Skin:    General: Skin is warm and dry.     Capillary Refill: Capillary refill takes less than 2 seconds.  Neurological:     Mental Status: He is alert.  Psychiatric:        Mood and Affect: Mood normal.     Media Information Document Information  Photos  R foot   02/04/2022 20:20  Attached To:  Hospital Encounter on 02/04/22   Source Information  Flecia Shutter, Ellwood Dense, MD   [facility]     Media  Information Document Information  Photos  R foot   02/04/2022 20:19  Attached To:  Hospital Encounter on 02/04/22   Source Information  Ilena Dieckman, Ellwood Dense, MD   [facility]     ED Results / Procedures / Treatments   Labs (all labs ordered are listed, but only abnormal results are displayed) Labs Reviewed  COMPREHENSIVE METABOLIC PANEL - Abnormal; Notable for the following components:      Result Value   Potassium 5.4 (*)    Glucose, Bld 237 (*)    BUN  30 (*)    Creatinine, Ser 4.83 (*)    Calcium 8.5 (*)    Albumin 3.4 (*)    GFR, Estimated 12 (*)    All other components within normal limits  CBC WITH DIFFERENTIAL/PLATELET - Abnormal; Notable for the following components:   RBC 3.56 (*)    Hemoglobin 10.2 (*)    HCT 33.9 (*)    RDW 18.5 (*)    Abs Immature Granulocytes 0.13 (*)    All other components within normal limits  CBG MONITORING, ED    EKG None  Radiology DG Toe 3rd Right  Result Date: 02/04/2022 CLINICAL DATA:  Discoloration in the right third toe, initial encounter EXAM: RIGHT THIRD TOE COMPARISON:  None. FINDINGS: No acute fracture or dislocation is noted. Diffuse vascular calcifications are seen. Some loss of overlying soft tissue in the distal third toe is noted. No discrete bony erosive changes are noted however to suggest osteomyelitis. IMPRESSION: Decrease in overlying soft tissue in the distal right third toe although no definitive bony destruction is noted. Electronically Signed   By: Inez Catalina M.D.   On: 02/04/2022 21:02    Procedures Procedures    Medications Ordered in ED Medications - No data to display  ED Course/ Medical Decision Making/ A&P                           Medical Decision Making Amount and/or Complexity of Data Reviewed Radiology: ordered.   81 year old gentleman presented for toe wound.  On his right third toe, he has a wound on the very tip that appears black, necrotic.  Very complex patient given his history  of ESRD as well as lung transplant on chronic immunosuppressive's as well as chronic antibiotics (Bactrim), peripheral vascular disease which patient reports he is followed by [facility] provider.  Also has diabetes further predisposing patient to wounds like this.  X-ray without overt signs of osteomyelitis.  His basic lab work is stable, no leukocytosis.  Potassium 5.4 but he is planning dialysis tomorrow.  I discussed the case with orthopedics on-call, Dr. Clayborne Dana.  He states that given the area of concern is isolated to that 1 toe and only the tip of the toe that this can be safely addressed in the outpatient setting so long as patient is not systemically ill.  He states that either he will follow-up with patient or have patient follow-up with Dr. Sharol Given sometime early next week.  He does anticipate that patient may need toe amputation.  I discussed these recommendations with patient and wife at bedside.  They endorsed need for follow-up with a specialist and likely additional procedure that will be needed.  I discussed very strict return precautions that should the area of necrosis change at all or the area of erythema change or if he develops generalized pain/swelling/redness that he must return to ER for reassessment.    After the discussed management above, the patient was determined to be safe for discharge.  The patient was in agreement with this plan and all questions regarding their care were answered.  ED return precautions were discussed and the patient will return to the ED with any significant worsening of condition.         Final Clinical Impression(s) / ED Diagnoses Final diagnoses:  Pain of toe of right foot    Rx / DC Orders ED Discharge Orders     None  Lucrezia Starch, MD 02/05/22 Shelah Lewandowsky

## 2022-02-08 ENCOUNTER — Ambulatory Visit (INDEPENDENT_AMBULATORY_CARE_PROVIDER_SITE_OTHER): Payer: Medicare Other | Admitting: Orthopedic Surgery

## 2022-02-08 ENCOUNTER — Encounter: Payer: Self-pay | Admitting: Orthopedic Surgery

## 2022-02-08 ENCOUNTER — Other Ambulatory Visit: Payer: Self-pay | Admitting: *Deleted

## 2022-02-08 DIAGNOSIS — M25561 Pain in right knee: Secondary | ICD-10-CM

## 2022-02-08 DIAGNOSIS — I96 Gangrene, not elsewhere classified: Secondary | ICD-10-CM

## 2022-02-08 DIAGNOSIS — M25562 Pain in left knee: Secondary | ICD-10-CM

## 2022-02-08 NOTE — Progress Notes (Signed)
? ?Office Visit Note ?  ?Patient: Tyler Mueller           ?Date of Birth: Oct 12, 1941           ?MRN: [id number] ?Visit Date: 02/08/2022 ?             ?Requested by: Mikey College, MD ?[facility] [address] ?[place],  [place] [address] ?PCP: Mikey College, MD ? ?Chief Complaint  ?Patient presents with  ? Right Foot - Wound Check  ? ? ? ? ?HPI: ?Patient is a 81 year old gentleman who is seen for initial evaluation for gangrene third toe right foot in referral from Dr. Noemi Chapel.  Patient was recently in the emergency room on March 3. ? ?Patient has a history of bilateral lung transplant as well as a history of end-stage renal disease on dialysis. ? ?Assessment & Plan: ?Visit Diagnoses:  ?1. Gangrene of toe of right foot ([place])   ? ? ?Plan: We will refer the patient to vascular surgery for ankle-brachial indices and vascular evaluation. ? ?Follow-Up Instructions: Return if symptoms worsen or fail to improve.  ? ?Ortho Exam ? ?Patient is alert, oriented, no adenopathy, well-dressed, normal affect, normal respiratory effort. ?Examination of the right foot patient has thin atrophic skin he does not have a palpable dorsalis pedis or posterior tibial pulse.  The Doppler was used and he has a dampened biphasic anterior tibial pulse and does not have a dopplerable posterior tibial pulse.  Patient has black gangrenous changes to the tip of the third toe there is no sausage digit swelling the radiographs do not show any evidence of chronic osteomyelitis. ? ?Imaging: ?No results found. ?No images are attached to the encounter. ? ?Labs: ?Lab Results  ?Component Value Date  ? HGBA1C 4.9 11/27/2021  ? HGBA1C (H) 01/05/2011  ?  6.8 ?(NOTE)                                                                       According to the ADA Clinical Practice Recommendations for 2011, when HbA1c is used as a screening test:   >=6.5%   Diagnostic of Diabetes Mellitus           (if abnormal result ? is confirmed)  5.7-6.4%   Increased risk  of developing Diabetes Mellitus  References:Diagnosis and Classification of Diabetes Mellitus,Diabetes IONG,2952,84(XLKGM 1):S62-S69 and Standards of Medical Care in         Diabetes - 2011,Diabetes WNUU,7253,66  ?(Suppl 1):S11-S61.  ? REPTSTATUS 01/09/2011 FINAL 01/05/2011  ? CULT  01/05/2011  ?  NO SALMONELLA, SHIGELLA, CAMPYLOBACTER, OR YERSINIA ISOLATED  ? ? ? ?Lab Results  ?Component Value Date  ? ALBUMIN 3.4 (L) 02/04/2022  ? ALBUMIN 3.0 (L) 11/27/2021  ? ALBUMIN 3.4 (L) 11/26/2021  ? ? ?Lab Results  ?Component Value Date  ? MG 2.9 (H) 11/26/2021  ? MG 2.1 06/10/2021  ? MG 2.1 04/20/2014  ? ?Lab Results  ?Component Value Date  ? VD25OH 34 05/09/2011  ? ? ?No results found for: PREALBUMIN ?CBC EXTENDED Latest Ref Rng & Units 02/04/2022 11/29/2021 11/28/2021  ?WBC 4.0 - 10.5 K/uL 8.2 5.1 6.8  ?RBC 4.22 - 5.81 MIL/uL 3.56(L) 4.01(L) 4.60  ?HGB 13.0 - 17.0 g/dL 10.2(L)  11.2(L) 12.9(L)  ?HCT 39.0 - 52.0 % 33.9(L) 37.4(L) 42.5  ?PLT 150 - 400 K/uL 150 134(L) 157  ?NEUTROABS 1.7 - 7.7 K/uL 6.2 3.7 -  ?LYMPHSABS 0.7 - 4.0 K/uL 1.1 0.9 -  ? ? ? ?There is no height or weight on file to calculate BMI. ? ?Orders:  ?Orders Placed This Encounter  ?Procedures  ? Ambulatory referral to Vascular Surgery  ? ?No orders of the defined types were placed in this encounter. ? ? ? Procedures: ?No procedures performed ? ?Clinical Data: ?No additional findings. ? ?ROS: ? ?All other systems negative, except as noted in the HPI. ?Review of Systems ? ?Objective: ?Vital Signs: There were no vitals taken for this visit. ? ?Specialty Comments:  ?No specialty comments available. ? ?PMFS History: ?Patient Active Problem List  ? Diagnosis Date Noted  ? Immunocompromised ([place]) 11/29/2021  ? Hyperkalemia 11/26/2021  ? Clostridium difficile infection 04/27/2014  ? Herpes zoster 04/23/2014  ? Muscle spasm of back 04/21/2014  ? History of lung transplant ([place]) 04/21/2014  ? Diverticulitis 04/20/2014  ? CKD (chronic kidney disease) stage 5, GFR less  than 15 ml/min ([facility]) 04/20/2014  ? DIABETES MELLITUS 01/04/2008  ? DYSLIPIDEMIA 01/04/2008  ? IMMUNOCOMPROMISED 01/04/2008  ? BRONCHIECTASIS 01/04/2008  ? DYSPNEA ON EXERTION 01/04/2008  ? Complications of transplanted lung 12/10/2007  ? ?Past Medical History:  ?Diagnosis Date  ? Chronic kidney disease   ? RENAL INSUFFICIENCY  ? Diabetes mellitus without complication ([place])   ? Diverticulitis   ? History of lung transplant ([place]) 04/21/2014  ? Hypertension   ? Lung transplanted ([place]) 12/05/2002  ? BILATERAL   ? Muscle spasm of back 04/21/2014  ? Skin cancer of face   ?  ?History reviewed. No pertinent family history.  ?Past Surgical History:  ?Procedure Laterality Date  ? APPENDECTOMY    ? CATARACT EXTRACTION    ? LUNG TRANSPLANT, DOUBLE  2004  ? ?Social History  ? ?Occupational History  ? Not on file  ?Tobacco Use  ? Smoking status: Former  ?  Types: Cigarettes  ?  Quit date: 04/22/2003  ?  Years since quitting: 18.8  ? Smokeless tobacco: Never  ?Substance and Sexual Activity  ? Alcohol use: No  ?  Comment: QUIT ALCOHOL 2004  ? Drug use: No  ? Sexual activity: Not on file  ? ? ? ? ? ?

## 2022-02-11 ENCOUNTER — Encounter: Payer: Self-pay | Admitting: Surgery

## 2022-02-11 ENCOUNTER — Ambulatory Visit (HOSPITAL_COMMUNITY)
Admission: RE | Admit: 2022-02-11 | Discharge: 2022-02-11 | Disposition: A | Discharge status: Home / Self Care | Payer: Medicare Other | Source: Ambulatory Visit | Attending: Surgery | Admitting: Surgery

## 2022-02-11 ENCOUNTER — Other Ambulatory Visit: Payer: Self-pay

## 2022-02-11 ENCOUNTER — Ambulatory Visit (INDEPENDENT_AMBULATORY_CARE_PROVIDER_SITE_OTHER): Payer: Medicare Other | Admitting: Surgery

## 2022-02-11 VITALS — BP 155/63 | HR 73 | Temp 98.0°F | Resp 20 | Ht 68.0 in | Wt 120.6 lb

## 2022-02-11 DIAGNOSIS — L97501 Non-pressure chronic ulcer of other part of unspecified foot limited to breakdown of skin: Secondary | ICD-10-CM | POA: Diagnosis not present

## 2022-02-11 DIAGNOSIS — M25561 Pain in right knee: Secondary | ICD-10-CM | POA: Insufficient documentation

## 2022-02-11 DIAGNOSIS — E08621 Diabetes mellitus due to underlying condition with foot ulcer: Secondary | ICD-10-CM | POA: Diagnosis not present

## 2022-02-11 DIAGNOSIS — M25562 Pain in left knee: Secondary | ICD-10-CM | POA: Diagnosis not present

## 2022-02-11 NOTE — H&P (View-Only) (Signed)
? ?[facility] ? ?Patient name: Tyler Mueller MRN: [id number] DOB: 10/18/1941 Sex: male ? ? ?REQUESTING PROVIDER:  ? ? Dr. Sharol Given ? ? ?REASON FOR CONSULT:  ?  ?ulcer ? ?HISTORY OF PRESENT ILLNESS:  ? ?Tyler Mueller is a 81 y.o. male, who is referred for vascular evaluation in the setting of right third toe gangrene.  He states this has been present for approximately 1 month. ? ? ?Patient suffers from diabetes.  He has a history of lung transplant.  His transplant was for bronchiectasis.  He is on dialysis Tuesday Thursday Saturday ? ?PAST MEDICAL HISTORY  ? ? ?Past Medical History:  ?Diagnosis Date  ? Chronic kidney disease   ? RENAL INSUFFICIENCY  ? Diabetes mellitus without complication ([place])   ? Diverticulitis   ? History of lung transplant ([place]) 04/21/2014  ? Hypertension   ? Lung transplanted ([place]) 12/05/2002  ? BILATERAL   ? Muscle spasm of back 04/21/2014  ? Skin cancer of face   ? ? ? ?FAMILY HISTORY  ? ?History reviewed. No pertinent family history. ? ?SOCIAL HISTORY:  ? ?Social History  ? ?Socioeconomic History  ? Marital status: Married  ?  Spouse name: Not on file  ? Number of children: Not on file  ? Years of education: Not on file  ? Highest education level: Not on file  ?Occupational History  ? Not on file  ?Tobacco Use  ? Smoking status: Former  ?  Types: Cigarettes  ?  Quit date: 04/22/2003  ?  Years since quitting: 18.8  ? Smokeless tobacco: Never  ?Substance and Sexual Activity  ? Alcohol use: No  ?  Comment: QUIT ALCOHOL 2004  ? Drug use: No  ? Sexual activity: Not on file  ?Other Topics Concern  ? Not on file  ?Social History Narrative  ? Not on file  ? ?Social Determinants of Health  ? ?Financial Resource Strain: Not on file  ?Food Insecurity: Not on file  ?Transportation Needs: Not on file  ?Physical Activity: Not on file  ?Stress: Not on file  ?Social Connections: Not on file  ?Intimate Partner Violence: Not on file   ? ? ?ALLERGIES:  ? ? ?Allergies  ?Allergen Reactions  ? Lorazepam   ?   ?delirium ?  ? Morphine And Related Nausea And Vomiting and Other (See Comments)  ?  Hallucinations   ? Nsaids   ?  Other reaction(s): Kidney disorder  ? Penicillins Nausea And Vomiting and Other (See Comments)  ?  Hallucinations   ? ? ?CURRENT MEDICATIONS:  ? ? ?Current Outpatient Medications  ?Medication Sig Dispense Refill  ? acetaminophen (TYLENOL) 500 MG tablet Take 500 mg by mouth every 6 (six) hours as needed.    ? acyclovir (ZOVIRAX) 200 MG capsule Take 4 capsules (800 mg total) by mouth daily. 28 capsule 0  ? amLODipine (NORVASC) 5 MG tablet Take 5 mg by mouth daily.    ? aspirin EC 81 MG tablet Take 81 mg by mouth daily.    ? azithromycin (ZITHROMAX) 250 MG tablet MAY ADMINISTER WITHOUT REGARD TO MEALS 6 each 0  ? calcium acetate (PHOSLO) 667 MG capsule Take 667 mg by mouth 2 (two) times daily with a meal.    ? carvedilol (COREG) 25 MG tablet Take 25 mg by mouth 2 (two) times daily with a meal.    ? ferrous gluconate (FERGON) 225 (27 FE) MG tablet Take 240 mg by mouth 3 (three)  times daily with meals.    ? Multiple Vitamin (MULTIVITAMIN WITH MINERALS) TABS tablet Take 1 tablet by mouth daily.    ? multivitamin-lutein (OCUVITE-LUTEIN) CAPS capsule Take 1 capsule by mouth daily.    ? potassium chloride SA (K-DUR,KLOR-CON) 20 MEQ tablet Take 1 tablet (20 mEq total) by mouth daily. 5 tablet 0  ? pravastatin (PRAVACHOL) 40 MG tablet Take 20 mg by mouth daily.    ? predniSONE (DELTASONE) 5 MG tablet Take 5 mg by mouth 2 (two) times daily with a meal.    ? ranitidine (ZANTAC) 150 MG tablet Take 150 mg by mouth 2 (two) times daily.    ? sodium bicarbonate 650 MG tablet Take 1,300 mg by mouth 3 (three) times daily.    ? sulfamethoxazole-trimethoprim (BACTRIM,SEPTRA) 400-80 MG per tablet Take 1 tablet by mouth See admin instructions. Take 1 tablet by mouth nightly every Tuesday, Thursday, and Saturday take after dialysis. Take for Pneumocystis  jiroveci pneumonia prevention.    ? tacrolimus (PROGRAF) 0.5 MG capsule Take 0.5-1 mg by mouth See admin instructions. Take two 0.5 mg capsules(1.'0mg'$ ) by mouth in the AM and one 0.5 mg (0.'5mg'$ ) capsules in the PM    ? vancomycin (VANCOCIN) 50 mg/mL oral solution Take 2.5 mLs (125 mg total) by mouth every 6 (six) hours. 140 mL 0  ? ondansetron (ZOFRAN) 4 MG tablet Take 1 tablet (4 mg total) by mouth every 6 (six) hours as needed for nausea or vomiting. (Patient not taking: Reported on 11/26/2021) 20 tablet 0  ? ?No current facility-administered medications for this visit.  ? ? ?REVIEW OF SYSTEMS:  ? ?'[X]'$  denotes positive finding, '[ ]'$  denotes negative finding ?Cardiac  Comments:  ?Chest pain or chest pressure:    ?Shortness of breath upon exertion:    ?Short of breath when lying flat:    ?Irregular heart rhythm:    ?    ?Vascular    ?Pain in calf, thigh, or hip brought on by ambulation:    ?Pain in feet at night that wakes you up from your sleep:     ?Blood clot in your veins:    ?Leg swelling:     ?    ?Pulmonary    ?Oxygen at home:    ?Productive cough:     ?Wheezing:     ?    ?Neurologic    ?Sudden weakness in arms or legs:     ?Sudden numbness in arms or legs:     ?Sudden onset of difficulty speaking or slurred speech:    ?Temporary loss of vision in one eye:     ?Problems with dizziness:     ?    ?Gastrointestinal    ?Blood in stool:     ? ?Vomited blood:     ?    ?Genitourinary    ?Burning when urinating:     ?Blood in urine:    ?    ?Psychiatric    ?Major depression:     ?    ?Hematologic    ?Bleeding problems:    ?Problems with blood clotting too easily:    ?    ?Skin    ?Rashes or ulcers: x   ?    ?Constitutional    ?Fever or chills:    ? ?PHYSICAL EXAM:  ? ?Vitals:  ? 02/11/22 1032  ?BP: (!) 155/63  ?Pulse: 73  ?Resp: 20  ?Temp: 98 ?F (36.7 ?C)  ?SpO2: 97%  ?Weight: 120 lb 9.6 oz (54.7 kg)  ?  Height: '5\' 8"'$  (1.727 m)  ? ? ?GENERAL: The patient is a well-nourished male, in no acute distress. The vital signs are  documented above. ?CARDIAC: There is a regular rate and rhythm.  ?VASCULAR: Nonpalpable pedal pulses ?PULMONARY: Nonlabored respirations ?ABDOMEN: Soft and non-tender with normal pitched bowel sounds.  ?MUSCULOSKELETAL: There are no major deformities or cyanosis. ?NEUROLOGIC: No focal weakness or paresthesias are detected. ?SKIN: See photo below ?PSYCHIATRIC: The patient has a normal affect. ? ? ? ? ?STUDIES:  ? ?I have reviewed the following: ?+-------+-----------+-----------+------------+------------+  ?ABI/TBIToday's ABIToday's TBIPrevious ABIPrevious TBI  ?+-------+-----------+-----------+------------+------------+  ?Right  [place]         0.24                                 ?+-------+-----------+-----------+------------+------------+  ?Left   [place]         0.56                                 ?+-------+-----------+-----------+------------+------------+  ? ?Right toe pressure equals 42 ?Left toe pressure equals 96 ? ?ASSESSMENT and PLAN  ? ?Right third toe infection: I discussed that his vascular lab noninvasive studies suggest arterial insufficiency which is contributing to the inability to heal his wound.  He needs to undergo angiography via a left femoral approach and intervention as indicated for limb salvage.  I discussed the details and the risk including bleeding and embolization.  We are doing this Tuesday, March 14.  He is going to go to dialysis that morning and come in afterwards.  We did discuss that this is a limb threatening situation ? ? ?Annamarie Major, IV, MD, FACS ?[facility] ?Tel [phone] ?Pager [phone]  ?

## 2022-02-11 NOTE — H&P (View-Only) (Signed)
? ?[facility] [place] [facility] ? ?Patient name: Tyler Mueller MRN: [id number] DOB: 03/06/1941 Sex: male ? ? ?REQUESTING PROVIDER:  ? ? Dr. Sharol Given ? ? ?REASON FOR CONSULT:  ?  ?ulcer ? ?HISTORY OF PRESENT ILLNESS:  ? ?Tyler Mueller is a 81 y.o. male, who is referred for vascular evaluation in the setting of right third toe gangrene.  He states this has been present for approximately 1 month. ? ? ?Patient suffers from diabetes.  He has a history of lung transplant.  His transplant was for bronchiectasis.  He is on dialysis Tuesday Thursday Saturday ? ?PAST MEDICAL HISTORY  ? ? ?Past Medical History:  ?Diagnosis Date  ? Chronic kidney disease   ? RENAL INSUFFICIENCY  ? Diabetes mellitus without complication ([place])   ? Diverticulitis   ? History of lung transplant ([place]) 04/21/2014  ? Hypertension   ? Lung transplanted ([place]) 12/05/2002  ? BILATERAL   ? Muscle spasm of back 04/21/2014  ? Skin cancer of face   ? ? ? ?FAMILY HISTORY  ? ?History reviewed. No pertinent family history. ? ?SOCIAL HISTORY:  ? ?Social History  ? ?Socioeconomic History  ? Marital status: Married  ?  Spouse name: Not on file  ? Number of children: Not on file  ? Years of education: Not on file  ? Highest education level: Not on file  ?Occupational History  ? Not on file  ?Tobacco Use  ? Smoking status: Former  ?  Types: Cigarettes  ?  Quit date: 04/22/2003  ?  Years since quitting: 18.8  ? Smokeless tobacco: Never  ?Substance and Sexual Activity  ? Alcohol use: No  ?  Comment: QUIT ALCOHOL 2004  ? Drug use: No  ? Sexual activity: Not on file  ?Other Topics Concern  ? Not on file  ?Social History Narrative  ? Not on file  ? ?Social Determinants of Health  ? ?Financial Resource Strain: Not on file  ?Food Insecurity: Not on file  ?Transportation Needs: Not on file  ?Physical Activity: Not on file  ?Stress: Not on file  ?Social Connections: Not on file  ?Intimate Partner Violence: Not on file   ? ? ?ALLERGIES:  ? ? ?Allergies  ?Allergen Reactions  ? Lorazepam   ?   ?delirium ?  ? Morphine And Related Nausea And Vomiting and Other (See Comments)  ?  Hallucinations   ? Nsaids   ?  Other reaction(s): Kidney disorder  ? Penicillins Nausea And Vomiting and Other (See Comments)  ?  Hallucinations   ? ? ?CURRENT MEDICATIONS:  ? ? ?Current Outpatient Medications  ?Medication Sig Dispense Refill  ? acetaminophen (TYLENOL) 500 MG tablet Take 500 mg by mouth every 6 (six) hours as needed.    ? acyclovir (ZOVIRAX) 200 MG capsule Take 4 capsules (800 mg total) by mouth daily. 28 capsule 0  ? amLODipine (NORVASC) 5 MG tablet Take 5 mg by mouth daily.    ? aspirin EC 81 MG tablet Take 81 mg by mouth daily.    ? azithromycin (ZITHROMAX) 250 MG tablet MAY ADMINISTER WITHOUT REGARD TO MEALS 6 each 0  ? calcium acetate (PHOSLO) 667 MG capsule Take 667 mg by mouth 2 (two) times daily with a meal.    ? carvedilol (COREG) 25 MG tablet Take 25 mg by mouth 2 (two) times daily with a meal.    ? ferrous gluconate (FERGON) 225 (27 FE) MG tablet Take 240 mg by mouth 3 (three)  times daily with meals.    ? Multiple Vitamin (MULTIVITAMIN WITH MINERALS) TABS tablet Take 1 tablet by mouth daily.    ? multivitamin-lutein (OCUVITE-LUTEIN) CAPS capsule Take 1 capsule by mouth daily.    ? potassium chloride SA (K-DUR,KLOR-CON) 20 MEQ tablet Take 1 tablet (20 mEq total) by mouth daily. 5 tablet 0  ? pravastatin (PRAVACHOL) 40 MG tablet Take 20 mg by mouth daily.    ? predniSONE (DELTASONE) 5 MG tablet Take 5 mg by mouth 2 (two) times daily with a meal.    ? ranitidine (ZANTAC) 150 MG tablet Take 150 mg by mouth 2 (two) times daily.    ? sodium bicarbonate 650 MG tablet Take 1,300 mg by mouth 3 (three) times daily.    ? sulfamethoxazole-trimethoprim (BACTRIM,SEPTRA) 400-80 MG per tablet Take 1 tablet by mouth See admin instructions. Take 1 tablet by mouth nightly every Tuesday, Thursday, and Saturday take after dialysis. Take for Pneumocystis  jiroveci pneumonia prevention.    ? tacrolimus (PROGRAF) 0.5 MG capsule Take 0.5-1 mg by mouth See admin instructions. Take two 0.5 mg capsules(1.'0mg'$ ) by mouth in the AM and one 0.5 mg (0.'5mg'$ ) capsules in the PM    ? vancomycin (VANCOCIN) 50 mg/mL oral solution Take 2.5 mLs (125 mg total) by mouth every 6 (six) hours. 140 mL 0  ? ondansetron (ZOFRAN) 4 MG tablet Take 1 tablet (4 mg total) by mouth every 6 (six) hours as needed for nausea or vomiting. (Patient not taking: Reported on 11/26/2021) 20 tablet 0  ? ?No current facility-administered medications for this visit.  ? ? ?REVIEW OF SYSTEMS:  ? ?'[X]'$  denotes positive finding, '[ ]'$  denotes negative finding ?Cardiac  Comments:  ?Chest pain or chest pressure:    ?Shortness of breath upon exertion:    ?Short of breath when lying flat:    ?Irregular heart rhythm:    ?    ?Vascular    ?Pain in calf, thigh, or hip brought on by ambulation:    ?Pain in feet at night that wakes you up from your sleep:     ?Blood clot in your veins:    ?Leg swelling:     ?    ?Pulmonary    ?Oxygen at home:    ?Productive cough:     ?Wheezing:     ?    ?Neurologic    ?Sudden weakness in arms or legs:     ?Sudden numbness in arms or legs:     ?Sudden onset of difficulty speaking or slurred speech:    ?Temporary loss of vision in one eye:     ?Problems with dizziness:     ?    ?Gastrointestinal    ?Blood in stool:     ? ?Vomited blood:     ?    ?Genitourinary    ?Burning when urinating:     ?Blood in urine:    ?    ?Psychiatric    ?Major depression:     ?    ?Hematologic    ?Bleeding problems:    ?Problems with blood clotting too easily:    ?    ?Skin    ?Rashes or ulcers: x   ?    ?Constitutional    ?Fever or chills:    ? ?PHYSICAL EXAM:  ? ?Vitals:  ? 02/11/22 1032  ?BP: (!) 155/63  ?Pulse: 73  ?Resp: 20  ?Temp: 98 ?F (36.7 ?C)  ?SpO2: 97%  ?Weight: 120 lb 9.6 oz (54.7 kg)  ?  Height: '5\' 8"'$  (1.727 m)  ? ? ?GENERAL: The patient is a well-nourished male, in no acute distress. The vital signs are  documented above. ?CARDIAC: There is a regular rate and rhythm.  ?VASCULAR: Nonpalpable pedal pulses ?PULMONARY: Nonlabored respirations ?ABDOMEN: Soft and non-tender with normal pitched bowel sounds.  ?MUSCULOSKELETAL: There are no major deformities or cyanosis. ?NEUROLOGIC: No focal weakness or paresthesias are detected. ?SKIN: See photo below ?PSYCHIATRIC: The patient has a normal affect. ? ? ? ? ?STUDIES:  ? ?I have reviewed the following: ?+-------+-----------+-----------+------------+------------+  ?ABI/TBIToday's ABIToday's TBIPrevious ABIPrevious TBI  ?+-------+-----------+-----------+------------+------------+  ?Right  [place]         0.24                                 ?+-------+-----------+-----------+------------+------------+  ?Left   [place]         0.56                                 ?+-------+-----------+-----------+------------+------------+  ? ?Right toe pressure equals 42 ?Left toe pressure equals 96 ? ?ASSESSMENT and PLAN  ? ?Right third toe infection: I discussed that his vascular lab noninvasive studies suggest arterial insufficiency which is contributing to the inability to heal his wound.  He needs to undergo angiography via a left femoral approach and intervention as indicated for limb salvage.  I discussed the details and the risk including bleeding and embolization.  We are doing this Tuesday, March 14.  He is going to go to dialysis that morning and come in afterwards.  We did discuss that this is a limb threatening situation ? ? ?Annamarie Major, IV, MD, FACS ?[facility] ?Tel [phone] ?Pager [phone]  ?

## 2022-02-11 NOTE — Progress Notes (Signed)
? ?[facility] [place] ? ?Patient name: Tyler Mueller MRN: [id number] DOB: December 29, 1940 Sex: male ? ? ?REQUESTING PROVIDER:  ? ? Dr. Sharol Given ? ? ?REASON FOR CONSULT:  ?  ?ulcer ? ?HISTORY OF PRESENT ILLNESS:  ? ?ADARIAN BUR is a 81 y.o. male, who is referred for vascular evaluation in the setting of right third toe gangrene.  He states this has been present for approximately 1 month. ? ? ?Patient suffers from diabetes.  He has a history of lung transplant.  His transplant was for bronchiectasis.  He is on dialysis Tuesday Thursday Saturday ? ?PAST MEDICAL HISTORY  ? ? ?Past Medical History:  ?Diagnosis Date  ? Chronic kidney disease   ? RENAL INSUFFICIENCY  ? Diabetes mellitus without complication ([place])   ? Diverticulitis   ? History of lung transplant ([place]) 04/21/2014  ? Hypertension   ? Lung transplanted ([place]) 12/05/2002  ? BILATERAL   ? Muscle spasm of back 04/21/2014  ? Skin cancer of face   ? ? ? ?FAMILY HISTORY  ? ?History reviewed. No pertinent family history. ? ?SOCIAL HISTORY:  ? ?Social History  ? ?Socioeconomic History  ? Marital status: Married  ?  Spouse name: Not on file  ? Number of children: Not on file  ? Years of education: Not on file  ? Highest education level: Not on file  ?Occupational History  ? Not on file  ?Tobacco Use  ? Smoking status: Former  ?  Types: Cigarettes  ?  Quit date: 04/22/2003  ?  Years since quitting: 18.8  ? Smokeless tobacco: Never  ?Substance and Sexual Activity  ? Alcohol use: No  ?  Comment: QUIT ALCOHOL 2004  ? Drug use: No  ? Sexual activity: Not on file  ?Other Topics Concern  ? Not on file  ?Social History Narrative  ? Not on file  ? ?Social Determinants of Health  ? ?Financial Resource Strain: Not on file  ?Food Insecurity: Not on file  ?Transportation Needs: Not on file  ?Physical Activity: Not on file  ?Stress: Not on file  ?Social Connections: Not on file  ?Intimate Partner Violence: Not on file   ? ? ?ALLERGIES:  ? ? ?Allergies  ?Allergen Reactions  ? Lorazepam   ?   ?delirium ?  ? Morphine And Related Nausea And Vomiting and Other (See Comments)  ?  Hallucinations   ? Nsaids   ?  Other reaction(s): Kidney disorder  ? Penicillins Nausea And Vomiting and Other (See Comments)  ?  Hallucinations   ? ? ?CURRENT MEDICATIONS:  ? ? ?Current Outpatient Medications  ?Medication Sig Dispense Refill  ? acetaminophen (TYLENOL) 500 MG tablet Take 500 mg by mouth every 6 (six) hours as needed.    ? acyclovir (ZOVIRAX) 200 MG capsule Take 4 capsules (800 mg total) by mouth daily. 28 capsule 0  ? amLODipine (NORVASC) 5 MG tablet Take 5 mg by mouth daily.    ? aspirin EC 81 MG tablet Take 81 mg by mouth daily.    ? azithromycin (ZITHROMAX) 250 MG tablet MAY ADMINISTER WITHOUT REGARD TO MEALS 6 each 0  ? calcium acetate (PHOSLO) 667 MG capsule Take 667 mg by mouth 2 (two) times daily with a meal.    ? carvedilol (COREG) 25 MG tablet Take 25 mg by mouth 2 (two) times daily with a meal.    ? ferrous gluconate (FERGON) 225 (27 FE) MG tablet Take 240 mg by mouth 3 (three)  times daily with meals.    ? Multiple Vitamin (MULTIVITAMIN WITH MINERALS) TABS tablet Take 1 tablet by mouth daily.    ? multivitamin-lutein (OCUVITE-LUTEIN) CAPS capsule Take 1 capsule by mouth daily.    ? potassium chloride SA (K-DUR,KLOR-CON) 20 MEQ tablet Take 1 tablet (20 mEq total) by mouth daily. 5 tablet 0  ? pravastatin (PRAVACHOL) 40 MG tablet Take 20 mg by mouth daily.    ? predniSONE (DELTASONE) 5 MG tablet Take 5 mg by mouth 2 (two) times daily with a meal.    ? ranitidine (ZANTAC) 150 MG tablet Take 150 mg by mouth 2 (two) times daily.    ? sodium bicarbonate 650 MG tablet Take 1,300 mg by mouth 3 (three) times daily.    ? sulfamethoxazole-trimethoprim (BACTRIM,SEPTRA) 400-80 MG per tablet Take 1 tablet by mouth See admin instructions. Take 1 tablet by mouth nightly every Tuesday, Thursday, and Saturday take after dialysis. Take for Pneumocystis  jiroveci pneumonia prevention.    ? tacrolimus (PROGRAF) 0.5 MG capsule Take 0.5-1 mg by mouth See admin instructions. Take two 0.5 mg capsules(1.'0mg'$ ) by mouth in the AM and one 0.5 mg (0.'5mg'$ ) capsules in the PM    ? vancomycin (VANCOCIN) 50 mg/mL oral solution Take 2.5 mLs (125 mg total) by mouth every 6 (six) hours. 140 mL 0  ? ondansetron (ZOFRAN) 4 MG tablet Take 1 tablet (4 mg total) by mouth every 6 (six) hours as needed for nausea or vomiting. (Patient not taking: Reported on 11/26/2021) 20 tablet 0  ? ?No current facility-administered medications for this visit.  ? ? ?REVIEW OF SYSTEMS:  ? ?'[X]'$  denotes positive finding, '[ ]'$  denotes negative finding ?Cardiac  Comments:  ?Chest pain or chest pressure:    ?Shortness of breath upon exertion:    ?Short of breath when lying flat:    ?Irregular heart rhythm:    ?    ?Vascular    ?Pain in calf, thigh, or hip brought on by ambulation:    ?Pain in feet at night that wakes you up from your sleep:     ?Blood clot in your veins:    ?Leg swelling:     ?    ?Pulmonary    ?Oxygen at home:    ?Productive cough:     ?Wheezing:     ?    ?Neurologic    ?Sudden weakness in arms or legs:     ?Sudden numbness in arms or legs:     ?Sudden onset of difficulty speaking or slurred speech:    ?Temporary loss of vision in one eye:     ?Problems with dizziness:     ?    ?Gastrointestinal    ?Blood in stool:     ? ?Vomited blood:     ?    ?Genitourinary    ?Burning when urinating:     ?Blood in urine:    ?    ?Psychiatric    ?Major depression:     ?    ?Hematologic    ?Bleeding problems:    ?Problems with blood clotting too easily:    ?    ?Skin    ?Rashes or ulcers: x   ?    ?Constitutional    ?Fever or chills:    ? ?PHYSICAL EXAM:  ? ?Vitals:  ? 02/11/22 1032  ?BP: (!) 155/63  ?Pulse: 73  ?Resp: 20  ?Temp: 98 ?F (36.7 ?C)  ?SpO2: 97%  ?Weight: 120 lb 9.6 oz (54.7 kg)  ?  Height: '5\' 8"'$  (1.727 m)  ? ? ?GENERAL: The patient is a well-nourished male, in no acute distress. The vital signs are  documented above. ?CARDIAC: There is a regular rate and rhythm.  ?VASCULAR: Nonpalpable pedal pulses ?PULMONARY: Nonlabored respirations ?ABDOMEN: Soft and non-tender with normal pitched bowel sounds.  ?MUSCULOSKELETAL: There are no major deformities or cyanosis. ?NEUROLOGIC: No focal weakness or paresthesias are detected. ?SKIN: See photo below ?PSYCHIATRIC: The patient has a normal affect. ? ? ? ? ?STUDIES:  ? ?I have reviewed the following: ?+-------+-----------+-----------+------------+------------+  ?ABI/TBIToday's ABIToday's TBIPrevious ABIPrevious TBI  ?+-------+-----------+-----------+------------+------------+  ?Right  [place]         0.24                                 ?+-------+-----------+-----------+------------+------------+  ?Left   [place]         0.56                                 ?+-------+-----------+-----------+------------+------------+  ? ?Right toe pressure equals 42 ?Left toe pressure equals 96 ? ?ASSESSMENT and PLAN  ? ?Right third toe infection: I discussed that his vascular lab noninvasive studies suggest arterial insufficiency which is contributing to the inability to heal his wound.  He needs to undergo angiography via a left femoral approach and intervention as indicated for limb salvage.  I discussed the details and the risk including bleeding and embolization.  We are doing this Tuesday, March 14.  He is going to go to dialysis that morning and come in afterwards.  We did discuss that this is a limb threatening situation ? ? ?Annamarie Major, IV, MD, FACS ?[facility] ?Tel [phone] ?Pager [phone]  ?

## 2022-02-15 ENCOUNTER — Telehealth: Payer: Self-pay

## 2022-02-15 NOTE — Telephone Encounter (Signed)
Received a call from patient's wife. Reports he has been sick with vomiting and diarrhea since last night, unable to make angiogram procedure today. Patient rescheduled for 02/22/22. Instructions reviewed. Wife verbalized understanding. Provider informed.  ?

## 2022-02-22 ENCOUNTER — Other Ambulatory Visit: Payer: Self-pay

## 2022-02-22 ENCOUNTER — Ambulatory Visit (HOSPITAL_COMMUNITY)
Admission: RE | Admit: 2022-02-22 | Discharge: 2022-02-22 | Disposition: A | Discharge status: Home / Self Care | Payer: Medicare Other | Source: Home / Self Care | Attending: Surgery | Admitting: Surgery

## 2022-02-22 ENCOUNTER — Encounter (HOSPITAL_COMMUNITY)
Admission: RE | Disposition: A | Discharge status: Home / Self Care | Payer: Self-pay | Source: Home / Self Care | Attending: Surgery

## 2022-02-22 ENCOUNTER — Encounter (HOSPITAL_COMMUNITY): Payer: Self-pay | Admitting: Surgery

## 2022-02-22 ENCOUNTER — Ambulatory Visit (HOSPITAL_BASED_OUTPATIENT_CLINIC_OR_DEPARTMENT_OTHER): Payer: Medicare Other

## 2022-02-22 DIAGNOSIS — Z79899 Other long term (current) drug therapy: Secondary | ICD-10-CM | POA: Insufficient documentation

## 2022-02-22 DIAGNOSIS — I70261 Atherosclerosis of native arteries of extremities with gangrene, right leg: Secondary | ICD-10-CM | POA: Insufficient documentation

## 2022-02-22 DIAGNOSIS — E1122 Type 2 diabetes mellitus with diabetic chronic kidney disease: Secondary | ICD-10-CM | POA: Insufficient documentation

## 2022-02-22 DIAGNOSIS — E1152 Type 2 diabetes mellitus with diabetic peripheral angiopathy with gangrene: Secondary | ICD-10-CM | POA: Insufficient documentation

## 2022-02-22 DIAGNOSIS — N189 Chronic kidney disease, unspecified: Secondary | ICD-10-CM | POA: Insufficient documentation

## 2022-02-22 DIAGNOSIS — Z7982 Long term (current) use of aspirin: Secondary | ICD-10-CM | POA: Insufficient documentation

## 2022-02-22 DIAGNOSIS — E11621 Type 2 diabetes mellitus with foot ulcer: Secondary | ICD-10-CM | POA: Insufficient documentation

## 2022-02-22 DIAGNOSIS — Z87891 Personal history of nicotine dependence: Secondary | ICD-10-CM | POA: Insufficient documentation

## 2022-02-22 DIAGNOSIS — I129 Hypertensive chronic kidney disease with stage 1 through stage 4 chronic kidney disease, or unspecified chronic kidney disease: Secondary | ICD-10-CM | POA: Insufficient documentation

## 2022-02-22 DIAGNOSIS — Z01810 Encounter for preprocedural cardiovascular examination: Secondary | ICD-10-CM | POA: Diagnosis not present

## 2022-02-22 DIAGNOSIS — L97519 Non-pressure chronic ulcer of other part of right foot with unspecified severity: Secondary | ICD-10-CM | POA: Insufficient documentation

## 2022-02-22 DIAGNOSIS — I70235 Atherosclerosis of native arteries of right leg with ulceration of other part of foot: Secondary | ICD-10-CM | POA: Insufficient documentation

## 2022-02-22 DIAGNOSIS — Z992 Dependence on renal dialysis: Secondary | ICD-10-CM | POA: Insufficient documentation

## 2022-02-22 DIAGNOSIS — Z942 Lung transplant status: Secondary | ICD-10-CM | POA: Insufficient documentation

## 2022-02-22 HISTORY — PX: ABDOMINAL AORTOGRAM W/LOWER EXTREMITY: CATH118223

## 2022-02-22 HISTORY — PX: PERIPHERAL VASCULAR BALLOON ANGIOPLASTY: CATH118281

## 2022-02-22 LAB — POCT I-STAT, CHEM 8
BUN: 22 mg/dL (ref 8–23)
Calcium, Ion: 1.16 mmol/L (ref 1.15–1.40)
Chloride: 100 mmol/L (ref 98–111)
Creatinine, Ser: 4.4 mg/dL — ABNORMAL HIGH (ref 0.61–1.24)
Glucose, Bld: 130 mg/dL — ABNORMAL HIGH (ref 70–99)
HCT: 35 % — ABNORMAL LOW (ref 39.0–52.0)
Hemoglobin: 11.9 g/dL — ABNORMAL LOW (ref 13.0–17.0)
Potassium: 3.9 mmol/L (ref 3.5–5.1)
Sodium: 140 mmol/L (ref 135–145)
TCO2: 30 mmol/L (ref 22–32)

## 2022-02-22 LAB — SURGICAL PCR SCREEN
MRSA, PCR: NEGATIVE
Staphylococcus aureus: POSITIVE — AB

## 2022-02-22 LAB — PROTIME-INR
INR: 1.2 (ref 0.8–1.2)
Prothrombin Time: 14.9 seconds (ref 11.4–15.2)

## 2022-02-22 SURGERY — ABDOMINAL AORTOGRAM W/LOWER EXTREMITY
Anesthesia: LOCAL | Laterality: Right

## 2022-02-22 MED ORDER — LABETALOL HCL 5 MG/ML IV SOLN: 10.0000 mg | INTRAVENOUS | Status: DC | PRN

## 2022-02-22 MED ORDER — OXYCODONE HCL 5 MG PO TABS: 5.0000 mg | ORAL_TABLET | ORAL | Status: DC | PRN

## 2022-02-22 MED ORDER — ONDANSETRON HCL 4 MG/2ML IJ SOLN
4.0000 mg | Freq: Four times a day (QID) | INTRAMUSCULAR | Status: DC | PRN
Start: 2022-02-22 — End: 2022-02-22

## 2022-02-22 MED ORDER — HEPARIN SODIUM (PORCINE) 1000 UNIT/ML IJ SOLN
INTRAMUSCULAR | Status: DC | PRN
Start: 2022-02-22 — End: 2022-02-22
  Administered 2022-02-22: 5500 [IU] via INTRAVENOUS

## 2022-02-22 MED ORDER — SODIUM CHLORIDE 0.9% FLUSH
3.0000 mL | INTRAVENOUS | Status: DC | PRN
Start: 2022-02-22 — End: 2022-02-22

## 2022-02-22 MED ORDER — LIDOCAINE HCL (PF) 1 % IJ SOLN
INTRAMUSCULAR | Status: AC
  Filled 2022-02-22: qty 30

## 2022-02-22 MED ORDER — HEPARIN SODIUM (PORCINE) 1000 UNIT/ML IJ SOLN
INTRAMUSCULAR | Status: AC
  Filled 2022-02-22: qty 10

## 2022-02-22 MED ORDER — HEPARIN (PORCINE) IN NACL 1000-0.9 UT/500ML-% IV SOLN
INTRAVENOUS | Status: AC
  Filled 2022-02-22: qty 1000

## 2022-02-22 MED ORDER — LIDOCAINE HCL (PF) 1 % IJ SOLN
INTRAMUSCULAR | Status: DC | PRN
  Administered 2022-02-22: 12 mL

## 2022-02-22 MED ORDER — SODIUM CHLORIDE 0.9% FLUSH: 3.0000 mL | INTRAVENOUS | Status: DC | PRN

## 2022-02-22 MED ORDER — SODIUM CHLORIDE 0.9 % IV SOLN: 250.0000 mL | INTRAVENOUS | Status: DC | PRN

## 2022-02-22 MED ORDER — SODIUM CHLORIDE 0.9% FLUSH
3.0000 mL | Freq: Two times a day (BID) | INTRAVENOUS | Status: DC
Start: 2022-02-22 — End: 2022-02-22

## 2022-02-22 MED ORDER — FENTANYL CITRATE (PF) 100 MCG/2ML IJ SOLN
INTRAMUSCULAR | Status: DC | PRN
Start: 2022-02-22 — End: 2022-02-22
  Administered 2022-02-22: 50 ug via INTRAVENOUS

## 2022-02-22 MED ORDER — SODIUM CHLORIDE 0.9% FLUSH: 3.0000 mL | Freq: Two times a day (BID) | INTRAVENOUS | Status: DC

## 2022-02-22 MED ORDER — FENTANYL CITRATE (PF) 100 MCG/2ML IJ SOLN
INTRAMUSCULAR | Status: AC
  Filled 2022-02-22: qty 2

## 2022-02-22 MED ORDER — IODIXANOL 320 MG/ML IV SOLN
INTRAVENOUS | Status: DC | PRN
  Administered 2022-02-22: 60 mL via INTRA_ARTERIAL

## 2022-02-22 MED ORDER — MIDAZOLAM HCL 2 MG/2ML IJ SOLN
INTRAMUSCULAR | Status: AC
  Filled 2022-02-22: qty 2

## 2022-02-22 MED ORDER — HEPARIN (PORCINE) IN NACL 1000-0.9 UT/500ML-% IV SOLN
INTRAVENOUS | Status: DC | PRN
  Administered 2022-02-22 (×2): 500 mL

## 2022-02-22 MED ORDER — MIDAZOLAM HCL 2 MG/2ML IJ SOLN
INTRAMUSCULAR | Status: DC | PRN
  Administered 2022-02-22: 2 mg via INTRAVENOUS

## 2022-02-22 MED ORDER — ACETAMINOPHEN 325 MG PO TABS: 650.0000 mg | ORAL_TABLET | ORAL | Status: DC | PRN

## 2022-02-22 MED ORDER — HYDRALAZINE HCL 20 MG/ML IJ SOLN: 5.0000 mg | INTRAMUSCULAR | Status: DC | PRN

## 2022-02-22 SURGICAL SUPPLY — 15 items
CATH OMNI FLUSH 5F 65CM (CATHETERS) ×1 IMPLANT
CATH QUICKCROSS SUPP .035X90CM (MICROCATHETER) ×1 IMPLANT
DEVICE TORQUE H2O (MISCELLANEOUS) ×1 IMPLANT
DEVICE VASC CLSR CELT ART 6 (Vascular Products) ×1 IMPLANT
GLIDEWIRE ADV .035X260CM (WIRE) ×1 IMPLANT
GUIDEWIRE ANGLED .035X260CM (WIRE) ×1 IMPLANT
KIT MICROPUNCTURE NIT STIFF (SHEATH) ×1 IMPLANT
KIT PV (KITS) ×2 IMPLANT
SHEATH HIGHFLEX ANSEL 6FRX55 (SHEATH) ×1 IMPLANT
SHEATH PINNACLE 5F 10CM (SHEATH) ×1 IMPLANT
SHEATH PINNACLE 6F 10CM (SHEATH) ×1 IMPLANT
SYR MEDRAD MARK V 150ML (SYRINGE) ×1 IMPLANT
TRANSDUCER W/STOPCOCK (MISCELLANEOUS) ×2 IMPLANT
TRAY PV CATH (CUSTOM PROCEDURE TRAY) ×2 IMPLANT
WIRE BENTSON .035X145CM (WIRE) ×1 IMPLANT

## 2022-02-22 NOTE — Interval H&P Note (Signed)
History and Physical Interval Note: ? ?02/22/2022 ?9:00 AM ? 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  The various methods of treatment have been discussed with the patient and family. After consideration of risks, benefits and other options for treatment, the patient has consented to  Procedure(s): ?ABDOMINAL AORTOGRAM W/LOWER EXTREMITY (N/A) as a surgical intervention.  The patient's history has been reviewed, patient examined, no change in status, stable for surgery.  I have reviewed the patient's chart and labs.  Questions were answered to the patient's satisfaction.   ? ? ?[place] Kienan Doublin ? ? ?

## 2022-02-22 NOTE — Progress Notes (Signed)
Right lower extremity vein mapping completed. ?Refer to "CV Proc" under chart review to view preliminary results. ? ?02/22/2022 2:41 PM ?Kelby Aline., MHA, RVT, RDCS, RDMS   ?

## 2022-02-22 NOTE — Anesthesia Preprocedure Evaluation (Addendum)
Anesthesia Evaluation  ?Patient identified by MRN, date of birth, ID band ?Patient awake ? ? ? ?Reviewed: ?Allergy & Precautions, NPO status , Patient's Chart, lab work & pertinent test results ? ?Airway ?Mallampati: II ? ?TM Distance: >3 FB ?Neck ROM: Full ? ? ? Dental ? ?(+) Edentulous Upper, Edentulous Lower, Dental Advisory Given ?  ?Pulmonary ?neg pulmonary ROS, former smoker,  ?  ?Pulmonary exam normal ?breath sounds clear to auscultation ? ? ? ? ? ? Cardiovascular ?hypertension, Pt. on home beta blockers ? ?Rhythm:Regular Rate:Normal ?+ Systolic murmurs ? ?  ?Neuro/Psych ?negative neurological ROS ?   ? GI/Hepatic ?negative GI ROS, Neg liver ROS,   ?Endo/Other  ?diabetes ? Renal/GU ?CRF and Renal InsufficiencyRenal disease  ? ?  ?Musculoskeletal ?negative musculoskeletal ROS ?(+)  ? Abdominal ?  ?Peds ? Hematology ?negative hematology ROS ?(+)   ?Anesthesia Other Findings ? ? Reproductive/Obstetrics ? ?  ? ? ? ? ? ? ? ? ? ? ? ? ? ?  ?  ? ? ? ? ? ? ? ?Anesthesia Physical ?Anesthesia Plan ? ?ASA: 3 ? ?Anesthesia Plan: General  ? ?Post-op Pain Management: Tylenol PO (pre-op)*  ? ?Induction: Intravenous ? ?PONV Risk Score and Plan: 3 and Ondansetron, Dexamethasone and Treatment may vary due to age or medical condition ? ?Airway Management Planned: Oral ETT ? ?Additional Equipment:  ? ?Intra-op Plan:  ? ?Post-operative Plan: Extubation in OR ? ?Informed Consent: I have reviewed the patients History and Physical, chart, labs and discussed the procedure including the risks, benefits and alternatives for the proposed anesthesia with the patient or authorized representative who has indicated his/her understanding and acceptance.  ? ? ? ?Dental advisory given ? ?Plan Discussed with: CRNA ? ?Anesthesia Plan Comments:   ? ? ? ? ?Anesthesia Quick Evaluation ? ?

## 2022-02-22 NOTE — Op Note (Signed)
? ? ?  Patient name: Tyler Mueller MRN: [id number] DOB: 05-10-1941 Sex: male ? ?02/22/2022 ?Pre-operative Diagnosis: Right toe ulcer ?Post-operative diagnosis:  Same ?Surgeon:  Annamarie Major ?Procedure Performed: ? 1.  Ultrasound-guided access, left femoral artery ? 2.  Abdominal aortogram ? 3.  Right lower extremity runoff ? 4.  Failed right popliteal angioplasty ? 5.  Conscious sedation, 42 minutes ? 6.  Closure device, Celt ? ? ?Indications: This is a 81 year old gentleman with a third toe wound on the right.  He comes in today for angiographic evaluation ? ?Procedure:  The patient was identified in the holding area and taken to room 8.  The patient was then placed supine on the table and prepped and draped in the usual sterile fashion.  A time out was called.  Conscious sedation was administered with the use of IV fentanyl and Versed under continuous physician and nurse monitoring.  Heart rate, blood pressure, and oxygen saturation were continuously monitored.  Total sedation time was 42 minutes.  Ultrasound was used to evaluate the left common femoral artery.  It was patent .  A digital ultrasound image was acquired.  A micropuncture needle was used to access the left common femoral artery under ultrasound guidance.  An 018 wire was advanced without resistance and a micropuncture sheath was placed.  The 018 wire was removed and a benson wire was placed.  The micropuncture sheath was exchanged for a 5 french sheath.  An omniflush catheter was advanced over the wire to the level of L-1.  An abdominal angiogram was obtained.  Next, using the omniflush catheter and a benson wire, the aortic bifurcation was crossed and the catheter was placed into theright external iliac artery and right runoff was obtained.   ?Findings:  ? Aortogram: No significant aortic stenosis.  Bilateral common and external iliac arteries are widely patent ? Right Lower Extremity: Right common femoral profundofemoral artery widely patent.  The  superficial femoral artery is heavily calcified with patent throughout its course.  The popliteal artery is occluded with reconstitution of the below-knee popliteal artery.  The dominant runoff is anterior tibial artery which occludes at the ankle.  There is diffuse disease out onto the foot. ?  ?Intervention: After the above images were acquired the decision was made to proceed with intervention.  A 6 French 55 cm Ansell 1 sheath was advanced into the right superficial femoral artery.  I then attempted to cross the popliteal occlusion using a quick cross catheter and a Glidewire and a Glidewire advantage.  Ultimately I was unsuccessful in gaining reentry.  I had created a false lumen and did not feel that I would ever get back in.  Because of the disease on his foot, I did not feel he was a good pedal access.  The groin was then closed with a Celt. ? ?Impression: ? 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 #2  Patient will be evaluated for a right femoral to below-knee popliteal artery bypass graft ? #3  Dominant runoff is anterior tibial.  This occludes at the ankle.  There is diffuse disease out onto the foot. ? ? ?V. Annamarie Major, M.D., FACS ?[facility] ?Office: [phone] ?Pager:  [phone]  ?

## 2022-02-22 NOTE — Pre-Procedure Instructions (Signed)
Surgical Instructions ? ? ? Your procedure is scheduled on Wednesday, March 22nd. ? Report to [facility] Main Entrance "A" at 9:00 A.M., then check in with the Admitting office. ? Call this number if you have problems the morning of surgery: ? [phone] ? ? If you have any questions prior to your surgery date call [phone]: Open Monday-Friday 8am-4pm ? ? ? Remember: ? Do not eat or drink after midnight the night before your surgery ?  ? Take these medicines the morning of surgery with A SIP OF WATER:  ?acyclovir (ZOVIRAX) ?amLODipine (NORVASC) ?aspirin EC ?azithromycin (ZITHROMAX) ?carvedilol (COREG)  ?famotidine (PEPCID)  ?predniSONE (DELTASONE)  ?tacrolimus (PROGRAF) ?acetaminophen (TYLENOL)-as needed. ? ?As of today, STOP taking any Aspirin (unless otherwise instructed by your surgeon) Aleve, Naproxen, Ibuprofen, Motrin, Advil, Goody's, BC's, all herbal medications, fish oil, and all vitamins. ? ?WHAT DO I DO ABOUT MY DIABETES MEDICATION? ? ?If your CBG is greater than 220 mg/dL, you may take ? of your sliding scale (correction) dose of insulin. ? ? ?HOW TO MANAGE YOUR DIABETES ?BEFORE AND AFTER SURGERY ? ?Why is it important to control my blood sugar before and after surgery? ?Improving blood sugar levels before and after surgery helps healing and can limit problems. ?A way of improving blood sugar control is eating a healthy diet by: ? Eating less sugar and carbohydrates ? Increasing activity/exercise ? Talking with your doctor about reaching your blood sugar goals ?High blood sugars (greater than 180 mg/dL) can raise your risk of infections and slow your recovery, so you will need to focus on controlling your diabetes during the weeks before surgery. ?Make sure that the doctor who takes care of your diabetes knows about your planned surgery including the date and location. ? ?How do I manage my blood sugar before surgery? ?Check your blood sugar at least 4 times a day, starting 2 days before surgery, to  make sure that the level is not too high or low. ? ?Check your blood sugar the morning of your surgery when you wake up and every 2 hours until you get to the Short Stay unit. ? ?If your blood sugar is less than 70 mg/dL, you will need to treat for low blood sugar: ?Do not take insulin. ?Treat a low blood sugar (less than 70 mg/dL) with ? cup of clear juice (cranberry or apple), 4 glucose tablets, OR glucose gel. ?Recheck blood sugar in 15 minutes after treatment (to make sure it is greater than 70 mg/dL). If your blood sugar is not greater than 70 mg/dL on recheck, call [phone] for further instructions. ?Report your blood sugar to the short stay nurse when you get to Short Stay. ? ?If you are admitted to the hospital after surgery: ?Your blood sugar will be checked by the staff and you will probably be given insulin after surgery (instead of oral diabetes medicines) to make sure you have good blood sugar levels. ?The goal for blood sugar control after surgery is 80-180 mg/dL. ? ? ?         ?Do not wear jewelry. ?Do not wear lotions, powders, colognes, or deodorant. ?Men may shave face and neck. ?Do not bring valuables to the hospital. ? ?[place] is not responsible for any belongings or valuables. .  ? ?Do NOT Smoke (Tobacco/Vaping)  24 hours prior to your procedure ? ?If you use a CPAP at night, you may bring your mask for your overnight stay. ?  ?Contacts, glasses, hearing aids, dentures or  partials may not be worn into surgery, please bring cases for these belongings ?  ?For patients admitted to the hospital, discharge time will be determined by your treatment team. ?  ?Patients discharged the day of surgery will not be allowed to drive home, and someone needs to stay with them for 24 hours. ? ? ?SURGICAL WAITING ROOM VISITATION ?Patients having surgery or a procedure in a hospital may have two support people. ?Children under the age of 71 must have an adult with them who is not the patient. ?They may  stay in the waiting area during the procedure and may switch out with other visitors. If the patient needs to stay at the hospital during part of their recovery, the visitor guidelines for inpatient rooms apply. ? ?Please refer to the [place] website for the visitor guidelines for Inpatients (after your surgery is over and you are in a regular room).  ? ? ? ? ? ?Special instructions:   ? ?Oral Hygiene is also important to reduce your risk of infection.  Remember - BRUSH YOUR TEETH THE MORNING OF SURGERY WITH YOUR REGULAR TOOTHPASTE ? ? ?[place]- Preparing For Surgery ? ?Before surgery, you can play an important role. Because skin is not sterile, your skin needs to be as free of germs as possible. You can reduce the number of germs on your skin by washing with CHG (chlorahexidine gluconate) Soap before surgery.  CHG is an antiseptic cleaner which kills germs and bonds with the skin to continue killing germs even after washing.   ? ? ?Please do not use if you have an allergy to CHG or antibacterial soaps. If your skin becomes reddened/irritated stop using the CHG.  ?Do not shave (including legs and underarms) for at least 48 hours prior to first CHG shower. It is OK to shave your face. ? ?Please follow these instructions carefully. ?  ? ? Shower the NIGHT BEFORE SURGERY and the MORNING OF SURGERY with CHG Soap.  ? If you chose to wash your hair, wash your hair first as usual with your normal shampoo. After you shampoo, rinse your hair and body thoroughly to remove the shampoo.  Then [employer] and genitals (private parts) with your normal soap and rinse thoroughly to remove soap. ? ?After that Use CHG Soap as you would any other liquid soap. You can apply CHG directly to the skin and wash gently with a scrungie or a clean washcloth.  ? ?Apply the CHG Soap to your body ONLY FROM THE NECK DOWN.  Do not use on open wounds or open sores. Avoid contact with your eyes, ears, mouth and genitals (private parts). Wash  Face and genitals (private parts)  with your normal soap.  ? ?Wash thoroughly, paying special attention to the area where your surgery will be performed. ? ?Thoroughly rinse your body with warm water from the neck down. ? ?DO NOT shower/wash with your normal soap after using and rinsing off the CHG Soap. ? ?Pat yourself dry with a CLEAN TOWEL. ? ?Wear CLEAN PAJAMAS to bed the night before surgery ? ?Place CLEAN SHEETS on your bed the night before your surgery ? ?DO NOT SLEEP WITH PETS. ? ? ?Day of Surgery: ? ?Take a shower with CHG soap. ?Wear Clean/Comfortable clothing the morning of surgery ?Do not apply any deodorants/lotions.   ?Remember to brush your teeth WITH YOUR REGULAR TOOTHPASTE. ? ? ? ?If you received a COVID test during your pre-op visit  it is requested that  you wear a mask when out in public, stay away from anyone that may not be feeling well and notify your surgeon if you develop symptoms. If you have been in contact with anyone that has tested positive in the last 10 days please notify you surgeon. ? ?  ?Please read over the following fact sheets that you were given.  ? ?

## 2022-02-22 NOTE — Progress Notes (Signed)
Pt confirmed to have had procedure done today. Pt and pt wife confirmed that they received pre-op instructions. Pt confirmed medical hx and medications are unchanged. Wife verbalized instructions she was given. Instructions matched pre-op instruction note done by Lilia Pro, Therapist, sports. Pt wife also confirmed to have been given the CHG hibiclens soap for pt to wash tonight and DOS prior to arrival. Updated pt wife and pt that arrival time should be 0530 tomorrow 3/22 for surgery and not 0900 as they were originally told. No further questions from pt or pt wife.  ?

## 2022-02-23 ENCOUNTER — Inpatient Hospital Stay (HOSPITAL_COMMUNITY): Payer: Medicare Other | Admitting: Certified Registered Nurse Anesthetist

## 2022-02-23 ENCOUNTER — Encounter (HOSPITAL_COMMUNITY)
Admission: RE | Disposition: A | Discharge status: Home Health | Payer: Self-pay | Source: Home / Self Care | Attending: Surgery

## 2022-02-23 ENCOUNTER — Encounter (HOSPITAL_COMMUNITY): Payer: Self-pay | Admitting: Surgery

## 2022-02-23 ENCOUNTER — Other Ambulatory Visit: Payer: Self-pay

## 2022-02-23 ENCOUNTER — Inpatient Hospital Stay (HOSPITAL_COMMUNITY)
Admission: RE | Admit: 2022-02-23 | Discharge: 2022-03-01 | DRG: 239 | Disposition: A | Discharge status: Home Health | Payer: Medicare Other | Attending: Surgery | Admitting: Surgery

## 2022-02-23 DIAGNOSIS — I739 Peripheral vascular disease, unspecified: Secondary | ICD-10-CM

## 2022-02-23 DIAGNOSIS — I10 Essential (primary) hypertension: Secondary | ICD-10-CM

## 2022-02-23 DIAGNOSIS — Z87891 Personal history of nicotine dependence: Secondary | ICD-10-CM | POA: Diagnosis not present

## 2022-02-23 DIAGNOSIS — Z886 Allergy status to analgesic agent status: Secondary | ICD-10-CM | POA: Diagnosis not present

## 2022-02-23 DIAGNOSIS — D631 Anemia in chronic kidney disease: Secondary | ICD-10-CM | POA: Diagnosis present

## 2022-02-23 DIAGNOSIS — I70261 Atherosclerosis of native arteries of extremities with gangrene, right leg: Secondary | ICD-10-CM | POA: Diagnosis present

## 2022-02-23 DIAGNOSIS — Z7960 Long term (current) use of unspecified immunomodulators and immunosuppressants: Secondary | ICD-10-CM

## 2022-02-23 DIAGNOSIS — L089 Local infection of the skin and subcutaneous tissue, unspecified: Secondary | ICD-10-CM | POA: Diagnosis present

## 2022-02-23 DIAGNOSIS — Z7982 Long term (current) use of aspirin: Secondary | ICD-10-CM

## 2022-02-23 DIAGNOSIS — E11649 Type 2 diabetes mellitus with hypoglycemia without coma: Secondary | ICD-10-CM | POA: Diagnosis not present

## 2022-02-23 DIAGNOSIS — Z942 Lung transplant status: Secondary | ICD-10-CM | POA: Diagnosis not present

## 2022-02-23 DIAGNOSIS — N189 Chronic kidney disease, unspecified: Secondary | ICD-10-CM

## 2022-02-23 DIAGNOSIS — N2581 Secondary hyperparathyroidism of renal origin: Secondary | ICD-10-CM | POA: Diagnosis present

## 2022-02-23 DIAGNOSIS — D649 Anemia, unspecified: Secondary | ICD-10-CM

## 2022-02-23 DIAGNOSIS — Z79899 Other long term (current) drug therapy: Secondary | ICD-10-CM

## 2022-02-23 DIAGNOSIS — Z85828 Personal history of other malignant neoplasm of skin: Secondary | ICD-10-CM

## 2022-02-23 DIAGNOSIS — E11621 Type 2 diabetes mellitus with foot ulcer: Secondary | ICD-10-CM | POA: Diagnosis present

## 2022-02-23 DIAGNOSIS — Z95828 Presence of other vascular implants and grafts: Secondary | ICD-10-CM

## 2022-02-23 DIAGNOSIS — N186 End stage renal disease: Secondary | ICD-10-CM

## 2022-02-23 DIAGNOSIS — M898X9 Other specified disorders of bone, unspecified site: Secondary | ICD-10-CM | POA: Diagnosis present

## 2022-02-23 DIAGNOSIS — D84821 Immunodeficiency due to drugs: Secondary | ICD-10-CM | POA: Diagnosis present

## 2022-02-23 DIAGNOSIS — E1142 Type 2 diabetes mellitus with diabetic polyneuropathy: Secondary | ICD-10-CM | POA: Diagnosis present

## 2022-02-23 DIAGNOSIS — R54 Age-related physical debility: Secondary | ICD-10-CM | POA: Diagnosis present

## 2022-02-23 DIAGNOSIS — I129 Hypertensive chronic kidney disease with stage 1 through stage 4 chronic kidney disease, or unspecified chronic kidney disease: Secondary | ICD-10-CM

## 2022-02-23 DIAGNOSIS — I120 Hypertensive chronic kidney disease with stage 5 chronic kidney disease or end stage renal disease: Secondary | ICD-10-CM | POA: Diagnosis present

## 2022-02-23 DIAGNOSIS — Z888 Allergy status to other drugs, medicaments and biological substances status: Secondary | ICD-10-CM

## 2022-02-23 DIAGNOSIS — M869 Osteomyelitis, unspecified: Secondary | ICD-10-CM

## 2022-02-23 DIAGNOSIS — I953 Hypotension of hemodialysis: Secondary | ICD-10-CM | POA: Diagnosis not present

## 2022-02-23 DIAGNOSIS — Z880 Allergy status to penicillin: Secondary | ICD-10-CM

## 2022-02-23 DIAGNOSIS — Z992 Dependence on renal dialysis: Secondary | ICD-10-CM | POA: Diagnosis not present

## 2022-02-23 DIAGNOSIS — G9341 Metabolic encephalopathy: Secondary | ICD-10-CM | POA: Diagnosis not present

## 2022-02-23 DIAGNOSIS — E1152 Type 2 diabetes mellitus with diabetic peripheral angiopathy with gangrene: Principal | ICD-10-CM | POA: Diagnosis present

## 2022-02-23 DIAGNOSIS — L97519 Non-pressure chronic ulcer of other part of right foot with unspecified severity: Secondary | ICD-10-CM

## 2022-02-23 DIAGNOSIS — Z7952 Long term (current) use of systemic steroids: Secondary | ICD-10-CM

## 2022-02-23 DIAGNOSIS — E785 Hyperlipidemia, unspecified: Secondary | ICD-10-CM | POA: Diagnosis present

## 2022-02-23 DIAGNOSIS — M86171 Other acute osteomyelitis, right ankle and foot: Secondary | ICD-10-CM | POA: Diagnosis present

## 2022-02-23 DIAGNOSIS — E1122 Type 2 diabetes mellitus with diabetic chronic kidney disease: Secondary | ICD-10-CM | POA: Diagnosis present

## 2022-02-23 DIAGNOSIS — E1169 Type 2 diabetes mellitus with other specified complication: Secondary | ICD-10-CM | POA: Diagnosis present

## 2022-02-23 DIAGNOSIS — J479 Bronchiectasis, uncomplicated: Secondary | ICD-10-CM

## 2022-02-23 DIAGNOSIS — E876 Hypokalemia: Secondary | ICD-10-CM | POA: Diagnosis not present

## 2022-02-23 DIAGNOSIS — R6889 Other general symptoms and signs: Secondary | ICD-10-CM | POA: Diagnosis not present

## 2022-02-23 HISTORY — PX: AMPUTATION TOE: SHX6595

## 2022-02-23 HISTORY — PX: FEMORAL-POPLITEAL BYPASS GRAFT: SHX937

## 2022-02-23 HISTORY — PX: ENDARTERECTOMY FEMORAL: SHX5804

## 2022-02-23 HISTORY — PX: APPLICATION OF WOUND VAC: SHX5189

## 2022-02-23 HISTORY — DX: End stage renal disease: N18.6

## 2022-02-23 HISTORY — DX: End stage renal disease: Z99.2

## 2022-02-23 LAB — CREATININE, SERUM
Creatinine, Ser: 5.9 mg/dL — ABNORMAL HIGH (ref 0.61–1.24)
GFR, Estimated: 9 mL/min — ABNORMAL LOW (ref >60)

## 2022-02-23 LAB — CBC
HCT: 29.7 % — ABNORMAL LOW (ref 39.0–52.0)
Hemoglobin: 9.3 g/dL — ABNORMAL LOW (ref 13.0–17.0)
MCH: 30.3 pg (ref 26.0–34.0)
MCHC: 31.3 g/dL (ref 30.0–36.0)
MCV: 96.7 fL (ref 80.0–100.0)
Platelets: 214 10*3/uL (ref 150–400)
RBC: 3.07 MIL/uL — ABNORMAL LOW (ref 4.22–5.81)
RDW: 19.8 % — ABNORMAL HIGH (ref 11.5–15.5)
WBC: 9.9 10*3/uL (ref 4.0–10.5)
nRBC: 0.2 % (ref 0.0–0.2)

## 2022-02-23 LAB — POCT I-STAT, CHEM 8
BUN: 29 mg/dL — ABNORMAL HIGH (ref 8–23)
Calcium, Ion: 1.11 mmol/L — ABNORMAL LOW (ref 1.15–1.40)
Chloride: 102 mmol/L (ref 98–111)
Creatinine, Ser: 5.9 mg/dL — ABNORMAL HIGH (ref 0.61–1.24)
Glucose, Bld: 58 mg/dL — ABNORMAL LOW (ref 70–99)
HCT: 33 % — ABNORMAL LOW (ref 39.0–52.0)
Hemoglobin: 11.2 g/dL — ABNORMAL LOW (ref 13.0–17.0)
Potassium: 4.5 mmol/L (ref 3.5–5.1)
Sodium: 137 mmol/L (ref 135–145)
TCO2: 26 mmol/L (ref 22–32)

## 2022-02-23 LAB — HEMOGLOBIN A1C
Hgb A1c MFr Bld: 5.8 % — ABNORMAL HIGH (ref 4.8–5.6)
Mean Plasma Glucose: 119.76 mg/dL

## 2022-02-23 LAB — GLUCOSE, CAPILLARY
Glucose-Capillary: 74 mg/dL (ref 70–99)
Glucose-Capillary: 169 mg/dL — ABNORMAL HIGH (ref 70–99)
Glucose-Capillary: 229 mg/dL — ABNORMAL HIGH (ref 70–99)
Glucose-Capillary: 258 mg/dL — ABNORMAL HIGH (ref 70–99)

## 2022-02-23 LAB — HEPATITIS B SURFACE ANTIBODY,QUALITATIVE: Hep B S Ab: NONREACTIVE

## 2022-02-23 LAB — HEPATITIS B SURFACE ANTIGEN: Hepatitis B Surface Ag: NONREACTIVE

## 2022-02-23 SURGERY — BYPASS GRAFT FEMORAL-POPLITEAL ARTERY
Anesthesia: General | Site: Toe | Laterality: Right

## 2022-02-23 MED ORDER — HEPARIN SODIUM (PORCINE) 1000 UNIT/ML IJ SOLN
INTRAMUSCULAR | Status: DC | PRN
  Administered 2022-02-23: 1000 [IU] via INTRAVENOUS
  Administered 2022-02-23: 6000 [IU] via INTRAVENOUS

## 2022-02-23 MED ORDER — HEPARIN SODIUM (PORCINE) 5000 UNIT/ML IJ SOLN
5000.0000 [IU] | Freq: Three times a day (TID) | INTRAMUSCULAR | Status: DC
  Administered 2022-02-27 (×2): 5000 [IU] via SUBCUTANEOUS
  Filled 2022-02-23 (×6): qty 1

## 2022-02-23 MED ORDER — ADULT MULTIVITAMIN W/MINERALS CH
1.0000 | ORAL_TABLET | Freq: Every day | ORAL | Status: DC
  Administered 2022-02-23 – 2022-03-01 (×7): 1 via ORAL
  Filled 2022-02-23 (×7): qty 1

## 2022-02-23 MED ORDER — LABETALOL HCL 5 MG/ML IV SOLN: 10.0000 mg | INTRAVENOUS | Status: DC | PRN

## 2022-02-23 MED ORDER — HYDROMORPHONE HCL 1 MG/ML IJ SOLN: 0.5000 mg | INTRAMUSCULAR | Status: DC | PRN

## 2022-02-23 MED ORDER — DEXAMETHASONE SODIUM PHOSPHATE 10 MG/ML IJ SOLN
INTRAMUSCULAR | Status: AC
  Filled 2022-02-23: qty 1

## 2022-02-23 MED ORDER — PHENYLEPHRINE 40 MCG/ML (10ML) SYRINGE FOR IV PUSH (FOR BLOOD PRESSURE SUPPORT)
PREFILLED_SYRINGE | INTRAVENOUS | Status: DC | PRN
  Administered 2022-02-23: 80 ug via INTRAVENOUS

## 2022-02-23 MED ORDER — CALCIUM ACETATE 667 MG PO CAPS: 667.0000 mg | ORAL_CAPSULE | Freq: Two times a day (BID) | ORAL | Status: DC

## 2022-02-23 MED ORDER — PREDNISONE 5 MG PO TABS
5.0000 mg | ORAL_TABLET | Freq: Every day | ORAL | Status: DC
  Administered 2022-02-24 – 2022-03-01 (×6): 5 mg via ORAL
  Filled 2022-02-23 (×6): qty 1

## 2022-02-23 MED ORDER — FERROUS GLUCONATE 324 (38 FE) MG PO TABS
324.0000 mg | ORAL_TABLET | Freq: Every day | ORAL | Status: DC
  Administered 2022-02-25: 324 mg via ORAL
  Filled 2022-02-23 (×2): qty 1

## 2022-02-23 MED ORDER — POLYETHYLENE GLYCOL 3350 17 G PO PACK: 17.0000 g | PACK | Freq: Every day | ORAL | Status: DC | PRN

## 2022-02-23 MED ORDER — CHLORHEXIDINE GLUCONATE 0.12 % MT SOLN
OROMUCOSAL | Status: AC
  Administered 2022-02-23: 15 mL via OROMUCOSAL
  Filled 2022-02-23: qty 15

## 2022-02-23 MED ORDER — ORAL CARE MOUTH RINSE: 15.0000 mL | Freq: Once | OROMUCOSAL | Status: AC

## 2022-02-23 MED ORDER — ONDANSETRON HCL 4 MG/2ML IJ SOLN
INTRAMUSCULAR | Status: DC | PRN
  Administered 2022-02-23: 4 mg via INTRAVENOUS

## 2022-02-23 MED ORDER — GABAPENTIN 300 MG PO CAPS
300.0000 mg | ORAL_CAPSULE | Freq: Every day | ORAL | Status: DC
Start: 2022-02-24 — End: 2022-02-26
  Administered 2022-02-24 – 2022-02-25 (×2): 300 mg via ORAL
  Filled 2022-02-23 (×2): qty 1

## 2022-02-23 MED ORDER — HYDROMORPHONE HCL 1 MG/ML IJ SOLN
0.2500 mg | INTRAMUSCULAR | Status: DC | PRN
  Administered 2022-02-23: 0.5 mg via INTRAVENOUS

## 2022-02-23 MED ORDER — INSULIN ASPART 100 UNIT/ML IJ SOLN: 0.0000 [IU] | INTRAMUSCULAR | Status: DC | PRN

## 2022-02-23 MED ORDER — CARVEDILOL 25 MG PO TABS
25.0000 mg | ORAL_TABLET | ORAL | Status: DC
Start: 2022-02-24 — End: 2022-03-02
  Administered 2022-02-24: 25 mg via ORAL
  Filled 2022-02-23 (×2): qty 1

## 2022-02-23 MED ORDER — VANCOMYCIN HCL IN DEXTROSE 1-5 GM/200ML-% IV SOLN
INTRAVENOUS | Status: AC
  Filled 2022-02-23: qty 200

## 2022-02-23 MED ORDER — LACTATED RINGERS IV SOLN: INTRAVENOUS | Status: DC | PRN

## 2022-02-23 MED ORDER — HYDRALAZINE HCL 20 MG/ML IJ SOLN: 5.0000 mg | INTRAMUSCULAR | Status: DC | PRN

## 2022-02-23 MED ORDER — ACETAMINOPHEN 325 MG PO TABS
325.0000 mg | ORAL_TABLET | ORAL | Status: DC | PRN
  Administered 2022-02-25 – 2022-02-27 (×2): 650 mg via ORAL
  Filled 2022-02-23 (×2): qty 2

## 2022-02-23 MED ORDER — ACYCLOVIR 200 MG PO CAPS
800.0000 mg | ORAL_CAPSULE | Freq: Every day | ORAL | Status: DC
  Administered 2022-02-23: 800 mg via ORAL
  Filled 2022-02-23 (×2): qty 4

## 2022-02-23 MED ORDER — ROCURONIUM BROMIDE 10 MG/ML (PF) SYRINGE
PREFILLED_SYRINGE | INTRAVENOUS | Status: AC
  Filled 2022-02-23: qty 10

## 2022-02-23 MED ORDER — ROCURONIUM BROMIDE 10 MG/ML (PF) SYRINGE
PREFILLED_SYRINGE | INTRAVENOUS | Status: DC | PRN
  Administered 2022-02-23: 20 mg via INTRAVENOUS
  Administered 2022-02-23: 40 mg via INTRAVENOUS

## 2022-02-23 MED ORDER — EPHEDRINE 5 MG/ML INJ
INTRAVENOUS | Status: AC
  Filled 2022-02-23: qty 5

## 2022-02-23 MED ORDER — TACROLIMUS 0.5 MG PO CAPS
0.5000 mg | ORAL_CAPSULE | Freq: Two times a day (BID) | ORAL | Status: DC
  Administered 2022-02-23 – 2022-03-01 (×8): 0.5 mg via ORAL
  Filled 2022-02-23 (×13): qty 1

## 2022-02-23 MED ORDER — MIDODRINE HCL 5 MG PO TABS
5.0000 mg | ORAL_TABLET | ORAL | Status: DC
  Filled 2022-02-23: qty 1

## 2022-02-23 MED ORDER — HEPARIN 6000 UNIT IRRIGATION SOLUTION
Status: AC
  Filled 2022-02-23: qty 500

## 2022-02-23 MED ORDER — PROPOFOL 10 MG/ML IV BOLUS
INTRAVENOUS | Status: AC
  Filled 2022-02-23: qty 20

## 2022-02-23 MED ORDER — SODIUM CHLORIDE 0.9 % IV SOLN: 500.0000 mL | Freq: Once | INTRAVENOUS | Status: DC | PRN

## 2022-02-23 MED ORDER — SUGAMMADEX SODIUM 200 MG/2ML IV SOLN
INTRAVENOUS | Status: DC | PRN
  Administered 2022-02-23: 104.4 mg via INTRAVENOUS

## 2022-02-23 MED ORDER — AMLODIPINE BESYLATE 5 MG PO TABS
5.0000 mg | ORAL_TABLET | Freq: Every day | ORAL | Status: DC
  Administered 2022-02-24 – 2022-02-28 (×5): 5 mg via ORAL
  Filled 2022-02-23 (×6): qty 1

## 2022-02-23 MED ORDER — EPHEDRINE SULFATE-NACL 50-0.9 MG/10ML-% IV SOSY
PREFILLED_SYRINGE | INTRAVENOUS | Status: DC | PRN
  Administered 2022-02-23: 10 mg via INTRAVENOUS

## 2022-02-23 MED ORDER — CARVEDILOL 25 MG PO TABS: 25.0000 mg | ORAL_TABLET | ORAL | Status: DC

## 2022-02-23 MED ORDER — CHLORHEXIDINE GLUCONATE CLOTH 2 % EX PADS
6.0000 | MEDICATED_PAD | Freq: Every day | CUTANEOUS | Status: DC
  Administered 2022-02-24 – 2022-03-01 (×6): 6 via TOPICAL

## 2022-02-23 MED ORDER — POTASSIUM CHLORIDE CRYS ER 20 MEQ PO TBCR
20.0000 meq | EXTENDED_RELEASE_TABLET | Freq: Every day | ORAL | Status: DC
  Administered 2022-02-24: 20 meq via ORAL
  Filled 2022-02-23 (×2): qty 1

## 2022-02-23 MED ORDER — HYDROMORPHONE HCL 1 MG/ML IJ SOLN
INTRAMUSCULAR | Status: AC
  Filled 2022-02-23: qty 1

## 2022-02-23 MED ORDER — DROPERIDOL 2.5 MG/ML IJ SOLN: 0.6250 mg | Freq: Once | INTRAMUSCULAR | Status: DC | PRN

## 2022-02-23 MED ORDER — MAGNESIUM SULFATE 2 GM/50ML IV SOLN: 2.0000 g | Freq: Every day | INTRAVENOUS | Status: DC | PRN

## 2022-02-23 MED ORDER — HEMOSTATIC AGENTS (NO CHARGE) OPTIME
TOPICAL | Status: DC | PRN
  Administered 2022-02-23 (×2): 1 via TOPICAL

## 2022-02-23 MED ORDER — CHLORHEXIDINE GLUCONATE 0.12 % MT SOLN: 15.0000 mL | Freq: Once | OROMUCOSAL | Status: AC

## 2022-02-23 MED ORDER — FENTANYL CITRATE (PF) 250 MCG/5ML IJ SOLN
INTRAMUSCULAR | Status: AC
  Filled 2022-02-23: qty 5

## 2022-02-23 MED ORDER — BISACODYL 10 MG RE SUPP: 10.0000 mg | Freq: Every day | RECTAL | Status: DC | PRN

## 2022-02-23 MED ORDER — PHENOL 1.4 % MT LIQD: 1.0000 | OROMUCOSAL | Status: DC | PRN

## 2022-02-23 MED ORDER — FERROUS GLUCONATE 225 (27 FE) MG PO TABS: 240.0000 mg | ORAL_TABLET | Freq: Every evening | ORAL | Status: DC

## 2022-02-23 MED ORDER — DEXTROSE 50 % IV SOLN
1.0000 | Freq: Every day | INTRAVENOUS | Status: DC | PRN
  Filled 2022-02-23: qty 50

## 2022-02-23 MED ORDER — 0.9 % SODIUM CHLORIDE (POUR BTL) OPTIME
TOPICAL | Status: DC | PRN
  Administered 2022-02-23 (×2): 1000 mL

## 2022-02-23 MED ORDER — METOPROLOL TARTRATE 5 MG/5ML IV SOLN: 2.0000 mg | INTRAVENOUS | Status: DC | PRN

## 2022-02-23 MED ORDER — SODIUM CHLORIDE 0.9 % IV SOLN: INTRAVENOUS | Status: DC

## 2022-02-23 MED ORDER — PROPOFOL 10 MG/ML IV BOLUS
INTRAVENOUS | Status: DC | PRN
  Administered 2022-02-23: 70 mg via INTRAVENOUS

## 2022-02-23 MED ORDER — ACETAMINOPHEN 650 MG RE SUPP: 325.0000 mg | RECTAL | Status: DC | PRN

## 2022-02-23 MED ORDER — HEPARIN 6000 UNIT IRRIGATION SOLUTION
Status: DC | PRN
  Administered 2022-02-23: 1

## 2022-02-23 MED ORDER — FENTANYL CITRATE (PF) 250 MCG/5ML IJ SOLN
INTRAMUSCULAR | Status: DC | PRN
  Administered 2022-02-23 (×2): 50 ug via INTRAVENOUS

## 2022-02-23 MED ORDER — SULFAMETHOXAZOLE-TRIMETHOPRIM 400-80 MG PO TABS
1.0000 | ORAL_TABLET | Freq: Every evening | ORAL | Status: DC
  Administered 2022-02-24 – 2022-02-28 (×4): 1 via ORAL
  Filled 2022-02-23 (×6): qty 1

## 2022-02-23 MED ORDER — ALUM & MAG HYDROXIDE-SIMETH 200-200-20 MG/5ML PO SUSP: 15.0000 mL | ORAL | Status: DC | PRN

## 2022-02-23 MED ORDER — PROTAMINE SULFATE 10 MG/ML IV SOLN
INTRAVENOUS | Status: AC
  Filled 2022-02-23: qty 5

## 2022-02-23 MED ORDER — CALCIUM ACETATE (PHOS BINDER) 667 MG PO CAPS
667.0000 mg | ORAL_CAPSULE | Freq: Two times a day (BID) | ORAL | Status: DC
  Administered 2022-02-23 – 2022-02-28 (×7): 667 mg via ORAL
  Filled 2022-02-23 (×9): qty 1

## 2022-02-23 MED ORDER — ASPIRIN EC 81 MG PO TBEC
81.0000 mg | DELAYED_RELEASE_TABLET | Freq: Every day | ORAL | Status: DC
  Administered 2022-02-24 – 2022-03-01 (×6): 81 mg via ORAL
  Filled 2022-02-23 (×6): qty 1

## 2022-02-23 MED ORDER — PROTAMINE SULFATE 10 MG/ML IV SOLN
INTRAVENOUS | Status: DC | PRN
  Administered 2022-02-23: 50 mg via INTRAVENOUS

## 2022-02-23 MED ORDER — DOCUSATE SODIUM 100 MG PO CAPS
100.0000 mg | ORAL_CAPSULE | Freq: Every day | ORAL | Status: DC
  Administered 2022-02-25 – 2022-02-28 (×4): 100 mg via ORAL
  Filled 2022-02-23 (×6): qty 1

## 2022-02-23 MED ORDER — FAMOTIDINE 20 MG PO TABS
20.0000 mg | ORAL_TABLET | Freq: Every morning | ORAL | Status: DC
Start: 2022-02-24 — End: 2022-02-28
  Administered 2022-02-24 – 2022-02-28 (×4): 20 mg via ORAL
  Filled 2022-02-23 (×4): qty 1

## 2022-02-23 MED ORDER — OXYCODONE-ACETAMINOPHEN 5-325 MG PO TABS
1.0000 | ORAL_TABLET | ORAL | Status: DC | PRN
  Administered 2022-02-23: 1 via ORAL
  Administered 2022-02-23 – 2022-02-27 (×7): 2 via ORAL
  Filled 2022-02-23 (×8): qty 2

## 2022-02-23 MED ORDER — OXYCODONE-ACETAMINOPHEN 5-325 MG PO TABS
ORAL_TABLET | ORAL | Status: AC
  Filled 2022-02-23: qty 2

## 2022-02-23 MED ORDER — PRAVASTATIN SODIUM 10 MG PO TABS
20.0000 mg | ORAL_TABLET | Freq: Every evening | ORAL | Status: DC
  Administered 2022-02-23 – 2022-02-28 (×5): 20 mg via ORAL
  Filled 2022-02-23 (×5): qty 2

## 2022-02-23 MED ORDER — PANTOPRAZOLE SODIUM 40 MG PO TBEC
40.0000 mg | DELAYED_RELEASE_TABLET | Freq: Every day | ORAL | Status: DC
  Administered 2022-02-23 – 2022-03-01 (×7): 40 mg via ORAL
  Filled 2022-02-23 (×7): qty 1

## 2022-02-23 MED ORDER — ONDANSETRON HCL 4 MG/2ML IJ SOLN: 4.0000 mg | Freq: Four times a day (QID) | INTRAMUSCULAR | Status: DC | PRN

## 2022-02-23 MED ORDER — CARVEDILOL 25 MG PO TABS
25.0000 mg | ORAL_TABLET | ORAL | Status: DC
  Administered 2022-02-23 – 2022-02-28 (×5): 25 mg via ORAL
  Filled 2022-02-23 (×5): qty 1

## 2022-02-23 MED ORDER — GUAIFENESIN-DM 100-10 MG/5ML PO SYRP: 15.0000 mL | ORAL_SOLUTION | ORAL | Status: DC | PRN

## 2022-02-23 MED ORDER — LIDOCAINE 2% (20 MG/ML) 5 ML SYRINGE
INTRAMUSCULAR | Status: AC
  Filled 2022-02-23: qty 5

## 2022-02-23 MED ORDER — LANTHANUM CARBONATE 500 MG PO CHEW
1500.0000 mg | CHEWABLE_TABLET | Freq: Two times a day (BID) | ORAL | Status: DC
  Administered 2022-02-23 – 2022-02-28 (×7): 1500 mg via ORAL
  Filled 2022-02-23 (×9): qty 3

## 2022-02-23 MED ORDER — INSULIN ASPART 100 UNIT/ML IJ SOLN
0.0000 [IU] | Freq: Three times a day (TID) | INTRAMUSCULAR | Status: DC
  Administered 2022-02-23: 2 [IU] via SUBCUTANEOUS
  Administered 2022-02-24: 3 [IU] via SUBCUTANEOUS
  Administered 2022-02-24: 2 [IU] via SUBCUTANEOUS

## 2022-02-23 MED ORDER — VANCOMYCIN HCL IN DEXTROSE 1-5 GM/200ML-% IV SOLN
1000.0000 mg | Freq: Once | INTRAVENOUS | Status: AC
  Administered 2022-02-23: 1000 mg via INTRAVENOUS

## 2022-02-23 MED ORDER — ONDANSETRON HCL 4 MG/2ML IJ SOLN
INTRAMUSCULAR | Status: AC
  Filled 2022-02-23: qty 2

## 2022-02-23 MED ORDER — LIDOCAINE 2% (20 MG/ML) 5 ML SYRINGE
INTRAMUSCULAR | Status: DC | PRN
  Administered 2022-02-23: 60 mg via INTRAVENOUS

## 2022-02-23 MED ORDER — AZITHROMYCIN 250 MG PO TABS
250.0000 mg | ORAL_TABLET | ORAL | Status: DC
  Administered 2022-02-23 – 2022-02-28 (×3): 250 mg via ORAL
  Filled 2022-02-23 (×3): qty 1

## 2022-02-23 MED ORDER — PHENYLEPHRINE HCL-NACL 20-0.9 MG/250ML-% IV SOLN
INTRAVENOUS | Status: DC | PRN
  Administered 2022-02-23: 40 ug/min via INTRAVENOUS

## 2022-02-23 MED ORDER — DEXAMETHASONE SODIUM PHOSPHATE 10 MG/ML IJ SOLN
INTRAMUSCULAR | Status: DC | PRN
  Administered 2022-02-23: 8 mg via INTRAVENOUS

## 2022-02-23 SURGICAL SUPPLY — 78 items
ADH SKN CLS APL DERMABOND .7 (GAUZE/BANDAGES/DRESSINGS) ×6
AGENT HMST KT MTR STRL THRMB (HEMOSTASIS) ×6
BAG COUNTER SPONGE SURGICOUNT (BAG) ×4 IMPLANT
BAG SPNG CNTER NS LX DISP (BAG) ×3
BANDAGE ESMARK 6X9 LF (GAUZE/BANDAGES/DRESSINGS) IMPLANT
BLADE SURG 21 STRL SS (BLADE) ×1 IMPLANT
BNDG CMPR 9X6 STRL LF SNTH (GAUZE/BANDAGES/DRESSINGS) ×3
BNDG COHESIVE 4X5 TAN ST LF (GAUZE/BANDAGES/DRESSINGS) ×1 IMPLANT
BNDG ELASTIC 4X5.8 VLCR STR LF (GAUZE/BANDAGES/DRESSINGS) IMPLANT
BNDG ESMARK 6X9 LF (GAUZE/BANDAGES/DRESSINGS) ×4
BNDG GAUZE ELAST 4 BULKY (GAUZE/BANDAGES/DRESSINGS) IMPLANT
CANISTER SUCT 3000ML PPV (MISCELLANEOUS) ×4 IMPLANT
CANNULA VESSEL 3MM 2 BLNT TIP (CANNULA) ×4 IMPLANT
CLIP VESOCCLUDE MED 24/CT (CLIP) ×4 IMPLANT
CLIP VESOCCLUDE SM WIDE 24/CT (CLIP) ×4 IMPLANT
CUFF TOURN SGL QUICK 24 (TOURNIQUET CUFF) ×4
CUFF TOURN SGL QUICK 34 (TOURNIQUET CUFF)
CUFF TOURN SGL QUICK 42 (TOURNIQUET CUFF) IMPLANT
CUFF TRNQT CYL 24X4X16.5-23 (TOURNIQUET CUFF) IMPLANT
CUFF TRNQT CYL 34X4.125X (TOURNIQUET CUFF) IMPLANT
DERMABOND ADVANCED (GAUZE/BANDAGES/DRESSINGS) ×2
DERMABOND ADVANCED .7 DNX12 (GAUZE/BANDAGES/DRESSINGS) ×3 IMPLANT
DRAIN CHANNEL 15F RND FF W/TCR (WOUND CARE) IMPLANT
DRAPE DERMATAC (DRAPES) ×1 IMPLANT
DRAPE HALF SHEET 40X57 (DRAPES) IMPLANT
DRAPE X-RAY CASS 24X20 (DRAPES) IMPLANT
DRESSING PEEL AND PLC PRVNA 13 (GAUZE/BANDAGES/DRESSINGS) IMPLANT
DRSG PEEL AND PLACE PREVENA 13 (GAUZE/BANDAGES/DRESSINGS) ×4
ELECT REM PT RETURN 9FT ADLT (ELECTROSURGICAL) ×8
ELECTRODE REM PT RTRN 9FT ADLT (ELECTROSURGICAL) ×3 IMPLANT
EVACUATOR SILICONE 100CC (DRAIN) IMPLANT
GAUZE SPONGE 4X4 12PLY STRL LF (GAUZE/BANDAGES/DRESSINGS) IMPLANT
GAUZE XEROFORM 1X8 LF (GAUZE/BANDAGES/DRESSINGS) IMPLANT
GLOVE SRG 8 PF TXTR STRL LF DI (GLOVE) ×3 IMPLANT
GLOVE SURG LTX SZ8.5 (GLOVE) ×1 IMPLANT
GLOVE SURG POLYISO LF SZ7.5 (GLOVE) ×4 IMPLANT
GLOVE SURG UNDER POLY LF SZ8 (GLOVE) ×4
GOWN STRL REUS W/ TWL LRG LVL3 (GOWN DISPOSABLE) ×6 IMPLANT
GOWN STRL REUS W/ TWL XL LVL3 (GOWN DISPOSABLE) ×3 IMPLANT
GOWN STRL REUS W/TWL LRG LVL3 (GOWN DISPOSABLE) ×12
GOWN STRL REUS W/TWL XL LVL3 (GOWN DISPOSABLE) ×8
GRAFT PROPATEN W/RING 6X80X60 (Vascular Products) ×1 IMPLANT
GRAFT SKIN WND MARIGEN OMEGA3 (Tissue) IMPLANT
GRAFT SKIN WND OMEGA3 19 (Tissue) ×4 IMPLANT
HEMOSTAT SNOW SURGICEL 2X4 (HEMOSTASIS) IMPLANT
INSERT FOGARTY SM (MISCELLANEOUS) IMPLANT
KIT BASIN OR (CUSTOM PROCEDURE TRAY) ×4 IMPLANT
KIT DRSG PREVENA PLUS 7DAY 125 (MISCELLANEOUS) ×1 IMPLANT
KIT TURNOVER KIT B (KITS) ×4 IMPLANT
MARKER GRAFT CORONARY BYPASS (MISCELLANEOUS) IMPLANT
NS IRRIG 1000ML POUR BTL (IV SOLUTION) ×8 IMPLANT
PACK PERIPHERAL VASCULAR (CUSTOM PROCEDURE TRAY) ×4 IMPLANT
PAD ARMBOARD 7.5X6 YLW CONV (MISCELLANEOUS) ×8 IMPLANT
SET COLLECT BLD 21X3/4 12 (NEEDLE) IMPLANT
STOPCOCK 4 WAY LG BORE MALE ST (IV SETS) IMPLANT
SURGIFLO W/THROMBIN 8M KIT (HEMOSTASIS) ×2 IMPLANT
SUT ETHILON 2 0 PSLX (SUTURE) ×3 IMPLANT
SUT ETHILON 3 0 PS 1 (SUTURE) IMPLANT
SUT GORETEX 6.0 TT13 (SUTURE) IMPLANT
SUT GORETEX 6.0 TT9 (SUTURE) IMPLANT
SUT PROLENE 4 0 RB 1 (SUTURE) ×4
SUT PROLENE 4-0 RB1 .5 CRCL 36 (SUTURE) IMPLANT
SUT PROLENE 5 0 C 1 24 (SUTURE) ×5 IMPLANT
SUT PROLENE 6 0 BV (SUTURE) ×13 IMPLANT
SUT PROLENE 7 0 BV 1 (SUTURE) IMPLANT
SUT SILK 2 0 SH (SUTURE) ×4 IMPLANT
SUT SILK 3 0 (SUTURE)
SUT SILK 3-0 18XBRD TIE 12 (SUTURE) IMPLANT
SUT VIC AB 2-0 CT1 27 (SUTURE) ×8
SUT VIC AB 2-0 CT1 TAPERPNT 27 (SUTURE) ×6 IMPLANT
SUT VIC AB 3-0 SH 27 (SUTURE) ×8
SUT VIC AB 3-0 SH 27X BRD (SUTURE) ×6 IMPLANT
SUT VICRYL 4-0 PS2 18IN ABS (SUTURE) ×8 IMPLANT
TOWEL GREEN STERILE (TOWEL DISPOSABLE) ×4 IMPLANT
TRAY FOLEY MTR SLVR 16FR STAT (SET/KITS/TRAYS/PACK) ×4 IMPLANT
TUBING EXTENTION W/L.L. (IV SETS) IMPLANT
UNDERPAD 30X36 HEAVY ABSORB (UNDERPADS AND DIAPERS) ×4 IMPLANT
WATER STERILE IRR 1000ML POUR (IV SOLUTION) ×4 IMPLANT

## 2022-02-23 NOTE — Assessment & Plan Note (Addendum)
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 ?Home medications- Prograf, prednisone, Bactrim ? ?

## 2022-02-23 NOTE — Op Note (Signed)
02/23/2022 ? ?4:00 PM ? ?PATIENT:  Tyler Mueller   ? ?PRE-OPERATIVE DIAGNOSIS:  Right toe ulcer ? ?POST-OPERATIVE DIAGNOSIS:  Same ? ?PROCEDURE: THIRD ray amputation right foot, APPLICATION OF WOUND VAC, APPLICATION OF SKIN SUBSTITUTE KERECIS MariGen Micro 19cm ? ?SURGEON:  Newt Minion, MD ? ?PHYSICIAN ASSISTANT:None ?ANESTHESIA:   General ? ?PREOPERATIVE INDICATIONS:  Tyler Mueller is a  81 y.o. male with a diagnosis of Right toe ulcer who failed conservative measures and elected for surgical management.   ? ?The risks benefits and alternatives were discussed with the patient preoperatively including but not limited to the risks of infection, bleeding, nerve injury, cardiopulmonary complications, the need for revision surgery, among others, and the patient was willing to proceed. ? ?OPERATIVE IMPLANTS: Kerecis 19 cm? micro tissue graft ? ?'@ENCIMAGES'$ @ ? ?OPERATIVE FINDINGS: Patient had minimal petechial bleeding.  He did have triphasic flow in the anterior tibial artery.  Patient's bone was soft and nonviable necessitating resection of the ray. ? ?OPERATIVE PROCEDURE: Patient was seen in consultation with vascular surgery.  After completion of their revascularization procedure I proceeded with the third ray amputation.  The necrotic third toe was amputated however there was necrotic bone changes of the metatarsal head the third ray was amputated more proximally.  There is minimal petechial bleeding.  The wound was irrigated with normal saline the skin was extremely thin.  After irrigation and wound cleansing 18 cm? of Kerecis graft was applied to the wound.  Skin was closed using 2-0 nylon a 13 cm Prevena wound VAC was applied this had a good suction fit patient was taken the PACU in stable condition. ? ? ?DISCHARGE PLANNING: ? ?Antibiotic duration: Continue antibiotics per vascular surgery ? ?Weightbearing: Nonweightbearing on the right ? ?Pain medication: Opioid pathway ? ?Dressing care/ Wound VAC: Continue  wound VAC for 1 week ? ?Ambulatory devices: Walker ? ?Discharge to: Discharge planning based on therapy recommendations ? ?Follow-up: In the office 1 week post operative. ? ? ? ? ? ? ?  ?

## 2022-02-23 NOTE — Consult Note (Signed)
Renal Service ?Consult Note ?[facility] ? ?Jj E Eves ?02/23/2022 ?Sol Blazing, MD ?Requesting Physician: Dr Trula Slade ? ?Reason for Consult: ESRD pt w/ foot ulcer ?HPI: The patient is a 81 y.o. 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 He was admitted and underwent aortogram and LE arteriogram on 3/21, and today underwent 3rd ray amputation w/ wound vac placement. We are asked to see for dialysis.  ? ?Pt has been on HD about 5-6 yrs, no recent issues w/ HD or w/ access.  He has a nice L forearm AVF.  ? ? ?ROS - denies CP, no joint pain, no HA, no blurry vision, no rash, no diarrhea, no nausea/ vomiting, no dysuria, no difficulty voiding ? ? ?Past Medical History  ?Past Medical History:  ?Diagnosis Date  ? Chronic kidney disease   ? RENAL INSUFFICIENCY  ? Diabetes mellitus without complication ([place])   ? Diverticulitis   ? History of lung transplant ([place]) 04/21/2014  ? Hypertension   ? Lung transplanted ([place]) 12/05/2002  ? BILATERAL   ? Muscle spasm of back 04/21/2014  ? Skin cancer of face   ? ?Past Surgical History  ?Past Surgical History:  ?Procedure Laterality Date  ? ABDOMINAL AORTOGRAM W/LOWER EXTREMITY Right 02/22/2022  ? Procedure: ABDOMINAL AORTOGRAM W/LOWER EXTREMITY;  Surgeon: Serafina Mitchell, MD;  Location: [facility];  Service: Cardiovascular;  Laterality: Right;  ? APPENDECTOMY    ? CATARACT EXTRACTION    ? LUNG TRANSPLANT, DOUBLE  2004  ? PERIPHERAL VASCULAR BALLOON ANGIOPLASTY Right 02/22/2022  ? Procedure: PERIPHERAL VASCULAR BALLOON ANGIOPLASTY;  Surgeon: Serafina Mitchell, MD;  Location: [facility];  Service: Cardiovascular;  Laterality: Right;  SFA unable to cross lesion  ? ?Family History History reviewed. No pertinent family history. ?Social History  reports that he quit smoking about 18 years ago. His smoking use included cigarettes. He has never used smokeless tobacco. He reports that he  does not drink alcohol and does not use drugs. ?Allergies  ?Allergies  ?Allergen Reactions  ? Lorazepam Other (See Comments)  ?  delirium ?  ? Morphine And Related Nausea And Vomiting and Other (See Comments)  ?  Hallucinations   ? Nsaids Other (See Comments)  ?   Kidney disorder  ? Penicillins Nausea And Vomiting and Other (See Comments)  ?  Hallucinations   ? Amphetamine-Dextroamphetamine Anxiety and Other (See Comments)  ?  confusion ?  ? ?Home medications ?Prior to Admission medications   ?Medication Sig Start Date End Date Taking? Authorizing Provider  ?acetaminophen (TYLENOL) 500 MG tablet Take 500 mg by mouth every 6 (six) hours as needed for moderate pain.   Yes [provider]  ?Alpha-D-Galactosidase (BEANO PO) Take 1 tablet by mouth daily as needed (gas/bloating).   Yes [provider]  ?amLODipine (NORVASC) 5 MG tablet Take 5 mg by mouth daily. Hold for systolic bp less than 191.   Yes [provider]  ?aspirin EC 81 MG tablet Take 81 mg by mouth daily.   Yes [provider]  ?azithromycin (ZITHROMAX) 250 MG tablet MAY ADMINISTER WITHOUT REGARD TO MEALS ?Patient taking differently: Take 250 mg by mouth every Monday, Wednesday, and Friday. contineous 12/01/21  Yes Timothy Lasso, MD  ?beta carotene w/minerals (OCUVITE) tablet Take 1 tablet by mouth daily.   Yes [provider]  ?calcium acetate (PHOSLO) 667 MG capsule Take 2,001 mg  by mouth 2 (two) times daily with a meal.   Yes [provider]  ?Calcium-Magnesium-Zinc (CAL-MAG-ZINC PO) Take 1 tablet by mouth in the morning.   Yes [provider]  ?carvedilol (COREG) 25 MG tablet Take 25 mg by mouth See admin instructions. Take 1 tablet (25 mg) by mouth in the evening on Tuesdays, Thursdays & Saturdays and Sundays.   Yes [provider]  ?famotidine (PEPCID) 20 MG tablet Take 20 mg by mouth in the morning.   Yes [provider]  ?ferrous gluconate (FERGON) 225 (27 FE) MG  tablet Take 240 mg by mouth every evening.   Yes [provider]  ?fluticasone-salmeterol (ADVAIR DISKUS) 250-50 MCG/ACT AEPB Inhale 1 puff into the lungs in the morning and at bedtime.   Yes [provider]  ?gabapentin (NEURONTIN) 300 MG capsule Take 300 mg by mouth at bedtime.   Yes [provider]  ?lanthanum (FOSRENOL) 500 MG chewable tablet Chew 1,500 mg by mouth daily.   Yes [provider]  ?midodrine (PROAMATINE) 5 MG tablet Take 5 mg by mouth See admin instructions. Takes 1 tablet by mouth only on dialysis days if systolic blood pressure less than 150.   Yes [provider]  ?Multiple Vitamin (MULTIVITAMIN WITH MINERALS) TABS tablet Take 1 tablet by mouth daily.   Yes [provider]  ?potassium chloride SA (K-DUR,KLOR-CON) 20 MEQ tablet Take 1 tablet (20 mEq total) by mouth daily. 01/13/46  Yes Delora Fuel, MD  ?pravastatin (PRAVACHOL) 40 MG tablet Take 20 mg by mouth every evening.   Yes [provider]  ?predniSONE (DELTASONE) 5 MG tablet Take 5 mg by mouth daily.   Yes [provider]  ?tacrolimus (PROGRAF) 0.5 MG capsule Take 0.5 mg by mouth in the morning and at bedtime.   Yes [provider]  ?UNKNOWN TO PATIENT Inject 4-6 Units into the skin daily as needed (If blood sugar over 200). Sliding scale   Yes [provider]  ?acyclovir (ZOVIRAX) 200 MG capsule Take 4 capsules (800 mg total) by mouth daily. ?Patient not taking: Reported on 02/23/2022 04/27/14   Jerrye Noble, MD  ?sulfamethoxazole-trimethoprim (BACTRIM,SEPTRA) 400-80 MG per tablet Take 1 tablet by mouth every evening. ?Patient not taking: Reported on 02/23/2022    [provider]  ? ? ? ?Vitals:  ? 02/23/22 1315 02/23/22 1322 02/23/22 1337 02/23/22 1400  ?BP:  (!) 117/44 (!) 104/43 (!) 110/45  ?Pulse: 74 72 72 73  ?Resp: '17 16 15 16  '$ ?Temp:    97.7 ?F (36.5 ?C)  ?TempSrc:    Oral  ?SpO2: 96% 97% 98% 100%  ?Weight:      ?Height:       ? ?Exam ?Gen alert, no distress ?No rash, cyanosis or gangrene ?Sclera anicteric, throat clear  ?No jvd or bruits ?Chest clear bilat to bases, no rales/ wheezing ?RRR no MRG ?Abd soft ntnd no mass or ascites +bs ?GU normal ?MS no joint effusions or deformity ?Ext no LE or UE edema, no wounds or ulcers ?Neuro is alert, Ox 3 , nf ?   LFA AVF+bruit ? ? ? ? ? ? OP HD: [facility] ? 3.5h  350/600   2/2 bath  LFA AVF  Hep none ? - needs updating ? ? ?Assessment/ Plan: ?R foot ulcer - sp arteriogram on 3/21 and sp R 3rd ray amputation today ?ESRD - on HD TTS.  Plan HD tomorrow.  ?BP/ volume - no vol excess on exam.  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 Get records.  ?Hx lung transplant - on prograf and prednisone ?  ? ? ? ?Kelly Splinter  MD ?02/23/2022, 5:00 PM ?Recent Labs  ?Lab 02/22/22 ?0718 02/23/22 ?0622  ?HGB 11.9* 11.2*  ?CREATININE 4.40* 5.90*  ?K 3.9 4.5  ? ? ?

## 2022-02-23 NOTE — Assessment & Plan Note (Addendum)
Sliding scale and Accu-Chek ?Due to some hypoglycemia will discontinue Premeal insulin ?A1c 02/23/2022-5.8 ? ? ?

## 2022-02-23 NOTE — Anesthesia Procedure Notes (Signed)
Arterial Line Insertion ?Start/End3/22/2023 7:15 AM, 02/23/2022 7:20 AM ?Performed by: Nolon Nations, MD, Minerva Ends, CRNA, CRNA ? Patient location: Pre-op. ?Preanesthetic checklist: patient identified, IV checked, site marked, risks and benefits discussed, surgical consent, monitors and equipment checked, pre-op evaluation, timeout performed and anesthesia consent ?Lidocaine 1% used for infiltration ?radial was placed ?Catheter size: 20 G ?Hand hygiene performed  and maximum sterile barriers used  ? ?Attempts: 1 ?Procedure performed using ultrasound guided technique. ?Ultrasound Notes:anatomy identified, needle tip was noted to be adjacent to the nerve/plexus identified and no ultrasound evidence of intravascular and/or intraneural injection ?Following insertion, dressing applied and Biopatch. ?Post procedure assessment: normal and unchanged ? ?Patient tolerated the procedure well with no immediate complications. ? ? ?

## 2022-02-23 NOTE — Assessment & Plan Note (Addendum)
Status post bypass and endarterectomy by vascular 3/22 ?

## 2022-02-23 NOTE — Transfer of Care (Signed)
Immediate Anesthesia Transfer of Care Note ? ?Patient: DAARON DIMARCO ? ?Procedure(s) Performed: RIGHT FEMORAL-POPLITEAL ARTERY BYPASS (Right: Groin) ?RIGHT FEMORAL ENDARTERECTOMY (Right: Groin) ?AMPUTATION THIRD RIGHT TOE (Right: Toe) ?APPLICATION OF WOUND VAC (Right: Foot) ?APPLICATION OF SKIN SUBSTITUTE KERECIS MariGen Micro 19cm (Right: Foot) ? ?Patient Location: PACU ? ?Anesthesia Type:General ? ?Level of Consciousness: awake, alert  and oriented ? ?Airway & Oxygen Therapy: Patient Spontanous Breathing ? ?Post-op Assessment: Report given to RN and Post -op Vital signs reviewed and stable ? ?Post vital signs: Reviewed and stable ? ?Last Vitals:  ?Vitals Value Taken Time  ?BP 123/49 02/23/22 1137  ?Temp 36.5 ?C 02/23/22 1137  ?Pulse 77 02/23/22 1151  ?Resp 17 02/23/22 1151  ?SpO2 98 % 02/23/22 1151  ?Vitals shown include unvalidated device data. ? ?Last Pain:  ?Vitals:  ? 02/23/22 1137  ?TempSrc:   ?PainSc: 0-No pain  ?   ? ?  ? ?Complications: No notable events documented. ?

## 2022-02-23 NOTE — Progress Notes (Signed)
Patient brought to 4E from PACU. Telemetry box applied, CCMD notified. VSS. Patient oriented to room and staff. Call bell in reach. ? ?Daymon Larsen, RN  ?

## 2022-02-23 NOTE — Assessment & Plan Note (Addendum)
Hemodialysis TTS, nephrology team following ?HD per nephrology ?

## 2022-02-23 NOTE — Progress Notes (Signed)
?  Day of Surgery Note ? ? ? ?Subjective:  c/o pain in his groin.  Says his foot feels ok ? ? ?Vitals:  ? 02/23/22 1307 02/23/22 1315  ?BP: (!) 108/44   ?Pulse: 73 74  ?Resp: 16 17  ?Temp:    ?SpO2: 98% 96%  ? ? ?Incisions:   right groin is clean without hematoma ?Extremities:  brisk right AT doppler signal.  Right foot with prevena vac in place ?Cardiac:  regular ?Lungs:  non labored ? ? ? ?Assessment/Plan:  This is a 81 y.o. male who is s/p  ?Right femoral to below knee popliteal bypass grafting with PTFE ? ?-pt with brisk right AT doppler signal ?-c/o right groin pain-incision looks fine and no hematoma present ?-Prevena vac in place right foot and management per Dr. Sharol Given ?-pt to [place] this afternoon ? ? ?Leontine Locket, PA-C ?02/23/2022 ?1:23 PM ?[phone] ? ?

## 2022-02-23 NOTE — Anesthesia Procedure Notes (Signed)
Procedure Name: Intubation ?Date/Time: 02/23/2022 8:10 AM ?Performed by: Minerva Ends, CRNA ?Pre-anesthesia Checklist: Patient identified, Emergency Drugs available, Suction available and Patient being monitored ?Patient Re-evaluated:Patient Re-evaluated prior to induction ?Oxygen Delivery Method: Circle system utilized ?Preoxygenation: Pre-oxygenation with 100% oxygen ?Induction Type: IV induction ?Ventilation: Mask ventilation with difficulty and Oral airway inserted - appropriate to patient size ?Laryngoscope Size: Mac and 3 ?Grade View: Grade I ?Tube type: Oral ?Number of attempts: 1 ?Airway Equipment and Method: Stylet and Oral airway ?Placement Confirmation: ETT inserted through vocal cords under direct vision, positive ETCO2 and breath sounds checked- equal and bilateral ?Secured at: 20 cm ?Tube secured with: Tape ?Dental Injury: Teeth and Oropharynx as per pre-operative assessment  ? ? ? ? ?

## 2022-02-23 NOTE — OR Nursing (Signed)
Attempted urinary catheter was placed intra-op. The catheter went in with ease, but no urine return. It was then removed and MD notified. Surgery proceeded as planned.  ?

## 2022-02-23 NOTE — Discharge Instructions (Signed)
 [facility]  Discharge instructions  Lower Extremity Bypass Surgery  Please refer to the following instruction for your post-procedure care. Your surgeon or physician assistant will discuss any changes with you.  Activity  You are encouraged to walk as much as you can. You can slowly return to normal activities during the month after your surgery. Avoid strenuous activity and heavy lifting until your doctor tells you it's OK. Avoid activities such as vacuuming or swinging a golf club. Do not drive until your doctor give the OK and you are no longer taking prescription pain medications. It is also normal to have difficulty with sleep habits, eating and bowel movement after surgery. These will go away with time.  Bathing/Showering  Shower daily after you go home. Do not soak in a bathtub, hot tub, or swim until the incision heals completely.  Incision Care  Clean your incision with mild soap and water. Shower every day. Pat the area dry with a clean towel. You do not need a bandage unless otherwise instructed. Do not apply any ointments or creams to your incision. If you have open wounds you will be instructed how to care for them or a visiting nurse may be arranged for you. If you have staples or sutures along your incision they will be removed at your post-op appointment. You may have skin glue on your incision. Do not peel it off. It will come off on its own in about one week.  Wash the groin wound with soap and water daily and pat dry. (No tub bath-only shower)  Then put a dry gauze or washcloth in the groin to keep this area dry to help prevent wound infection.  Do this daily and as needed.  Do not use Vaseline or neosporin on your incisions.  Only use soap and water on your incisions and then protect and keep dry.  Diet  Resume your normal diet. There are no special food restrictions following this procedure. A low fat/ low cholesterol diet is  recommended for all patients with vascular disease. In order to heal from your surgery, it is CRITICAL to get adequate nutrition. Your body requires vitamins, minerals, and protein. Vegetables are the best source of vitamins and minerals. Vegetables also provide the perfect balance of protein. Processed food has little nutritional value, so try to avoid this.  Medications  Resume taking all your medications unless your doctor or physician assistant tells you not to. If your incision is causing pain, you may take over-the-counter pain relievers such as acetaminophen (Tylenol). If you were prescribed a stronger pain medication, please aware these medication can cause nausea and constipation. Prevent nausea by taking the medication with a snack or meal. Avoid constipation by drinking plenty of fluids and eating foods with high amount of fiber, such as fruits, vegetables, and grains. Take Colace 100 mg (an over-the-counter stool softener) twice a day as needed for constipation.  Do not take Tylenol if you are taking prescription pain medications.  Follow Up  Our office will schedule a follow up appointment 2-3 weeks following discharge.  Please call us immediately for any of the following conditions  Severe or worsening pain in your legs or feet while at rest or while walking Increase pain, redness, warmth, or drainage (pus) from your incision site(s) Fever of 101 degree or higher The swelling in your leg with the bypass suddenly worsens and becomes more painful than when you were in the hospital If you have   been instructed to feel your graft pulse then you should do so every day. If you can no longer feel this pulse, call the office immediately. Not all patients are given this instruction.  Leg swelling is common after leg bypass surgery.  The swelling should improve over a few months following surgery. To improve the swelling, you may elevate your legs above the level of your heart while you are  sitting or resting. Your surgeon or physician assistant may ask you to apply an ACE wrap or wear compression (TED) stockings to help to reduce swelling.  Reduce your risk of vascular disease  Stop smoking. If you would like help call [facility] at 1-800-QUIT-NOW (1-800-784-8669) or [place] at 336-586-4000.  Manage your cholesterol Maintain a desired weight Control your diabetes weight Control your diabetes Keep your blood pressure down  If you have any questions, please call the office at [phone]  

## 2022-02-23 NOTE — Interval H&P Note (Signed)
History and Physical Interval Note: ? ?02/23/2022 ?7:27 AM ? ?Tyler Mueller  has presented today for surgery, with the diagnosis of Right toe ulcer.  The various methods of treatment have been discussed with the patient and family. After consideration of risks, benefits and other options for treatment, the patient has consented to  Procedure(s): ?RIGHT FEMORAL-POPLITEAL ARTERY BYPASS (Right) as a surgical intervention.  The patient's history has been reviewed, patient examined, no change in status, stable for surgery.  I have reviewed the patient's chart and labs.  Questions were answered to the patient's satisfaction.   ? ? ?[name] ? ? ?Unsuccessful attempt at percutaneous revascularization yesterday.  I discussed proceeding with right femoral popliteal bypass.  Vein mapping shows a small vein.  I will aslo perform third toe amputation.  All questions answered. ? ?WB ?

## 2022-02-23 NOTE — Op Note (Signed)
? ? ?Patient name: Tyler Mueller MRN: [id number] DOB: Oct 04, 1941 Sex: male ? ?02/23/2022 ?Pre-operative Diagnosis: Right third toe gangrene ?Post-operative diagnosis:  Same ?Surgeon:  Annamarie Major ?Assistants:  Leontine Locket ?Procedure:   #1: Right femoral to below-knee popliteal artery bypass graft with 6 mm external ring propatent PTFE ?  #2: Right common femoral and profundofemoral endarterectomy ?Anesthesia:  General ?Blood Loss:  150 cc ?Specimens:  none ? ?Findings: Brisk anterior tibial Doppler signal at the completion of the case.  The patient had a heavily calcified common femoral plaque extending into the profundofemoral artery which was endarterectomized.  The proximal portion of the PTFE graft was spatulated but the size of the arteriotomy, approximately 2.5 cm in length.  The patient had a heavily calcified below-knee popliteal artery as the distal target.  Dr. Sharol Given performed the toe amputation ? ?Indications: This is a 81 year old gentleman with history of lung transplant and renal failure who presented with third toe gangrene.  He underwent an unsuccessful attempt at percutaneous revascularization yesterday and comes in today for bypass.  Preoperative vein mapping showed a suboptimal saphenous vein I discussed this is a limb threatening situation.  Dr. Sharol Given will be available for the toe amputation. ? ?Procedure:  The patient was identified in the holding area and taken to [facility]  The patient was then placed supine on the table. general anesthesia was administered.  The patient was prepped and draped in the usual sterile fashion.  A time out was called and antibiotics were administered.  A PA was necessary to expedite the procedure and assist with technical details. ? ?I began by making a oblique incision in the right groin.  Cautery was used about subcutaneous tissue down to the femoral sheath which was opened sharply.  I exposed the common femoral artery from the inguinal ligament down to  the bifurcation.  The profunda and superficial femoral artery were individually isolated.  There was an old Perclose on the anterior surface of the femoral artery.  There is circumferentially heavily calcified plaque throughout all vessels..  Next, a medial below the knee incision was made.  Cautery was used divide subcutaneous tissue down to the fascia which was opened with cautery.  I then entered the popliteal space.  There is a fair amount of old hematoma from attempts at percutaneous revascularization yesterday.  I fully exposed the below-knee popliteal artery.  It had some soft areas but was heavily calcified.  I then created a subsartorial tunnel with a long Gore tunneler.  A 6 mm external ring PTFE graft was brought through the tunnel.  The patient was fully heparinized.  Heparin levels were monitored with ACT measurements and redosed appropriately.  Next, the vessels were occluded with vascular clamps.  A #11 blade was used to make an arteriotomy in the anterior surface of the common femoral artery.  This was extended with Potts scissors.  With the assistance of the PA providing countertraction and suction for exposure, I performed endarterectomy of the common femoral artery.  The plaque extended into the profundofemoral artery.  I was able to perform an eversion endarterectomy.  Once I was satisfied with the results of the endarterectomy, the PTFE graft was beveled to fit the size the arteriotomy.  A running anastomosis was created with 6-0 Prolene.  The PA helped follow the suture.  Upon completion the clamps were released.  Several repair stitches were required for hemostasis.  There was excellent flow within the bypass graft.  Next  a tourniquet was placed in the upper thigh.  The leg was exsanguinated with an Esmarch and the tourniquet was inflated.  I then performed an arteriotomy with a #11 blade which extended longitudinally with Potts scissors.  There is circumferential calcification within the  artery.  The artery appeared very fragile and so I elected not to perform a endarterectomy.  I did have to clamp the artery for hemostasis with baby Gregory clamps.  The leg was then straightened.  The graft was cut to fit the length of the arteriotomy and then beveled.  A running anastomosis was created with 6-0 Prolene.  Prior to completion the appropriate flushing maneuvers were performed and the anastomosis was completed.  Blood flow was reestablished to the leg.  The patient had a triphasic anterior tibial Doppler signal which was graft dependent.  Several repair stitches were required for hemostasis.  Next, the patient's wounds were irrigated with heparin saline.  Hemostasis was achieved.  The femoral sheath was closed with 2-0 Vicryl.  The remaining subcutaneous tissue was closed with 3-0 Vicryl followed by subcuticular closure in the groin.  The below-knee incision was closed by reapproximating the fascia with 2-0 Vicryl, subcutaneous tissue with 3-0 Vicryl and subcuticular closure.  Dermabond was placed on the incisions.  At this point, Dr. Sharol Given came in and performed the toe amputation.  The patient tolerated procedure well.  He was successfully extubated and taken recovery in stable condition. ? ? ?Disposition: To PACU stable. ? ? ?V. Annamarie Major, M.D., FACS ?[facility] ?Office: [phone] ?Pager:  [phone]  ?

## 2022-02-23 NOTE — Consult Note (Signed)
Initial Consultation Note ? ? ?Patient: Tyler Mueller FVC:[id number] DOB: Mar 16, 1941 PCP: Mikey College, MD ?DOA: 02/23/2022 ?DOS: the patient was seen and examined on 02/23/2022 ?Primary service: Serafina Mitchell, MD ? ?Referring physician: Harold Barban, MD ?Reason for consult: Medical management ? ? ?Assessment and Plan: ?PVD (peripheral vascular disease) ([place]) s/p femoral-popliteal bypass surgery ?Patient noted arterial insufficiency contributing to inability wound healing of his right third toe.  Patient underwent angiography via left femoral approach her limbs which. ? ?Essential hypertension ?Blood pressures elevated from 119/49 to 171/77.  Home blood pressure regimen includes amlodipine 5 mg daily holding for systolic blood pressures less than 100, Coreg 25 mg q. evening on dialysis days at twice daily on all other days. ?-Continue current blood pressure regimen as tolerated ? ?ESRD on hemodialysis ([place]) ?Patient normally dialyzes on Tuesday, Thursday, Saturday. ?-Nephrology consulted for possible need of dialysis during hospital stay ? ?History of lung transplant ([place]) secondary to bronchiectasis ?Patient with a history of bilateral liver transplant in 2004 followed at [facility].  Home medication regimen includes Dr. Raynelle Bring 0.5 mg twice daily, prednisone 5 mg daily, Bactrim 100-800 mg daily, azithromycin 250 mg MWF. ?-Continue current medication regimen ? ?Type 2 diabetes mellitus with peripheral neuropathy (HCC) ?Patient was noted to be hypoglycemic with initial blood glucose of 58.  Last hemoglobin A1c was 4.9 11/27/2021.  Home insulin regimen includes sliding scale of insulin 4-6 units with meals.  ?-Hypoglycemic protocol ?-1 Amp of D50 as needed for blood sugars less than 70 ?-CBGs before every meal with very sensitive SSI  ?-Adjust insulin regimen as needed. ? ? ? ? ? ? ?TRH will continue to follow the patient. ? ?HPI: Tyler Mueller is a 82 y.o. male with past medical history of hypertension,  bronchiectasis s/p bilateral lung transplant on chronic immunosuppression followed at [facility], ESRD on HD(T/Th/Sa), and and diabetes mellitus type 2 who was referred initially to vascular surgery in the setting of right third toe gangrene.  His noninvasive studies suggested arterial insufficiency contributing to inability for wound healing. He was taken for a right femoral-popliteal artery bypass this morning with vascular surgery as well as third toe amputation.  Patient reports that he was otherwise in his normal state of health and denied having any significant fevers, chills, or shortness of breath.  He has some memory loss and is also hard of hearing.  His wife notes that last week he had 1 day of diarrhea from some bug that was going around, but otherwise since that time had been in his normal state of health.  Initial labs i-STAT Chem-8 noted hemoglobin 11.2, potassium 4.5, BUN 29, creatinine 5.9, ionized calcium 1.11, and glucose 58. ? ?Review of Systems: As mentioned in the history of present illness. All other systems reviewed and are negative. ?Past Medical History:  ?Diagnosis Date  ? Chronic kidney disease   ? RENAL INSUFFICIENCY  ? Diabetes mellitus without complication ([place])   ? Diverticulitis   ? History of lung transplant ([place]) 04/21/2014  ? Hypertension   ? Lung transplanted ([place]) 12/05/2002  ? BILATERAL   ? Muscle spasm of back 04/21/2014  ? Skin cancer of face   ? ?Past Surgical History:  ?Procedure Laterality Date  ? ABDOMINAL AORTOGRAM W/LOWER EXTREMITY Right 02/22/2022  ? Procedure: ABDOMINAL AORTOGRAM W/LOWER EXTREMITY;  Surgeon: Serafina Mitchell, MD;  Location: [facility];  Service: Cardiovascular;  Laterality: Right;  ? APPENDECTOMY    ? CATARACT EXTRACTION    ?  LUNG TRANSPLANT, DOUBLE  2004  ? PERIPHERAL VASCULAR BALLOON ANGIOPLASTY Right 02/22/2022  ? Procedure: PERIPHERAL VASCULAR BALLOON ANGIOPLASTY;  Surgeon: Serafina Mitchell, MD;  Location: [place] [facility];  Service:  Cardiovascular;  Laterality: Right;  SFA unable to cross lesion  ? ?Social History:  reports that he quit smoking about 18 years ago. His smoking use included cigarettes. He has never used smokeless tobacco. He reports that he does not drink alcohol and does not use drugs. ? ?Allergies  ?Allergen Reactions  ? Lorazepam Other (See Comments)  ?  delirium ?  ? Morphine And Related Nausea And Vomiting and Other (See Comments)  ?  Hallucinations   ? Nsaids Other (See Comments)  ?   Kidney disorder  ? Penicillins Nausea And Vomiting and Other (See Comments)  ?  Hallucinations   ? Amphetamine-Dextroamphetamine Anxiety and Other (See Comments)  ?  confusion ?  ? ? ?History reviewed. No pertinent family history. ? ?Prior to Admission medications   ?Medication Sig Start Date End Date Taking? Authorizing Provider  ?acetaminophen (TYLENOL) 500 MG tablet Take 500 mg by mouth every 6 (six) hours as needed (for pain.).   Yes [provider]  ?acyclovir (ZOVIRAX) 200 MG capsule Take 4 capsules (800 mg total) by mouth daily. 04/27/14  Yes Jerrye Noble, MD  ?Alpha-D-Galactosidase (BEANO PO) Take 1 tablet by mouth as needed (gas/bloating).   Yes [provider]  ?amLODipine (NORVASC) 5 MG tablet Take 5 mg by mouth daily. Hold for systolic bp less than 630.   Yes [provider]  ?aspirin EC 81 MG tablet Take 81 mg by mouth daily.   Yes [provider]  ?azithromycin (ZITHROMAX) 250 MG tablet MAY ADMINISTER WITHOUT REGARD TO MEALS ?Patient taking differently: Take 250 mg by mouth every Monday, Wednesday, and Friday. 12/01/21  Yes Timothy Lasso, MD  ?beta carotene w/minerals (OCUVITE) tablet Take 1 tablet by mouth in the morning and at bedtime.   Yes [provider]  ?calcium acetate (PHOSLO) 667 MG capsule Take 667 mg by mouth 2 (two) times daily with a meal.   Yes [provider]  ?Calcium-Magnesium-Zinc (CAL-MAG-ZINC PO) Take 1 tablet by mouth in the morning.   Yes [provider]  ?carvedilol (COREG) 25 MG tablet Take 25 mg by mouth See admin instructions. Take 1 tablet (25 mg) by mouth in the evening on Tuesdays, Thursdays & Saturdays.  ?Take 1 tablet (25 mg) by mouth twice daily on Sundays, Mondays, Wednesdays, & Fridays.   Yes [provider]  ?famotidine (PEPCID) 20 MG tablet Take 20 mg by mouth in the morning.   Yes [provider]  ?ferrous gluconate (FERGON) 225 (27 FE) MG tablet Take 240 mg by mouth every evening.   Yes [provider]  ?gabapentin (NEURONTIN) 300 MG capsule Take 300 mg by mouth at bedtime.   Yes [provider]  ?lanthanum (FOSRENOL) 500 MG chewable tablet Chew 1,500 mg by mouth in the morning and at bedtime.   Yes [provider]  ?Multiple Vitamin (MULTIVITAMIN WITH MINERALS) TABS tablet Take 1 tablet by mouth daily.   Yes [provider]  ?potassium chloride SA (K-DUR,KLOR-CON) 20 MEQ tablet Take 1 tablet (20 mEq total) by mouth daily. 1/60/10  Yes Delora Fuel, MD  ?pravastatin (PRAVACHOL) 40 MG tablet Take 20 mg by mouth every evening.   Yes [provider]  ?predniSONE (DELTASONE) 5 MG tablet Take 5 mg by mouth daily.   Yes  [provider]  ?sulfamethoxazole-trimethoprim (BACTRIM,SEPTRA) 400-80 MG per tablet Take 1 tablet by mouth every evening.   Yes [provider]  ?tacrolimus (PROGRAF) 0.5 MG capsule Take 0.5 mg by mouth in the morning and at bedtime.   Yes [provider]  ?UNKNOWN TO PATIENT Inject 4-6 Units into the skin 3 (three) times daily with meals as needed. Sliding scale   Yes [provider]  ? ? ?Physical Exam: ?Vitals:  ? 02/22/22 1848 02/23/22 0556  ?BP:  (!) 149/43  ?Pulse:  78  ?Resp:  17  ?Temp:  97.8 ?F (36.6 ?C)  ?TempSrc:  Oral  ?SpO2:  99%  ?Weight: 52.2 kg 52.2 kg  ?Height: '5\' 8"'$  (1.727 m) '5\' 8"'$  (1.727 m)  ? ? ? ?Constitutional: Elderly male currently in NAD, calm, comfortable ?Eyes: PERRL, lids and conjunctivae  normal ?ENMT: Mucous membranes are moist. Posterior pharynx clear of any exudate or lesions. Hard of hearing. ?Neck: normal, supple, no masses, no thyromegaly ?Respiratory: clear to auscultation bilaterally, no wheezing, no crackles

## 2022-02-23 NOTE — OR Nursing (Deleted)
Attempted to place a urinary catheter intra-op in [facility] OR 16. Catheter went in with ease, but no urine return. The catheter was then removed and we proceeded with the scheduled surgery.  ?

## 2022-02-23 NOTE — Assessment & Plan Note (Addendum)
Continue current blood pressure medication regimen. ? ? ?

## 2022-02-24 ENCOUNTER — Encounter (HOSPITAL_COMMUNITY): Payer: Self-pay | Admitting: Surgery

## 2022-02-24 DIAGNOSIS — N186 End stage renal disease: Secondary | ICD-10-CM | POA: Diagnosis not present

## 2022-02-24 DIAGNOSIS — I739 Peripheral vascular disease, unspecified: Secondary | ICD-10-CM | POA: Diagnosis not present

## 2022-02-24 DIAGNOSIS — M869 Osteomyelitis, unspecified: Secondary | ICD-10-CM | POA: Diagnosis not present

## 2022-02-24 DIAGNOSIS — J479 Bronchiectasis, uncomplicated: Secondary | ICD-10-CM | POA: Diagnosis not present

## 2022-02-24 LAB — BASIC METABOLIC PANEL
Anion gap: 12 (ref 5–15)
BUN: 47 mg/dL — ABNORMAL HIGH (ref 8–23)
CO2: 21 mmol/L — ABNORMAL LOW (ref 22–32)
Calcium: 7.8 mg/dL — ABNORMAL LOW (ref 8.9–10.3)
Chloride: 98 mmol/L (ref 98–111)
Creatinine, Ser: 6.62 mg/dL — ABNORMAL HIGH (ref 0.61–1.24)
GFR, Estimated: 8 mL/min — ABNORMAL LOW (ref >60)
Glucose, Bld: 260 mg/dL — ABNORMAL HIGH (ref 70–99)
Potassium: 5.8 mmol/L — ABNORMAL HIGH (ref 3.5–5.1)
Sodium: 131 mmol/L — ABNORMAL LOW (ref 135–145)

## 2022-02-24 LAB — GLUCOSE, CAPILLARY
Glucose-Capillary: 226 mg/dL — ABNORMAL HIGH (ref 70–99)
Glucose-Capillary: 143 mg/dL — ABNORMAL HIGH (ref 70–99)
Glucose-Capillary: 270 mg/dL — ABNORMAL HIGH (ref 70–99)
Glucose-Capillary: 266 mg/dL — ABNORMAL HIGH (ref 70–99)

## 2022-02-24 LAB — CBC
HCT: 26.6 % — ABNORMAL LOW (ref 39.0–52.0)
Hemoglobin: 8 g/dL — ABNORMAL LOW (ref 13.0–17.0)
MCH: 29.3 pg (ref 26.0–34.0)
MCHC: 30.1 g/dL (ref 30.0–36.0)
MCV: 97.4 fL (ref 80.0–100.0)
Platelets: 198 10*3/uL (ref 150–400)
RBC: 2.73 MIL/uL — ABNORMAL LOW (ref 4.22–5.81)
RDW: 19.9 % — ABNORMAL HIGH (ref 11.5–15.5)
WBC: 10.2 10*3/uL (ref 4.0–10.5)
nRBC: 0.3 % — ABNORMAL HIGH (ref 0.0–0.2)

## 2022-02-24 LAB — POCT ACTIVATED CLOTTING TIME
Activated Clotting Time: 245 seconds
Activated Clotting Time: 215 seconds

## 2022-02-24 LAB — LIPID PANEL
Cholesterol: 114 mg/dL (ref 0–200)
HDL: 46 mg/dL (ref >40)
LDL Cholesterol: 58 mg/dL (ref 0–99)
Total CHOL/HDL Ratio: 2.5 RATIO
Triglycerides: 50 mg/dL (ref <150)
VLDL: 10 mg/dL (ref 0–40)

## 2022-02-24 LAB — HEPATITIS B SURFACE ANTIBODY, QUANTITATIVE: Hep B S AB Quant (Post): 3.1 m[IU]/mL — ABNORMAL LOW (ref >9.9)

## 2022-02-24 MED ORDER — ACYCLOVIR 400 MG PO TABS
800.0000 mg | ORAL_TABLET | Freq: Every day | ORAL | Status: DC
  Administered 2022-02-24 – 2022-02-25 (×2): 800 mg via ORAL
  Filled 2022-02-24: qty 2
  Filled 2022-02-24: qty 1
  Filled 2022-02-24: qty 2

## 2022-02-24 MED ORDER — INSULIN ASPART 100 UNIT/ML IJ SOLN
0.0000 [IU] | Freq: Every day | INTRAMUSCULAR | Status: DC
  Administered 2022-02-24: 3 [IU] via SUBCUTANEOUS
  Administered 2022-02-27: 2 [IU] via SUBCUTANEOUS
  Administered 2022-02-28: 3 [IU] via SUBCUTANEOUS

## 2022-02-24 MED ORDER — METOPROLOL TARTRATE 5 MG/5ML IV SOLN
5.0000 mg | INTRAVENOUS | Status: DC | PRN
Start: 2022-02-24 — End: 2022-03-02

## 2022-02-24 MED ORDER — SENNOSIDES-DOCUSATE SODIUM 8.6-50 MG PO TABS: 1.0000 | ORAL_TABLET | Freq: Every evening | ORAL | Status: DC | PRN

## 2022-02-24 MED ORDER — INSULIN ASPART 100 UNIT/ML IJ SOLN
0.0000 [IU] | Freq: Three times a day (TID) | INTRAMUSCULAR | Status: DC
  Administered 2022-02-25: 2 [IU] via SUBCUTANEOUS
  Administered 2022-02-25 – 2022-02-27 (×2): 3 [IU] via SUBCUTANEOUS
  Administered 2022-02-28: 5 [IU] via SUBCUTANEOUS
  Administered 2022-03-01: 2 [IU] via SUBCUTANEOUS

## 2022-02-24 MED ORDER — DOXERCALCIFEROL 4 MCG/2ML IV SOLN
3.0000 ug | INTRAVENOUS | Status: DC
  Administered 2022-02-24 – 2022-03-01 (×2): 3 ug via INTRAVENOUS
  Filled 2022-02-24 (×4): qty 2

## 2022-02-24 MED ORDER — HYDRALAZINE HCL 20 MG/ML IJ SOLN: 10.0000 mg | INTRAMUSCULAR | Status: DC | PRN

## 2022-02-24 MED ORDER — TRAZODONE HCL 50 MG PO TABS: 50.0000 mg | ORAL_TABLET | Freq: Every evening | ORAL | Status: DC | PRN

## 2022-02-24 MED ORDER — ALBUMIN HUMAN 25 % IV SOLN
INTRAVENOUS | Status: AC
  Administered 2022-02-24: 12.5 g
  Filled 2022-02-24: qty 50

## 2022-02-24 MED ORDER — CLOPIDOGREL BISULFATE 75 MG PO TABS
75.0000 mg | ORAL_TABLET | Freq: Every day | ORAL | Status: DC
  Administered 2022-02-24 – 2022-03-01 (×6): 75 mg via ORAL
  Filled 2022-02-24 (×6): qty 1

## 2022-02-24 MED ORDER — IPRATROPIUM-ALBUTEROL 0.5-2.5 (3) MG/3ML IN SOLN: 3.0000 mL | RESPIRATORY_TRACT | Status: DC | PRN

## 2022-02-24 NOTE — Anesthesia Postprocedure Evaluation (Signed)
Anesthesia Post Note ? ?Patient: Tyler Mueller ? ?Procedure(s) Performed: RIGHT FEMORAL-POPLITEAL ARTERY BYPASS (Right: Groin) ?RIGHT FEMORAL ENDARTERECTOMY (Right: Groin) ?AMPUTATION THIRD RIGHT TOE (Right: Toe) ?APPLICATION OF WOUND VAC (Right: Foot) ?APPLICATION OF SKIN SUBSTITUTE KERECIS [employer] 19cm (Right: Foot) ? ?  ? ?Patient location during evaluation: PACU ?Anesthesia Type: General ?Level of consciousness: sedated and patient cooperative ?Pain management: pain level controlled ?Vital Signs Assessment: post-procedure vital signs reviewed and stable ?Respiratory status: spontaneous breathing ?Cardiovascular status: stable ?Anesthetic complications: no ? ? ?No notable events documented. ? ?Last Vitals:  ?Vitals:  ? 02/23/22 2353 02/24/22 0432  ?BP: (!) 105/44 117/65  ?Pulse: 63 65  ?Resp: 15 16  ?Temp: 36.4 ?C 36.5 ?C  ?SpO2: 97% 100%  ?  ?Last Pain:  ?Vitals:  ? 02/24/22 0716  ?TempSrc:   ?PainSc: Asleep  ? ? ?  ?  ?  ?  ?  ?  ? ?Nolon Nations ? ? ? ? ?

## 2022-02-24 NOTE — Progress Notes (Signed)
PT Cancellation Note ? ?Patient Details ?Name: Tyler Mueller ?MRN: [id number] ?DOB: 03/03/1941 ? ? ?Cancelled Treatment:    Reason Eval/Treat Not Completed: Patient at procedure or test/unavailable. Pt at HD treatment. Will plan to follow-up later as time permits. ? ? ?Moishe Spice, PT, DPT ?Acute Rehabilitation Services  ?Pager: [phone] ?Office: [phone] ? ? ? ?[name] ?02/24/2022, 8:59 AM ? ? ?

## 2022-02-24 NOTE — Assessment & Plan Note (Signed)
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

## 2022-02-24 NOTE — Plan of Care (Signed)

## 2022-02-24 NOTE — Evaluation (Signed)
Occupational Therapy Evaluation ?Patient Details ?Name: Tyler Mueller ?MRN: [id number] ?DOB: 07-02-1941 ?Today's Date: 02/24/2022 ? ? ?History of Present Illness 81 y/o male who was admitted 02/23/22 with gangrene on R 3rd toe. S/p abdominal aortogram w/ RLE and peripheral vascular balloon angioplasty (right) on 02/22/22. S/p Right femoral to below-knee popliteal artery bypass graft and Right common femoral and profundofemoral endarterectomy as well as amputation of the third ray with wound vac on 02/23/22. PMH includes Diabetes mellitus without complication, Diverticulitis, ESRD on hemodialysis (T, Th, Sat), History of lung transplant (04/21/2014; 12/05/2002), HTN, and Skin cancer of face, Cataract extraction; Appendectomy. Recently admitted in December 2022 due to flu, frequent falls.  ? ?Clinical Impression ?  ?PTA, pt was living his wife and was independent; using a cane for mobility. Pt currently requiring Mod A for LB ADLs and Min guard A for functional mobility with RW. Pt presenting with decreased ROM and balance. Pt would benefit from further acute OT to facilitate safe dc. Recommend dc to home once medically stable per physician. ?   ? ?Recommendations for follow up therapy are one component of a multi-disciplinary discharge planning process, led by the attending physician.  Recommendations may be updated based on patient status, additional functional criteria and insurance authorization.  ? ?Follow Up Recommendations ? No OT follow up  ?  ?Assistance Recommended at Discharge Frequent or constant Supervision/Assistance  ?Patient can return home with the following A little help with walking and/or transfers;A little help with bathing/dressing/bathroom ? ?  ?Functional Status Assessment ? Patient has had a recent decline in their functional status and demonstrates the ability to make significant improvements in function in a reasonable and predictable amount of time.  ?Equipment Recommendations ? None  recommended by OT  ?  ?Recommendations for Other Services PT consult ? ? ?  ?Precautions / Restrictions Precautions ?Precautions: Fall ?Restrictions ?Weight Bearing Restrictions: Yes ?RLE Weight Bearing: Partial weight bearing ?RLE Partial Weight Bearing Percentage or Pounds: Partial WB - Heel only ?Other Position/Activity Restrictions: wound vac RLE  ? ?  ? ?Mobility Bed Mobility ?Overal bed mobility: Needs Assistance ?Bed Mobility: Supine to Sit ?  ?  ?Supine to sit: HOB elevated, Min assist ?  ?  ?General bed mobility comments: Min A for assisting RLE ?  ? ?Transfers ?Overall transfer level: Needs assistance ?Equipment used: Rolling walker (2 wheels) ?Transfers: Sit to/from Stand ?Sit to Stand: Min guard ?  ?  ?  ?  ?  ?General transfer comment: Min guard A for safety ?  ? ?  ?Balance Overall balance assessment: Needs assistance ?Sitting-balance support: No upper extremity supported, Feet supported ?Sitting balance-Leahy Scale: Fair ?  ?  ?Standing balance support: Bilateral upper extremity supported, During functional activity ?Standing balance-Leahy Scale: Poor ?  ?  ?  ?  ?  ?  ?  ?  ?  ?  ?  ?  ?   ? ?ADL either performed or assessed with clinical judgement  ? ?ADL Overall ADL's : Needs assistance/impaired ?Eating/Feeding: Set up;Sitting ?  ?Grooming: Set up;Sitting ?  ?Upper Body Bathing: Set up;Sitting;Supervision/ safety ?  ?Lower Body Bathing: Moderate assistance;Sit to/from stand ?  ?Upper Body Dressing : Supervision/safety;Set up;Sitting ?  ?Lower Body Dressing: Moderate assistance;Sit to/from stand ?Lower Body Dressing Details (indicate cue type and reason): assistance for donning R sock. Requiring further education on compensatory techniques ?Toilet Transfer: Min guard;Ambulation;Rolling walker (2 wheels) (simulated to recliner) ?Toilet Transfer Details (indicate cue type and reason): Min guard  A for safety ?  ?  ?  ?  ?Functional mobility during ADLs: Min guard;Rolling walker (2 wheels) ?General ADL  Comments: Pt with decreased ROM and balance  ? ? ? ?Vision   ?   ?   ?Perception   ?  ?Praxis   ?  ? ?Pertinent Vitals/Pain Pain Assessment ?Pain Assessment: 0-10 ?Pain Score: 7  ?Pain Intervention(s): Monitored during session, Limited activity within patient's tolerance, Repositioned  ? ? ? ?Hand Dominance Right ?  ?Extremity/Trunk Assessment Upper Extremity Assessment ?Upper Extremity Assessment: Overall WFL for tasks assessed ?  ?Lower Extremity Assessment ?Lower Extremity Assessment: Defer to PT evaluation ?  ?Cervical / Trunk Assessment ?Cervical / Trunk Assessment: Kyphotic ?  ?Communication Communication ?Communication: No difficulties ?  ?Cognition Arousal/Alertness: Awake/alert ?Behavior During Therapy: [facility] for tasks assessed/performed ?Overall Cognitive Status: Within Functional Limits for tasks assessed ?  ?  ?  ?  ?  ?  ?  ?  ?  ?  ?  ?  ?  ?  ?  ?  ?General Comments: Feel pt is at baseline. Slight decreased in deficit awareness ?  ?  ?General Comments  Wife present throughout ? ?  ?Exercises   ?  ?Shoulder Instructions    ? ? ?Home Living Family/patient expects to be discharged to:: Private residence ?Living Arrangements: Spouse/significant other ?Available Help at Discharge: Family;Available 24 hours/day ?Type of Home: House ?Home Access: Stairs to enter ?Entrance Stairs-Number of Steps: 2 ?Entrance Stairs-Rails: Right ?Home Layout: One level ?  ?  ?Bathroom Shower/Tub: Walk-in shower ?  ?Bathroom Toilet: Handicapped height ?Bathroom Accessibility: Yes ?  ?Home Equipment: Rollator (4 wheels);Cane - single Barista (2 wheels);Shower seat;Hand held shower head;Grab bars - tub/shower ?  ?  ?  ? ?  ?Prior Functioning/Environment Prior Level of Function : Independent/Modified Independent;Driving ?  ?  ?  ?  ?  ?  ?Mobility Comments: Uses a cane for mobility ?ADLs Comments: Independent with bathing and dressing.  Patient still drives.  Helps a little with meals and home management. ?  ? ?  ?  ?OT  Problem List: Decreased strength;Decreased range of motion;Decreased activity tolerance;Impaired balance (sitting and/or standing);Decreased knowledge of use of DME or AE;Decreased knowledge of precautions ?  ?   ?OT Treatment/Interventions: Self-care/ADL training;Therapeutic exercise;Energy conservation;DME and/or AE instruction;Therapeutic activities;Patient/family education  ?  ?OT Goals(Current goals can be found in the care plan section) Acute Rehab OT Goals ?Patient Stated Goal: Go home ?OT Goal Formulation: With patient ?Time For Goal Achievement: 03/10/22 ?Potential to Achieve Goals: Good  ?OT Frequency: Min 2X/week ?  ? ?Co-evaluation   ?  ?  ?  ?  ? ?  ?AM-PAC OT "6 Clicks" Daily Activity     ?Outcome Measure Help from another person eating meals?: None ?Help from another person taking care of personal grooming?: A Little ?Help from another person toileting, which includes using toliet, bedpan, or urinal?: A Little ?Help from another person bathing (including washing, rinsing, drying)?: A Lot ?Help from another person to put on and taking off regular upper body clothing?: A Little ?Help from another person to put on and taking off regular lower body clothing?: A Lot ?6 Click Score: 17 ?  ?End of Session Equipment Utilized During Treatment: Rolling walker (2 wheels) ?Nurse Communication: Mobility status ? ?Activity Tolerance: Patient tolerated treatment well ?Patient left: in chair;with call bell/phone within reach;with chair alarm set;with family/visitor present ? ?OT Visit Diagnosis: Other abnormalities of gait and mobility (  R26.89);Muscle weakness (generalized) (M62.81);Unsteadiness on feet (R26.81)  ?              ?Time: 0037-0488 ?OT Time Calculation (min): 22 min ?Charges:  OT General Charges ?$OT Visit: 1 Visit ?OT Evaluation ?$OT Eval Moderate Complexity: 1 Mod ? ?Milan Perkins MSOT, OTR/L ?Acute Rehab ?Pager: [phone] ?Office: [phone] ? ?Josepha Barbier M Ellina Sivertsen ?02/24/2022, 4:07 PM ?

## 2022-02-24 NOTE — Progress Notes (Signed)
PROGRESS NOTE    Tyler Mueller  ZOX:[id number] DOB: 01/22/1941 DOA: 02/23/2022 PCP: Westley Foots, MD   Brief Narrative:  81 year old with history of ESRD on hemodialysis, lung transplant in 2004 on immunosuppressants, HTN, bronchiectasis, DM2 presents to the hospital with complaints of diabetic foot ulcer of the right toe.  He underwent revascularization and amputation with wound VAC placement on 02/23/2022 by vascular and orthopedic.  Medical team consulted to help assist with management.   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  He is status post bilateral lung transplant on chronic immunosuppressant follows at [facility].  Essential hypertension Continue current blood pressure medication regimen.    History of lung transplant (HCC) secondary to bronchiectasis Status post bilateral lung transplant at [facility] in 2004. Home medications- Prograf, prednisone, Bactrim   Type 2 diabetes mellitus with peripheral neuropathy (HCC) Sliding scale and Accu-Chek A1c  02/23/2022-5.8      DVT prophylaxis: Subcu heparin Code Status: Full code Family Communication:        Subjective: Patient seen in hemodialysis, does not have any complaints.  Overall doing well.   Examination:  General exam: Appears calm and comfortable  Respiratory system: Clear to auscultation. Respiratory effort normal. Cardiovascular system: S1 & S2 heard, RRR. No JVD, murmurs, rubs, gallops or clicks. No pedal edema. Gastrointestinal system: Abdomen is nondistended, soft and nontender. No organomegaly or masses felt. Normal bowel sounds heard. Central nervous system: Alert and oriented. No focal neurological deficits. Extremities: Symmetric 5 x 5 power. Skin: Amputation site noted with dressing and wound VAC in place Psychiatry: Judgement and insight appear normal. Mood & affect appropriate.     Objective: Vitals:   02/23/22 1952 02/23/22 2105 02/23/22 2353 02/24/22 0432  BP: (!) 113/52  (!) 105/44 117/65  Pulse: 69 65 63 65  Resp: 20 16 15 16   Temp: 97.6 F (36.4 C)  97.6 F (36.4 C) 97.7 F (36.5 C)  TempSrc: Oral  Axillary Oral  SpO2: 100% 100% 97% 100%  Weight:      Height:        Intake/Output Summary (Last 24 hours) at 02/24/2022 0801 Last data filed at 02/23/2022 1130 Gross per 24 hour  Intake 700 ml  Output 100 ml  Net 600 ml   Filed Weights   02/22/22 1848 02/23/22 0556  Weight: 52.2 kg 52.2 kg     Data Reviewed:   CBC: Recent Labs  Lab 02/22/22 0718 02/23/22 0622 02/23/22 1627 02/24/22 0505  WBC  --   --  9.9 10.2  HGB 11.9* 11.2* 9.3* 8.0*  HCT 35.0* 33.0* 29.7* 26.6*  MCV  --   --  96.7  97.4  PLT  --   --  214 198   Basic Metabolic Panel: Recent Labs  Lab 02/22/22 0718 02/23/22 0622 02/23/22 1627 02/24/22 0505  NA 140 137  --  131*  K 3.9 4.5  --  5.8*  CL 100 102  --  98  CO2  --   --   --  21*  GLUCOSE 130* 58*  --  260*  BUN 22 29*  --  47*  CREATININE 4.40* 5.90* 5.90* 6.62*  CALCIUM  --   --   --  7.8*    GFR: Estimated Creatinine Clearance: 6.8 mL/min (A) (by C-G formula based on SCr of 6.62 mg/dL (H)). Liver Function Tests: No results for input(s): AST, ALT, ALKPHOS, BILITOT, PROT, ALBUMIN in the last 168 hours. No results for input(s): LIPASE, AMYLASE in the last 168 hours. No results for input(s): AMMONIA in the last 168 hours. Coagulation Profile: Recent Labs  Lab 02/22/22 1338  INR 1.2   Cardiac Enzymes: No results for input(s): CKTOTAL, CKMB, CKMBINDEX, TROPONINI in the last 168 hours. BNP (last 3 results) No results for input(s): PROBNP in the last 8760 hours. HbA1C: Recent Labs    02/23/22 1627  HGBA1C 5.8*   CBG: Recent Labs  Lab 02/23/22 0559 02/23/22 1200 02/23/22 1726 02/23/22 2147 02/24/22 0623  GLUCAP 74 169* 229* 258* 226*   Lipid Profile: Recent Labs    02/24/22 0505  CHOL 114  HDL 46  LDLCALC 58  TRIG 50  CHOLHDL 2.5   Thyroid Function Tests: No results for input(s): TSH, T4TOTAL, FREET4, T3FREE, THYROIDAB in the last 72 hours. Anemia Panel: No results for input(s): VITAMINB12, FOLATE, FERRITIN, TIBC, IRON, RETICCTPCT in the last 72 hours. Sepsis Labs: No results for input(s): PROCALCITON, LATICACIDVEN in the last 168 hours.  Recent Results (from the past 240 hour(s))  Surgical PCR Screen     Status: Abnormal   Collection Time: 02/22/22  1:18 PM   Specimen: Nasal Mucosa; Nasal Swab  Result Value Ref Range Status   MRSA, PCR NEGATIVE NEGATIVE Final   Staphylococcus aureus POSITIVE (A) NEGATIVE Final    Comment: (NOTE) The Xpert SA Assay (FDA approved for NASAL specimens in patients 16 years of age and older), is one component of a comprehensive surveillance program. It is not intended to diagnose infection nor to guide or monitor treatment. Performed at [facility], [address], [place], [place] [address]          Radiology Studies: PERIPHERAL VASCULAR CATHETERIZATION  Result Date: 02/22/2022 Images from the  original result were not included. Patient name: Tyler Mueller MRN: [id number] DOB: [birth year] Sex: male 02/22/2022 Pre-operative Diagnosis: Right toe ulcer Post-operative diagnosis:  Same Surgeon:  Durene Cal Procedure Performed:  1.  Ultrasound-guided access, left femoral artery  2.  Abdominal aortogram  3.  Right lower extremity runoff  4.  Failed right popliteal angioplasty  5.  Conscious sedation, 42 minutes  6.  Closure device, Celt Indications: This is a 81 year old gentleman with a third toe wound on the right.  He comes in today for angiographic evaluation Procedure:  The patient was identified in the holding area and taken to room 8.  The patient was then placed supine on the table and prepped and draped in the usual sterile fashion.  A time out was called.  Conscious sedation was administered with the use of IV fentanyl and Versed under continuous physician and nurse monitoring.  Heart rate, blood pressure, and  oxygen saturation were continuously monitored.  Total sedation time was 42 minutes.  Ultrasound was used to evaluate the left common femoral artery.  It was patent .  A digital ultrasound image was acquired.  A micropuncture needle was used to access the left common femoral artery under ultrasound guidance.  An 018 wire was advanced without resistance and a micropuncture sheath was placed.  The 018 wire was removed and a benson wire was placed.  The micropuncture sheath was exchanged for a 5 french sheath.  An omniflush catheter was advanced over the wire to the level of L-1.  An abdominal angiogram was obtained.  Next, using the omniflush catheter and a benson wire, the aortic bifurcation was crossed and the catheter was placed into theright external iliac artery and right runoff was obtained.  Findings:  Aortogram: No significant aortic stenosis.  Bilateral common and external iliac arteries are widely patent  Right Lower Extremity: Right common femoral profundofemoral artery widely patent.   The superficial femoral artery is heavily calcified with patent throughout its course.  The popliteal artery is occluded with reconstitution of the below-knee popliteal artery.  The dominant runoff is anterior tibial artery which occludes at the ankle.  There is diffuse disease out onto the foot.  Intervention: After the above images were acquired the decision was made to proceed with intervention.  A 6 French 55 cm Ansell 1 sheath was advanced into the right superficial femoral artery.  I then attempted to cross the popliteal occlusion using a quick cross catheter and a Glidewire and a Glidewire advantage.  Ultimately I was unsuccessful in gaining reentry.  I had created a false lumen and did not feel that I would ever get back in.  Because of the disease on his foot, I did not feel he was a good pedal access.  The groin was then closed with a Celt. Impression:  #1  Right popliteal artery occlusion, unable to be recanalized  #2  Patient will be evaluated for a right femoral to below-knee popliteal artery bypass graft  #3  Dominant runoff is anterior tibial.  This occludes at the ankle.  There is diffuse disease out onto the foot. Juleen China, M.D., FACS [facility] Office: [phone] Pager:  [phone]   VAS [place] LOWER EXTREMITY SAPHENOUS VEIN MAPPING  Result Date: 02/22/2022 LOWER EXTREMITY VEIN MAPPING Patient Name:  Tyler Mueller  Date of Exam:   02/22/2022 Medical Rec #: [id number]         Accession #:    [id number] Date of Birth: Nov 17, 1941         Patient Gender: M Patient Age:   60 years Exam Location:  [facility] Procedure:      VAS [place] LOWER EXTREMITY SAPHENOUS VEIN MAPPING Referring Phys: Coral Else --------------------------------------------------------------------------------  Indications:  Pre-op Risk Factors: PAD.  Comparison Study: No prior study Performing Technologist: Gertie Fey MHA, RDMS, RVT, RDCS  Examination Guidelines: A complete  evaluation includes B-mode imaging, spectral Doppler, color Doppler, and power Doppler as needed of all accessible portions of each vessel. Bilateral testing is considered an integral part of a complete examination. Limited examinations for reoccurring indications may be performed as noted. +----------------+-----------+-----------------------+ RT Diameter (cm)RT Findings          GSV           +----------------+-----------+-----------------------+       0.32  Saphenofemoral Junction +----------------+-----------+-----------------------+       0.39                     Proximal thigh      +----------------+-----------+-----------------------+       0.15                        Mid thigh        +----------------+-----------+-----------------------+       0.19                      Distal thigh       +----------------+-----------+-----------------------+       0.20                          Knee           +----------------+-----------+-----------------------+       0.17                        Prox calf        +----------------+-----------+-----------------------+       0.22                        Mid calf         +----------------+-----------+-----------------------+       0.24                       Distal calf       +----------------+-----------+-----------------------+       0.27                          Ankle          +----------------+-----------+-----------------------+ Diagnosing physician: Waverly Ferrari MD Electronically signed by Waverly Ferrari MD on 02/22/2022 at 4:19:52 PM.    Final         Scheduled Meds:  acyclovir  800 mg Oral Daily   amLODipine  5 mg Oral Daily   aspirin EC  81 mg Oral Daily   azithromycin  250 mg Oral Q M,W,F   calcium acetate  667 mg Oral BID WC   carvedilol  25 mg Oral 2 times per day on Sun Mon Wed Fri   And   carvedilol  25 mg Oral Once per day on Tue Thu Sat   Chlorhexidine Gluconate Cloth  6 each Topical  Q0600   docusate sodium  100 mg Oral Daily   famotidine  20 mg Oral q AM   ferrous gluconate  324 mg Oral Q breakfast   gabapentin  300 mg Oral QHS   heparin  5,000 Units Subcutaneous Q8H   insulin aspart  0-6 Units Subcutaneous TID WC   lanthanum  1,500 mg Oral BID WC   midodrine  5 mg Oral Q T,Th,Sa-HD   multivitamin with minerals  1 tablet Oral Daily   pantoprazole  40 mg Oral Daily   potassium chloride SA  20 mEq Oral Daily   pravastatin  20 mg Oral QPM   predniSONE  5 mg Oral Daily   sulfamethoxazole-trimethoprim  1 tablet Oral QPM   tacrolimus  0.5 mg Oral BID   Continuous Infusions:  sodium chloride     magnesium sulfate bolus IVPB       LOS: 1 day   Time spent= 35 mins  Madilynn Montante Joline Maxcy, MD [facility]  If 7PM-7AM, please contact night-coverage  02/24/2022, 8:01 AM

## 2022-02-24 NOTE — Progress Notes (Signed)
 [facility] Progress Note   Subjective: Seen on HD. Issues with hypotension but initial goal (per pt request) was too high. UFG goal lowered, running even. Rec'd 1 albumin , refused midodrine . Now SBP in 90s, asymptomatic. No excess volume by exam. A & O X 3. Says he feels fine.     Objective Vitals:   02/24/22 0734 02/24/22 0749 02/24/22 0830 02/24/22 0900  BP: 91/64 (!) 96/46 (!) 81/38 (!) 90/44  Pulse: 67 67 62 60  Resp: 14 14 13 16   Temp: 97.8 F (36.6 C)     TempSrc: Oral     SpO2: 100%     Weight: 58.1 kg     Height:       Physical Exam General: Frail appearing elderly male in NAD Heart: S1,S2 RRR  Lungs: CTAB A/P. No WOB.  Abdomen: NABS. NT, ND.  Extremities: Incision R groin CDI. WV to R foot with ACE wrap. No LE edema.  Dialysis Access: L AVF cannulated at present.     Additional Objective Labs: Basic Metabolic Panel: Recent Labs  Lab 02/22/22 0718 02/23/22 0622 02/23/22 1627 02/24/22 0505  NA 140 137  --  131*  K 3.9 4.5  --  5.8*  CL 100 102  --  98  CO2  --   --   --  21*  GLUCOSE 130* 58*  --  260*  BUN 22 29*  --  47*  CREATININE 4.40* 5.90* 5.90* 6.62*  CALCIUM   --   --   --  7.8*   Liver Function Tests: No results for input(s): AST, ALT, ALKPHOS, BILITOT, PROT, ALBUMIN  in the last 168 hours. No results for input(s): LIPASE, AMYLASE in the last 168 hours. CBC: Recent Labs  Lab 02/23/22 0622 02/23/22 1627 02/24/22 0505  WBC  --  9.9 10.2  HGB 11.2* 9.3* 8.0*  HCT 33.0* 29.7* 26.6*  MCV  --  96.7 97.4  PLT  --  214 198   Blood Culture    Component Value Date/Time   SDES STOOL 01/05/2011 0828   SPECREQUEST NONE 01/05/2011 0828   CULT  01/05/2011 0828    NO SALMONELLA, SHIGELLA, CAMPYLOBACTER, OR YERSINIA ISOLATED   REPTSTATUS 01/09/2011 FINAL 01/05/2011 0828    Cardiac Enzymes: No results for input(s): CKTOTAL, CKMB, CKMBINDEX, TROPONINI in the last 168 hours. CBG: Recent Labs  Lab 02/23/22 0559 02/23/22 1200  02/23/22 1726 02/23/22 2147 02/24/22 0623  GLUCAP 74 169* 229* 258* 226*   Iron  Studies: No results for input(s): IRON , TIBC, TRANSFERRIN, FERRITIN in the last 72 hours. @lablastinr3 @ Studies/Results: PERIPHERAL VASCULAR CATHETERIZATION  Result Date: 02/22/2022 Images from the original result were not included. Patient name: Tyler Mueller MRN: [id number] DOB: December 28, 1940 Sex: male 02/22/2022 Pre-operative Diagnosis: Right toe ulcer Post-operative diagnosis:  Same Surgeon:  Malvina New Procedure Performed:  1.  Ultrasound-guided access, left femoral artery  2.  Abdominal aortogram  3.  Right lower extremity runoff  4.  Failed right popliteal angioplasty  5.  Conscious sedation, 42 minutes  6.  Closure device, Celt Indications: This is a 81 year old gentleman with a third toe wound on the right.  He comes in today for angiographic evaluation Procedure:  The patient was identified in the holding area and taken to room 8.  The patient was then placed supine on the table and prepped and draped in the usual sterile fashion.  A time out was called.  Conscious sedation was administered with the use of IV fentanyl  and Versed  under  continuous physician and nurse monitoring.  Heart rate, blood pressure, and oxygen saturation were continuously monitored.  Total sedation time was 42 minutes.  Ultrasound was used to evaluate the left common femoral artery.  It was patent .  A digital ultrasound image was acquired.  A micropuncture needle was used to access the left common femoral artery under ultrasound guidance.  An 018 wire was advanced without resistance and a micropuncture sheath was placed.  The 018 wire was removed and a benson wire was placed.  The micropuncture sheath was exchanged for a 5 french sheath.  An omniflush catheter was advanced over the wire to the level of L-1.  An abdominal angiogram was obtained.  Next, using the omniflush catheter and a benson wire, the aortic bifurcation was crossed and the  catheter was placed into theright external iliac artery and right runoff was obtained.  Findings:  Aortogram: No significant aortic stenosis.  Bilateral common and external iliac arteries are widely patent  Right Lower Extremity: Right common femoral profundofemoral artery widely patent.  The superficial femoral artery is heavily calcified with patent throughout its course.  The popliteal artery is occluded with reconstitution of the below-knee popliteal artery.  The dominant runoff is anterior tibial artery which occludes at the ankle.  There is diffuse disease out onto the foot.  Intervention: After the above images were acquired the decision was made to proceed with intervention.  A 6 French 55 cm Ansell 1 sheath was advanced into the right superficial femoral artery.  I then attempted to cross the popliteal occlusion using a quick cross catheter and a Glidewire and a Glidewire advantage.  Ultimately I was unsuccessful in gaining reentry.  I had created a false lumen and did not feel that I would ever get back in.  Because of the disease on his foot, I did not feel he was a good pedal access.  The groin was then closed with a Celt. Impression:  #1  Right popliteal artery occlusion, unable to be recanalized  #2  Patient will be evaluated for a right femoral to below-knee popliteal artery bypass graft  #3  Dominant runoff is anterior tibial.  This occludes at the ankle.  There is diffuse disease out onto the foot. ALONSO Malvina New, M.D., FACS [facility] Office: [phone] Pager:  [phone]   VAS US  LOWER EXTREMITY SAPHENOUS VEIN MAPPING  Result Date: 02/22/2022 LOWER EXTREMITY VEIN MAPPING Patient Name:  Tyler Mueller  Date of Exam:   02/22/2022 Medical Rec #: [id number]         Accession #:    [id number] Date of Birth: [birth year]         Patient Gender: M Patient Age:   81 years Exam Location:  [facility] Procedure:      VAS US  LOWER EXTREMITY SAPHENOUS VEIN  MAPPING Referring Phys: GAILE NEW --------------------------------------------------------------------------------  Indications:  Pre-op Risk Factors: PAD.  Comparison Study: No prior study Performing Technologist: Rosaline Fujisawa MHA, RDMS, RVT, RDCS  Examination Guidelines: A complete evaluation includes B-mode imaging, spectral Doppler, color Doppler, and power Doppler as needed of all accessible portions of each vessel. Bilateral testing is considered an integral part of a complete examination. Limited examinations for reoccurring indications may be performed as noted. +----------------+-----------+-----------------------+ RT Diameter (cm)RT Findings          GSV           +----------------+-----------+-----------------------+       0.32  Saphenofemoral Junction +----------------+-----------+-----------------------+       0.39                     Proximal thigh      +----------------+-----------+-----------------------+       0.15                        Mid thigh        +----------------+-----------+-----------------------+       0.19                      Distal thigh       +----------------+-----------+-----------------------+       0.20                          Knee           +----------------+-----------+-----------------------+       0.17                        Prox calf        +----------------+-----------+-----------------------+       0.22                        Mid calf         +----------------+-----------+-----------------------+       0.24                       Distal calf       +----------------+-----------+-----------------------+       0.27                          Ankle          +----------------+-----------+-----------------------+ Diagnosing physician: Lonni Blade MD Electronically signed by Lonni Blade MD on 02/22/2022 at 4:19:52 PM.    Final    Medications:  sodium chloride      magnesium  sulfate bolus IVPB       acyclovir   800 mg Oral Daily   amLODipine   5 mg Oral Daily   aspirin  EC  81 mg Oral Daily   azithromycin   250 mg Oral Q M,W,F   calcium  acetate  667 mg Oral BID WC   carvedilol   25 mg Oral 2 times per day on Sun Mon Wed Fri   And   carvedilol   25 mg Oral Once per day on Tue Thu Sat   Chlorhexidine  Gluconate Cloth  6 each Topical Q0600   docusate sodium   100 mg Oral Daily   famotidine   20 mg Oral q AM   ferrous gluconate   324 mg Oral Q breakfast   gabapentin   300 mg Oral QHS   heparin   5,000 Units Subcutaneous Q8H   insulin  aspart  0-6 Units Subcutaneous TID WC   lanthanum   1,500 mg Oral BID WC   midodrine   5 mg Oral Q T,Th,Sa-HD   multivitamin with minerals  1 tablet Oral Daily   pantoprazole   40 mg Oral Daily   potassium chloride  SA  20 mEq Oral Daily   pravastatin   20 mg Oral QPM   predniSONE   5 mg Oral Daily   sulfamethoxazole -trimethoprim   1 tablet Oral QPM   tacrolimus   0.5 mg Oral BID     OP HD: south TTS  3.5h  350/600 52 kg   2/2 bath  LFA AVF   -  No Heparin  -Venofer 100 mg IV  X 5 doses (3/5 doses given)  -Hectorol  3 mcg IV TIW -Mircera 100 mcg IV q 2 weeks (Last dose 02/17/2022)     Assessment/ Plan: R 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 Harden. Per primary/Ortho.  PAD-S/P Right femoral to below knee popliteal bypass grafting with PTFE per Dr. Serene 02/23/2022. Per primary/[facility] ESRD - on HD TTS.  HD today on schedule. No heparin . .  BP/ volume - BP soft on HD today. He is asymptomatic. Running even. No evidence of excess volume by exam. Pre wt 52.2 kg. Refusing midodrine -says he was told at [facility] not to take medication. UF as tolerated.   Anemia ckd - Hb 8.0 this AM. ESA not due until 03/03/2022. May need redosing. Follow HGB. Transfuse as needed.   MBD ckd - Corrected Ca+ 8.3 PO4 at goal. Continue VDRA, calcium  based binders.  Hx lung transplant - on prograf  and prednisone . Follows with Dr. Germaine, [facility] transplant.  DMT2-per primary  Ricka DEL.  Eline Geng NP-C 02/24/2022, 9:22 AM  [employer] [phone]

## 2022-02-24 NOTE — Progress Notes (Signed)
Patient ID: Tyler Mueller, male   DOB: Aug 13, 1941, 81 y.o.   MRN: [id number] ?Patient is postoperative day 1 revascularization to the right lower extremity as well as amputation of the third ray.  Patient had extremely soft nonviable bone.  Kerecis powder was placed in the wound bed and a wound VAC applied.  The wound VAC is a good suction fit.  Discussed with the patient importance of being nonweightbearing on this foot for the first 2 weeks to optimize wound healing.  The wound VAC will stay in place for 1 week.  Discharge planning based on therapy recommendations. ?

## 2022-02-24 NOTE — Progress Notes (Signed)
Report given to Sarah in dialysis.  ?

## 2022-02-24 NOTE — Progress Notes (Addendum)
?  Progress Note ? ? ? ?02/24/2022 ?8:57 AM ?1 Day Post-Op ? ?Subjective:  feels better today ? ?Afebrile ?HR 60's ?56'P-794'I systolic ?016% RA ? ?Vitals:  ? 02/24/22 0749 02/24/22 0830  ?BP: (!) 96/46 (!) 81/38  ?Pulse: 67 62  ?Resp: 14 13  ?Temp:    ?SpO2:    ? ? ?Physical Exam: ?General:  resting comfortably on HD ?Lungs:  non labored ?Incisions:  right groin and right BK incisions look good ?Extremities:  vac with good seal in place right foot.  No doppler available for doppler exam.  Denies pain in right foot. ? ?CBC ?   ?Component Value Date/Time  ? WBC 10.2 02/24/2022 0505  ? RBC 2.73 (L) 02/24/2022 0505  ? HGB 8.0 (L) 02/24/2022 0505  ? HCT 26.6 (L) 02/24/2022 0505  ? PLT 198 02/24/2022 0505  ? MCV 97.4 02/24/2022 0505  ? MCH 29.3 02/24/2022 0505  ? MCHC 30.1 02/24/2022 0505  ? RDW 19.9 (H) 02/24/2022 0505  ? LYMPHSABS 1.1 02/04/2022 1442  ? MONOABS 0.6 02/04/2022 1442  ? EOSABS 0.1 02/04/2022 1442  ? BASOSABS 0.1 02/04/2022 1442  ? ? ?BMET ?   ?Component Value Date/Time  ? NA 131 (L) 02/24/2022 0505  ? K 5.8 (H) 02/24/2022 0505  ? CL 98 02/24/2022 0505  ? CO2 21 (L) 02/24/2022 0505  ? GLUCOSE 260 (H) 02/24/2022 0505  ? GLUCOSE 104 (H) 10/31/2006 0755  ? BUN 47 (H) 02/24/2022 0505  ? CREATININE 6.62 (H) 02/24/2022 0505  ? CALCIUM 7.8 (L) 02/24/2022 0505  ? CALCIUM 8.5 05/09/2011 1339  ? GFRNONAA 8 (L) 02/24/2022 0505  ? GFRAA 6 (L) 07/16/2015 2330  ? ? ?INR ?   ?Component Value Date/Time  ? INR 1.2 02/22/2022 1338  ? ? ? ?Intake/Output Summary (Last 24 hours) at 02/24/2022 0857 ?Last data filed at 02/23/2022 1130 ?Gross per 24 hour  ?Intake 700 ml  ?Output 100 ml  ?Net 600 ml  ? ? ? ?Assessment/Plan:  81 y.o. male is s/p:  ?#1: Right femoral to below-knee popliteal artery bypass graft with 6 mm external ring propatent PTFE ?#2: Right common femoral and profundofemoral endarterectomy  ?1 Day Post-Op ? ? ?-pt seen on HD.  He is feeling fine.  His right groin is feeling better but still sore.   ?-vac in place  right foot.  Darco shoe for ambulation heel weight bearing only.  This has been ordered.  ?-DVT prophylaxis:  sq heparin ? ? ? ?Leontine Locket, PA-C ?[facility] ?[phone] ?02/24/2022 ?8:57 AM ? ? ?I agree with the above.  Stable POD#1 from right femoral popliteal bypass and toe amputation.  Will add plavix to meds.  Continue statin ? ?WElls Shontia Gillooly ?

## 2022-02-24 NOTE — Progress Notes (Signed)
PHARMACIST LIPID MONITORING ? ? ?Tyler Mueller is a 81 y.o. male admitted on 02/23/2022 with right femoral popliteal bypass and toe amputation.  Pharmacy has been consulted to optimize lipid-lowering therapy with the indication of secondary prevention for clinical ASCVD. ? ?Recent Labs: ? ?Lipid Panel (last 6 months):   ?Lab Results  ?Component Value Date  ? CHOL 114 02/24/2022  ? TRIG 50 02/24/2022  ? HDL 46 02/24/2022  ? CHOLHDL 2.5 02/24/2022  ? VLDL 10 02/24/2022  ? [place] 58 02/24/2022  ? ? ?Hepatic function panel (last 6 months):   ?Lab Results  ?Component Value Date  ? AST 21 02/04/2022  ? ALT 12 02/04/2022  ? ALKPHOS 86 02/04/2022  ? BILITOT 0.5 02/04/2022  ? ? ?SCr (since admission):   ?Serum creatinine: 6.62 mg/dL (H) 02/24/22 0505 ?Estimated creatinine clearance: 7.6 mL/min (A) ? ?Current therapy and lipid therapy tolerance ?Current lipid-lowering therapy: Pravastatin 20 mg ?Previous lipid-lowering therapies (if applicable):  ?Documented or reported allergies or intolerances to lipid-lowering therapies (if applicable):  ? ?Assessment:   ?Although no specific drug interaction with transplants meds, I'm hesitant to switch to high-intensity statin at this point.  Pt is also on HD, which would limit his dose to Crestor 10 mg.  LDL below goal of 70 (=58). ? ?Plan:   ? ?1.Statin intensity I do not feel strongly that his statin therapy needs to be intensified.  If MD desires, would recommend switch to Crestor with max of 10 mg. ? ?2.Add ezetimibe (if any one of the following):   Not indicated at this time. ? ?3.Refer to lipid clinic:   No ? ?4.Follow-up with:  Primary care provider - Mikey College, MD ? ?5.Follow-up labs after discharge:  No changes in lipid therapy, repeat a lipid panel in one year.    ? ? ?[place] Crane, Pharm D, BCPS, BCCP ?Clinical Pharmacist ? 02/24/2022 2:40 PM  ? ?[facility] pharmacy phone numbers are listed on [email or web address] ? ? ?

## 2022-02-24 NOTE — Progress Notes (Signed)
OT Cancellation Note ? ?Patient Details ?Name: Tyler Mueller ?MRN: [id number] ?DOB: 12-23-1940 ? ? ?Cancelled Treatment:    Reason Eval/Treat Not Completed: Patient at procedure or test/ unavailable (HD) OT will continue to follow for evaluation as schedule allows ? ?[name] ?02/24/2022, 8:18 AM ? ?Jesse Sans OTR/L ?Acute Rehabilitation Services ?Pager: [phone] ?Office: [phone] ? ?

## 2022-02-24 NOTE — Assessment & Plan Note (Addendum)
Bronchodilators, I-S/flutter valve.  He is status post bilateral lung transplant on chronic immunosuppressant follows at [facility]. ?Tacrolimus levels - WNL ?

## 2022-02-24 NOTE — Assessment & Plan Note (Signed)
Performed by vascular on 3/22 ?

## 2022-02-24 NOTE — Evaluation (Signed)
Physical Therapy Evaluation ?Patient Details ?Name: Tyler Mueller ?MRN: [id number] ?DOB: [birth year] ?Today's Date: 02/24/2022 ? ?History of Present Illness ? 81 y/o male who was admitted 02/23/22 with gangrene on R 3rd toe. S/p abdominal aortogram w/ RLE and peripheral vascular balloon angioplasty (right) on 02/22/22. S/p Right femoral to below-knee popliteal artery bypass graft and Right common femoral and profundofemoral endarterectomy as well as amputation of the third ray with wound vac on 02/23/22. PMH includes Diabetes mellitus without complication, Diverticulitis, ESRD on hemodialysis (T, Th, Sat), History of lung transplant (04/21/2014; 12/05/2002), HTN, and Skin cancer of face, Cataract extraction; Appendectomy. Recently admitted in December 2022 due to flu, frequent falls. ?  ?Clinical Impression ? Pt presents with condition above and deficits mentioned below, see PT Problem List. PTA, he was mod I using a cane for functional mobility, living with his wife in a 1-level house with 2 STE. Pt endorses a hx of falls in which his legs would give out. Currently, pt displays some mild deficits in balance, activity tolerance, and lower extremity strength that do place him at risk for falls. However, pt was able to ambulate up to ~40 ft with a RW and min guard assist without LOB today, needing cues to maintain WB through his R heel only. Pt would benefit from follow-up with HHPT to improve his safety with mobility and thus reduce his risk for falls. Will continue to follow acutely.   ?   ? ?Recommendations for follow up therapy are one component of a multi-disciplinary discharge planning process, led by the attending physician.  Recommendations may be updated based on patient status, additional functional criteria and insurance authorization. ? ?Follow Up Recommendations Home health PT ? ?  ?Assistance Recommended at Discharge Intermittent Supervision/Assistance  ?Patient can return home with the following ? A little  help with bathing/dressing/bathroom;Assistance with cooking/housework;Assist for transportation;Help with stairs or ramp for entrance ? ?  ?Equipment Recommendations None recommended by PT  ?Recommendations for Other Services ?    ?  ?Functional Status Assessment Patient has had a recent decline in their functional status and demonstrates the ability to make significant improvements in function in a reasonable and predictable amount of time.  ? ?  ?Precautions / Restrictions Precautions ?Precautions: Fall ?Precaution Comments: wound vac R foot ?Required Braces or Orthoses: Other Brace ?Other Brace: awaiting Darco shoe ?Restrictions ?Weight Bearing Restrictions: Yes ?RLE Weight Bearing: Partial weight bearing ?RLE Partial Weight Bearing Percentage or Pounds: Partial WB - Heel only (confirmed with MD 3/23) ?Other Position/Activity Restrictions: wound vac RLE, awaiting darco shoe  ? ?  ? ?Mobility ? Bed Mobility ?Overal bed mobility: Needs Assistance ?Bed Mobility: Supine to Sit ?  ?  ?Supine to sit: HOB elevated, Min assist ?  ?  ?General bed mobility comments: Min A for assisting RLE and trunk to sit R EOB ?  ? ?Transfers ?Overall transfer level: Needs assistance ?Equipment used: Rolling walker (2 wheels) ?Transfers: Sit to/from Stand, Bed to chair/wheelchair/BSC ?Sit to Stand: Min guard ?  ?Step pivot transfers: Min guard ?  ?  ?  ?General transfer comment: Min guard A for safety, no LOB. Cues for positioning R foot to ensure WB only through heel, good compliance. ?  ? ?Ambulation/Gait ?Ambulation/Gait assistance: Min guard ?Gait Distance (Feet): 40 Feet (x2 bouts of ~3 ft > ~ 40 ft) ?Assistive device: Rolling walker (2 wheels) ?Gait Pattern/deviations: Step-to pattern, Decreased step length - left, Decreased stance time - right, Decreased dorsiflexion - right, Decreased weight  shift to right, Antalgic, Trunk flexed ?Gait velocity: reduced ?Gait velocity interpretation: <1.31 ft/sec, indicative of household  ambulator ?  ?General Gait Details: Pt with slow, antalgic gait pattern, needing repeated cues to ensure toes stay off ground to follow WB precautions. No LOB, min guard for safety. ? ?Stairs ?  ?  ?  ?  ?  ? ?Wheelchair Mobility ?  ? ?Modified Rankin (Stroke Patients Only) ?  ? ?  ? ?Balance Overall balance assessment: Needs assistance ?Sitting-balance support: No upper extremity supported, Feet supported ?Sitting balance-Leahy Scale: Fair ?  ?  ?Standing balance support: Bilateral upper extremity supported, During functional activity ?Standing balance-Leahy Scale: Poor ?Standing balance comment: Reliant on RW ?  ?  ?  ?  ?  ?  ?  ?  ?  ?  ?  ?   ? ? ? ?Pertinent Vitals/Pain Pain Assessment ?Pain Assessment: 0-10 ?Pain Score: 7  ?Pain Location: R foot and leg ?Pain Descriptors / Indicators: Discomfort, Grimacing, Operative site guarding ?Pain Intervention(s): Limited activity within patient's tolerance, Monitored during session, Repositioned, Patient requesting pain meds-RN notified  ? ? ?Home Living Family/patient expects to be discharged to:: Private residence ?Living Arrangements: Spouse/significant other ?Available Help at Discharge: Family;Available 24 hours/day ?Type of Home: House ?Home Access: Stairs to enter ?Entrance Stairs-Rails: Right ?Entrance Stairs-Number of Steps: 2 ?  ?Home Layout: One level ?Home Equipment: Rollator (4 wheels);Cane - single Barista (2 wheels);Shower seat;Hand held shower head;Grab bars - tub/shower ?   ?  ?Prior Function Prior Level of Function : Independent/Modified Independent;Driving;History of Falls (last six months) ?  ?  ?  ?  ?  ?  ?Mobility Comments: Uses a cane for mobility. Hx of falls in which legs give out from under him ?ADLs Comments: Independent with bathing and dressing.  Patient still drives.  Helps a little with meals and home management. ?  ? ? ?Hand Dominance  ? Dominant Hand: Right ? ?  ?Extremity/Trunk Assessment  ? Upper Extremity  Assessment ?Upper Extremity Assessment: Defer to OT evaluation ?  ? ?Lower Extremity Assessment ?Lower Extremity Assessment: RLE deficits/detail;Generalized weakness ?RLE Deficits / Details: denies numbness or asymmetry in sensation to touch between bil feet; wound vac and bandaging covering foot ?  ? ?Cervical / Trunk Assessment ?Cervical / Trunk Assessment: Kyphotic  ?Communication  ? Communication: No difficulties  ?Cognition Arousal/Alertness: Awake/alert ?Behavior During Therapy: [facility] for tasks assessed/performed ?Overall Cognitive Status: Within Functional Limits for tasks assessed ?  ?  ?  ?  ?  ?  ?  ?  ?  ?  ?  ?  ?  ?  ?  ?  ?General Comments: Feel pt is at baseline. Slight decreased in deficit awareness ?  ?  ? ?  ?General Comments General comments (skin integrity, edema, etc.): Wife present throughout ? ?  ?Exercises    ? ?Assessment/Plan  ?  ?PT Assessment Patient needs continued PT services  ?PT Problem List Decreased strength;Decreased range of motion;Decreased balance;Decreased activity tolerance;Decreased mobility;Decreased knowledge of precautions;Decreased skin integrity;Pain ? ?   ?  ?PT Treatment Interventions DME instruction;Gait training;Stair training;Functional mobility training;Therapeutic activities;Therapeutic exercise;Balance training;Neuromuscular re-education;Patient/family education   ? ?PT Goals (Current goals can be found in the Care Plan section)  ?Acute Rehab PT Goals ?Patient Stated Goal: to get better ?PT Goal Formulation: With patient/family ?Time For Goal Achievement: 03/10/22 ?Potential to Achieve Goals: Good ? ?  ?Frequency Min 3X/week ?  ? ? ?Co-evaluation PT/OT/SLP Co-Evaluation/Treatment: Yes ?Reason  for Co-Treatment: For patient/therapist safety;To address functional/ADL transfers ?PT goals addressed during session: Mobility/safety with mobility;Balance;Proper use of DME ?  ?  ? ? ?  ?AM-PAC PT "6 Clicks" Mobility  ?Outcome Measure Help needed turning from your back to  your side while in a flat bed without using bedrails?: A Little ?Help needed moving from lying on your back to sitting on the side of a flat bed without using bedrails?: A Little ?Help needed moving to and from a bed to a ch

## 2022-02-25 ENCOUNTER — Encounter (HOSPITAL_COMMUNITY): Payer: Self-pay | Admitting: Surgery

## 2022-02-25 DIAGNOSIS — I739 Peripheral vascular disease, unspecified: Secondary | ICD-10-CM | POA: Diagnosis not present

## 2022-02-25 DIAGNOSIS — Z992 Dependence on renal dialysis: Secondary | ICD-10-CM | POA: Diagnosis not present

## 2022-02-25 DIAGNOSIS — M869 Osteomyelitis, unspecified: Secondary | ICD-10-CM | POA: Diagnosis not present

## 2022-02-25 DIAGNOSIS — N186 End stage renal disease: Secondary | ICD-10-CM | POA: Diagnosis not present

## 2022-02-25 LAB — GLUCOSE, CAPILLARY
Glucose-Capillary: 143 mg/dL — ABNORMAL HIGH (ref 70–99)
Glucose-Capillary: 85 mg/dL (ref 70–99)
Glucose-Capillary: 169 mg/dL — ABNORMAL HIGH (ref 70–99)
Glucose-Capillary: 60 mg/dL — ABNORMAL LOW (ref 70–99)
Glucose-Capillary: 59 mg/dL — ABNORMAL LOW (ref 70–99)
Glucose-Capillary: 73 mg/dL (ref 70–99)

## 2022-02-25 LAB — RENAL FUNCTION PANEL
Albumin: 2.5 g/dL — ABNORMAL LOW (ref 3.5–5.0)
Anion gap: 7 (ref 5–15)
BUN: 20 mg/dL (ref 8–23)
CO2: 27 mmol/L (ref 22–32)
Calcium: 7.5 mg/dL — ABNORMAL LOW (ref 8.9–10.3)
Chloride: 101 mmol/L (ref 98–111)
Creatinine, Ser: 3.45 mg/dL — ABNORMAL HIGH (ref 0.61–1.24)
GFR, Estimated: 17 mL/min — ABNORMAL LOW (ref >60)
Glucose, Bld: 170 mg/dL — ABNORMAL HIGH (ref 70–99)
Phosphorus: 3.4 mg/dL (ref 2.5–4.6)
Potassium: 4.5 mmol/L (ref 3.5–5.1)
Sodium: 135 mmol/L (ref 135–145)

## 2022-02-25 MED ORDER — MUPIROCIN 2 % EX OINT
TOPICAL_OINTMENT | Freq: Two times a day (BID) | CUTANEOUS | Status: DC
  Filled 2022-02-25: qty 22

## 2022-02-25 MED ORDER — INSULIN ASPART 100 UNIT/ML IJ SOLN
3.0000 [IU] | Freq: Three times a day (TID) | INTRAMUSCULAR | Status: DC
Start: 2022-02-25 — End: 2022-02-26
  Administered 2022-02-25 (×2): 3 [IU] via SUBCUTANEOUS

## 2022-02-25 MED ORDER — PROSOURCE PLUS PO LIQD
30.0000 mL | Freq: Two times a day (BID) | ORAL | Status: DC
  Administered 2022-02-25 – 2022-03-01 (×8): 30 mL via ORAL
  Filled 2022-02-25 (×6): qty 30

## 2022-02-25 NOTE — Progress Notes (Signed)
Physical Therapy Treatment ?Patient Details ?Name: Tyler Mueller ?MRN: [id number] ?DOB: Apr 05, 1941 ?Today's Date: 02/25/2022 ? ? ?History of Present Illness 81 y/o male who was admitted 02/23/22 with gangrene on R 3rd toe. S/p abdominal aortogram w/ RLE and peripheral vascular balloon angioplasty (right) on 02/22/22. S/p Right femoral to below-knee popliteal artery bypass graft and Right common femoral and profundofemoral endarterectomy as well as amputation of the third ray with wound vac on 02/23/22. PMH includes Diabetes mellitus without complication, Diverticulitis, ESRD on hemodialysis (T, Th, Sat), History of lung transplant (04/21/2014; 12/05/2002), HTN, and Skin cancer of face, Cataract extraction; Appendectomy. Recently admitted in December 2022 due to flu, frequent falls. ? ?  ?PT Comments  ? ? Pt received in supine, agreeable to therapy session with moderate encouragement, emphasis on stair training/sequencing. Pt defers gait trial, able to perform 4 steps with min guard, BUE support and cues for safety. Plan to bring rollator next session to ensure pt safely using device per his plan for home, pt unsafe using cane only at this time due to step-to weightbearing restriction. Pt continues to benefit from PT services to progress toward functional mobility goals.    ?Recommendations for follow up therapy are one component of a multi-disciplinary discharge planning process, led by the attending physician.  Recommendations may be updated based on patient status, additional functional criteria and insurance authorization. ? ?Follow Up Recommendations ? Home health PT ?  ?  ?Assistance Recommended at Discharge Intermittent Supervision/Assistance  ?Patient can return home with the following A little help with bathing/dressing/bathroom;Assistance with cooking/housework;Assist for transportation;Help with stairs or ramp for entrance;A little help with walking and/or transfers ?  ?Equipment Recommendations ? None  recommended by PT (pt has rollator)  ?  ?Recommendations for Other Services   ? ? ?  ?Precautions / Restrictions Precautions ?Precautions: Fall ?Precaution Comments: wound vac R foot ?Required Braces or Orthoses: Other Brace ?Other Brace: Darco shoe in room ?Restrictions ?Weight Bearing Restrictions: Yes ?RLE Weight Bearing: Partial weight bearing ?RLE Partial Weight Bearing Percentage or Pounds: Partial WB - Heel only (confirmed with Sharol Given MD 3/23) ?Other Position/Activity Restrictions: wound vac RLE, awaiting darco shoe  ?  ? ?Mobility ? Bed Mobility ?Overal bed mobility: Needs Assistance ?Bed Mobility: Supine to Sit, Sit to Supine ?  ?  ?Supine to sit: Min guard, HOB elevated ?Sit to supine: Min guard, HOB elevated ?  ?General bed mobility comments: good initiation, use of bed features, no rails ?  ? ?Transfers ?Overall transfer level: Needs assistance ?Equipment used: Straight cane ?Transfers: Sit to/from Stand, Bed to chair/wheelchair/BSC ?Sit to Stand: Min assist ?  ?Step pivot transfers: Min guard ?  ?  ?  ?General transfer comment: Min guard A for safety, no LOB. Pt compliant with R heel NWB but refusing to use RW, minA for stand>sit safety ?  ? ?Ambulation/Gait ?Ambulation/Gait assistance: Min guard ?Gait Distance (Feet): 5 Feet ?Assistive device: Straight cane ?Gait Pattern/deviations: Step-to pattern, Decreased step length - left, Decreased stance time - right, Decreased weight shift to right, Antalgic, Trunk flexed ?Gait velocity: reduced ?  ?  ?General Gait Details: Darco shoe donned, cues for heel WB pattern; pt refusing RW for small steps to/from platform step in room, utilizing cane and countertop for balance. No LOB, min guard for safety. Pt refusing ambulation in room or hallway due to fatigue. He reports he will use rollator at home. ? ? ?Stairs ?Stairs: Yes ?Stairs assistance: Min guard ?Stair Management: One rail Left, With cane,  Forwards, Step to pattern ?Number of Stairs: 4 (7" platform step x4  reps) ?General stair comments: cues for safety/sequencing, pt without LOB, good carryover of initial sequencing instructions ? ? ?Wheelchair Mobility ?  ? ?Modified Rankin (Stroke Patients Only) ?  ? ? ?  ?Balance Overall balance assessment: Needs assistance ?Sitting-balance support: No upper extremity supported, Feet supported ?Sitting balance-Leahy Scale: Fair ?  ?  ?Standing balance support: Bilateral upper extremity supported, During functional activity ?Standing balance-Leahy Scale: Poor ?Standing balance comment: Reliant on BUE support ?  ?  ?  ?  ?  ?  ?  ?  ?  ?  ?  ?  ? ?  ?Cognition Arousal/Alertness: Awake/alert ?Behavior During Therapy: [facility] for tasks assessed/performed ?Overall Cognitive Status: Within Functional Limits for tasks assessed ?  ?  ?  ?  ?  ?  ?  ?  ?  ?  ?  ?  ?  ?  ?  ?  ?General Comments: Pt self directed and decreased safety awareness. Pt not willing to use RW to step up to platform step in room, preferring to use cane to get closer to step but still using furniture in room with opposite arm for stability ?  ?  ? ?  ?Exercises Other Exercises ?Other Exercises: supine RLE AROM: ankle pumps, reviewed quad sets, pt defer to perform any other HEP ? ?  ?General Comments General comments (skin integrity, edema, etc.): spouse left room prior to mobility ?  ?  ? ?Pertinent Vitals/Pain Pain Assessment ?Pain Assessment: 0-10 ?Pain Score: 2  ?Pain Location: R foot and leg ?Pain Descriptors / Indicators: Operative site guarding ?Pain Intervention(s): Monitored during session, Repositioned, Premedicated before session, Other (comment) (RLE elevated on yellow bone foam once back in supine)  ? ? ?Home Living   ?  ?  ?  ?  ?  ?  ?  ?  ?  ?   ?  ?Prior Function    ?  ?  ?   ? ?PT Goals (current goals can now be found in the care plan section) Acute Rehab PT Goals ?Patient Stated Goal: to get better and go home ?PT Goal Formulation: With patient/family ?Time For Goal Achievement: 03/10/22 ?Progress  towards PT goals: Progressing toward goals ? ?  ?Frequency ? ? ? Min 3X/week ? ? ? ?  ?PT Plan Current plan remains appropriate  ? ? ?   ?AM-PAC PT "6 Clicks" Mobility   ?Outcome Measure ? Help needed turning from your back to your side while in a flat bed without using bedrails?: A Little ?Help needed moving from lying on your back to sitting on the side of a flat bed without using bedrails?: A Little ?Help needed moving to and from a bed to a chair (including a wheelchair)?: A Little ?Help needed standing up from a chair using your arms (e.g., wheelchair or bedside chair)?: A Little ?Help needed to walk in hospital room?: A Lot (mod safety cues) ?Help needed climbing 3-5 steps with a railing? : A Little ?6 Click Score: 17 ? ?  ?End of Session Equipment Utilized During Treatment: Gait belt ?Activity Tolerance: Patient limited by fatigue;Patient limited by pain ?Patient left: with call bell/phone within reach;in bed;with bed alarm set;Other (comment) (RLE elevated, pt given warm blankets x2) ?Nurse Communication: Mobility status ?PT Visit Diagnosis: Unsteadiness on feet (R26.81);Other abnormalities of gait and mobility (R26.89);Muscle weakness (generalized) (M62.81);History of falling (Z91.81);Difficulty in walking, not elsewhere classified (R26.2);Pain ?Pain -  Right/Left: Right ?Pain - part of body: Ankle and joints of foot ?  ? ? ?Time: 3474-2595 ?PT Time Calculation (min) (ACUTE ONLY): 22 min ? ?Charges:  $Gait Training: 8-22 mins          ?          ? ?Pegah Segel P., PTA ?Acute Rehabilitation Services ?Pager: [phone] ?Office: [phone]  ? ? ?Kara Pacer Jaivion Kingsley ?02/25/2022, 6:11 PM ? ?

## 2022-02-25 NOTE — Progress Notes (Addendum)
[facility] ? ?Subjective  - Still having pain that requires PO q 4 medication  ? ? ?Objective ?(!) 136/48 ?78 ?98.5 ?F (36.9 ?C) (Oral) ?19 ?98% ? ?Intake/Output Summary (Last 24 hours) at 02/25/2022 3419 ?Last data filed at 02/24/2022 2121 ?Gross per 24 hour  ?Intake 150 ml  ?Output 1 ml  ?Net 149 ml  ? ? ?Right foot dressing with vac to suction no drainage ?Doppler signals AT/PT intact at ankle ?Right groin soft without hematoma and healing well ?Lungs non labored breathing ? ?Assessment/Planning: ?POD # 2 ? 81 y.o. male is s/p:  ?#1: Right femoral to below-knee popliteal artery bypass graft with 6 mm external ring propatent PTFE ?#2: Right common femoral and profundofemoral endarterectomy  ? ?Good inflow with patent bypass via doppler AT/PT ?Incisions healing well ?Stable disposition ?Mobility encouraged ?PT/OT is working with patient ?Heparin SQ for DVT prophylaxis ? ? ?Roxy Horseman ?02/25/2022 ?7:12 AM ?-- ? ?Laboratory ?Lab Results: ?Recent Labs  ?  02/23/22 ?1627 02/24/22 ?0505  ?WBC 9.9 10.2  ?HGB 9.3* 8.0*  ?HCT 29.7* 26.6*  ?PLT 214 198  ? ?BMET ?Recent Labs  ?  02/24/22 ?0505 02/25/22 ?0107  ?NA 131* 135  ?K 5.8* 4.5  ?CL 98 101  ?CO2 21* 27  ?GLUCOSE 260* 170*  ?BUN 47* 20  ?CREATININE 6.62* 3.45*  ?CALCIUM 7.8* 7.5*  ? ? ?COAG ?Lab Results  ?Component Value Date  ? INR 1.2 02/22/2022  ? INR 1.4 (H) 01/19/2011  ? INR 2.57 (H) 01/13/2011  ? ?No results found for: PTT ? ?VASCULAR STAFF ADDENDUM: ?I have independently interviewed and examined the patient. ?I agree with the above.  ? ?Yevonne Aline. Stanford Breed, MD ?[facility] ?Office Phone Number: [phone] ?02/25/2022 11:52 AM ? ? ? ?

## 2022-02-25 NOTE — Progress Notes (Signed)
Hypoglycemic Event ? ?CBG: 60, then 59 on first recheck ? ?Treatment: personal cookkie, 4oz juice ? ?Symptoms: none ? ?Follow-up CBG: Time: 2208 CBG Result: 73 ? ?Possible Reasons for Event: given 6u at dinnertime ? ?Comments/MD notified: n/a ? ? ? ?Jaymes Graff ? ? ?

## 2022-02-25 NOTE — Progress Notes (Signed)
?[facility] ?Progress Note  ? ?Subjective:  Seen in room - denies CP or dyspnea. Noted to have bleeding around R arm IV site - RN informed. No fluid removal with HD yesterday d/t hypotension - BP better today. ? ?Objective ?Vitals:  ? 02/24/22 1935 02/25/22 0017 02/25/22 5643 02/25/22 3295  ?BP: (!) 120/52 (!) 121/46 (!) 136/48 (!) 119/50  ?Pulse: 90 81 78 77  ?Resp: (!) '21 15 19 18  '$ ?Temp: 98.3 ?F (36.8 ?C) 98.3 ?F (36.8 ?C) 98.5 ?F (36.9 ?C) 98 ?F (36.7 ?C)  ?TempSrc: Oral Oral Oral Oral  ?SpO2: 100% 98% 98% 99%  ?Weight:      ?Height:      ? ?Physical Exam ?General: Elderly man, NAD. Room air. ?Heart: RRR; 3/6 murmur ?Lungs: CTAB ?Abdomen: soft ?Extremities: R foot/leg bandaged, no LLE edema ?Dialysis Access: L forearm AVF + bruit ? ?Additional Objective ?Labs: ?Basic Metabolic Panel: ?Recent Labs  ?Lab 02/23/22 ?0622 02/23/22 ?1627 02/24/22 ?0505 02/25/22 ?0107  ?NA 137  --  131* 135  ?K 4.5  --  5.8* 4.5  ?CL 102  --  98 101  ?CO2  --   --  21* 27  ?GLUCOSE 58*  --  260* 170*  ?BUN 29*  --  47* 20  ?CREATININE 5.90* 5.90* 6.62* 3.45*  ?CALCIUM  --   --  7.8* 7.5*  ?PHOS  --   --   --  3.4  ? ?Liver Function Tests: ?Recent Labs  ?Lab 02/25/22 ?0107  ?ALBUMIN 2.5*  ? ?CBC: ?Recent Labs  ?Lab 02/23/22 ?0622 02/23/22 ?1627 02/24/22 ?0505  ?WBC  --  9.9 10.2  ?HGB 11.2* 9.3* 8.0*  ?HCT 33.0* 29.7* 26.6*  ?MCV  --  96.7 97.4  ?PLT  --  214 198  ? ?Medications: ? sodium chloride    ? magnesium sulfate bolus IVPB    ? ? acyclovir  800 mg Oral Daily  ? amLODipine  5 mg Oral Daily  ? aspirin EC  81 mg Oral Daily  ? azithromycin  250 mg Oral Q M,W,F  ? calcium acetate  667 mg Oral BID WC  ? carvedilol  25 mg Oral 2 times per day on Sun Mon Wed Fri  ? And  ? carvedilol  25 mg Oral Once per day on Tue Thu Sat  ? Chlorhexidine Gluconate Cloth  6 each Topical Q0600  ? clopidogrel  75 mg Oral Daily  ? docusate sodium  100 mg Oral Daily  ? doxercalciferol  3 mcg Intravenous Q T,Th,Sa-HD  ? famotidine  20 mg  Oral q AM  ? ferrous gluconate  324 mg Oral Q breakfast  ? gabapentin  300 mg Oral QHS  ? heparin  5,000 Units Subcutaneous Q8H  ? insulin aspart  0-15 Units Subcutaneous TID WC  ? insulin aspart  0-5 Units Subcutaneous QHS  ? insulin aspart  3 Units Subcutaneous TID WC  ? lanthanum  1,500 mg Oral BID WC  ? midodrine  5 mg Oral Q T,Th,Sa-HD  ? multivitamin with minerals  1 tablet Oral Daily  ? mupirocin ointment   Nasal BID  ? pantoprazole  40 mg Oral Daily  ? pravastatin  20 mg Oral QPM  ? predniSONE  5 mg Oral Daily  ? sulfamethoxazole-trimethoprim  1 tablet Oral QPM  ? tacrolimus  0.5 mg Oral BID  ? ? ?Dialysis Orders: ?[facility] ?3.5h  350/600 52 kg   2/2 bath  LFA AVF No Heparin ?- Venofer  100 mg IV  X 5 doses (3/5 doses given)  ?- Hectorol 3 mcg IV TIW ?- Mircera 100 mcg IV q 2 weeks (Last dose 02/17/2022) ?  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 Sharol Given: Per primary/Ortho.  ?PAD s/p Right femoral to below knee popliteal bypass grafting with PTFE per Dr. Trula Slade 02/23/2022. Per primary/[facility] ?ESRD: Continue HD on TTS schedule, no heparin. Next HD tomorrow. ?BP/ volume: BP low yesterday, better today. Refusing midodrine - says he was told at [facility] not to take medication. UF as tolerated.   ?Anemia of ESRD: Hgb down to 8, ESA due 03/03/22. Follow HGB. Transfuse as needed.   ?Secondary hyperparathyroidism: CorrCa/Phos ok. Continue VDRA, calcium based binders.  ?Hx lung transplant: On prograf and prednisone. Follows with Dr. Doy Mince, [facility] transplant.  ?DMT2 ? ?Veneta Penton, PA-C ?02/25/2022, 9:57 AM  ?[facility] ? ? ? ?

## 2022-02-25 NOTE — Progress Notes (Signed)
Pt receives out-pt HD at [facility] on TTS. Pt arrives at 5:10 for 5:25 chair time. Will assist as needed.  ? ?Melven Sartorius ?Renal Navigator ?[phone] ?

## 2022-02-25 NOTE — Progress Notes (Signed)
Orthopedic Tech Progress Note ?Patient Details:  ?Tyler Mueller ?1941/06/29 ?[id number] ? ?Ortho Devices ?Type of Ortho Device: Darco shoe ?Ortho Device/Splint Location: RLE ?Ortho Device/Splint Interventions: Ordered ?  ?  ? ?[name] A [name] ?02/25/2022, 2:59 PM ? ?

## 2022-02-25 NOTE — Progress Notes (Signed)
?PROGRESS NOTE ? ? ? ?Tyler Mueller  ZES:[id number] DOB: [birth year] DOA: 02/23/2022 ?PCP: Mikey College, MD  ? ?Brief Narrative:  ?81 year old with history of ESRD on hemodialysis, lung transplant in 2004 on immunosuppressants, HTN, bronchiectasis, DM2 presents to the hospital with complaints of diabetic foot ulcer of the right toe.  He underwent revascularization and amputation with wound VAC placement on 02/23/2022 by vascular and orthopedic.  Medical team consulted to help assist with management. ? ? ?Assessment & Plan: ? Active Problems: ?  PVD (peripheral vascular disease) (HCC) s/p femoral-popliteal bypass surgery ?  BRONCHIECTASIS ?  ESRD on hemodialysis ([place]) ?  S/P femoral-popliteal bypass surgery ?  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  Type 2 diabetes mellitus with peripheral neuropathy (HCC) ?  History of lung transplant ([place]) secondary to bronchiectasis ?  Essential hypertension ?  ? ? ?Assessment and Plan: ?PVD (peripheral vascular disease) ([place]) s/p femoral-popliteal bypass surgery ?Status post bypass and endarterectomy by vascular 3/22 ? 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 ?S/P femoral-popliteal bypass surgery ?Performed by vascular on 3/22 ? ?ESRD on hemodialysis ([place]) ?Hemodialysis TTS, nephrology team following ? ?BRONCHIECTASIS ?Bronchodilators, I-S/flutter valve.  He is status post bilateral lung transplant on chronic immunosuppressant follows at [facility]. ? ?Essential hypertension ?Continue current blood pressure medication regimen. ? ? ? ?History of lung transplant ([place]) secondary to bronchiectasis ?Status post bilateral lung transplant at [facility] in 2004. ?Home medications- Prograf, prednisone, Bactrim ? ? ?Type 2 diabetes mellitus with peripheral neuropathy (HCC) ?Sliding scale and Accu-Chek ?NovoLog 3 units  premeals ?A1c 02/23/2022-5.8 ? ? ? ? ? ?DVT prophylaxis: Subcu heparin ?Code Status: Full code ?Family Communication:   ? ? ?Discharge planning per vascular team ? ? ?Subjective: ?Seen and examined at bedside, no complaints at this time.  Overall doing well. ? ?Examination: ? ?General exam: Appears calm and comfortable  ?Respiratory system: Clear to auscultation. Respiratory effort normal. ?Cardiovascular system: S1 & S2 heard, RRR. No JVD, murmurs, rubs, gallops or clicks. No pedal edema. ?Gastrointestinal system: Abdomen is nondistended, soft and nontender. No organomegaly or masses felt. Normal bowel sounds heard. ?Central nervous system: Alert and oriented. No focal neurological deficits. ?Extremities: Symmetric 5 x 5 power. ?Skin: Amputation site noted with wound VAC in place.  No active evidence of bleeding. ?Psychiatry: Judgement and insight appear normal. Mood & affect appropriate.  ? ? ? ?Objective: ?Vitals:  ? 02/24/22 1642 02/24/22 1935 02/25/22 0017 02/25/22 0415  ?BP: (!) 132/47 (!) 120/52 (!) 121/46 (!) 136/48  ?Pulse: 91 90 81 78  ?Resp: 18 (!) '21 15 19  '$ ?Temp: 97.8 ?F (36.6 ?C) 98.3 ?F (36.8 ?C) 98.3 ?F (36.8 ?C) 98.5 ?F (36.9 ?C)  ?TempSrc: Oral Oral Oral Oral  ?SpO2: 100% 100% 98% 98%  ?Weight:      ?Height:      ? ? ?Intake/Output Summary (Last 24 hours) at 02/25/2022 0746 ?Last data filed at 02/24/2022 2121 ?Gross per 24 hour  ?Intake 150 ml  ?Output 1 ml  ?Net 149 ml  ? ?Filed Weights  ? 02/22/22 1848 02/23/22 0556 02/24/22 0734  ?Weight: 52.2 kg 52.2 kg 58.1 kg  ? ? ? ?Data Reviewed:  ? ?CBC: ?Recent Labs  ?Lab 02/22/22 ?0718 02/23/22 ?0622 02/23/22 ?1627 02/24/22 ?0505  ?WBC  --   --  9.9  10.2  ?HGB 11.9* 11.2* 9.3* 8.0*  ?HCT 35.0* 33.0* 29.7* 26.6*  ?MCV  --   --  96.7 97.4  ?PLT  --   --  214 198  ? ?Basic Metabolic Panel: ?Recent Labs  ?Lab 02/22/22 ?0718 02/23/22 ?0622 02/23/22 ?1627 02/24/22 ?0505 02/25/22 ?0107  ?NA 140 137  --  131* 135  ?K 3.9 4.5  --  5.8* 4.5  ?CL 100 102  --  98 101   ?CO2  --   --   --  21* 27  ?GLUCOSE 130* 58*  --  260* 170*  ?BUN 22 29*  --  47* 20  ?CREATININE 4.40* 5.90* 5.90* 6.62* 3.45*  ?CALCIUM  --   --   --  7.8* 7.5*  ?PHOS  --   --   --   --  3.4  ? ?GFR: ?Estimated Creatinine Clearance: 14.5 mL/min (A) (by C-G formula based on SCr of 3.45 mg/dL (H)). ?Liver Function Tests: ?Recent Labs  ?Lab 02/25/22 ?0107  ?ALBUMIN 2.5*  ? ?No results for input(s): LIPASE, AMYLASE in the last 168 hours. ?No results for input(s): AMMONIA in the last 168 hours. ?Coagulation Profile: ?Recent Labs  ?Lab 02/22/22 ?1338  ?INR 1.2  ? ?Cardiac Enzymes: ?No results for input(s): CKTOTAL, CKMB, CKMBINDEX, TROPONINI in the last 168 hours. ?BNP (last 3 results) ?No results for input(s): PROBNP in the last 8760 hours. ?HbA1C: ?Recent Labs  ?  02/23/22 ?1627  ?HGBA1C 5.8*  ? ?CBG: ?Recent Labs  ?Lab 02/24/22 ?0093 02/24/22 ?1216 02/24/22 ?1637 02/24/22 ?2132 02/25/22 ?8182  ?GLUCAP 226* 143* 270* 266* 143*  ? ?Lipid Profile: ?Recent Labs  ?  02/24/22 ?0505  ?CHOL 114  ?HDL 46  ?[place] 58  ?TRIG 50  ?CHOLHDL 2.5  ? ?Thyroid Function Tests: ?No results for input(s): TSH, T4TOTAL, FREET4, T3FREE, THYROIDAB in the last 72 hours. ?Anemia Panel: ?No results for input(s): VITAMINB12, FOLATE, FERRITIN, TIBC, IRON, RETICCTPCT in the last 72 hours. ?Sepsis Labs: ?No results for input(s): PROCALCITON, LATICACIDVEN in the last 168 hours. ? ?Recent Results (from the past 240 hour(s))  ?Surgical PCR Screen     Status: Abnormal  ? Collection Time: 02/22/22  1:18 PM  ? Specimen: Nasal Mucosa; Nasal Swab  ?Result Value Ref Range Status  ? MRSA, PCR NEGATIVE NEGATIVE Final  ? Staphylococcus aureus POSITIVE (A) NEGATIVE Final  ?  Comment: (NOTE) ?The Xpert SA Assay (FDA approved for NASAL specimens in patients 34 ?years of age and older), is one component of a comprehensive ?surveillance program. It is not intended to diagnose infection nor to ?guide or monitor treatment. ?Performed at [facility],  [place] [address] [place], [place] ?[address] ?  ?  ? ? ? ? ? ?Radiology Studies: ?No results found. ? ? ? ? ? ?Scheduled Meds: ? acyclovir  800 mg Oral Daily  ? amLODipine  5 mg Oral Daily  ? aspirin EC  81 mg Oral Daily  ? azithromycin  250 mg Oral Q M,W,F  ? calcium acetate  667 mg Oral BID WC  ? carvedilol  25 mg Oral 2 times per day on Sun Mon Wed Fri  ? And  ? carvedilol  25 mg Oral Once per day on Tue Thu Sat  ? Chlorhexidine Gluconate Cloth  6 each Topical Q0600  ? clopidogrel  75 mg Oral Daily  ? docusate sodium  100 mg Oral Daily  ? doxercalciferol  3 mcg Intravenous Q T,Th,Sa-HD  ? famotidine  20 mg Oral q AM  ? ferrous gluconate  324 mg Oral Q breakfast  ? gabapentin  300 mg Oral QHS  ? heparin  5,000 Units Subcutaneous Q8H  ? insulin aspart  0-15 Units Subcutaneous TID WC  ? insulin aspart  0-5 Units Subcutaneous QHS  ? insulin aspart  3 Units Subcutaneous TID WC  ? lanthanum  1,500 mg Oral BID WC  ? midodrine  5 mg Oral Q T,Th,Sa-HD  ? multivitamin with minerals  1 tablet Oral Daily  ? mupirocin ointment   Nasal BID  ? pantoprazole  40 mg Oral Daily  ? pravastatin  20 mg Oral QPM  ? predniSONE  5 mg Oral Daily  ? sulfamethoxazole-trimethoprim  1 tablet Oral QPM  ? tacrolimus  0.5 mg Oral BID  ? ?Continuous Infusions: ? sodium chloride    ? magnesium sulfate bolus IVPB    ? ? ? LOS: 2 days  ? ?Time spent= 35 mins ? ? ? ?Arvin Abello Arsenio Loader, MD ?[facility] ? ?If 7PM-7AM, please contact night-coverage ? ?02/25/2022, 7:46 AM  ? ?

## 2022-02-26 ENCOUNTER — Inpatient Hospital Stay (HOSPITAL_COMMUNITY): Payer: Medicare Other

## 2022-02-26 DIAGNOSIS — N186 End stage renal disease: Secondary | ICD-10-CM | POA: Diagnosis not present

## 2022-02-26 DIAGNOSIS — R6889 Other general symptoms and signs: Secondary | ICD-10-CM | POA: Diagnosis not present

## 2022-02-26 DIAGNOSIS — J479 Bronchiectasis, uncomplicated: Secondary | ICD-10-CM | POA: Diagnosis not present

## 2022-02-26 DIAGNOSIS — M869 Osteomyelitis, unspecified: Secondary | ICD-10-CM | POA: Diagnosis not present

## 2022-02-26 LAB — GLUCOSE, CAPILLARY
Glucose-Capillary: 136 mg/dL — ABNORMAL HIGH (ref 70–99)
Glucose-Capillary: 108 mg/dL — ABNORMAL HIGH (ref 70–99)
Glucose-Capillary: 90 mg/dL (ref 70–99)
Glucose-Capillary: 96 mg/dL (ref 70–99)
Glucose-Capillary: 93 mg/dL (ref 70–99)
Glucose-Capillary: 102 mg/dL — ABNORMAL HIGH (ref 70–99)

## 2022-02-26 LAB — RENAL FUNCTION PANEL
Albumin: 2.5 g/dL — ABNORMAL LOW (ref 3.5–5.0)
Anion gap: 10 (ref 5–15)
BUN: 45 mg/dL — ABNORMAL HIGH (ref 8–23)
CO2: 23 mmol/L (ref 22–32)
Calcium: 7.6 mg/dL — ABNORMAL LOW (ref 8.9–10.3)
Chloride: 99 mmol/L (ref 98–111)
Creatinine, Ser: 4.99 mg/dL — ABNORMAL HIGH (ref 0.61–1.24)
GFR, Estimated: 11 mL/min — ABNORMAL LOW (ref >60)
Glucose, Bld: 154 mg/dL — ABNORMAL HIGH (ref 70–99)
Phosphorus: 3.9 mg/dL (ref 2.5–4.6)
Potassium: 4.9 mmol/L (ref 3.5–5.1)
Sodium: 132 mmol/L — ABNORMAL LOW (ref 135–145)

## 2022-02-26 LAB — CBC
HCT: 24.7 % — ABNORMAL LOW (ref 39.0–52.0)
Hemoglobin: 7.6 g/dL — ABNORMAL LOW (ref 13.0–17.0)
MCH: 30.5 pg (ref 26.0–34.0)
MCHC: 30.8 g/dL (ref 30.0–36.0)
MCV: 99.2 fL (ref 80.0–100.0)
Platelets: 164 10*3/uL (ref 150–400)
RBC: 2.49 MIL/uL — ABNORMAL LOW (ref 4.22–5.81)
RDW: 20 % — ABNORMAL HIGH (ref 11.5–15.5)
WBC: 11 10*3/uL — ABNORMAL HIGH (ref 4.0–10.5)
nRBC: 0.2 % (ref 0.0–0.2)

## 2022-02-26 LAB — BLOOD GAS, ARTERIAL
Acid-base deficit: 1.1 mmol/L (ref 0.0–2.0)
Bicarbonate: 23.5 mmol/L (ref 20.0–28.0)
Drawn by: 164
O2 Saturation: 95.3 %
Patient temperature: 37
pCO2 arterial: 38 mmHg (ref 32–48)
pH, Arterial: 7.4 (ref 7.35–7.45)
pO2, Arterial: 72 mmHg — ABNORMAL LOW (ref 83–108)

## 2022-02-26 LAB — TROPONIN I (HIGH SENSITIVITY)
Troponin I (High Sensitivity): 22 ng/L — ABNORMAL HIGH (ref <18)
Troponin I (High Sensitivity): 22 ng/L — ABNORMAL HIGH (ref <18)

## 2022-02-26 LAB — PREPARE RBC (CROSSMATCH)

## 2022-02-26 MED ORDER — OXYCODONE-ACETAMINOPHEN 5-325 MG PO TABS: 1.0000 | ORAL_TABLET | Freq: Four times a day (QID) | ORAL | 0 refills | Status: DC | PRN

## 2022-02-26 MED ORDER — SODIUM CHLORIDE 0.9% IV SOLUTION: Freq: Once | INTRAVENOUS | Status: DC

## 2022-02-26 NOTE — Assessment & Plan Note (Addendum)
Resolved. Hypotension during HD on 3/25.  Isolated event.  Thereafter using been hemodynamically stable.  Troponins remain flat.  Chest x-ray and ABG are unremarkable.  Resolved no obvious evidence of infection.  EKG shows lateral T wave depression but without any chest pain.  Continue to follow-up outpatient. ?

## 2022-02-26 NOTE — Progress Notes (Signed)
OT Cancellation Note ? ?Patient Details ?Name: Tyler Mueller ?MRN: [id number] ?DOB: 1941-01-24 ? ? ?Cancelled Treatment:    Reason Eval/Treat Not Completed: Patient not medically ready;Other (comment) (Pt with AMS and hypotensive, unable to receive dialysis today. OT to check back tomorrow as schedule allows due to changes and pt's need for medical evaluation.) ? ?Jefferey Pica, OTR/L ?Acute Rehabilitation Services ?Office: [phone] ? ? ?Tyler Mueller ?02/26/2022, 1:13 PM ?

## 2022-02-26 NOTE — Progress Notes (Addendum)
[facility] ? ?Subjective  - Ready to go home per patient. ? ? ?Objective ?(!) 135/44 ?83 ?(!) 97.5 ?F (36.4 ?C) (Oral) ?18 ?97% ? ?Intake/Output Summary (Last 24 hours) at 02/26/2022 0754 ?Last data filed at 02/26/2022 0406 ?Gross per 24 hour  ?Intake 240 ml  ?Output 0 ml  ?Net 240 ml  ? ? ?Right groin healing well without hematoma ?Doppler AT/PT intact.  Foot dressing intact with good suction ?Lungs non labored breathing ?Heart RRR ? ?Assessment/Planning: ?POD # 3  ?81 y.o. male is s/p  ?1.  Right common femoral endarterectomy with profundoplasty and bovine pericardial patch angioplasty ?2.  Right common femoral artery to below-knee popliteal artery bypass with 6 mm ringed PTFE ?3.  Right lower extremity arteriogram with runoff ? ?Inflow maintained with good doppler signals ?DR. Sharol Given is following the toe amputation.  Will Maintain dressing until f/u  ?BP 250'I systolic, asymptomatic for dizziness, vac OP minimal ? ?Patient ambulates to bathroom independently with [facility]. ?Plan for discharge today after HD today.  HGB 7.6.  He may benefit from transfusion will let HD decide. ?F/U with VVS in 2 weeks for incisional check.  PT recommended HH will order. ?Cont. Plavix, ASA, and Statin daily. ? ? ?Addendum patient continues to be more confused this am will observe until tomorrow.  I cancelled the discharge order.   ? ?Roxy Horseman ?02/26/2022 ?7:54 AM ?-- ? ?VASCULAR STAFF ADDENDUM: ?I have independently interviewed and examined the patient. ?I agree with the above.  ?Some confusion this AM that improved with conversation.  ?Will check tacro level this evening 12 hours after administration. ?Important for this medication to be administered at the same time daily.  ? ? ?J. Melene Muller, MD ?[facility] ?Office Phone Number: [phone] ?02/26/2022 11:24 AM ? ? ? ?Laboratory ?Lab Results: ?Recent Labs  ?  02/24/22 ?0505 02/26/22 ?0431  ?WBC 10.2 11.0*   ?HGB 8.0* 7.6*  ?HCT 26.6* 24.7*  ?PLT 198 164  ? ?BMET ?Recent Labs  ?  02/25/22 ?0107 02/26/22 ?0431  ?NA 135 132*  ?K 4.5 4.9  ?CL 101 99  ?CO2 27 23  ?GLUCOSE 170* 154*  ?BUN 20 45*  ?CREATININE 3.45* 4.99*  ?CALCIUM 7.5* 7.6*  ? ? ?COAG ?Lab Results  ?Component Value Date  ? INR 1.2 02/22/2022  ? INR 1.4 (H) 01/19/2011  ? INR 2.57 (H) 01/13/2011  ? ?No results found for: PTT ? ? ? ?

## 2022-02-26 NOTE — Progress Notes (Addendum)
?[facility] ?Progress Note  ? ?Subjective:  Brought in to HD unit -- very confused/lethargic today, not his baseline. He has full body rigors, feels warm although T 97.79F. Hypotensive on checks here - 70-80/40-50. WBC 11, Hgb 7.6. Concerning for developing infection? R foot with wound vac, medial R leg wound without erythema/drainage. Will send back to his room to have his primary team assess -> if improves, can plan to dialyze later today. ? ?Objective ?Vitals:  ? 02/25/22 2354 02/26/22 0404 02/26/22 0736 02/26/22 1147  ?BP: (!) 124/52 (!) 120/53 (!) 135/44 (!) 135/49  ?Pulse: 71 77 83 60  ?Resp: '16 15 18 '$ (!) 25  ?Temp: (!) 97.5 ?F (36.4 ?C) (!) 97.4 ?F (36.3 ?C) (!) 97.5 ?F (36.4 ?C) 97.8 ?F (36.6 ?C)  ?TempSrc: Oral Oral Oral Oral  ?SpO2: 100% 100% 97% 93%  ?Weight:  54.3 kg    ?Height:      ? ?Physical Exam ?General: Elderly, ill appearing man, rigors. ?Heart: RRR; no murmur ?Lungs: CTA anteriorly ?Abdomen: soft ?Extremities: No LE edema; R foot with wound vac, medial R leg with 5cm incision - no drainage or erythema ?Dialysis Access: L forearm AVF ? ?Additional Objective ?Labs: ?Basic Metabolic Panel: ?Recent Labs  ?Lab 02/24/22 ?0505 02/25/22 ?0107 02/26/22 ?0431  ?NA 131* 135 132*  ?K 5.8* 4.5 4.9  ?CL 98 101 99  ?CO2 21* 27 23  ?GLUCOSE 260* 170* 154*  ?BUN 47* 20 45*  ?CREATININE 6.62* 3.45* 4.99*  ?CALCIUM 7.8* 7.5* 7.6*  ?PHOS  --  3.4 3.9  ? ?Liver Function Tests: ?Recent Labs  ?Lab 02/25/22 ?0107 02/26/22 ?0431  ?ALBUMIN 2.5* 2.5*  ? ?CBC: ?Recent Labs  ?Lab 02/23/22 ?1627 02/24/22 ?0505 02/26/22 ?0431  ?WBC 9.9 10.2 11.0*  ?HGB 9.3* 8.0* 7.6*  ?HCT 29.7* 26.6* 24.7*  ?MCV 96.7 97.4 99.2  ?PLT 214 198 164  ? ?Medications: ? sodium chloride    ? magnesium sulfate bolus IVPB    ? ? (feeding supplement) PROSource Plus  30 mL Oral BID BM  ? amLODipine  5 mg Oral Daily  ? aspirin EC  81 mg Oral Daily  ? azithromycin  250 mg Oral Q M,W,F  ? calcium acetate  667 mg Oral BID WC  ? carvedilol   25 mg Oral 2 times per day on Sun Mon Wed Fri  ? And  ? carvedilol  25 mg Oral Once per day on Tue Thu Sat  ? Chlorhexidine Gluconate Cloth  6 each Topical Q0600  ? clopidogrel  75 mg Oral Daily  ? docusate sodium  100 mg Oral Daily  ? doxercalciferol  3 mcg Intravenous Q T,Th,Sa-HD  ? famotidine  20 mg Oral q AM  ? gabapentin  300 mg Oral QHS  ? heparin  5,000 Units Subcutaneous Q8H  ? insulin aspart  0-15 Units Subcutaneous TID WC  ? insulin aspart  0-5 Units Subcutaneous QHS  ? lanthanum  1,500 mg Oral BID WC  ? midodrine  5 mg Oral Q T,Th,Sa-HD  ? multivitamin with minerals  1 tablet Oral Daily  ? mupirocin ointment   Nasal BID  ? pantoprazole  40 mg Oral Daily  ? pravastatin  20 mg Oral QPM  ? predniSONE  5 mg Oral Daily  ? sulfamethoxazole-trimethoprim  1 tablet Oral QPM  ? tacrolimus  0.5 mg Oral BID  ? ? ?Dialysis Orders: ?[facility] ?3.5h  350/600 52 kg   2/2 bath  LFA AVF No Heparin ?- Venofer  100 mg IV  X 5 doses (3/5 doses given)  ?- Hectorol 3 mcg IV TIW ?- Mircera 100 mcg IV q 2 weeks (Last dose 02/17/2022) ?  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 Sharol Given: Per primary/Ortho.  ?PAD s/p Right femoral to below knee popliteal bypass grafting with PTFE per Dr. Trula Slade 02/23/2022. Per primary/VVS. ?ESRD: Continue HD on TTS schedule, no heparin. HD today - postponed d/t lethargy. ?BP/ volume: BP fine on last check in room - 70-80 SBP in HD unit today. ?Anemia of ESRD: Hgb down to 8 ->7.6. Transfuse prn. ?Secondary hyperparathyroidism: CorrCa/Phos ok. Continue VDRA, calcium based binders.  ?Hx lung transplant: On prograf and prednisone. Follows with Dr. Doy Mince, [facility] transplant.  ?DMT2 ?Lethargy/rigors: Today when came into HD unit - doesn't look great, send back to his room for eval and will circle back to him for HD later today if doing better. ? ?Veneta Penton, PA-C ?02/26/2022, 12:12 PM  ?[place] ? ?Pt seen, examined and agree w A/P as above.  ?Kelly Splinter   MD ?02/26/2022, 3:58 PM ? ? ? ?

## 2022-02-26 NOTE — Progress Notes (Signed)
?PROGRESS NOTE ? ? ? ?Tyler Mueller  FYB:[id number] DOB: 1941-09-04 DOA: 02/23/2022 ?PCP: Mikey College, MD  ? ?Brief Narrative:  ?81 year old with history of ESRD on hemodialysis, lung transplant in 2004 on immunosuppressants, HTN, bronchiectasis, DM2 presents to the hospital with complaints of diabetic foot ulcer of the right toe.  He underwent revascularization and amputation with wound VAC placement on 02/23/2022 by vascular and orthopedic.  Medical team consulted to help assist with management. ? ? ?Assessment & Plan: ? Active Problems: ?  PVD (peripheral vascular disease) (HCC) s/p femoral-popliteal bypass surgery ?  BRONCHIECTASIS ?  ESRD on hemodialysis ([place]) ?  S/P femoral-popliteal bypass surgery ?  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  Rigors ?  Type 2 diabetes mellitus with peripheral neuropathy (HCC) ?  History of lung transplant ([place]) secondary to bronchiectasis ?  Essential hypertension ?  ? ? ?Assessment and Plan: ?PVD (peripheral vascular disease) ([place]) s/p femoral-popliteal bypass surgery ?Status post bypass and endarterectomy by vascular 3/22 ? ?Rigors ?Prior to starting dialysis, no clear etiology.  UA, chest x-ray has been ordered. ? 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 ?S/P femoral-popliteal bypass surgery ?Performed by vascular on 3/22 ? ?ESRD on hemodialysis ([place]) ?Hemodialysis TTS, nephrology team following ?HD planned today ? ?BRONCHIECTASIS ?Bronchodilators, I-S/flutter valve.  He is status post bilateral lung transplant on chronic immunosuppressant follows at [facility]. ? ?Essential hypertension ?Continue current blood pressure medication regimen. ? ? ? ?History of lung transplant ([place]) secondary to bronchiectasis ?Status post bilateral lung transplant at [facility] in 2004. ?Home medications- Prograf, prednisone,  Bactrim ? ? ?Type 2 diabetes mellitus with peripheral neuropathy (HCC) ?Sliding scale and Accu-Chek ?Due to some hypoglycemia will discontinue Premeal insulin ?A1c 02/23/2022-5.8 ? ? ? ? ? ?DVT prophylaxis: Subcu heparin ?Code Status: Full code ?Family Communication:   ? ? ? ? ?Subjective: ?Patient seen and examined by me in his room prior to his hemodialysis he was doing well no complaints. ? ?Prior to his HD patient developed some rigors and chills.  He was eval by nephrology and sent back to the room. ?Examination: ? ?General exam: Appears calm and comfortable  ?Respiratory system: Clear to auscultation. Respiratory effort normal. ?Cardiovascular system: S1 & S2 heard, RRR. No JVD, murmurs, rubs, gallops or clicks. No pedal edema. ?Gastrointestinal system: Abdomen is nondistended, soft and nontender. No organomegaly or masses felt. Normal bowel sounds heard. ?Central nervous system: Alert and oriented. No focal neurological deficits. ?Extremities: Symmetric 5 x 5 power. ?Skin: Amputation site noted with wound VAC in place. ?Psychiatry: Judgement and insight appear normal. Mood & affect appropriate.  ? ? ? ?Objective: ?Vitals:  ? 02/26/22 0404 02/26/22 0736 02/26/22 1147 02/26/22 1213  ?BP: (!) 120/53 (!) 135/44 (!) 135/49 (!) 74/50  ?Pulse: 77 83 60 89  ?Resp: 15 18 (!) 25 (!) 23  ?Temp: (!) 97.4 ?F (36.3 ?C) (!) 97.5 ?F (36.4 ?C) 97.8 ?F (36.6 ?C) 98.7 ?F (37.1 ?C)  ?TempSrc: Oral Oral Oral   ?SpO2: 100% 97% 93%   ?Weight: 54.3 kg     ?Height:      ? ? ?Intake/Output Summary (Last 24 hours) at 02/26/2022 1236 ?Last data filed at 02/26/2022 0406 ?Gross per 24 hour  ?Intake 240 ml  ?Output 0 ml  ?Net 240 ml  ? ?Filed Weights  ?  02/23/22 5643 02/24/22 0734 02/26/22 0404  ?Weight: 52.2 kg 58.1 kg 54.3 kg  ? ? ? ?Data Reviewed:  ? ?CBC: ?Recent Labs  ?Lab 02/22/22 ?0718 02/23/22 ?0622 02/23/22 ?1627 02/24/22 ?0505 02/26/22 ?0431  ?WBC  --   --  9.9 10.2 11.0*  ?HGB 11.9* 11.2* 9.3* 8.0* 7.6*  ?HCT 35.0* 33.0* 29.7*  26.6* 24.7*  ?MCV  --   --  96.7 97.4 99.2  ?PLT  --   --  214 198 164  ? ?Basic Metabolic Panel: ?Recent Labs  ?Lab 02/22/22 ?0718 02/23/22 ?0622 02/23/22 ?1627 02/24/22 ?0505 02/25/22 ?0107 02/26/22 ?0431  ?NA 140 137  --  131* 135 132*  ?K 3.9 4.5  --  5.8* 4.5 4.9  ?CL 100 102  --  98 101 99  ?CO2  --   --   --  21* 27 23  ?GLUCOSE 130* 58*  --  260* 170* 154*  ?BUN 22 29*  --  47* 20 45*  ?CREATININE 4.40* 5.90* 5.90* 6.62* 3.45* 4.99*  ?CALCIUM  --   --   --  7.8* 7.5* 7.6*  ?PHOS  --   --   --   --  3.4 3.9  ? ?GFR: ?Estimated Creatinine Clearance: 9.4 mL/min (A) (by C-G formula based on SCr of 4.99 mg/dL (H)). ?Liver Function Tests: ?Recent Labs  ?Lab 02/25/22 ?0107 02/26/22 ?0431  ?ALBUMIN 2.5* 2.5*  ? ?No results for input(s): LIPASE, AMYLASE in the last 168 hours. ?No results for input(s): AMMONIA in the last 168 hours. ?Coagulation Profile: ?Recent Labs  ?Lab 02/22/22 ?1338  ?INR 1.2  ? ?Cardiac Enzymes: ?No results for input(s): CKTOTAL, CKMB, CKMBINDEX, TROPONINI in the last 168 hours. ?BNP (last 3 results) ?No results for input(s): PROBNP in the last 8760 hours. ?HbA1C: ?Recent Labs  ?  02/23/22 ?1627  ?HGBA1C 5.8*  ? ?CBG: ?Recent Labs  ?Lab 02/25/22 ?2144 02/25/22 ?2208 02/26/22 ?0618 02/26/22 ?1021 02/26/22 ?1145  ?GLUCAP 59* 73 136* 108* 90  ? ?Lipid Profile: ?Recent Labs  ?  02/24/22 ?0505  ?CHOL 114  ?HDL 46  ?[place] 58  ?TRIG 50  ?CHOLHDL 2.5  ? ?Thyroid Function Tests: ?No results for input(s): TSH, T4TOTAL, FREET4, T3FREE, THYROIDAB in the last 72 hours. ?Anemia Panel: ?No results for input(s): VITAMINB12, FOLATE, FERRITIN, TIBC, IRON, RETICCTPCT in the last 72 hours. ?Sepsis Labs: ?No results for input(s): PROCALCITON, LATICACIDVEN in the last 168 hours. ? ?Recent Results (from the past 240 hour(s))  ?Surgical PCR Screen     Status: Abnormal  ? Collection Time: 02/22/22  1:18 PM  ? Specimen: Nasal Mucosa; Nasal Swab  ?Result Value Ref Range Status  ? MRSA, PCR NEGATIVE NEGATIVE Final  ?  Staphylococcus aureus POSITIVE (A) NEGATIVE Final  ?  Comment: (NOTE) ?The Xpert SA Assay (FDA approved for NASAL specimens in patients 66 ?years of age and older), is one component of a comprehensive ?surveillance program. It is not intended to diagnose infection nor to ?guide or monitor treatment. ?Performed at [facility], [place] [address], [place], [place] ?[address] ?  ?  ? ? ? ? ? ?Radiology Studies: ?No results found. ? ? ? ? ? ?Scheduled Meds: ? (feeding supplement) PROSource Plus  30 mL Oral BID BM  ? sodium chloride   Intravenous Once  ? amLODipine  5 mg Oral Daily  ? aspirin EC  81 mg Oral Daily  ? azithromycin  250 mg Oral Q M,W,F  ? calcium acetate  667  mg Oral BID WC  ? carvedilol  25 mg Oral 2 times per day on Sun Mon Wed Fri  ? And  ? carvedilol  25 mg Oral Once per day on Tue Thu Sat  ? Chlorhexidine Gluconate Cloth  6 each Topical Q0600  ? clopidogrel  75 mg Oral Daily  ? docusate sodium  100 mg Oral Daily  ? doxercalciferol  3 mcg Intravenous Q T,Th,Sa-HD  ? famotidine  20 mg Oral q AM  ? gabapentin  300 mg Oral QHS  ? heparin  5,000 Units Subcutaneous Q8H  ? insulin aspart  0-15 Units Subcutaneous TID WC  ? insulin aspart  0-5 Units Subcutaneous QHS  ? lanthanum  1,500 mg Oral BID WC  ? midodrine  5 mg Oral Q T,Th,Sa-HD  ? multivitamin with minerals  1 tablet Oral Daily  ? mupirocin ointment   Nasal BID  ? pantoprazole  40 mg Oral Daily  ? pravastatin  20 mg Oral QPM  ? predniSONE  5 mg Oral Daily  ? sulfamethoxazole-trimethoprim  1 tablet Oral QPM  ? tacrolimus  0.5 mg Oral BID  ? ?Continuous Infusions: ? sodium chloride    ? magnesium sulfate bolus IVPB    ? ? ? LOS: 3 days  ? ?Time spent= 35 mins ? ? ? ?Artis Buechele Arsenio Loader, MD ?[facility] ? ?If 7PM-7AM, please contact night-coverage ? ?02/26/2022, 12:36 PM  ? ?

## 2022-02-26 NOTE — Progress Notes (Signed)
Pt seen after dialysis return.  ?Normotensive, alert and oriented. Able to answer questions.  ?Denied chest pain or other.  ?Somewhat somnolent. ?CXR unremarkable, ABG unremarkable - will give supplemental O2 ?EKG with some lateral depression, asymptomatic but will get trops. Expect first round to be elevated due to EKG, will follow up second round.  ? ?Discussed with Dr. Reesa Chew as well.  ? ?Tyler John MD Vascular  ? ?

## 2022-02-26 NOTE — Progress Notes (Signed)
RT NOTE.  ?ABG drawn and sent down to lab, lab notified.  ?

## 2022-02-26 NOTE — TOC Transition Note (Signed)
Transition of Care (TOC) - CM/SW Discharge Note ? ? ?Patient Details  ?Name: Tyler Mueller ?MRN: [id number] ?Date of Birth: 06-May-1941 ? ?Transition of Care (TOC) CM/SW Contact:  ?Konrad Penta, RN ?Phone Number: [phone] ?02/26/2022, 9:20 AM ? ? ?Clinical Narrative:   Spoke with patient's wife regarding home recommendations for HHC PT. Mrs. Bohnet reports patient was active with [facility] for PT/RN services. Orders in for [facility] PT since patient does not have dressing changes at this time. Will go home with [place]. Spoke with patient's nurse to confirm Mr. Bayless has rollator and cane at home. No DME needs. Patient will go home after HD today.  ? ?Spoke with Sharmon Revere with [facility] to make aware of patient's dc today after HD and need for resumption of HHC PT. ? ?No further TOC needs assessed. Please re-consult TOC if needed.  ? ? ? ?Final next level of care: [place] ?Barriers to Discharge: No Barriers Identified (will dc after HD) ? ? ?Patient Goals and CMS Choice ?  ?CMS Medicare.gov Compare Post Acute Care list provided to:: Other (Comment Required) (spouse) ?Choice offered to / list presented to : Spouse ? ?Discharge Placement ?  ?           ?  ?  ?  ?  ? ?Discharge Plan and Services ?  ?  ?           ?  ?  ?  ?  ?  ?HH Arranged: PT ?[place] Agency: [facility] ?Date HH Agency Contacted: 02/26/22 ?Time [place]: [phone] ?Representative spoke with at [place]: Sharmon Revere ? ?Social Determinants of Health (SDOH) Interventions ?  ? ? ?Readmission Risk Interventions ?   ? View : No data to display.  ?  ?  ?  ? ? ? ? ? ?

## 2022-02-26 NOTE — Progress Notes (Signed)
Physical Therapy Treatment ?Patient Details ?Name: Tyler Mueller ?MRN: [id number] ?DOB: Jul 11, 1941 ?Today's Date: 02/26/2022 ? ? ?History of Present Illness 81 y/o male who was admitted 02/23/22 with gangrene on R 3rd toe. S/p abdominal aortogram w/ RLE and peripheral vascular balloon angioplasty (right) on 02/22/22. S/p Right femoral to below-knee popliteal artery bypass graft and Right common femoral and profundofemoral endarterectomy as well as amputation of the third ray with wound vac on 02/23/22. PMH includes Diabetes mellitus without complication, Diverticulitis, ESRD on hemodialysis (T, Th, Sat), History of lung transplant (04/21/2014; 12/05/2002), HTN, and Skin cancer of face, Cataract extraction; Appendectomy. Recently admitted in December 2022 due to flu, frequent falls. ? ?  ?PT Comments  ? ? Pt with significantly increased confusion this AM, now oriented to self only; RN and PA aware. Pt with episode of bowel incontinence without awareness; PT provided peri care. Pt requiring min assist for transfers and ambulating 20 ft with a walker. Fatigues easily. Will reassess.  ?  ?Recommendations for follow up therapy are one component of a multi-disciplinary discharge planning process, led by the attending physician.  Recommendations may be updated based on patient status, additional functional criteria and insurance authorization. ? ?Follow Up Recommendations ? Home health PT (if cognition doesn't improve, may need SNF) ?  ?  ?Assistance Recommended at Discharge Frequent or constant Supervision/Assistance  ?Patient can return home with the following A little help with bathing/dressing/bathroom;Assistance with cooking/housework;Assist for transportation;Help with stairs or ramp for entrance;A little help with walking and/or transfers ?  ?Equipment Recommendations ? Wheelchair (measurements PT);Wheelchair cushion (measurements PT)  ?  ?Recommendations for Other Services   ? ? ?  ?Precautions / Restrictions  Precautions ?Precautions: Fall ?Precaution Comments: wound vac R foot ?Required Braces or Orthoses: Other Brace ?Other Brace: Darco shoe in room ?Restrictions ?Weight Bearing Restrictions: Yes ?RLE Weight Bearing: Partial weight bearing ?RLE Partial Weight Bearing Percentage or Pounds: Partial WB - Heel only (confirmed with Sharol Given MD 3/23)  ?  ? ?Mobility ? Bed Mobility ?Overal bed mobility: Needs Assistance ?Bed Mobility: Supine to Sit, Sit to Supine ?  ?  ?Supine to sit: Min guard, HOB elevated ?Sit to supine: HOB elevated, Min assist ?  ?General bed mobility comments: MinA for LE assist back into bed ?  ? ?Transfers ?Overall transfer level: Needs assistance ?Equipment used: Rolling walker (2 wheels), Rollator (4 wheels) ?Transfers: Sit to/from Stand ?Sit to Stand: Min assist ?  ?  ?  ?  ?  ?General transfer comment: MinA to rise from edge of bed x 3 ?  ? ?Ambulation/Gait ?Ambulation/Gait assistance: Min assist ?Gait Distance (Feet): 15 Feet ?Assistive device: Rolling walker (2 wheels) ?Gait Pattern/deviations: Step-to pattern, Decreased step length - left, Decreased stance time - right, Decreased weight shift to right, Antalgic, Trunk flexed ?Gait velocity: reduced ?Gait velocity interpretation: <1.31 ft/sec, indicative of household ambulator ?  ?General Gait Details: Darco shoe and L shoe donned, pt requiring minA for balance, significantly increased trunk/hip flexion and R knee flexed, fatigues very easily ? ? ?Stairs ?  ?  ?  ?  ?  ? ? ?Wheelchair Mobility ?  ? ?Modified Rankin (Stroke Patients Only) ?  ? ? ?  ?Balance Overall balance assessment: Needs assistance ?Sitting-balance support: No upper extremity supported, Feet supported ?Sitting balance-Leahy Scale: Fair ?  ?  ?Standing balance support: Bilateral upper extremity supported, During functional activity ?Standing balance-Leahy Scale: Poor ?Standing balance comment: Reliant on BUE support ?  ?  ?  ?  ?  ?  ?  ?  ?  ?  ?  ?  ? ?  ?  Cognition  Arousal/Alertness: Awake/alert ?Behavior During Therapy: [facility] for tasks assessed/performed ?Overall Cognitive Status: Impaired/Different from baseline ?Area of Impairment: Orientation, Safety/judgement, Awareness, Problem solving ?  ?  ?  ?  ?  ?  ?  ?  ?Orientation Level: Disoriented to, Place, Time, Situation ?  ?  ?  ?Safety/Judgement: Decreased awareness of safety, Decreased awareness of deficits ?Awareness: Intellectual ?Problem Solving: Slow processing, Requires verbal cues, Requires tactile cues ?General Comments: Pt now oriented to self only, pt stating, "please help me," but cannot verbalize with what. Pt reporting he is in his bedroom and cannot state what month is even when given options. ?  ?  ? ?  ?Exercises   ? ?  ?General Comments   ?  ?  ? ?Pertinent Vitals/Pain Pain Assessment ?Pain Assessment: Faces ?Faces Pain Scale: Hurts little more ?Pain Location: R foot and leg ?Pain Descriptors / Indicators: Operative site guarding ?Pain Intervention(s): Limited activity within patient's tolerance, Monitored during session  ? ? ?Home Living   ?  ?  ?  ?  ?  ?  ?  ?  ?  ?   ?  ?Prior Function    ?  ?  ?   ? ?PT Goals (current goals can now be found in the care plan section) Acute Rehab PT Goals ?Patient Stated Goal: to get better and go home ?PT Goal Formulation: With patient/family ?Time For Goal Achievement: 03/10/22 ?Potential to Achieve Goals: Good ?Progress towards PT goals: Not progressing toward goals - comment (more confused) ? ?  ?Frequency ? ? ? Min 3X/week ? ? ? ?  ?PT Plan Current plan remains appropriate  ? ? ?Co-evaluation   ?  ?  ?  ?  ? ?  ?AM-PAC PT "6 Clicks" Mobility   ?Outcome Measure ? Help needed turning from your back to your side while in a flat bed without using bedrails?: A Little ?Help needed moving from lying on your back to sitting on the side of a flat bed without using bedrails?: A Little ?Help needed moving to and from a bed to a chair (including a wheelchair)?: A Little ?Help  needed standing up from a chair using your arms (e.g., wheelchair or bedside chair)?: A Little ?Help needed to walk in hospital room?: A Little ?Help needed climbing 3-5 steps with a railing? : Total ?6 Click Score: 16 ? ?  ?End of Session Equipment Utilized During Treatment: Gait belt ?Activity Tolerance: Patient limited by fatigue ?Patient left: with call bell/phone within reach;in bed;with bed alarm set;Other (comment) (pt leaving for HD) ?Nurse Communication: Mobility status ?PT Visit Diagnosis: Unsteadiness on feet (R26.81);Other abnormalities of gait and mobility (R26.89);Muscle weakness (generalized) (M62.81);History of falling (Z91.81);Difficulty in walking, not elsewhere classified (R26.2);Pain ?Pain - Right/Left: Right ?Pain - part of body: Ankle and joints of foot ?  ? ? ?Time: 2229-7989 ?PT Time Calculation (min) (ACUTE ONLY): 16 min ? ?Charges:  $Therapeutic Activity: 8-22 mins          ?          ? ?Wyona Almas, PT, DPT ?Acute Rehabilitation Services ?Pager [phone] ?Office [phone] ? ? ? ?Deno Etienne ?02/26/2022, 10:56 AM ? ?

## 2022-02-27 DIAGNOSIS — N186 End stage renal disease: Secondary | ICD-10-CM | POA: Diagnosis not present

## 2022-02-27 DIAGNOSIS — M869 Osteomyelitis, unspecified: Secondary | ICD-10-CM | POA: Diagnosis not present

## 2022-02-27 DIAGNOSIS — D649 Anemia, unspecified: Secondary | ICD-10-CM

## 2022-02-27 DIAGNOSIS — I739 Peripheral vascular disease, unspecified: Secondary | ICD-10-CM | POA: Diagnosis not present

## 2022-02-27 DIAGNOSIS — R6889 Other general symptoms and signs: Secondary | ICD-10-CM | POA: Diagnosis not present

## 2022-02-27 LAB — BASIC METABOLIC PANEL
Anion gap: 8 (ref 5–15)
BUN: 14 mg/dL (ref 8–23)
CO2: 27 mmol/L (ref 22–32)
Calcium: 7 mg/dL — ABNORMAL LOW (ref 8.9–10.3)
Chloride: 100 mmol/L (ref 98–111)
Creatinine, Ser: 2.49 mg/dL — ABNORMAL HIGH (ref 0.61–1.24)
GFR, Estimated: 26 mL/min — ABNORMAL LOW (ref >60)
Glucose, Bld: 120 mg/dL — ABNORMAL HIGH (ref 70–99)
Potassium: 3.3 mmol/L — ABNORMAL LOW (ref 3.5–5.1)
Sodium: 135 mmol/L (ref 135–145)

## 2022-02-27 LAB — CBC
HCT: 24.9 % — ABNORMAL LOW (ref 39.0–52.0)
Hemoglobin: 7.7 g/dL — ABNORMAL LOW (ref 13.0–17.0)
MCH: 30 pg (ref 26.0–34.0)
MCHC: 30.9 g/dL (ref 30.0–36.0)
MCV: 96.9 fL (ref 80.0–100.0)
Platelets: 162 10*3/uL (ref 150–400)
RBC: 2.57 MIL/uL — ABNORMAL LOW (ref 4.22–5.81)
RDW: 19.9 % — ABNORMAL HIGH (ref 11.5–15.5)
WBC: 11 10*3/uL — ABNORMAL HIGH (ref 4.0–10.5)
nRBC: 0 % (ref 0.0–0.2)

## 2022-02-27 LAB — GLUCOSE, CAPILLARY
Glucose-Capillary: 69 mg/dL — ABNORMAL LOW (ref 70–99)
Glucose-Capillary: 126 mg/dL — ABNORMAL HIGH (ref 70–99)
Glucose-Capillary: 69 mg/dL — ABNORMAL LOW (ref 70–99)
Glucose-Capillary: 92 mg/dL (ref 70–99)
Glucose-Capillary: 164 mg/dL — ABNORMAL HIGH (ref 70–99)
Glucose-Capillary: 138 mg/dL — ABNORMAL HIGH (ref 70–99)

## 2022-02-27 MED ORDER — GLUCAGON HCL RDNA (DIAGNOSTIC) 1 MG IJ SOLR: 1.0000 mg | Freq: Once | INTRAMUSCULAR | Status: DC | PRN

## 2022-02-27 NOTE — Progress Notes (Signed)
Occupational Therapy Treatment ?Patient Details ?Name: Tyler Mueller ?MRN: [id number] ?DOB: 1941-08-13 ?Today's Date: 02/27/2022 ? ? ?History of present illness 81 y/o male who was admitted 02/23/22 with gangrene on R 3rd toe. S/p abdominal aortogram w/ RLE and peripheral vascular balloon angioplasty (right) on 02/22/22. S/p Right femoral to below-knee popliteal artery bypass graft and Right common femoral and profundofemoral endarterectomy as well as amputation of the third ray with wound vac on 02/23/22. PMH includes Diabetes mellitus without complication, Diverticulitis, ESRD on hemodialysis (T, Th, Sat), History of lung transplant (04/21/2014; 12/05/2002), HTN, and Skin cancer of face, Cataract extraction; Appendectomy. Recently admitted in December 2022 due to flu, frequent falls. ?  ?OT comments ? Upon arrival, pt awake and supine in bed with wife at bedside. Pt presenting with decreased cognition as seen by poor problem solving, orientation, attention, and following of cues. Despite fatigue and cognitive changes, pt continues to be motivated to participate in therapy. Max A for donning shoe and darco shoe. Pt requiring Mod A for functional mobility in room for balance and RW management. Discussing functional change and rehab options, wife hopeful this change is related to HD and he will improve in the next few days. Currently, recommend daily therapy at post-acute rehab, however, if dc to home, pt will need HHOT, HHPT, and [place] aide. Will continue to follow acutely as admitted.  ? ?Recommendations for follow up therapy are one component of a multi-disciplinary discharge planning process, led by the attending physician.  Recommendations may be updated based on patient status, additional functional criteria and insurance authorization. ?   ?Follow Up Recommendations ? Skilled nursing-short term rehab (<3 hours/day) (If not post-acute rehab, will need HHOT, HHPT, and HH aid.)  ?  ?Assistance Recommended at Discharge  Frequent or constant Supervision/Assistance  ?Patient can return home with the following ? A lot of help with walking and/or transfers;A lot of help with bathing/dressing/bathroom ?  ?Equipment Recommendations ? None recommended by OT  ?  ?Recommendations for Other Services PT consult ? ?  ?Precautions / Restrictions Precautions ?Precautions: Fall ?Precaution Comments: wound vac R foot ?Required Braces or Orthoses: Other Brace ?Other Brace: Darco shoe in room ?Restrictions ?Weight Bearing Restrictions: Yes ?RLE Weight Bearing: Partial weight bearing ?RLE Partial Weight Bearing Percentage or Pounds: Partial WB - Heel only (confirmed with Sharol Given MD 3/23) ?Other Position/Activity Restrictions: wound vac RLE  ? ? ?  ? ?Mobility Bed Mobility ?Overal bed mobility: Needs Assistance ?Bed Mobility: Supine to Sit ?  ?  ?Supine to sit: Min assist ?  ?  ?General bed mobility comments: Min A for elevating trunk ?  ? ?Transfers ?Overall transfer level: Needs assistance ?Equipment used: Rolling walker (2 wheels), Rollator (4 wheels) ?Transfers: Sit to/from Stand ?Sit to Stand: Min assist ?  ?  ?  ?  ?  ?General transfer comment: Min A for power up into standing ?  ?  ?Balance Overall balance assessment: Needs assistance ?Sitting-balance support: No upper extremity supported, Feet supported ?Sitting balance-Leahy Scale: Fair ?  ?  ?Standing balance support: Bilateral upper extremity supported, During functional activity ?Standing balance-Leahy Scale: Poor ?Standing balance comment: Reliant on BUE support ?  ?  ?  ?  ?  ?  ?  ?  ?  ?  ?  ?   ? ?ADL either performed or assessed with clinical judgement  ? ?ADL Overall ADL's : Needs assistance/impaired ?  ?  ?  ?  ?  ?  ?  ?  ?  ?  ?  Lower Body Dressing: Maximal assistance;Sit to/from stand ?Lower Body Dressing Details (indicate cue type and reason): Max A to don shoe and darco shoe ?Toilet Transfer: Minimal assistance;Ambulation;Rolling walker (2 wheels) (simulated to recliner) ?Toilet  Transfer Details (indicate cue type and reason): Min A for power up and gaining balance ?  ?  ?  ?  ?Functional mobility during ADLs: Moderate assistance;Rolling walker (2 wheels) ?General ADL Comments: Pt performing functional mobility in room with Mod A for Rw management. Demonstrating decreased cognition and balance compared to prior session ?  ? ?Extremity/Trunk Assessment Upper Extremity Assessment ?Upper Extremity Assessment: Generalized weakness ?  ?Lower Extremity Assessment ?Lower Extremity Assessment: Defer to PT evaluation ?  ?  ?  ? ?Vision   ?  ?  ?Perception   ?  ?Praxis   ?  ? ?Cognition Arousal/Alertness: Awake/alert ?Behavior During Therapy: [facility] for tasks assessed/performed ?Overall Cognitive Status: Impaired/Different from baseline ?Area of Impairment: Orientation, Safety/judgement, Awareness, Problem solving, Following commands ?  ?  ?  ?  ?  ?  ?  ?  ?Orientation Level: Disoriented to, Time, Situation ?  ?  ?Following Commands: Follows one step commands with increased time ?Safety/Judgement: Decreased awareness of safety, Decreased awareness of deficits ?Awareness: Intellectual ?Problem Solving: Slow processing, Requires verbal cues, Requires tactile cues ?General Comments: Pt reporting is it the year 1923; able to correct with Mod cues. Able to state it is March. Pt with decreased processing and requiring increased time. Pt with poor awareness and difficulty following cues. At end of session, pt becoming very fatigues and falling asleep during conversation ?  ?  ?   ?Exercises   ? ?  ?Shoulder Instructions   ? ? ?  ?General Comments Wife present throughout. Long discussion about pt's change in function as well as dc options (hh vs snf). Wife feels that his is related to his dialysis schedule. Recommending SNF rehab so pt can recieve therapy daily. Wife hopeful and wants to see his progress over the next few days  ? ? ?Pertinent Vitals/ Pain       Pain Assessment ?Pain Assessment: Faces ?Faces  Pain Scale: Hurts little more ?Pain Location: R foot and leg ?Pain Descriptors / Indicators: Operative site guarding ?Pain Intervention(s): Monitored during session, Limited activity within patient's tolerance, Repositioned ? ?Home Living   ?  ?  ?  ?  ?  ?  ?  ?  ?  ?  ?  ?  ?  ?  ?  ?  ?  ?  ? ?  ?Prior Functioning/Environment    ?  ?  ?  ?   ? ?Frequency ? Min 2X/week  ? ? ? ? ?  ?Progress Toward Goals ? ?OT Goals(current goals can now be found in the care plan section) ? Progress towards OT goals: Not progressing toward goals - comment (change in functional performance and cognition) ? ?Acute Rehab OT Goals ?OT Goal Formulation: With patient ?Time For Goal Achievement: 03/10/22 ?Potential to Achieve Goals: Good ?ADL Goals ?Pt Will Perform Lower Body Dressing: with modified independence;sit to/from stand ?Pt Will Transfer to Toilet: with modified independence;ambulating;regular height toilet ?Pt Will Perform Toileting - Clothing Manipulation and hygiene: with modified independence;sitting/lateral leans;sit to/from stand ?Pt Will Perform Tub/Shower Transfer: Shower transfer;with modified independence;ambulating;shower seat;rolling walker  ?Plan Discharge plan needs to be updated   ? ?Co-evaluation ? ? ?   ?  ?  ?  ?  ? ?  ?AM-PAC OT "6 Clicks" Daily  Activity     ?Outcome Measure ? ? Help from another person eating meals?: None ?Help from another person taking care of personal grooming?: A Little ?Help from another person toileting, which includes using toliet, bedpan, or urinal?: A Little ?Help from another person bathing (including washing, rinsing, drying)?: A Lot ?Help from another person to put on and taking off regular upper body clothing?: A Little ?Help from another person to put on and taking off regular lower body clothing?: A Lot ?6 Click Score: 17 ? ?  ?End of Session Equipment Utilized During Treatment: Rolling walker (2 wheels) ? ?OT Visit Diagnosis: Other abnormalities of gait and mobility  (R26.89);Muscle weakness (generalized) (M62.81);Unsteadiness on feet (R26.81) ?  ?Activity Tolerance Patient tolerated treatment well ?  ?Patient Left in chair;with call bell/phone within reach;with chair alarm se

## 2022-02-27 NOTE — Progress Notes (Signed)
Physical Therapy Treatment ?Patient Details ?Name: Tyler Mueller ?MRN: [id number] ?DOB: [birth year] ?Today's Date: 02/27/2022 ? ? ?History of Present Illness 81 y/o male who was admitted 02/23/22 with gangrene on R 3rd toe. S/p abdominal aortogram w/ RLE and peripheral vascular balloon angioplasty (right) on 02/22/22. S/p Right femoral to below-knee popliteal artery bypass graft and Right common femoral and profundofemoral endarterectomy as well as amputation of the third ray with wound vac on 02/23/22. PMH includes Diabetes mellitus without complication, Diverticulitis, ESRD on hemodialysis (T, Th, Sat), History of lung transplant (04/21/2014; 12/05/2002), HTN, and Skin cancer of face, Cataract extraction; Appendectomy. Recently admitted in December 2022 due to flu, frequent falls. ? ?  ?PT Comments  ? ? Pt demonstrating improved alertness, however, continues with worsened cognitive and mobility impairments in comparison to two days ago. Pt requiring min-modA for transfers for functional mobility. Ambulating ~15 ft with a RW and fatigues easily. Pt not oriented to time and displays delayed processing. Will continue to assess. ?   ?Recommendations for follow up therapy are one component of a multi-disciplinary discharge planning process, led by the attending physician.  Recommendations may be updated based on patient status, additional functional criteria and insurance authorization. ? ?Follow Up Recommendations ? Home health PT (declining SNF currently) ?  ?  ?Assistance Recommended at Discharge Frequent or constant Supervision/Assistance  ?Patient can return home with the following A little help with bathing/dressing/bathroom;Assistance with cooking/housework;Assist for transportation;Help with stairs or ramp for entrance;A little help with walking and/or transfers ?  ?Equipment Recommendations ? Wheelchair (measurements PT);Wheelchair cushion (measurements PT)  ?  ?Recommendations for Other Services   ? ? ?   ?Precautions / Restrictions Precautions ?Precautions: Fall ?Precaution Comments: wound vac R foot ?Required Braces or Orthoses: Other Brace ?Other Brace: Darco shoe in room ?Restrictions ?Weight Bearing Restrictions: Yes ?RLE Weight Bearing: Partial weight bearing ?RLE Partial Weight Bearing Percentage or Pounds: Partial WB - Heel only (confirmed with Sharol Given MD 3/23)  ?  ? ?Mobility ? Bed Mobility ?Overal bed mobility: Needs Assistance ?Bed Mobility: Supine to Sit, Sit to Supine ?  ?  ?Supine to sit: HOB elevated, Min assist ?Sit to supine: HOB elevated, Min assist ?  ?General bed mobility comments: Pt reaching for external support to assist trunk to upright. MinA for LE assist back into bed ?  ? ?Transfers ?Overall transfer level: Needs assistance ?Equipment used: Rolling walker (2 wheels), Rollator (4 wheels) ?Transfers: Sit to/from Stand ?Sit to Stand: Min assist, Mod assist ?  ?  ?  ?  ?  ?General transfer comment: Min-modA to rise from edge of bed, cues for hand placement ?  ? ?Ambulation/Gait ?Ambulation/Gait assistance: Min assist ?Gait Distance (Feet): 15 Feet ?Assistive device: Rolling walker (2 wheels) ?Gait Pattern/deviations: Step-to pattern, Decreased step length - left, Decreased stance time - right, Decreased weight shift to right, Antalgic, Trunk flexed ?Gait velocity: reduced ?  ?  ?General Gait Details: Darco shoe and L shoe donned, pt requiring minA for balance, significantly increased trunk/hip flexion and R knee flexed, fatigues very easily ? ? ?Stairs ?  ?  ?  ?  ?  ? ? ?Wheelchair Mobility ?  ? ?Modified Rankin (Stroke Patients Only) ?  ? ? ?  ?Balance Overall balance assessment: Needs assistance ?Sitting-balance support: No upper extremity supported, Feet supported ?Sitting balance-Leahy Scale: Fair ?  ?  ?Standing balance support: Bilateral upper extremity supported, During functional activity ?Standing balance-Leahy Scale: Poor ?Standing balance comment: Reliant on BUE support ?  ?  ?  ?  ?   ?  ?  ?  ?  ?  ?  ?  ? ?  ?  Cognition Arousal/Alertness: Awake/alert ?Behavior During Therapy: [facility] for tasks assessed/performed ?Overall Cognitive Status: Impaired/Different from baseline ?Area of Impairment: Orientation, Safety/judgement, Awareness, Problem solving ?  ?  ?  ?  ?  ?  ?  ?  ?Orientation Level: Disoriented to, Time, Situation ?  ?  ?  ?Safety/Judgement: Decreased awareness of safety, Decreased awareness of deficits ?Awareness: Intellectual ?Problem Solving: Slow processing, Requires verbal cues, Requires tactile cues ?General Comments: Pt unable to state month and states year is 67. Very delayed processing and requires repeated cues ?  ?  ? ?  ?Exercises General Exercises - Lower Extremity ?Ankle Circles/Pumps: Both, 10 reps, Supine ?Long Arc Quad: AAROM, Both, 5 reps, Seated ?Heel Slides: AAROM, Both, 10 reps, Supine ? ?  ?General Comments   ?  ?  ? ?Pertinent Vitals/Pain Pain Assessment ?Pain Assessment: Faces ?Faces Pain Scale: Hurts little more ?Pain Location: R foot and leg ?Pain Descriptors / Indicators: Operative site guarding ?Pain Intervention(s): Monitored during session, Limited activity within patient's tolerance, Premedicated before session  ? ? ?Home Living   ?  ?  ?  ?  ?  ?  ?  ?  ?  ?   ?  ?Prior Function    ?  ?  ?   ? ?PT Goals (current goals can now be found in the care plan section) Acute Rehab PT Goals ?Patient Stated Goal: to get better and go home ?PT Goal Formulation: With patient/family ?Time For Goal Achievement: 03/10/22 ?Potential to Achieve Goals: Good ?Progress towards PT goals: Progressing toward goals ? ?  ?Frequency ? ? ? Min 3X/week ? ? ? ?  ?PT Plan Current plan remains appropriate  ? ? ?Co-evaluation   ?  ?  ?  ?  ? ?  ?AM-PAC PT "6 Clicks" Mobility   ?Outcome Measure ? Help needed turning from your back to your side while in a flat bed without using bedrails?: A Little ?Help needed moving from lying on your back to sitting on the side of a flat bed without using  bedrails?: A Little ?Help needed moving to and from a bed to a chair (including a wheelchair)?: A Little ?Help needed standing up from a chair using your arms (e.g., wheelchair or bedside chair)?: A Little ?Help needed to walk in hospital room?: A Little ?Help needed climbing 3-5 steps with a railing? : Total ?6 Click Score: 16 ? ?  ?End of Session Equipment Utilized During Treatment: Gait belt ?Activity Tolerance: Patient limited by fatigue ?Patient left: with call bell/phone within reach;in bed;with bed alarm set ?Nurse Communication: Mobility status ?PT Visit Diagnosis: Unsteadiness on feet (R26.81);Other abnormalities of gait and mobility (R26.89);Muscle weakness (generalized) (M62.81);History of falling (Z91.81);Difficulty in walking, not elsewhere classified (R26.2);Pain ?Pain - Right/Left: Right ?Pain - part of body: Ankle and joints of foot ?  ? ? ?Time: 6283-1517 ?PT Time Calculation (min) (ACUTE ONLY): 31 min ? ?Charges:  $Therapeutic Activity: 23-37 mins          ?          ? ?Wyona Almas, PT, DPT ?Acute Rehabilitation Services ?Pager [phone] ?Office [phone] ? ? ? ?Deno Etienne ?02/27/2022, 3:42 PM ? ?

## 2022-02-27 NOTE — Progress Notes (Signed)
?[facility] ?Progress Note  ? ?Subjective:  Yesterday with acute episode of hypotension and rigors, improved later and able to be dialyzed overnight with 2L removed. CXR was clear. Today, remains weak but BP ok without any chills/fever/rigors. Denies CP or dyspnea - on room air. ? ?Objective ?Vitals:  ? 02/27/22 0224 02/27/22 2694 02/27/22 0441 02/27/22 0748  ?BP: (!) 129/58  136/81 (!) 138/57  ?Pulse: 93  92 96  ?Resp: '20  19 20  '$ ?Temp:   98.9 ?F (37.2 ?C) 98.4 ?F (36.9 ?C)  ?TempSrc:   Oral Oral  ?SpO2: 98%  98% 97%  ?Weight:  51.9 kg    ?Height:      ? ?Physical Exam ?General: Elderly, ill appearing man, NAD - room air. ?Heart: RRR; 2/6 murmur ?Lungs: CTA anteriorly ?Abdomen: soft ?Extremities: No LE edema; R foot with wound vac, medial R leg with 5cm incision - no drainage or erythema ?Dialysis Access: L forearm AVF + thrill ? ?Additional Objective ?Labs: ?Basic Metabolic Panel: ?Recent Labs  ?Lab 02/25/22 ?0107 02/26/22 ?0431 02/27/22 ?8546  ?NA 135 132* 135  ?K 4.5 4.9 3.3*  ?CL 101 99 100  ?CO2 '27 23 27  '$ ?GLUCOSE 170* 154* 120*  ?BUN 20 45* 14  ?CREATININE 3.45* 4.99* 2.49*  ?CALCIUM 7.5* 7.6* 7.0*  ?PHOS 3.4 3.9  --   ? ?Liver Function Tests: ?Recent Labs  ?Lab 02/25/22 ?0107 02/26/22 ?0431  ?ALBUMIN 2.5* 2.5*  ? ?CBC: ?Recent Labs  ?Lab 02/23/22 ?1627 02/24/22 ?0505 02/26/22 ?2703 02/27/22 ?5009  ?WBC 9.9 10.2 11.0* 11.0*  ?HGB 9.3* 8.0* 7.6* 7.7*  ?HCT 29.7* 26.6* 24.7* 24.9*  ?MCV 96.7 97.4 99.2 96.9  ?PLT 214 198 164 162  ? ?CBG: ?Recent Labs  ?Lab 02/26/22 ?1243 02/26/22 ?1737 02/26/22 ?2132 02/27/22 ?3818 02/27/22 ?2993  ?GLUCAP 96 93 102* 69* 126*  ? ?Studies/Results: ?DG CHEST PORT 1 VIEW ? ?Result Date: 02/26/2022 ?CLINICAL DATA:  Shortness of breath EXAM: PORTABLE CHEST 1 VIEW COMPARISON:  11/26/2021 FINDINGS: Transverse diameter of heart is increased. Central pulmonary vessels are prominent. There are no signs of alveolar pulmonary edema or focal pulmonary consolidation. There is  no significant pleural effusion or pneumothorax. There is a vascular coil overlying the liver. Surgical clips are seen in mediastinum. There are metallic sutures in the sternum. IMPRESSION: Cardiomegaly. Central pulmonary vessels are prominent without signs of alveolar pulmonary edema. No new focal infiltrates are seen. Electronically Signed   By: Elmer Picker M.D.   On: 02/26/2022 14:46   ? ?Medications: ? sodium chloride    ? magnesium sulfate bolus IVPB    ? ? (feeding supplement) PROSource Plus  30 mL Oral BID BM  ? sodium chloride   Intravenous Once  ? amLODipine  5 mg Oral Daily  ? aspirin EC  81 mg Oral Daily  ? azithromycin  250 mg Oral Q M,W,F  ? calcium acetate  667 mg Oral BID WC  ? carvedilol  25 mg Oral 2 times per day on Sun Mon Wed Fri  ? And  ? carvedilol  25 mg Oral Once per day on Tue Thu Sat  ? Chlorhexidine Gluconate Cloth  6 each Topical Q0600  ? clopidogrel  75 mg Oral Daily  ? docusate sodium  100 mg Oral Daily  ? doxercalciferol  3 mcg Intravenous Q T,Th,Sa-HD  ? famotidine  20 mg Oral q AM  ? heparin  5,000 Units Subcutaneous Q8H  ? insulin aspart  0-15 Units Subcutaneous TID WC  ?  insulin aspart  0-5 Units Subcutaneous QHS  ? lanthanum  1,500 mg Oral BID WC  ? midodrine  5 mg Oral Q T,Th,Sa-HD  ? multivitamin with minerals  1 tablet Oral Daily  ? mupirocin ointment   Nasal BID  ? pantoprazole  40 mg Oral Daily  ? pravastatin  20 mg Oral QPM  ? predniSONE  5 mg Oral Daily  ? sulfamethoxazole-trimethoprim  1 tablet Oral QPM  ? tacrolimus  0.5 mg Oral BID  ? ? ?Dialysis Orders: ?[facility] ?3.5h  350/600 52 kg   2/2 bath  LFA AVF No Heparin ?- Venofer 100 mg IV  X 5 doses (3/5 doses given)  ?- Hectorol 3 mcg IV TIW ?- Mircera 100 mcg IV q 2 weeks (Last dose 02/17/2022) ?  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 Sharol Given: Per primary/Ortho.  ?PAD s/p Right femoral to below knee popliteal bypass grafting with PTFE per Dr. Trula Slade 02/23/2022. Per  primary/[facility]. ?ESRD: Continue HD on TTS schedule, no heparin. Next HD 3/28. K 3.3 this AM - but this is post-HD, watch without supplement for now, should improve with diet. ?BP/ volume: BP better today, no edema. CXR 3/25 clear. ?Anemia of ESRD: Hgb down to 8 ->7.6. Looks like was ordered for blood, but not given? ?Secondary hyperparathyroidism: CorrCa/Phos ok. Continue VDRA, calcium based binders.  ?Hx lung transplant: On prograf and prednisone. Follows with Dr. Doy Mince, [facility] transplant.  ?DMT2 ? ? ?Veneta Penton, PA-C ?02/27/2022, 10:34 AM  ?[facility] ? ? ? ?

## 2022-02-27 NOTE — Assessment & Plan Note (Addendum)
No sign of blood loss. Hb 7.7.  Transfuse prn 1U PRBC with HD ?

## 2022-02-27 NOTE — Progress Notes (Signed)
?PROGRESS NOTE ? ? ? ?SNYDER COLAVITO  JYN:[id number] DOB: March 27, 1941 DOA: 02/23/2022 ?PCP: Mikey College, MD  ? ?Brief Narrative:  ?81 year old with history of ESRD on hemodialysis, lung transplant in 2004 on immunosuppressants, HTN, bronchiectasis, DM2 presents to the hospital with complaints of diabetic foot ulcer of the right toe.  He underwent revascularization and amputation with wound VAC placement on 02/23/2022 by vascular and orthopedic.  Medical team consulted to help assist with management. ? ? ?Assessment & Plan: ? Active Problems: ?  PVD (peripheral vascular disease) (HCC) s/p femoral-popliteal bypass surgery ?  BRONCHIECTASIS ?  ESRD on hemodialysis ([place]) ?  S/P femoral-popliteal bypass surgery ?  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  Rigors ?  Type 2 diabetes mellitus with peripheral neuropathy (HCC) ?  History of lung transplant ([place]) secondary to bronchiectasis ?  Essential hypertension ?  Anemia ?  ? ? ?Assessment and Plan: ?PVD (peripheral vascular disease) ([place]) s/p femoral-popliteal bypass surgery ?Status post bypass and endarterectomy by vascular 3/22 ? ?Rigors ?Hypotension during HD on 3/25.  Isolated event.  Thereafter using been hemodynamically stable.  Troponins remain flat.  Chest x-ray and ABG are unremarkable.  Unable to obtain UA sample but it has been ordered. ?EKG showed lateral T wave depression.  We will repeat again this morning. ? 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 ?S/P femoral-popliteal bypass surgery ?Performed by vascular on 3/22 ? ?ESRD on hemodialysis ([place]) ?Hemodialysis TTS, nephrology team following ?HD planned today ? ?BRONCHIECTASIS ?Bronchodilators, I-S/flutter valve.  He is status post bilateral lung transplant on chronic immunosuppressant follows at [facility]. ? ?Anemia ?No sign of blood  loss. Hb 7.6. May benefit from 1U PRBC tx during HD today ? ?Essential hypertension ?Continue current blood pressure medication regimen. ? ? ? ?History of lung transplant ([place]) secondary to bronchiectasis ?Status post bilateral lung transplant at [facility] in 2004. ?Home medications- Prograf, prednisone, Bactrim ? ? ?Type 2 diabetes mellitus with peripheral neuropathy (HCC) ?Sliding scale and Accu-Chek ?Due to some hypoglycemia will discontinue Premeal insulin ?A1c 02/23/2022-5.8 ? ? ? ? ? ?DVT prophylaxis: Subcu heparin ?Code Status: Full code ?Family Communication:   ? ? ? ? ?Subjective: ?Seen and examined at bedside.  Tolerated dialysis early this morning.  During my visit his blood glucose was around 69 therefore getting oral juice.  Patient reported of feeling cold when uncovered him to get EKG by the technician.  No other complaints.  Per nursing staff no other acute events overnight. ? ? ?Examination: ?Constitutional: Not in acute distress ?Respiratory: Clear to auscultation bilaterally ?Cardiovascular: Normal sinus rhythm, no rubs ?Abdomen: Nontender nondistended good bowel sounds ?Musculoskeletal: Amputation site noted with wound VAC in place. ?Skin: No rashes seen ?Neurologic: CN 2-12 grossly intact.  And nonfocal ?Psychiatric: Normal judgment and insight. Alert and oriented x 3. Normal mood.  ? ? ? ?Objective: ?Vitals:  ? 02/27/22 8657 02/27/22 0224 02/27/22 0339 02/27/22 0441  ?BP: (!) 119/51 (!) 129/58  136/81  ?Pulse: 87 93  92  ?Resp: '19 20  19  '$ ?Temp:    98.9 ?F (37.2 ?C)  ?TempSrc:    Oral  ?SpO2: 98% 98%  98%  ?Weight:   51.9 kg   ?Height:      ? ? ?Intake/Output Summary (Last 24 hours) at 02/27/2022 0732 ?Last data  filed at 02/27/2022 [phone] ?Gross per 24 hour  ?Intake --  ?Output 2012 ml  ?Net -2012 ml  ? ?Filed Weights  ? 02/26/22 0404 02/26/22 2339 02/27/22 0339  ?Weight: 54.3 kg 53.9 kg 51.9 kg  ? ? ? ?Data Reviewed:  ? ?CBC: ?Recent Labs  ?Lab 02/22/22 ?0718 02/23/22 ?0622 02/23/22 ?1627 02/24/22 ?0505  02/26/22 ?0431  ?WBC  --   --  9.9 10.2 11.0*  ?HGB 11.9* 11.2* 9.3* 8.0* 7.6*  ?HCT 35.0* 33.0* 29.7* 26.6* 24.7*  ?MCV  --   --  96.7 97.4 99.2  ?PLT  --   --  214 198 164  ? ?Basic Metabolic Panel: ?Recent Labs  ?Lab 02/22/22 ?0718 02/23/22 ?0622 02/23/22 ?1627 02/24/22 ?0505 02/25/22 ?0107 02/26/22 ?0431  ?NA 140 137  --  131* 135 132*  ?K 3.9 4.5  --  5.8* 4.5 4.9  ?CL 100 102  --  98 101 99  ?CO2  --   --   --  21* 27 23  ?GLUCOSE 130* 58*  --  260* 170* 154*  ?BUN 22 29*  --  47* 20 45*  ?CREATININE 4.40* 5.90* 5.90* 6.62* 3.45* 4.99*  ?CALCIUM  --   --   --  7.8* 7.5* 7.6*  ?PHOS  --   --   --   --  3.4 3.9  ? ?GFR: ?Estimated Creatinine Clearance: 9 mL/min (A) (by C-G formula based on SCr of 4.99 mg/dL (H)). ?Liver Function Tests: ?Recent Labs  ?Lab 02/25/22 ?0107 02/26/22 ?0431  ?ALBUMIN 2.5* 2.5*  ? ?No results for input(s): LIPASE, AMYLASE in the last 168 hours. ?No results for input(s): AMMONIA in the last 168 hours. ?Coagulation Profile: ?Recent Labs  ?Lab 02/22/22 ?1338  ?INR 1.2  ? ?Cardiac Enzymes: ?No results for input(s): CKTOTAL, CKMB, CKMBINDEX, TROPONINI in the last 168 hours. ?BNP (last 3 results) ?No results for input(s): PROBNP in the last 8760 hours. ?HbA1C: ?No results for input(s): HGBA1C in the last 72 hours. ? ?CBG: ?Recent Labs  ?Lab 02/26/22 ?1145 02/26/22 ?1243 02/26/22 ?1737 02/26/22 ?2132 02/27/22 ?8295  ?GLUCAP 90 96 93 102* 69*  ? ?Lipid Profile: ?No results for input(s): CHOL, HDL, LDLCALC, TRIG, CHOLHDL, LDLDIRECT in the last 72 hours. ? ?Thyroid Function Tests: ?No results for input(s): TSH, T4TOTAL, FREET4, T3FREE, THYROIDAB in the last 72 hours. ?Anemia Panel: ?No results for input(s): VITAMINB12, FOLATE, FERRITIN, TIBC, IRON, RETICCTPCT in the last 72 hours. ?Sepsis Labs: ?No results for input(s): PROCALCITON, LATICACIDVEN in the last 168 hours. ? ?Recent Results (from the past 240 hour(s))  ?Surgical PCR Screen     Status: Abnormal  ? Collection Time: 02/22/22  1:18 PM  ?  Specimen: Nasal Mucosa; Nasal Swab  ?Result Value Ref Range Status  ? MRSA, PCR NEGATIVE NEGATIVE Final  ? Staphylococcus aureus POSITIVE (A) NEGATIVE Final  ?  Comment: (NOTE) ?The Xpert SA Assay (FDA approved for NASAL specimens in patients 17 ?years of age and older), is one component of a comprehensive ?surveillance program. It is not intended to diagnose infection nor to ?guide or monitor treatment. ?Performed at [facility], [place] [address] [place], [place] ?[address] ?  ?  ? ? ? ? ? ?Radiology Studies: ?DG CHEST PORT 1 VIEW ? ?Result Date: 02/26/2022 ?CLINICAL DATA:  Shortness of breath EXAM: PORTABLE CHEST 1 VIEW COMPARISON:  11/26/2021 FINDINGS: Transverse diameter of heart is increased. Central pulmonary vessels are prominent. There are no signs of alveolar pulmonary edema or focal  pulmonary consolidation. There is no significant pleural effusion or pneumothorax. There is a vascular coil overlying the liver. Surgical clips are seen in mediastinum. There are metallic sutures in the sternum. IMPRESSION: Cardiomegaly. Central pulmonary vessels are prominent without signs of alveolar pulmonary edema. No new focal infiltrates are seen. Electronically Signed   By: Elmer Picker M.D.   On: 02/26/2022 14:46   ? ? ? ? ? ?Scheduled Meds: ? (feeding supplement) PROSource Plus  30 mL Oral BID BM  ? sodium chloride   Intravenous Once  ? amLODipine  5 mg Oral Daily  ? aspirin EC  81 mg Oral Daily  ? azithromycin  250 mg Oral Q M,W,F  ? calcium acetate  667 mg Oral BID WC  ? carvedilol  25 mg Oral 2 times per day on Sun Mon Wed Fri  ? And  ? carvedilol  25 mg Oral Once per day on Tue Thu Sat  ? Chlorhexidine Gluconate Cloth  6 each Topical Q0600  ? clopidogrel  75 mg Oral Daily  ? docusate sodium  100 mg Oral Daily  ? doxercalciferol  3 mcg Intravenous Q T,Th,Sa-HD  ? famotidine  20 mg Oral q AM  ? heparin  5,000 Units Subcutaneous Q8H  ? insulin aspart  0-15 Units Subcutaneous TID WC  ? insulin aspart  0-5  Units Subcutaneous QHS  ? lanthanum  1,500 mg Oral BID WC  ? midodrine  5 mg Oral Q T,Th,Sa-HD  ? multivitamin with minerals  1 tablet Oral Daily  ? mupirocin ointment   Nasal BID  ? pantoprazole  40 m

## 2022-02-27 NOTE — Progress Notes (Signed)
Concern for prograf potentially contributing to pt's confusion, pharmacy called to notify us that lab needed to determine levels take a long time to result and to defer to MD for administration instructions.  As per Dr. Reesa Chew, we are to hold today and resume tomorrow. ?

## 2022-02-27 NOTE — Progress Notes (Addendum)
[facility] [place] ? ?Subjective  - More alert and oriented today  ? ? ?Objective ?(!) 138/57 ?96 ?98.4 ?F (36.9 ?C) (Oral) ?20 ?97% ? ?Intake/Output Summary (Last 24 hours) at 02/27/2022 0853 ?Last data filed at 02/27/2022 0442 ?Gross per 24 hour  ?Intake --  ?Output 2012 ml  ?Net -2012 ml  ? ? ?Right groin soft healing well ?Right ankle with doppler AT/PT signal ?Vac to suction on foot ?Lungs non labored breathing, speech clear ?Heart RRR ? ?Assessment/Planning: ?POD # 4 ?81 y.o. male is s/p  ?1.  Right common femoral endarterectomy with profundoplasty and bovine pericardial patch angioplasty ?2.  Right common femoral artery to below-knee popliteal artery bypass with 6 mm ringed PTFE ?3.  Right lower extremity arteriogram with runoff ? ?Improved mentation this am. ?Had HD last night, transfused 1 unit PRBC 02/26/22 for acute on chronic anemia ?Hypokalemia 3.3.  Will observe for now.  HGB 7.7 will redraw labs tomorrow.   ?BP stable systolic 100'F ?Afebrile leukocytosis 11.0 ?Dr. Sharol Given following amputation site. ? ?Roxy Horseman ?02/27/2022 ?8:53 AM ?-- ?VASCULAR STAFF ADDENDUM: ?I have independently interviewed and examined the patient. ?I agree with the above.  ? ? ?J. Melene Muller, MD ?[facility] ?Office Phone Number: [phone] ?02/27/2022 10:20 AM ? ? ? ?Laboratory ?Lab Results: ?Recent Labs  ?  02/26/22 ?0431 02/27/22 ?5498  ?WBC 11.0* 11.0*  ?HGB 7.6* 7.7*  ?HCT 24.7* 24.9*  ?PLT 164 162  ? ?BMET ?Recent Labs  ?  02/25/22 ?0107 02/26/22 ?0431  ?NA 135 132*  ?K 4.5 4.9  ?CL 101 99  ?CO2 27 23  ?GLUCOSE 170* 154*  ?BUN 20 45*  ?CREATININE 3.45* 4.99*  ?CALCIUM 7.5* 7.6*  ? ? ?COAG ?Lab Results  ?Component Value Date  ? INR 1.2 02/22/2022  ? INR 1.4 (H) 01/19/2011  ? INR 2.57 (H) 01/13/2011  ? ?No results found for: PTT ? ? ? ?

## 2022-02-28 DIAGNOSIS — M869 Osteomyelitis, unspecified: Secondary | ICD-10-CM | POA: Diagnosis not present

## 2022-02-28 DIAGNOSIS — I739 Peripheral vascular disease, unspecified: Secondary | ICD-10-CM | POA: Diagnosis not present

## 2022-02-28 DIAGNOSIS — N186 End stage renal disease: Secondary | ICD-10-CM | POA: Diagnosis not present

## 2022-02-28 DIAGNOSIS — D649 Anemia, unspecified: Secondary | ICD-10-CM | POA: Diagnosis not present

## 2022-02-28 LAB — GLUCOSE, CAPILLARY
Glucose-Capillary: 235 mg/dL — ABNORMAL HIGH (ref 70–99)
Glucose-Capillary: 83 mg/dL (ref 70–99)
Glucose-Capillary: 151 mg/dL — ABNORMAL HIGH (ref 70–99)
Glucose-Capillary: 202 mg/dL — ABNORMAL HIGH (ref 70–99)
Glucose-Capillary: 260 mg/dL — ABNORMAL HIGH (ref 70–99)

## 2022-02-28 MED ORDER — FAMOTIDINE 20 MG PO TABS
20.0000 mg | ORAL_TABLET | Freq: Every day | ORAL | Status: DC
Start: 2022-03-01 — End: 2022-03-02
  Administered 2022-03-01: 20 mg via ORAL
  Filled 2022-02-28: qty 1

## 2022-02-28 MED ORDER — MIDODRINE HCL 5 MG PO TABS
5.0000 mg | ORAL_TABLET | ORAL | Status: DC
Start: 2022-03-01 — End: 2022-03-02
  Filled 2022-02-28: qty 1

## 2022-02-28 NOTE — Progress Notes (Signed)
Mobility Specialist: Progress Note ? ? 02/28/22 1657  ?Mobility  ?Activity Ambulated with assistance in hallway  ?Level of Assistance Modified independent, requires aide device or extra time  ?Assistive Device Front wheel walker  ?RLE Weight Bearing PWB  ?RLE Partial Weight Bearing Percentage or Pounds Partial WB - Heel only  ?Distance Ambulated (ft) 200 ft  ?Activity Response Tolerated well  ?$Mobility charge 1 Mobility  ? ?During-Mobility: 77 HR ?Post-Mobility: 70 HR ? ?Pt received in bed and agreeable to ambulation. Needed assistance donning Darco shoe. C/o 5/10 pain in his R knee, otherwise asymptomatic. Pt back to bed after walk with call bell and phone in reach.  ? ?Harrell Gave Sheyla Zaffino ?Mobility Specialist ?Mobility Specialist [place]: [phone] ?Mobility Specialist [place]: [phone] ? ?

## 2022-02-28 NOTE — Progress Notes (Signed)
Patient did not show any signs of confusion today all day ? ?even remembered I had a Stage manager with me on Friday. Very impressed ? ?Patient had done excellent today. ? ?Patient refused evening dose of Coreg in fears of getting los BP at dialysis again and not being able to stay on schedule. ? ?Patient Refusal Charted in [facility] ?

## 2022-02-28 NOTE — Care Management Important Message (Signed)
Important Message ? ?Patient Details  ?Name: Tyler Mueller ?MRN: [id number] ?Date of Birth: 1941-10-03 ? ? ?Medicare Important Message Given:  Yes ? ? ? ? ?Kora Groom ?02/28/2022, 4:04 PM ?

## 2022-02-28 NOTE — Progress Notes (Addendum)
?  Progress Note ? ? ? ?02/28/2022 ?7:48 AM ?5 Days Post-Op ? ?Subjective:  feeling better today.  Wants to go home soon ? ? ?Vitals:  ? 02/28/22 0433 02/28/22 0737  ?BP: (!) 134/45 (!) 139/51  ?Pulse: 72 76  ?Resp: 17 17  ?Temp: 97.8 ?F (36.6 ?C) 97.9 ?F (36.6 ?C)  ?SpO2: 100% 100%  ? ?Physical Exam: ?Lungs:  non labored ?Incisions:  R groin and popliteal incision c/d/i ?Extremities:  ATA by doppler; dressing left in place R foot ?Neurologic: A&O ? ?CBC ?   ?Component Value Date/Time  ? WBC 11.0 (H) 02/27/2022 0752  ? RBC 2.57 (L) 02/27/2022 0752  ? HGB 7.7 (L) 02/27/2022 0752  ? HCT 24.9 (L) 02/27/2022 0752  ? PLT 162 02/27/2022 0752  ? MCV 96.9 02/27/2022 0752  ? MCH 30.0 02/27/2022 0752  ? MCHC 30.9 02/27/2022 0752  ? RDW 19.9 (H) 02/27/2022 0752  ? LYMPHSABS 1.1 02/04/2022 1442  ? MONOABS 0.6 02/04/2022 1442  ? EOSABS 0.1 02/04/2022 1442  ? BASOSABS 0.1 02/04/2022 1442  ? ? ?BMET ?   ?Component Value Date/Time  ? NA 135 02/27/2022 0752  ? K 3.3 (L) 02/27/2022 0752  ? CL 100 02/27/2022 0752  ? CO2 27 02/27/2022 0752  ? GLUCOSE 120 (H) 02/27/2022 0752  ? GLUCOSE 104 (H) 10/31/2006 0755  ? BUN 14 02/27/2022 0752  ? CREATININE 2.49 (H) 02/27/2022 0752  ? CALCIUM 7.0 (L) 02/27/2022 0752  ? CALCIUM 8.5 05/09/2011 1339  ? GFRNONAA 26 (L) 02/27/2022 0752  ? GFRAA 6 (L) 07/16/2015 2330  ? ? ?INR ?   ?Component Value Date/Time  ? INR 1.2 02/22/2022 1338  ? ? ?No intake or output data in the 24 hours ending 02/28/22 0748 ? ? ?Assessment/Plan:  81 y.o. male is s/p R CFA endarterectomy and femoral to popliteal bypass with PTFE 5 Days Post-Op  ? ?R foot well perfused with ATA by doppler ?Dr. Sharol Given managing R foot wound ?ESRD on HD, next treatment tomorrow per Nephrology ?Home with home health PT when cleared by medicine ? ? ?Dagoberto Ligas, PA-C ?[facility] ?[phone] ?02/28/2022 ?7:48 AM ? ?I agree with the above.  I have seen and evaluated the patient.  He is postop day #5 from a right femoral-popliteal  bypass graft and toe amputation.  He is doing well from a vascular perspective with anterior tibial Doppler signal. ? ?Annamarie Major ? ?

## 2022-02-28 NOTE — Progress Notes (Signed)
Occupational Therapy Treatment ?Patient Details ?Name: Tyler Mueller ?MRN: [id number] ?DOB: Apr 01, 1941 ?Today's Date: 02/28/2022 ? ? ?History of present illness 81 y/o male who was admitted 02/23/22 with gangrene on R 3rd toe. S/p abdominal aortogram w/ RLE and peripheral vascular balloon angioplasty (right) on 02/22/22. S/p Right femoral to below-knee popliteal artery bypass graft and Right common femoral and profundofemoral endarterectomy as well as amputation of the third ray with wound vac on 02/23/22. PMH includes Diabetes mellitus without complication, Diverticulitis, ESRD on hemodialysis (T, Th, Sat), History of lung transplant (04/21/2014; 12/05/2002), HTN, and Skin cancer of face, Cataract extraction; Appendectomy. Recently admitted in December 2022 due to flu, frequent falls. ?  ?OT comments ? Pt progressing towards established OT goals and demonstrating increased arousal and activity tolerance compared to yesterday session. Pt and wife having donned underwear prior to OT arrival; discussing compensatory techniques and wound vac management; both verbalized understanding. Pt performing functional mobility in hallway with Supervision and RW. Recommend dc to home with HHOT. Will continue to follow acutely as admitted.   ? ?Recommendations for follow up therapy are one component of a multi-disciplinary discharge planning process, led by the attending physician.  Recommendations may be updated based on patient status, additional functional criteria and insurance authorization. ?   ?Follow Up Recommendations ? Home health OT  ?  ?Assistance Recommended at Discharge Frequent or constant Supervision/Assistance  ?Patient can return home with the following ? A lot of help with walking and/or transfers;A lot of help with bathing/dressing/bathroom ?  ?Equipment Recommendations ? None recommended by OT  ?  ?Recommendations for Other Services PT consult ? ?  ?Precautions / Restrictions Precautions ?Precautions:  Fall ?Precaution Comments: wound vac R foot ?Required Braces or Orthoses: Other Brace ?Other Brace: Darco shoe in room ?Restrictions ?Weight Bearing Restrictions: Yes ?RLE Weight Bearing: Partial weight bearing ?RLE Partial Weight Bearing Percentage or Pounds: Partial WB - Heel only ?Other Position/Activity Restrictions: wound vac RLE  ? ? ?  ? ?Mobility Bed Mobility ?Overal bed mobility: Needs Assistance ?Bed Mobility: Supine to Sit ?  ?  ?Supine to sit: Supervision, HOB elevated ?Sit to supine: Supervision, HOB elevated ?  ?General bed mobility comments: Able to get to EOB without assist with increased time. ?  ? ?Transfers ?Overall transfer level: Needs assistance ?Equipment used: Rolling walker (2 wheels) ?Transfers: Sit to/from Stand ?Sit to Stand: Supervision ?  ?  ?  ?  ?  ?General transfer comment: Supervision for safety ?  ?  ?Balance Overall balance assessment: Needs assistance ?Sitting-balance support: No upper extremity supported, Feet supported ?Sitting balance-Leahy Scale: Fair ?  ?  ?Standing balance support: During functional activity, Reliant on assistive device for balance ?Standing balance-Leahy Scale: Fair ?  ?  ?  ?  ?  ?  ?  ?  ?  ?  ?  ?  ?   ? ?ADL either performed or assessed with clinical judgement  ? ?ADL Overall ADL's : Needs assistance/impaired ?  ?  ?  ?  ?  ?  ?  ?  ?  ?  ?  ?Lower Body Dressing Details (indicate cue type and reason): Pt and wife having donned underwear prior to arrival. Discussed compensatory tehcniques and management of wound vac ?Toilet Transfer: Ambulation;Rolling walker (2 wheels);BSC/3in1;Supervision/safety;Grab bars (BSC over toilet) ?Toilet Transfer Details (indicate cue type and reason): Supervision for safety ?  ?  ?  ?  ?Functional mobility during ADLs: Supervision/safety;Rolling walker (2 wheels) ?General ADL Comments: Pt  performing functional mobiltiy in hallway and htne transfer to [facility] over toilet ?  ? ?Extremity/Trunk Assessment Upper Extremity  Assessment ?Upper Extremity Assessment: Generalized weakness ?  ?Lower Extremity Assessment ?Lower Extremity Assessment: Defer to PT evaluation ?  ?  ?  ? ?Vision   ?  ?  ?Perception   ?  ?Praxis   ?  ? ?Cognition Arousal/Alertness: Awake/alert ?Behavior During Therapy: [facility] for tasks assessed/performed ?Overall Cognitive Status: Impaired/Different from baseline ?Area of Impairment: Following commands, Memory, Awareness, Problem solving, Safety/judgement ?  ?  ?  ?  ?  ?  ?  ?  ?  ?  ?Memory: Decreased short-term memory ?Following Commands: Follows one step commands with increased time, Follows one step commands consistently ?Safety/Judgement: Decreased awareness of safety ?Awareness: Intellectual ?Problem Solving: Slow processing, Requires verbal cues, Requires tactile cues ?General Comments: Pt with increased arousal and following of commands compared to prior OT session. Upon arrival, pt sitting at EOB and was going ot walk with his wife. They had gotten dressed and he unplugged his wound vac. During session, pt requiirng increased time and cues. Feel he is closer to his baseline ?  ?  ?   ?Exercises   ? ?  ?Shoulder Instructions   ? ? ?  ?General Comments wife present  ? ? ?Pertinent Vitals/ Pain       Pain Assessment ?Pain Assessment: Faces ?Faces Pain Scale: Hurts little more ?Pain Location: R foot and leg ?Pain Descriptors / Indicators: Operative site guarding, Sore ?Pain Intervention(s): Monitored during session, Repositioned ? ?Home Living   ?  ?  ?  ?  ?  ?  ?  ?  ?  ?  ?  ?  ?  ?  ?  ?  ?  ?  ? ?  ?Prior Functioning/Environment    ?  ?  ?  ?   ? ?Frequency ? Min 2X/week  ? ? ? ? ?  ?Progress Toward Goals ? ?OT Goals(current goals can now be found in the care plan section) ? Progress towards OT goals: Progressing toward goals ? ?Acute Rehab OT Goals ?OT Goal Formulation: With patient ?Time For Goal Achievement: 03/10/22 ?Potential to Achieve Goals: Good ?ADL Goals ?Pt Will Perform Lower Body Dressing: with  modified independence;sit to/from stand ?Pt Will Transfer to Toilet: with modified independence;ambulating;regular height toilet ?Pt Will Perform Toileting - Clothing Manipulation and hygiene: with modified independence;sitting/lateral leans;sit to/from stand ?Pt Will Perform Tub/Shower Transfer: Shower transfer;with modified independence;ambulating;shower seat;rolling walker  ?Plan Discharge plan needs to be updated   ? ?Co-evaluation ? ? ?   ?  ?  ?  ?  ? ?  ?AM-PAC OT "6 Clicks" Daily Activity     ?Outcome Measure ? ? Help from another person eating meals?: None ?Help from another person taking care of personal grooming?: A Little ?Help from another person toileting, which includes using toliet, bedpan, or urinal?: A Little ?Help from another person bathing (including washing, rinsing, drying)?: A Lot ?Help from another person to put on and taking off regular upper body clothing?: A Little ?Help from another person to put on and taking off regular lower body clothing?: A Lot ?6 Click Score: 17 ? ?  ?End of Session Equipment Utilized During Treatment: Rolling walker (2 wheels);Gait belt ? ?OT Visit Diagnosis: Other abnormalities of gait and mobility (R26.89);Muscle weakness (generalized) (M62.81);Unsteadiness on feet (R26.81) ?  ?Activity Tolerance Patient tolerated treatment well ?  ?Patient Left in chair;with call bell/phone within  reach;with chair alarm set;with family/visitor present ?  ?Nurse Communication Mobility status ?  ? ?   ? ?Time: 8867-7373 ?OT Time Calculation (min): 14 min ? ?Charges: OT General Charges ?$OT Visit: 1 Visit ?OT Treatments ?$Self Care/Home Management : 8-22 mins ? ?Mathayus Stanbery MSOT, OTR/L ?Acute Rehab ?Pager: [phone] ?Office: [phone] ? ?Tevyn Codd M Mckenzye Cutright ?02/28/2022, 3:40 PM ? ? ?

## 2022-02-28 NOTE — Progress Notes (Signed)
?[facility] ?Progress Note  ? ?Subjective:   ?No c/o this AM ? ?Objective ?Vitals:  ? 02/27/22 2002 02/28/22 0300 02/28/22 0433 02/28/22 0737  ?BP: (!) 114/51  (!) 134/45 (!) 139/51  ?Pulse: 71 69 72 76  ?Resp: '13 16 17 17  '$ ?Temp: 97.8 ?F (36.6 ?C)  97.8 ?F (36.6 ?C) 97.9 ?F (36.6 ?C)  ?TempSrc: Oral  Oral Oral  ?SpO2: 100% 100% 100% 100%  ?Weight:      ?Height:      ? ?Physical Exam ?General: Elderly, chronically ill appearing man, NAD - room air. ?Heart: RRR; 2/6 murmur ?Lungs: CTA anteriorly ?Abdomen: soft ?Extremities: No LE edema; R foot with wound vac,  ?Dialysis Access: L forearm AVF + thrill ? ?Additional Objective ?Labs: ?Basic Metabolic Panel: ?Recent Labs  ?Lab 02/25/22 ?0107 02/26/22 ?0431 02/27/22 ?6160  ?NA 135 132* 135  ?K 4.5 4.9 3.3*  ?CL 101 99 100  ?CO2 '27 23 27  '$ ?GLUCOSE 170* 154* 120*  ?BUN 20 45* 14  ?CREATININE 3.45* 4.99* 2.49*  ?CALCIUM 7.5* 7.6* 7.0*  ?PHOS 3.4 3.9  --   ? ? ?Liver Function Tests: ?Recent Labs  ?Lab 02/25/22 ?0107 02/26/22 ?0431  ?ALBUMIN 2.5* 2.5*  ? ? ?CBC: ?Recent Labs  ?Lab 02/23/22 ?1627 02/24/22 ?0505 02/26/22 ?7371 02/27/22 ?0626  ?WBC 9.9 10.2 11.0* 11.0*  ?HGB 9.3* 8.0* 7.6* 7.7*  ?HCT 29.7* 26.6* 24.7* 24.9*  ?MCV 96.7 97.4 99.2 96.9  ?PLT 214 198 164 162  ? ? ?CBG: ?Recent Labs  ?Lab 02/27/22 ?1153 02/27/22 ?1645 02/27/22 ?2019 02/27/22 ?2144 02/28/22 ?9485  ?GLUCAP 92 164* 235* 138* 83  ? ? ?Studies/Results: ?DG CHEST PORT 1 VIEW ? ?Result Date: 02/26/2022 ?CLINICAL DATA:  Shortness of breath EXAM: PORTABLE CHEST 1 VIEW COMPARISON:  11/26/2021 FINDINGS: Transverse diameter of heart is increased. Central pulmonary vessels are prominent. There are no signs of alveolar pulmonary edema or focal pulmonary consolidation. There is no significant pleural effusion or pneumothorax. There is a vascular coil overlying the liver. Surgical clips are seen in mediastinum. There are metallic sutures in the sternum. IMPRESSION: Cardiomegaly. Central pulmonary  vessels are prominent without signs of alveolar pulmonary edema. No new focal infiltrates are seen. Electronically Signed   By: Elmer Picker M.D.   On: 02/26/2022 14:46   ? ?Medications: ? sodium chloride    ? magnesium sulfate bolus IVPB    ? ? (feeding supplement) PROSource Plus  30 mL Oral BID BM  ? sodium chloride   Intravenous Once  ? amLODipine  5 mg Oral Daily  ? aspirin EC  81 mg Oral Daily  ? azithromycin  250 mg Oral Q M,W,F  ? calcium acetate  667 mg Oral BID WC  ? carvedilol  25 mg Oral 2 times per day on Sun Mon Wed Fri  ? And  ? carvedilol  25 mg Oral Once per day on Tue Thu Sat  ? Chlorhexidine Gluconate Cloth  6 each Topical Q0600  ? clopidogrel  75 mg Oral Daily  ? docusate sodium  100 mg Oral Daily  ? doxercalciferol  3 mcg Intravenous Q T,Th,Sa-HD  ? famotidine  20 mg Oral q AM  ? heparin  5,000 Units Subcutaneous Q8H  ? insulin aspart  0-15 Units Subcutaneous TID WC  ? insulin aspart  0-5 Units Subcutaneous QHS  ? lanthanum  1,500 mg Oral BID WC  ? midodrine  5 mg Oral Q T,Th,Sa-HD  ? multivitamin with minerals  1 tablet Oral  Daily  ? mupirocin ointment   Nasal BID  ? pantoprazole  40 mg Oral Daily  ? pravastatin  20 mg Oral QPM  ? predniSONE  5 mg Oral Daily  ? sulfamethoxazole-trimethoprim  1 tablet Oral QPM  ? tacrolimus  0.5 mg Oral BID  ? ? ?Dialysis Orders: ?[facility] ?3.5h  350/600 52 kg   2/2 bath  LFA AVF No Heparin ?- Venofer 100 mg IV  X 5 doses (3/5 doses given)  ?- Hectorol 3 mcg IV TIW ?- Mircera 100 mcg IV q 2 weeks (Last dose 02/17/2022) ?  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 Sharol Given: Per primary/Ortho.  ?PAD s/p Right femoral to below knee popliteal bypass grafting with PTFE per Dr. Trula Slade 02/23/2022. Per primary/[facility]. ?ESRD: Continue HD on TTS schedule, no heparin. Next HD 3/28. 2K AVF no heparin. 350/600.  1-2L UF  ?BP/ volume: BP stable today, no edema.  ?Anemia of ESRD: Hgb stable 7.7. Due for ESA 3/30; transfuse as needed ?Secondary  hyperparathyroidism: CorrCa/Phos ok. Continue VDRA, calcium based binders.  ?Hx lung transplant: On prograf and prednisone. Follows with Dr. Doy Mince, [facility] transplant.  ?DMT2 ? ?Rexene Agent, MD  ?02/28/2022, 10:19 AM  ?[facility] ? ? ? ?

## 2022-02-28 NOTE — Progress Notes (Signed)
Physical Therapy Treatment ?Patient Details ?Name: Tyler Mueller ?MRN: [id number] ?DOB: [birth year] ?Today's Date: 02/28/2022 ? ? ?History of Present Illness 81 y/o male who was admitted 02/23/22 with gangrene on R 3rd toe. S/p abdominal aortogram w/ RLE and peripheral vascular balloon angioplasty (right) on 02/22/22. S/p Right femoral to below-knee popliteal artery bypass graft and Right common femoral and profundofemoral endarterectomy as well as amputation of the third ray with wound vac on 02/23/22. PMH includes Diabetes mellitus without complication, Diverticulitis, ESRD on hemodialysis (T, Th, Sat), History of lung transplant (04/21/2014; 12/05/2002), HTN, and Skin cancer of face, Cataract extraction; Appendectomy. Recently admitted in December 2022 due to flu, frequent falls. ? ?  ?PT Comments  ? ? Patient progressing well towards PT goals. Cognition seems to be improved today. Tolerated transfers and gait training with Min guard assist and use of RW for support. Pt reports he does not recall the last few days. Able to assist with donning/doffing Darco shoe leaning outside BoS without LOB. Reports he could sleep for 24 hours. Will plan for stair training next session as tolerated. Appropriate for HHPT with support of spouse. Will follow. ?   ?Recommendations for follow up therapy are one component of a multi-disciplinary discharge planning process, led by the attending physician.  Recommendations may be updated based on patient status, additional functional criteria and insurance authorization. ? ?Follow Up Recommendations ? Home health PT ?  ?  ?Assistance Recommended at Discharge    ?Patient can return home with the following A little help with bathing/dressing/bathroom;Assistance with cooking/housework;Assist for transportation;Help with stairs or ramp for entrance;A little help with walking and/or transfers ?  ?Equipment Recommendations ? None recommended by PT  ?  ?Recommendations for Other Services   ? ? ?   ?Precautions / Restrictions Precautions ?Precautions: Fall ?Precaution Comments: wound vac R foot ?Required Braces or Orthoses: Other Brace ?Other Brace: Darco shoe in room ?Restrictions ?Weight Bearing Restrictions: Yes ?RLE Weight Bearing: Partial weight bearing ?RLE Partial Weight Bearing Percentage or Pounds: Partial WB - Heel only  ?  ? ?Mobility ? Bed Mobility ?Overal bed mobility: Needs Assistance ?Bed Mobility: Supine to Sit ?  ?  ?Supine to sit: Supervision, HOB elevated ?Sit to supine: Supervision, HOB elevated ?  ?General bed mobility comments: Able to get to EOB without assist with increased time. ?  ? ?Transfers ?Overall transfer level: Needs assistance ?Equipment used: Rolling walker (2 wheels) ?Transfers: Sit to/from Stand ?Sit to Stand: Min guard ?  ?  ?  ?  ?  ?General transfer comment: Min guard for safety. Stood from [employer]. ?  ? ?Ambulation/Gait ?Ambulation/Gait assistance: Min guard ?Gait Distance (Feet): 75 Feet ?Assistive device: Rolling walker (2 wheels) ?Gait Pattern/deviations: Antalgic, Step-through pattern, Decreased stance time - right, Decreased step length - left, Trunk flexed ?Gait velocity: reduced ?Gait velocity interpretation: <1.31 ft/sec, indicative of household ambulator ?  ?General Gait Details: Slow, mostly steady gait with Darco shoe and left shoe donned. No evidence of imbalance. ? ? ?Stairs ?  ?  ?  ?  ?  ? ? ?Wheelchair Mobility ?  ? ?Modified Rankin (Stroke Patients Only) ?  ? ? ?  ?Balance Overall balance assessment: Needs assistance ?Sitting-balance support: No upper extremity supported, Feet supported ?Sitting balance-Leahy Scale: Fair ?Sitting balance - Comments: Able to reach outside boS and doff darco shoe/regular shoe without LOB. ?  ?Standing balance support: During functional activity, Reliant on assistive device for balance ?Standing balance-Leahy Scale: Poor ?Standing balance comment:  Reliant on BUE support ?  ?  ?  ?  ?  ?  ?  ?  ?  ?  ?  ?  ? ?  ?Cognition  Arousal/Alertness: Awake/alert ?Behavior During Therapy: [facility] for tasks assessed/performed ?Overall Cognitive Status: Impaired/Different from baseline ?Area of Impairment: Orientation, Following commands, Memory ?  ?  ?  ?  ?  ?  ?  ?  ?Orientation Level: Disoriented to, Time ?  ?Memory: Decreased short-term memory ?Following Commands: Follows one step commands with increased time, Follows one step commands consistently ?  ?  ?  ?General Comments: States it is April, "I would not know the date before this." Able to problem solve how to find it on the wall. Cognition seems improved, Reports not remembering much about hospitalization. ?  ?  ? ?  ?Exercises   ? ?  ?General Comments General comments (skin integrity, edema, etc.): VSS on RA> ?  ?  ? ?Pertinent Vitals/Pain Pain Assessment ?Pain Assessment: Faces ?Faces Pain Scale: Hurts little more ?Pain Location: R foot and leg ?Pain Descriptors / Indicators: Operative site guarding, Sore ?Pain Intervention(s): Monitored during session, Repositioned  ? ? ?Home Living   ?  ?  ?  ?  ?  ?  ?  ?  ?  ?   ?  ?Prior Function    ?  ?  ?   ? ?PT Goals (current goals can now be found in the care plan section) Progress towards PT goals: Progressing toward goals ? ?  ?Frequency ? ? ? Min 3X/week ? ? ? ?  ?PT Plan Current plan remains appropriate (improvement and change in rec)  ? ? ?Co-evaluation   ?  ?  ?  ?  ? ?  ?AM-PAC PT "6 Clicks" Mobility   ?Outcome Measure ? Help needed turning from your back to your side while in a flat bed without using bedrails?: None ?Help needed moving from lying on your back to sitting on the side of a flat bed without using bedrails?: None ?Help needed moving to and from a bed to a chair (including a wheelchair)?: A Little ?Help needed standing up from a chair using your arms (e.g., wheelchair or bedside chair)?: A Little ?Help needed to walk in hospital room?: A Little ?Help needed climbing 3-5 steps with a railing? : A Lot ?6 Click Score: 19 ? ?   ?End of Session Equipment Utilized During Treatment: Gait belt ?Activity Tolerance: Patient tolerated treatment well ?Patient left: in bed;with call bell/phone within reach;with bed alarm set ?Nurse Communication: Mobility status ?PT Visit Diagnosis: Unsteadiness on feet (R26.81);Other abnormalities of gait and mobility (R26.89);Muscle weakness (generalized) (M62.81);History of falling (Z91.81);Difficulty in walking, not elsewhere classified (R26.2);Pain ?Pain - Right/Left: Right ?Pain - part of body: Ankle and joints of foot ?  ? ? ?Time: 5400-8676 ?PT Time Calculation (min) (ACUTE ONLY): 22 min ? ?Charges:  $Gait Training: 8-22 mins          ?          ? ?Marisa Severin, PT, DPT ?Acute Rehabilitation Services ?Pager [phone] ?Office [phone] ? ? ? ? ? ?[place] ?02/28/2022, 12:51 PM ? ?

## 2022-02-28 NOTE — Progress Notes (Signed)
?PROGRESS NOTE ? ? ? ?TAFARI HUMISTON  KXF:[id number] DOB: [birth year] DOA: 02/23/2022 ?PCP: Mikey College, MD  ? ?Brief Narrative:  ?81 year old with history of ESRD on hemodialysis, lung transplant in 2004 on immunosuppressants, HTN, bronchiectasis, DM2 presents to the hospital with complaints of diabetic foot ulcer of the right toe.  He underwent revascularization and amputation with wound VAC placement on 02/23/2022 by vascular and orthopedic.  Medical team consulted to help assist with management.  Hospitalization was complicated by 1 episode of hypotension/rigors during hemodialysis which resolved thereafter.  He also had transient altered mental status/acute metabolic encephalopathy which resolved after hemodialysis. ? ? ?Assessment & Plan: ? Active Problems: ?  PVD (peripheral vascular disease) (HCC) s/p femoral-popliteal bypass surgery ?  BRONCHIECTASIS ?  ESRD on hemodialysis ([place]) ?  S/P femoral-popliteal bypass surgery ?  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  Rigors ?  Type 2 diabetes mellitus with peripheral neuropathy (HCC) ?  History of lung transplant ([place]) secondary to bronchiectasis ?  Essential hypertension ?  Anemia ?  ? ? ?Assessment and Plan: ?PVD (peripheral vascular disease) ([place]) s/p femoral-popliteal bypass surgery ?Status post bypass and endarterectomy by vascular 3/22 ? ?Rigors ?Hypotension during HD on 3/25.  Isolated event.  Thereafter using been hemodynamically stable.  Troponins remain flat.  Chest x-ray and ABG are unremarkable.  Resolved no obvious evidence of infection.  EKG shows lateral T wave depression but without any chest pain.  Continue to follow-up outpatient. ? 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 ?S/P femoral-popliteal bypass surgery ?Performed by vascular on 3/22 ? ?ESRD on hemodialysis  ([place]) ?Hemodialysis TTS, nephrology team following ?HD per nephrology ? ?BRONCHIECTASIS ?Bronchodilators, I-S/flutter valve.  He is status post bilateral lung transplant on chronic immunosuppressant follows at [facility]. ?Tacrolimus levels have been sent but pending ? ?Anemia ?No sign of blood loss. Hb 7.6.  Will benefit from 1 more unit of PRBC during HD if he stays here until tomorrow. ? ?Essential hypertension ?Continue current blood pressure medication regimen. ? ? ? ?History of lung transplant ([place]) secondary to bronchiectasis ?Status post bilateral lung transplant at [facility] in 2004. ?Home medications- Prograf, prednisone, Bactrim ? ? ?Type 2 diabetes mellitus with peripheral neuropathy (HCC) ?Sliding scale and Accu-Chek ?Due to some hypoglycemia will discontinue Premeal insulin ?A1c 02/23/2022-5.8 ? ? ? ? ? ?DVT prophylaxis: Subcu heparin ?Code Status: Full code ?Family Communication:   ? ?Patient is medically doing well and cleared for discharge from my standpoint with close outpatient follow-up with PCP in 1 week.  If he were to stay another day I would recommend 1 unit PRBC transfusion during hemodialysis tomorrow.  CBC in next 5 to 7 days upon discharge. ? ? ?Subjective: ?Mentation back to baseline this morning, does not have any complaints. ? ? ?Examination: ?Constitutional: Not in acute distress ?Respiratory: Clear to auscultation bilaterally ?Cardiovascular: Normal sinus rhythm, no rubs ?Abdomen: Nontender nondistended good bowel sounds ?Musculoskeletal: Amputation site noted with wound VAC in place. ?Skin: No rashes seen ?Neurologic: CN 2-12 grossly intact.  And nonfocal ?Psychiatric: Normal judgment and insight. Alert and oriented x 3. Normal mood.  ? ? ? ?Objective: ?Vitals:  ? 02/27/22 2002 02/28/22 0300 02/28/22 0433 02/28/22 0737  ?BP: (!) 114/51  (!) 134/45 (!) 139/51  ?Pulse: 71 69 72 76  ?Resp: 13  $'16 17 17  'i$ ?Temp: 97.8 ?F (36.6 ?C)  97.8 ?F (36.6 ?C) 97.9 ?F (36.6 ?C)  ?TempSrc: Oral  Oral Oral  ?SpO2:  100% 100% 100% 100%  ?Weight:      ?Height:      ? ?No intake or output data in the 24 hours ending 02/28/22 1146 ? ?Filed Weights  ? 02/26/22 0404 02/26/22 2339 02/27/22 0339  ?Weight: 54.3 kg 53.9 kg 51.9 kg  ? ? ? ?Data Reviewed:  ? ?CBC: ?Recent Labs  ?Lab 02/23/22 ?0622 02/23/22 ?1627 02/24/22 ?0505 02/26/22 ?1517 02/27/22 ?6160  ?WBC  --  9.9 10.2 11.0* 11.0*  ?HGB 11.2* 9.3* 8.0* 7.6* 7.7*  ?HCT 33.0* 29.7* 26.6* 24.7* 24.9*  ?MCV  --  96.7 97.4 99.2 96.9  ?PLT  --  214 198 164 162  ? ?Basic Metabolic Panel: ?Recent Labs  ?Lab 02/23/22 ?0622 02/23/22 ?1627 02/24/22 ?0505 02/25/22 ?0107 02/26/22 ?7371 02/27/22 ?0626  ?NA 137  --  131* 135 132* 135  ?K 4.5  --  5.8* 4.5 4.9 3.3*  ?CL 102  --  98 101 99 100  ?CO2  --   --  21* '27 23 27  '$ ?GLUCOSE 58*  --  260* 170* 154* 120*  ?BUN 29*  --  47* 20 45* 14  ?CREATININE 5.90* 5.90* 6.62* 3.45* 4.99* 2.49*  ?CALCIUM  --   --  7.8* 7.5* 7.6* 7.0*  ?PHOS  --   --   --  3.4 3.9  --   ? ?GFR: ?Estimated Creatinine Clearance: 17.9 mL/min (A) (by C-G formula based on SCr of 2.49 mg/dL (H)). ?Liver Function Tests: ?Recent Labs  ?Lab 02/25/22 ?0107 02/26/22 ?0431  ?ALBUMIN 2.5* 2.5*  ? ?No results for input(s): LIPASE, AMYLASE in the last 168 hours. ?No results for input(s): AMMONIA in the last 168 hours. ?Coagulation Profile: ?Recent Labs  ?Lab 02/22/22 ?1338  ?INR 1.2  ? ?Cardiac Enzymes: ?No results for input(s): CKTOTAL, CKMB, CKMBINDEX, TROPONINI in the last 168 hours. ?BNP (last 3 results) ?No results for input(s): PROBNP in the last 8760 hours. ?HbA1C: ?No results for input(s): HGBA1C in the last 72 hours. ? ?CBG: ?Recent Labs  ?Lab 02/27/22 ?1153 02/27/22 ?1645 02/27/22 ?2019 02/27/22 ?2144 02/28/22 ?9485  ?GLUCAP 92 164* 235* 138* 83  ? ?Lipid Profile: ?No results for input(s): CHOL, HDL, LDLCALC, TRIG, CHOLHDL, LDLDIRECT in the last 72 hours. ? ?Thyroid Function Tests: ?No results for input(s): TSH, T4TOTAL, FREET4, T3FREE, THYROIDAB in the last 72 hours. ?Anemia  Panel: ?No results for input(s): VITAMINB12, FOLATE, FERRITIN, TIBC, IRON, RETICCTPCT in the last 72 hours. ?Sepsis Labs: ?No results for input(s): PROCALCITON, LATICACIDVEN in the last 168 hours. ? ?Recent Results (from the past 240 hour(s))  ?Surgical PCR Screen     Status: Abnormal  ? Collection Time: 02/22/22  1:18 PM  ? Specimen: Nasal Mucosa; Nasal Swab  ?Result Value Ref Range Status  ? MRSA, PCR NEGATIVE NEGATIVE Final  ? Staphylococcus aureus POSITIVE (A) NEGATIVE Final  ?  Comment: (NOTE) ?The Xpert SA Assay (FDA approved for NASAL specimens in patients 83 ?years of age and older), is one component of a comprehensive ?surveillance program. It is not intended to diagnose infection nor to ?guide or monitor treatment. ?Performed at [facility], [place] [address], [place], [place] ?[address] ?  ?  ? ? ? ? ? ?Radiology Studies: ?DG CHEST PORT 1 VIEW ? ?Result Date: 02/26/2022 ?CLINICAL DATA:  Shortness of breath EXAM: PORTABLE CHEST 1  VIEW COMPARISON:  11/26/2021 FINDINGS: Transverse diameter of heart is increased. Central pulmonary vessels are prominent. There are no signs of alveolar pulmonary edema or focal pulmonary consolidation. There is no significant pleural effusion or pneumothorax. There is a vascular coil overlying the liver. Surgical clips are seen in mediastinum. There are metallic sutures in the sternum. IMPRESSION: Cardiomegaly. Central pulmonary vessels are prominent without signs of alveolar pulmonary edema. No new focal infiltrates are seen. Electronically Signed   By: Elmer Picker M.D.   On: 02/26/2022 14:46   ? ? ? ? ? ?Scheduled Meds: ? (feeding supplement) PROSource Plus  30 mL Oral BID BM  ? sodium chloride   Intravenous Once  ? amLODipine  5 mg Oral Daily  ? aspirin EC  81 mg Oral Daily  ? azithromycin  250 mg Oral Q M,W,F  ? calcium acetate  667 mg Oral BID WC  ? carvedilol  25 mg Oral 2 times per day on Sun Mon Wed Fri  ? And  ? carvedilol  25 mg Oral Once per day on Tue Thu  Sat  ? Chlorhexidine Gluconate Cloth  6 each Topical Q0600  ? clopidogrel  75 mg Oral Daily  ? docusate sodium  100 mg Oral Daily  ? doxercalciferol  3 mcg Intravenous Q T,Th,Sa-HD  ? famotidine  20 mg O

## 2022-03-01 DIAGNOSIS — M869 Osteomyelitis, unspecified: Secondary | ICD-10-CM | POA: Diagnosis not present

## 2022-03-01 DIAGNOSIS — I739 Peripheral vascular disease, unspecified: Secondary | ICD-10-CM | POA: Diagnosis not present

## 2022-03-01 DIAGNOSIS — Z992 Dependence on renal dialysis: Secondary | ICD-10-CM | POA: Diagnosis not present

## 2022-03-01 DIAGNOSIS — N186 End stage renal disease: Secondary | ICD-10-CM | POA: Diagnosis not present

## 2022-03-01 LAB — GLUCOSE, CAPILLARY
Glucose-Capillary: 100 mg/dL — ABNORMAL HIGH (ref 70–99)
Glucose-Capillary: 146 mg/dL — ABNORMAL HIGH (ref 70–99)

## 2022-03-01 LAB — TACROLIMUS LEVEL: Tacrolimus (FK506) - LabCorp: 3 ng/mL (ref 2.0–20.0)

## 2022-03-01 LAB — PREPARE RBC (CROSSMATCH)

## 2022-03-01 NOTE — Progress Notes (Signed)
?PROGRESS NOTE ? ? ? ?JERMALL ISAACSON  KWI:[id number] DOB: 1941-07-03 DOA: 02/23/2022 ?PCP: Mikey College, MD  ? ?Brief Narrative:  ?81 year old with history of ESRD on hemodialysis, lung transplant in 2004 on immunosuppressants, HTN, bronchiectasis, DM2 presents to the hospital with complaints of diabetic foot ulcer of the right toe.  He underwent revascularization and amputation with wound VAC placement on 02/23/2022 by vascular and orthopedic.  Medical team consulted to help assist with management.  Hospitalization was complicated by 1 episode of hypotension/rigors during hemodialysis which resolved thereafter.  He also had transient altered mental status/acute metabolic encephalopathy which resolved after hemodialysis. ? ? ?Assessment & Plan: ? Active Problems: ?  PVD (peripheral vascular disease) (HCC) s/p femoral-popliteal bypass surgery ?  BRONCHIECTASIS ?  ESRD on hemodialysis ([place]) ?  S/P femoral-popliteal bypass surgery ?  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  Rigors ?  Type 2 diabetes mellitus with peripheral neuropathy (HCC) ?  History of lung transplant ([place]) secondary to bronchiectasis ?  Essential hypertension ?  Anemia ?  ? ? ?Assessment and Plan: ?PVD (peripheral vascular disease) ([place]) s/p femoral-popliteal bypass surgery ?Status post bypass and endarterectomy by vascular 3/22 ? ?Rigors ?Resolved. Hypotension during HD on 3/25.  Isolated event.  Thereafter using been hemodynamically stable.  Troponins remain flat.  Chest x-ray and ABG are unremarkable.  Resolved no obvious evidence of infection.  EKG shows lateral T wave depression but without any chest pain.  Continue to follow-up outpatient. ? 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 ?S/P femoral-popliteal bypass surgery ?Performed by vascular on 3/22 ? ?ESRD on  hemodialysis ([place]) ?Hemodialysis TTS, nephrology team following ?HD per nephrology ? ?BRONCHIECTASIS ?Bronchodilators, I-S/flutter valve.  He is status post bilateral lung transplant on chronic immunosuppressant follows at [facility]. ?Tacrolimus levels - WNL ? ?Anemia ?No sign of blood loss. Hb 7.7.  Transfuse prn 1U PRBC with HD ? ?Essential hypertension ?Continue current blood pressure medication regimen. ? ? ? ?History of lung transplant ([place]) secondary to bronchiectasis ?Status post bilateral lung transplant at [facility] in 2004. ?Home medications- Prograf, prednisone, Bactrim ? ? ?Type 2 diabetes mellitus with peripheral neuropathy (HCC) ?Sliding scale and Accu-Chek ?Due to some hypoglycemia will discontinue Premeal insulin ?A1c 02/23/2022-5.8 ? ? ? ? ? ?DVT prophylaxis: Subcu heparin ?Code Status: Full code ?Family Communication:   ? ?Patient is medically doing well and cleared for discharge from my standpoint with close outpatient follow-up with PCP in 1 week.  CBC in next 5 to 7 days upon discharge. ? ? ?Subjective: ?Seen and examined before his hemodialysis, no complaints.  Awaiting his hemodialysis.  Thereafter he is eager to be discharged. ?Examination: ?Constitutional: Not in acute distress ?Respiratory: Clear to auscultation bilaterally ?Cardiovascular: Normal sinus rhythm, no rubs ?Abdomen: Nontender nondistended good bowel sounds ?Musculoskeletal: Amputation site noted with wound VAC in place. ?Skin: No rashes seen ?Neurologic: CN 2-12 grossly intact.  And nonfocal ?Psychiatric: Normal judgment and insight. Alert and oriented x 3. Normal mood.  ? ? ? ?Objective: ?Vitals:  ? 02/28/22 1944 02/28/22 2307 03/01/22 0500 03/01/22 0803  ?BP: (!) 132/59 137/61 (!) 141/58 (!) 153/80  ?Pulse: 73 77  77  ?Resp: '20 17 18 20  '$ ?Temp: 98.1 ?F (36.7 ?C) 98.4 ?F (36.9 ?C) 98.1 ?F (36.7 ?C) 98.3 ?F (36.8 ?C)  ?TempSrc:  Oral Oral Oral Oral  ?SpO2: 100% 100% 100% 96%  ?Weight:   54.1 kg   ?Height:      ? ?No intake or output data  in the 24 hours ending 03/01/22 0828 ? ?Filed Weights  ? 02/26/22 2339 02/27/22 0339 03/01/22 0500  ?Weight: 53.9 kg 51.9 kg 54.1 kg  ? ? ? ?Data Reviewed:  ? ?CBC: ?Recent Labs  ?Lab 02/23/22 ?0622 02/23/22 ?1627 02/24/22 ?0505 02/26/22 ?8115 02/27/22 ?7262  ?WBC  --  9.9 10.2 11.0* 11.0*  ?HGB 11.2* 9.3* 8.0* 7.6* 7.7*  ?HCT 33.0* 29.7* 26.6* 24.7* 24.9*  ?MCV  --  96.7 97.4 99.2 96.9  ?PLT  --  214 198 164 162  ? ?Basic Metabolic Panel: ?Recent Labs  ?Lab 02/23/22 ?0622 02/23/22 ?1627 02/24/22 ?0505 02/25/22 ?0107 02/26/22 ?0355 02/27/22 ?9741  ?NA 137  --  131* 135 132* 135  ?K 4.5  --  5.8* 4.5 4.9 3.3*  ?CL 102  --  98 101 99 100  ?CO2  --   --  21* '27 23 27  '$ ?GLUCOSE 58*  --  260* 170* 154* 120*  ?BUN 29*  --  47* 20 45* 14  ?CREATININE 5.90* 5.90* 6.62* 3.45* 4.99* 2.49*  ?CALCIUM  --   --  7.8* 7.5* 7.6* 7.0*  ?PHOS  --   --   --  3.4 3.9  --   ? ?GFR: ?Estimated Creatinine Clearance: 18.7 mL/min (A) (by C-G formula based on SCr of 2.49 mg/dL (H)). ?Liver Function Tests: ?Recent Labs  ?Lab 02/25/22 ?0107 02/26/22 ?0431  ?ALBUMIN 2.5* 2.5*  ? ?No results for input(s): LIPASE, AMYLASE in the last 168 hours. ?No results for input(s): AMMONIA in the last 168 hours. ?Coagulation Profile: ?Recent Labs  ?Lab 02/22/22 ?1338  ?INR 1.2  ? ?Cardiac Enzymes: ?No results for input(s): CKTOTAL, CKMB, CKMBINDEX, TROPONINI in the last 168 hours. ?BNP (last 3 results) ?No results for input(s): PROBNP in the last 8760 hours. ?HbA1C: ?No results for input(s): HGBA1C in the last 72 hours. ? ?CBG: ?Recent Labs  ?Lab 02/28/22 ?6384 02/28/22 ?1236 02/28/22 ?1640 02/28/22 ?2042 03/01/22 ?0532  ?GLUCAP 83 151* 202* 260* 100*  ? ?Lipid Profile: ?No results for input(s): CHOL, HDL, LDLCALC, TRIG, CHOLHDL, LDLDIRECT in the last 72 hours. ? ?Thyroid Function Tests: ?No results for input(s): TSH, T4TOTAL, FREET4, T3FREE, THYROIDAB in the last 72 hours. ?Anemia Panel: ?No results for input(s): VITAMINB12, FOLATE, FERRITIN, TIBC, IRON,  RETICCTPCT in the last 72 hours. ?Sepsis Labs: ?No results for input(s): PROCALCITON, LATICACIDVEN in the last 168 hours. ? ?Recent Results (from the past 240 hour(s))  ?Surgical PCR Screen     Status: Abnormal  ? Collection Time: 02/22/22  1:18 PM  ? Specimen: Nasal Mucosa; Nasal Swab  ?Result Value Ref Range Status  ? MRSA, PCR NEGATIVE NEGATIVE Final  ? Staphylococcus aureus POSITIVE (A) NEGATIVE Final  ?  Comment: (NOTE) ?The Xpert SA Assay (FDA approved for NASAL specimens in patients 49 ?years of age and older), is one component of a comprehensive ?surveillance program. It is not intended to diagnose infection nor to ?guide or monitor treatment. ?Performed at [facility], [place] [address] [place], [place] ?[address] ?  ?  ? ? ? ? ? ?Radiology Studies: ?No results found. ? ? ? ? ? ?Scheduled Meds: ? (feeding supplement) PROSource Plus  30 mL Oral BID BM  ? sodium chloride   Intravenous Once  ? amLODipine  5 mg Oral Daily  ?  aspirin EC  81 mg Oral Daily  ? azithromycin  250 mg Oral Q M,W,F  ? calcium acetate  667 mg Oral BID WC  ? carvedilol  25 mg Oral 2 times per day on Sun Mon Wed Fri  ? And  ? carvedilol  25 mg Oral Once per day on Tue Thu Sat  ? Chlorhexidine Gluconate Cloth  6 each Topical Q0600  ? clopidogrel  75 mg Oral Daily  ? docusate sodium  100 mg Oral Daily  ? doxercalciferol  3 mcg Intravenous Q T,Th,Sa-HD  ? famotidine  20 mg Oral Daily  ? heparin  5,000 Units Subcutaneous Q8H  ? insulin aspart  0-15 Units Subcutaneous TID WC  ? insulin aspart  0-5 Units Subcutaneous QHS  ? lanthanum  1,500 mg Oral BID WC  ? midodrine  5 mg Oral Q T,Th,Sa-HD  ? multivitamin with minerals  1 tablet Oral Daily  ? mupirocin ointment   Nasal BID  ? pantoprazole  40 mg Oral Daily  ? pravastatin  20 mg Oral QPM  ? predniSONE  5 mg Oral Daily  ? sulfamethoxazole-trimethoprim  1 tablet Oral QPM  ? tacrolimus  0.5 mg Oral BID  ? ?Continuous Infusions: ? sodium chloride    ? magnesium sulfate bolus IVPB    ? ? ? LOS:  6 days  ? ?Time spent= 35 mins ? ? ? ?Orlandus Borowski Arsenio Loader, MD ?[facility] ? ?If 7PM-7AM, please contact night-coverage ? ?03/01/2022, 8:28 AM  ? ?

## 2022-03-01 NOTE — Progress Notes (Signed)
Physical Therapy Treatment ?Patient Details ?Name: Tyler Mueller ?MRN: [id number] ?DOB: [birth year] ?Today's Date: 03/01/2022 ? ? ?History of Present Illness 81 y/o male who was admitted 02/23/22 with gangrene on R 3rd toe. S/p abdominal aortogram w/ RLE and peripheral vascular balloon angioplasty (right) on 02/22/22. S/p Right femoral to below-knee popliteal artery bypass graft and Right common femoral and profundofemoral endarterectomy as well as amputation of the third ray with wound vac on 02/23/22. PMH includes Diabetes mellitus without complication, Diverticulitis, ESRD on hemodialysis (T, Th, Sat), History of lung transplant (04/21/2014; 12/05/2002), HTN, and Skin cancer of face, Cataract extraction; Appendectomy. Recently admitted in December 2022 due to flu, frequent falls. ? ?  ?PT Comments  ? ? Patient progressing well towards PT goals. Tolerated bed mobility, transfers and gait training with supervision for safety and use of RW for support. Improved ambulation distance today and reports less soreness. Eager to get to HD and go home. Discussed appropriate technique for stair training to enter home. Will continue to follow to improve overall strength, mobility and safety. ?   ?Recommendations for follow up therapy are one component of a multi-disciplinary discharge planning process, led by the attending physician.  Recommendations may be updated based on patient status, additional functional criteria and insurance authorization. ? ?Follow Up Recommendations ? No PT follow up ?  ?  ?Assistance Recommended at Discharge Intermittent Supervision/Assistance  ?Patient can return home with the following A little help with bathing/dressing/bathroom;Assistance with cooking/housework;Assist for transportation;Help with stairs or ramp for entrance;A little help with walking and/or transfers ?  ?Equipment Recommendations ? None recommended by PT  ?  ?Recommendations for Other Services   ? ? ?  ?Precautions / Restrictions  Precautions ?Precautions: Fall ?Precaution Comments: wound vac R foot ?Required Braces or Orthoses: Other Brace ?Other Brace: Darco shoe in room ?Restrictions ?Weight Bearing Restrictions: Yes ?RLE Weight Bearing: Partial weight bearing ?RLE Partial Weight Bearing Percentage or Pounds:  (Heel only) ?Other Position/Activity Restrictions: wound vac RLE  ?  ? ?Mobility ? Bed Mobility ?Overal bed mobility: Needs Assistance ?Bed Mobility: Supine to Sit, Sit to Supine ?  ?  ?Supine to sit: Supervision, HOB elevated ?Sit to supine: Supervision, HOB elevated ?  ?General bed mobility comments: Able to get to EOB without assist with increased time. ?  ? ?Transfers ?Overall transfer level: Needs assistance ?Equipment used: Rolling walker (2 wheels) ?Transfers: Sit to/from Stand ?Sit to Stand: Supervision ?  ?  ?  ?  ?  ?General transfer comment: Supervision for safety, stood from EOB x1. ?  ? ?Ambulation/Gait ?Ambulation/Gait assistance: Supervision ?Gait Distance (Feet): 200 Feet ?Assistive device: Rolling walker (2 wheels) ?Gait Pattern/deviations: Antalgic, Step-through pattern, Decreased stance time - right, Decreased step length - left, Trunk flexed ?Gait velocity: reduced ?Gait velocity interpretation: <1.31 ft/sec, indicative of household ambulator ?  ?General Gait Details: Slow, mostly steady gait with Darco shoe and left shoe donned. No evidence of imbalance. ? ? ?Stairs ?  ?  ?  ?  ?  ? ? ?Wheelchair Mobility ?  ? ?Modified Rankin (Stroke Patients Only) ?  ? ? ?  ?Balance Overall balance assessment: Needs assistance ?Sitting-balance support: No upper extremity supported, Feet supported ?Sitting balance-Leahy Scale: Good ?Sitting balance - Comments: Able to reach outside boS and doff darco shoe/regular shoe without LOB. ?  ?Standing balance support: During functional activity ?Standing balance-Leahy Scale: Poor ?Standing balance comment: Reliant on BUE support ?  ?  ?  ?  ?  ?  ?  ?  ?  ?  ?  ?  ? ?  ?  Cognition  Arousal/Alertness: Awake/alert ?Behavior During Therapy: [facility] for tasks assessed/performed ?Overall Cognitive Status: Within Functional Limits for tasks assessed ?  ?  ?  ?  ?  ?  ?  ?  ?  ?  ?  ?  ?  ?  ?  ?  ?General Comments: Feels close to baseline ?  ?  ? ?  ?Exercises   ? ?  ?General Comments General comments (skin integrity, edema, etc.): VSS on RA. ?  ?  ? ?Pertinent Vitals/Pain Pain Assessment ?Pain Assessment: Faces ?Faces Pain Scale: Hurts a little bit ?Pain Location: R foot and leg ?Pain Descriptors / Indicators: Operative site guarding, Sore ?Pain Intervention(s): Monitored during session, Repositioned  ? ? ?Home Living   ?  ?  ?  ?  ?  ?  ?  ?  ?  ?   ?  ?Prior Function    ?  ?  ?   ? ?PT Goals (current goals can now be found in the care plan section) Progress towards PT goals: Progressing toward goals ? ?  ?Frequency ? ? ? Min 3X/week ? ? ? ?  ?PT Plan Discharge plan needs to be updated  ? ? ?Co-evaluation   ?  ?  ?  ?  ? ?  ?AM-PAC PT "6 Clicks" Mobility   ?Outcome Measure ? Help needed turning from your back to your side while in a flat bed without using bedrails?: None ?Help needed moving from lying on your back to sitting on the side of a flat bed without using bedrails?: None ?Help needed moving to and from a bed to a chair (including a wheelchair)?: A Little ?Help needed standing up from a chair using your arms (e.g., wheelchair or bedside chair)?: A Little ?Help needed to walk in hospital room?: A Little ?Help needed climbing 3-5 steps with a railing? : A Little ?6 Click Score: 20 ? ?  ?End of Session Equipment Utilized During Treatment: Gait belt ?Activity Tolerance: Patient tolerated treatment well ?Patient left: in bed;with call bell/phone within reach;with bed alarm set ?Nurse Communication: Mobility status ?PT Visit Diagnosis: Unsteadiness on feet (R26.81);Other abnormalities of gait and mobility (R26.89);Muscle weakness (generalized) (M62.81);History of falling (Z91.81);Difficulty in  walking, not elsewhere classified (R26.2);Pain ?Pain - Right/Left: Right ?Pain - part of body: Ankle and joints of foot ?  ? ? ?Time: 6962-9528 ?PT Time Calculation (min) (ACUTE ONLY): 18 min ? ?Charges:  $Gait Training: 8-22 mins          ?          ? ?Marisa Severin, PT, DPT ?Acute Rehabilitation Services ?Pager [phone] ?Office [phone] ? ? ? ? ? ?[place] ?03/01/2022, 10:04 AM ? ?

## 2022-03-01 NOTE — Progress Notes (Signed)
Mobility Specialist Progress Note ? ? 03/01/22 1105  ?Mobility  ?Activity Ambulated with assistance in hallway  ?Level of Assistance Standby assist, set-up cues, supervision of patient - no hands on  ?Assistive Device Front wheel walker  ?RLE Weight Bearing PWB  ?RLE Partial Weight Bearing Percentage or Pounds  ?(Heel only)  ?Distance Ambulated (ft) 230 ft  ?Activity Response Tolerated well  ?$Mobility charge 1 Mobility  ? ?Pre Mobility: 75 HR, 149/87 BP ?During Mobility: 84 HR ?Post Mobility: 76 HR, 149/60 BP ? ?Received pt in bed having no complaints and agreeable to ambulate. Pt able to don darco and normal shoe w/o assistance. C/o discomfort in L knee when ambulating but irritation subsided towards in of session. Pt acknowledged and demonstrated WB precautions well, returned back to bed w/ call bell in reach and bed alarm on.  ? ? ?Holland Falling ?Mobility Specialist ?Phone Number [phone] ? ?

## 2022-03-01 NOTE — Progress Notes (Signed)
?[facility] ?Progress Note  ? ?Subjective:   ?No c/o this AM ?For HD and DC today ? ?Objective ?Vitals:  ? 02/28/22 1944 02/28/22 2307 03/01/22 0500 03/01/22 0803  ?BP: (!) 132/59 137/61 (!) 141/58 (!) 153/80  ?Pulse: 73 77  77  ?Resp: '20 17 18 20  '$ ?Temp: 98.1 ?F (36.7 ?C) 98.4 ?F (36.9 ?C) 98.1 ?F (36.7 ?C) 98.3 ?F (36.8 ?C)  ?TempSrc: Oral Oral Oral Oral  ?SpO2: 100% 100% 100% 96%  ?Weight:   54.1 kg   ?Height:      ? ?Physical Exam ?General: Elderly, chronically ill appearing man, NAD - room air. ?Heart: RRR; 2/6 murmur ?Lungs: CTA anteriorly ?Abdomen: soft ?Extremities: No LE edema; R foot with wound vac,  ?Dialysis Access: L forearm AVF + thrill ? ?Additional Objective ?Labs: ?Basic Metabolic Panel: ?Recent Labs  ?Lab 02/25/22 ?0107 02/26/22 ?0431 02/27/22 ?3532  ?NA 135 132* 135  ?K 4.5 4.9 3.3*  ?CL 101 99 100  ?CO2 '27 23 27  '$ ?GLUCOSE 170* 154* 120*  ?BUN 20 45* 14  ?CREATININE 3.45* 4.99* 2.49*  ?CALCIUM 7.5* 7.6* 7.0*  ?PHOS 3.4 3.9  --   ? ? ?Liver Function Tests: ?Recent Labs  ?Lab 02/25/22 ?0107 02/26/22 ?0431  ?ALBUMIN 2.5* 2.5*  ? ? ?CBC: ?Recent Labs  ?Lab 02/23/22 ?1627 02/24/22 ?0505 02/26/22 ?9924 02/27/22 ?2683  ?WBC 9.9 10.2 11.0* 11.0*  ?HGB 9.3* 8.0* 7.6* 7.7*  ?HCT 29.7* 26.6* 24.7* 24.9*  ?MCV 96.7 97.4 99.2 96.9  ?PLT 214 198 164 162  ? ? ?CBG: ?Recent Labs  ?Lab 02/28/22 ?4196 02/28/22 ?1236 02/28/22 ?1640 02/28/22 ?2042 03/01/22 ?0532  ?GLUCAP 83 151* 202* 260* 100*  ? ? ?Studies/Results: ?No results found. ? ?Medications: ? sodium chloride    ? magnesium sulfate bolus IVPB    ? ? (feeding supplement) PROSource Plus  30 mL Oral BID BM  ? sodium chloride   Intravenous Once  ? amLODipine  5 mg Oral Daily  ? aspirin EC  81 mg Oral Daily  ? azithromycin  250 mg Oral Q M,W,F  ? calcium acetate  667 mg Oral BID WC  ? carvedilol  25 mg Oral 2 times per day on Sun Mon Wed Fri  ? And  ? carvedilol  25 mg Oral Once per day on Tue Thu Sat  ? Chlorhexidine Gluconate Cloth  6 each  Topical Q0600  ? clopidogrel  75 mg Oral Daily  ? docusate sodium  100 mg Oral Daily  ? doxercalciferol  3 mcg Intravenous Q T,Th,Sa-HD  ? famotidine  20 mg Oral Daily  ? heparin  5,000 Units Subcutaneous Q8H  ? insulin aspart  0-15 Units Subcutaneous TID WC  ? insulin aspart  0-5 Units Subcutaneous QHS  ? lanthanum  1,500 mg Oral BID WC  ? midodrine  5 mg Oral Q T,Th,Sa-HD  ? multivitamin with minerals  1 tablet Oral Daily  ? mupirocin ointment   Nasal BID  ? pantoprazole  40 mg Oral Daily  ? pravastatin  20 mg Oral QPM  ? predniSONE  5 mg Oral Daily  ? sulfamethoxazole-trimethoprim  1 tablet Oral QPM  ? tacrolimus  0.5 mg Oral BID  ? ? ?Dialysis Orders: ?[facility] ?3.5h  350/600 52 kg   2/2 bath  LFA AVF No Heparin ?- Venofer 100 mg IV  X 5 doses (3/5 doses given)  ?- Hectorol 3 mcg IV TIW ?- Mircera 100 mcg IV q 2 weeks (Last dose 02/17/2022) ?  ?  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 Sharol Given: Per primary/Ortho.  ?PAD s/p Right femoral to below knee popliteal bypass grafting with PTFE per Dr. Trula Slade 02/23/2022. Per primary/VVS. ?ESRD: Continue HD on TTS schedule, no heparin. Next HD 3/28. 2K AVF no heparin. 350/600.  1-2L UF  ?BP/ volume: BP stable today, no edema.  ?Anemia of ESRD: Hgb stable 7.7 on 02/27/22. Due for ESA 3/30; transfuse as needed ?Secondary hyperparathyroidism: CorrCa/Phos ok. Continue VDRA, calcium based binders.  ?Hx lung transplant: On prograf and prednisone. Follows with Dr. Doy Mince, [facility] transplant.  ?DMT2 ? ?Rexene Agent, MD  ?03/01/2022, 10:43 AM  ?[facility] ? ? ? ?

## 2022-03-01 NOTE — Progress Notes (Signed)
?  Progress Note ? ? ? ?03/01/2022 ?9:10 AM ?6 Days Post-Op ? ?Subjective:  no complaints.  Wants to go home ? ? ?Vitals:  ? 03/01/22 0500 03/01/22 0803  ?BP: (!) 141/58 (!) 153/80  ?Pulse:  77  ?Resp: 18 20  ?Temp: 98.1 ?F (36.7 ?C) 98.3 ?F (36.8 ?C)  ?SpO2: 100% 96%  ? ?Physical Exam: ?Lungs:  non labored ?Incisions:  groin and pop incision c/d/i ?Extremities:  brisk ATA signal by doppler ?Neurologic: A&O ? ?CBC ?   ?Component Value Date/Time  ? WBC 11.0 (H) 02/27/2022 0752  ? RBC 2.57 (L) 02/27/2022 0752  ? HGB 7.7 (L) 02/27/2022 0752  ? HCT 24.9 (L) 02/27/2022 0752  ? PLT 162 02/27/2022 0752  ? MCV 96.9 02/27/2022 0752  ? MCH 30.0 02/27/2022 0752  ? MCHC 30.9 02/27/2022 0752  ? RDW 19.9 (H) 02/27/2022 0752  ? LYMPHSABS 1.1 02/04/2022 1442  ? MONOABS 0.6 02/04/2022 1442  ? EOSABS 0.1 02/04/2022 1442  ? BASOSABS 0.1 02/04/2022 1442  ? ? ?BMET ?   ?Component Value Date/Time  ? NA 135 02/27/2022 0752  ? K 3.3 (L) 02/27/2022 0752  ? CL 100 02/27/2022 0752  ? CO2 27 02/27/2022 0752  ? GLUCOSE 120 (H) 02/27/2022 0752  ? GLUCOSE 104 (H) 10/31/2006 0755  ? BUN 14 02/27/2022 0752  ? CREATININE 2.49 (H) 02/27/2022 0752  ? CALCIUM 7.0 (L) 02/27/2022 0752  ? CALCIUM 8.5 05/09/2011 1339  ? GFRNONAA 26 (L) 02/27/2022 0752  ? GFRAA 6 (L) 07/16/2015 2330  ? ? ?INR ?   ?Component Value Date/Time  ? INR 1.2 02/22/2022 1338  ? ? ?No intake or output data in the 24 hours ending 03/01/22 0910 ? ? ?Assessment/Plan:  81 y.o. male is s/p R CFA endarterectomy and femoral to popliteal bypass with PTFE 6 Days Post-Op  ? ?R foot well perfused with brisk ATA doppler signal ?Plan is for discharge home with [facility] PT after HD treatment today.  He will receive 1 unit pRBC with HD today ?Patient will follow up with Dr. Sharol Given for management of toe amputation ? ? ? ?Dagoberto Ligas, PA-C ?[facility] ?[phone] ?03/01/2022 ?9:10 AM ? ? ? ?

## 2022-03-01 NOTE — Progress Notes (Signed)
D/C order noted. Per MD note and orders, pt to receive blood with HD treatment today. Contacted [facility] to make clinic aware of pt's planned d/c for today and that pt will resume care on Thursday. ? ?Melven Sartorius ?Renal Navigator ?[phone] ?

## 2022-03-02 ENCOUNTER — Telehealth: Payer: Self-pay | Admitting: Nephrology

## 2022-03-02 DIAGNOSIS — T82392A Other mechanical complication of femoral arterial graft (bypass), initial encounter: Secondary | ICD-10-CM | POA: Insufficient documentation

## 2022-03-02 LAB — TYPE AND SCREEN
ABO/RH(D): B POS
Antibody Screen: NEGATIVE
Unit division: 0

## 2022-03-02 LAB — BPAM RBC
Blood Product Expiration Date: 202304202359
ISSUE DATE / TIME: 202303281547
Unit Type and Rh: 7300

## 2022-03-02 NOTE — Telephone Encounter (Signed)
Transition of care contact from inpatient facility ? ?Date of discharge: 03/01/22 ?Date of contact: 03/02/22 ?Method: Phone ?Spoke to: Patient ? ?Patient contacted to discuss transition of care from recent inpatient hospitalization. Patient was admitted to [facility] from .3/22-3/28/23.Marland Kitchen with discharge diagnosis of .Marland KitchenPAD sp R 3rd toe amp,R fem to below Knee popliteal bypass grafting  with PTFE . ? ?Medication changes were reviewed. ? ?Patient will follow up with his/her outpatient HD unit on:  ? ? ?

## 2022-03-02 NOTE — Discharge Summary (Signed)
?Bypass Discharge Summary ?Patient ID: ?Tyler Mueller ?[id number] ?81 y.o. ?Jan 18, 1941 ? ?Admit date: 02/23/2022 ? ?Discharge date and time: 03/01/2022  7:54 PM  ? ?Admitting Physician: Serafina Mitchell, MD  ? ?Discharge Physician: same ? ?Admission Diagnoses: PAD (peripheral artery disease) ([place]) [I73.9] ? ?Discharge Diagnoses: same ? ?Admission Condition: fair ? ?Discharged Condition: fair ? ?Indication for Admission: bypass surgery ? ?Hospital Course: [name] is a 81 year old male who was brought in as an outpatient and underwent right common femoral artery endarterectomy and femoral to below the knee popliteal bypass with PTFE by Dr. Trula Slade on 02/23/2022.  After our portion of the surgery was complete, Dr. Sharol Given performed a right third toe ray amputation with placement of skin substitute and wound VAC.  The patient tolerated the procedure and was admitted to the hospital postoperatively.  Nephrology was consulted for management of ESRD on HD during inpatient stay.  Hospitalist medicine was also consulted for medical management of hypertension, hyperlipidemia, and diabetes mellitus during inpatient stay.  POD #2 patient had an episode of rigors and hypotension however further work-up was negative.  The rest of his postoperative stay was unremarkable.  Patient did however require transfusion of 1 unit of packed red blood cells during HD on 02/28/2022 prior to discharge home.  Patient will follow-up with Dr. Sharol Given for management of wound VAC.  He will follow-up with [facility] in about 2 to 3 weeks.  He was discharged on narcotic pain medication for continued postoperative pain control.  TOC was also consulted to arrange home health PT.  He was discharged home in stable condition. ? ?Consults: nephrology and Hospitalist and orthopedics ? ?Treatments: surgery: Right femoral to below the knee popliteal bypass with PTFE and common femoral artery endarterectomy by Dr. Trula Slade on 02/23/2022. ? ?Third toe ray amputation  with application of wound VAC and skin substitute by Dr. Sharol Given 02/23/22 ? ? ? ?Disposition: Discharge disposition: 01-Home or Self Care ? ? ? ? ? ? ?- For [facility] Registry use --- ? ?Post-op:  ?Wound infection: No  ?Graft infection: No  ?Transfusion: Yes  ?New Arrhythmia: No ?Patency judged by: [ x] Dopper only, '[ ]'$  Palpable graft pulse, '[ ]'$  Palpable distal pulse, '[ ]'$  ABI inc. > 0.15, '[ ]'$  Duplex ?D/C Ambulatory Status: Ambulatory ? ?Complications: ?MI: [ x] No, '[ ]'$  Troponin only, '[ ]'$  EKG or Clinical ?CHF: No ?Resp failure: [ x] none, '[ ]'$  Pneumonia, '[ ]'$  Ventilator ?Chg in renal function: [ x] none, '[ ]'$  Inc. Cr > 0.5, '[ ]'$  Temp. Dialysis, '[ ]'$  Permanent dialysis ?Stroke: [x ] None, '[ ]'$  Minor, '[ ]'$  Major ?Return to OR: No  ?Reason for return to OR: '[ ]'$  Bleeding, '[ ]'$  Infection, '[ ]'$  Thrombosis, '[ ]'$  Revision ? ?Discharge medications: ?Statin use:  Yes ?ASA use:  Yes ?Plavix use:  No  for medical reason not indicated ?Beta blocker use: No  for medical reason not indicated ?Coumadin use: No  for medical reason not indicated ? ? ? ?Patient Instructions:  ?Allergies as of 03/01/2022   ? ?   Reactions  ? Lorazepam Other (See Comments)  ? delirium  ? Morphine And Related Nausea And Vomiting, Other (See Comments)  ? Hallucinations   ? Nsaids Other (See Comments)  ?  Kidney disorder  ? Penicillins Nausea And Vomiting, Other (See Comments)  ? Hallucinations   ? Amphetamine-dextroamphetamine Anxiety, Other (See Comments)  ? confusion  ? ?  ? ?  ?Medication List  ?  ? ?  TAKE these medications   ? ?acetaminophen 500 MG tablet ?Commonly known as: TYLENOL ?Take 500 mg by mouth every 6 (six) hours as needed for moderate pain. ?  ?acyclovir 200 MG capsule ?Commonly known as: ZOVIRAX ?Take 4 capsules (800 mg total) by mouth daily. ?  ?amLODipine 5 MG tablet ?Commonly known as: NORVASC ?Take 5 mg by mouth daily. Hold for systolic bp less than 035. ?  ?aspirin EC 81 MG tablet ?Take 81 mg by mouth daily. ?  ?azithromycin 250 MG tablet ?Commonly known  as: ZITHROMAX ?MAY ADMINISTER WITHOUT REGARD TO MEALS ?What changed:  ?how much to take ?how to take this ?when to take this ?additional instructions ?  ?BEANO PO ?Take 1 tablet by mouth daily as needed (gas/bloating). ?  ?beta carotene w/minerals tablet ?Take 1 tablet by mouth daily. ?  ?CAL-MAG-ZINC PO ?Take 1 tablet by mouth in the morning. ?  ?calcium acetate 667 MG capsule ?Commonly known as: PHOSLO ?Take 2,001 mg by mouth 2 (two) times daily with a meal. ?  ?carvedilol 25 MG tablet ?Commonly known as: COREG ?Take 25 mg by mouth daily. Take 1 tablet (25 mg) by mouth in the evening on Tuesdays, Thursdays & Saturdays ( dialysis days) ?  ?famotidine 20 MG tablet ?Commonly known as: PEPCID ?Take 20 mg by mouth in the morning. ?  ?ferrous gluconate 225 (27 Fe) MG tablet ?Commonly known as: FERGON ?Take 240 mg by mouth every evening. ?  ?fluticasone-salmeterol 250-50 MCG/ACT Aepb ?Commonly known as: ADVAIR ?Inhale 1 puff into the lungs in the morning and at bedtime. ?  ?gabapentin 300 MG capsule ?Commonly known as: NEURONTIN ?Take 300 mg by mouth at bedtime. ?  ?lanthanum 500 MG chewable tablet ?Commonly known as: FOSRENOL ?Chew 1,500 mg by mouth 2 (two) times daily. ?  ?midodrine 5 MG tablet ?Commonly known as: PROAMATINE ?Take 5 mg by mouth See admin instructions. Takes 1 tablet by mouth only on dialysis days if systolic blood pressure less than 150. ?  ?multivitamin with minerals Tabs tablet ?Take 1 tablet by mouth daily. ?  ?oxyCODONE-acetaminophen 5-325 MG tablet ?Commonly known as: PERCOCET/ROXICET ?Take 1 tablet by mouth every 6 (six) hours as needed for moderate pain. ?  ?potassium chloride SA 20 MEQ tablet ?Commonly known as: KLOR-CON M ?Take 1 tablet (20 mEq total) by mouth daily. ?  ?pravastatin 40 MG tablet ?Commonly known as: PRAVACHOL ?Take 20 mg by mouth every evening. ?  ?predniSONE 5 MG tablet ?Commonly known as: DELTASONE ?Take 5 mg by mouth daily. ?  ?sulfamethoxazole-trimethoprim 400-80 MG  tablet ?Commonly known as: BACTRIM ?Take 1 tablet by mouth every evening. continuous ?  ?tacrolimus 0.5 MG capsule ?Commonly known as: PROGRAF ?Take 0.5 mg by mouth 2 (two) times daily. ?  ?traMADol 50 MG tablet ?Commonly known as: ULTRAM ?Take 50 mg by mouth 2 (two) times daily as needed for severe pain. ?  ?UNKNOWN TO PATIENT ?Inject 4-6 Units into the skin daily as needed (If blood sugar over 200). Sliding scale ?  ? ?  ? ?Activity: activity as tolerated ?Diet: renal diet ?Wound Care:  wound vac care per Dr. Sharol Given ? ?Follow-up with [facility] in 2 weeks. ? ?Signed: ?Dagoberto Ligas ?03/02/2022 ?8:42 AM ? ?

## 2022-03-21 ENCOUNTER — Ambulatory Visit (INDEPENDENT_AMBULATORY_CARE_PROVIDER_SITE_OTHER): Payer: Medicare Other | Admitting: Surgery

## 2022-03-21 ENCOUNTER — Encounter: Payer: Self-pay | Admitting: Surgery

## 2022-03-21 VITALS — BP 176/74 | HR 78 | Temp 97.9°F | Resp 20 | Ht 68.0 in | Wt 109.0 lb

## 2022-03-21 DIAGNOSIS — I7025 Atherosclerosis of native arteries of other extremities with ulceration: Secondary | ICD-10-CM

## 2022-03-21 NOTE — Progress Notes (Signed)
? ?Patient name: Tyler Mueller MRN: [id number] DOB: [birth year] Sex: male ? ?REASON FOR VISIT:  ? ? ? Post op ? ?HISTORY OF PRESENT ILLNESS:  ? ?Tyler Mueller is a 81 y.o. male who I met on 02/11/2022 at the request of Dr. Sharol Given for vascular evaluation in the setting of right third toe gangrene which had been present for 1 month.  He underwent angiography which revealed a right popliteal occlusion.  This was unable to be recanalized.  Therefore on 02/23/2022, he underwent a right femoral to below-knee popliteal bypass graft with 6 mm PTFE.  I also performed a common femoral and profundofemoral endarterectomy.  Dr. Sharol Given performed the toe amputation.   ? ?The patient suffers from diabetes.  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  He is on dialysis Tuesday Thursday Saturday. ? ?CURRENT MEDICATIONS:  ? ? ?Current Outpatient Medications  ?Medication Sig Dispense Refill  ? acetaminophen (TYLENOL) 500 MG tablet Take 500 mg by mouth every 6 (six) hours as needed for moderate pain.    ? acyclovir (ZOVIRAX) 200 MG capsule Take 4 capsules (800 mg total) by mouth daily. 28 capsule 0  ? Alpha-D-Galactosidase (BEANO PO) Take 1 tablet by mouth daily as needed (gas/bloating).    ? amLODipine (NORVASC) 5 MG tablet Take 5 mg by mouth daily. Hold for systolic bp less than 211.    ? aspirin EC 81 MG tablet Take 81 mg by mouth daily.    ? azithromycin (ZITHROMAX) 250 MG tablet MAY ADMINISTER WITHOUT REGARD TO MEALS (Patient taking differently: Take 250 mg by mouth every Monday, Wednesday, and Friday. continuous) 6 each 0  ? beta carotene w/minerals (OCUVITE) tablet Take 1 tablet by mouth daily.    ? calcium acetate (PHOSLO) 667 MG capsule Take 2,001 mg by mouth 2 (two) times daily with a meal.    ? Calcium-Magnesium-Zinc (CAL-MAG-ZINC PO) Take 1 tablet by mouth in the morning.    ? carvedilol (COREG) 25 MG tablet Take 25 mg by mouth daily. Take 1 tablet (25 mg) by mouth  in the evening on Tuesdays, Thursdays & Saturdays ( dialysis days)    ? famotidine (PEPCID) 20 MG tablet Take 20 mg by mouth in the morning.    ? ferrous gluconate (FERGON) 225 (27 FE) MG tablet Take 240 mg by mouth every evening.    ? fluticasone-salmeterol (ADVAIR) 250-50 MCG/ACT AEPB Inhale 1 puff into the lungs in the morning and at bedtime.    ? gabapentin (NEURONTIN) 300 MG capsule Take 300 mg by mouth at bedtime.    ? lanthanum (FOSRENOL) 500 MG chewable tablet Chew 1,500 mg by mouth 2 (two) times daily.    ? midodrine (PROAMATINE) 5 MG tablet Take 5 mg by mouth See admin instructions. Takes 1 tablet by mouth only on dialysis days if systolic blood pressure less than 150.    ? Multiple Vitamin (MULTIVITAMIN WITH MINERALS) TABS tablet Take 1 tablet by mouth daily.    ? oxyCODONE-acetaminophen (PERCOCET/ROXICET) 5-325 MG tablet Take 1 tablet by mouth every 6 (six) hours as needed for moderate pain. 30 tablet 0  ? potassium chloride SA (K-DUR,KLOR-CON) 20 MEQ tablet Take 1 tablet (20 mEq total) by mouth daily. 5 tablet 0  ? pravastatin (PRAVACHOL) 40 MG tablet Take 20 mg by mouth every evening.    ? predniSONE (DELTASONE) 5 MG tablet Take 5 mg by mouth daily.    ? sulfamethoxazole-trimethoprim (BACTRIM,SEPTRA) 400-80 MG per tablet Take  1 tablet by mouth every evening. continuous    ? tacrolimus (PROGRAF) 0.5 MG capsule Take 0.5 mg by mouth 2 (two) times daily.    ? traMADol (ULTRAM) 50 MG tablet Take 50 mg by mouth 2 (two) times daily as needed for severe pain.    ? UNKNOWN TO PATIENT Inject 4-6 Units into the skin daily as needed (If blood sugar over 200). Sliding scale    ? ?No current facility-administered medications for this visit.  ? ? ?REVIEW OF SYSTEMS:  ? ?[X] denotes positive finding, [ ] denotes negative finding ?Cardiac  Comments:  ?Chest pain or chest pressure:    ?Shortness of breath upon exertion:    ?Short of breath when lying flat:    ?Irregular heart rhythm:    ?Constitutional    ?Fever or  chills:    ? ? ?PHYSICAL EXAM:  ? ?Vitals:  ? 03/21/22 1023  ?BP: (!) 176/74  ?Pulse: 78  ?Resp: 20  ?Temp: 97.9 ?F (36.6 ?C)  ?SpO2: 98%  ?Weight: 109 lb (49.4 kg)  ?Height: 5' 8" (1.727 m)  ? ? ?GENERAL: The patient is a well-nourished male, in no acute distress. The vital signs are documented above. ?CARDIOVASCULAR: There is a regular rate and rhythm. ?PULMONARY: Non-labored respirations ?Bypass graft incisions are healing nicely.  There is a slight fullness in the right groin incision, which is likely a small fluid collection versus scar tissue.  There is no drainage or erythema.  He has triphasic dorsalis pedis Doppler signal and monophasic posterior tibial signal.  The amputation site appears to be healing nicely ? ?STUDIES:  ? ? ?none ? ? ?MEDICAL ISSUES:  ? ?Status post right femoral-popliteal bypass graft with Gore-Tex: The bypass graft is patent.  I plan on imaging this with ultrasound in 3 months. ? ?Toe amputation: I am sending the patient to see Dr. Sharol Given this week for wound evaluation and suture removal. ? ?Annamarie Major, IV, MD, FACS ?[facility] ?Tel [phone] ?Pager [phone] ? ? ?  ?

## 2022-03-23 ENCOUNTER — Encounter: Payer: Self-pay | Admitting: Family

## 2022-03-23 ENCOUNTER — Ambulatory Visit (INDEPENDENT_AMBULATORY_CARE_PROVIDER_SITE_OTHER): Payer: Medicare Other | Admitting: Family

## 2022-03-23 DIAGNOSIS — I96 Gangrene, not elsewhere classified: Secondary | ICD-10-CM

## 2022-03-23 NOTE — Progress Notes (Signed)
? ?Post-Op Visit Note ?  ?Patient: Tyler Mueller           ?Date of Birth: 03/31/1941           ?MRN: [id number] ?Visit Date: 03/23/2022 ?PCP: Mikey College, MD ? ?Chief Complaint:  ?Chief Complaint  ?Patient presents with  ? Right Foot - Routine Post Op  ?  02/23/22 3rd toe amputation ?Had revascularization w/ vascular and vein on RLE 02/23/22  ? ? ?HPI:  ?HPI ?The patient is a 81 year old gentleman who presents status post third toe amputation on March 22.  He did also have revascularization of the right lower extremity at the time of this amputation. ? ?Ortho Exam ?On examination his incision is well approximated sutures is healing well there is only 1 remaining area that has not yet healed over the dorsum of his foot this is proximal is about 1 cm in length 2 mm in width filled in with 100% granulation tissue there is very mild surrounding erythema no cellulitis ?Visit Diagnoses:  ?1. Gangrene of toe of right foot ([place])   ? ? ?Plan: He will continue with daily Dial soap cleansing and dry dressing changes.  Minimize weightbearing until this has healed he will follow-up in 3 more weeks ? ?Follow-Up Instructions: Return in about 3 weeks (around 04/13/2022).  ? ?Imaging: ?No results found. ? ?Orders:  ?No orders of the defined types were placed in this encounter. ? ?No orders of the defined types were placed in this encounter. ? ? ? ?PMFS History: ?Patient Active Problem List  ? Diagnosis Date Noted  ? Anemia 02/27/2022  ? Rigors 02/26/2022  ? ESRD on hemodialysis ([place]) 02/23/2022  ? Essential hypertension 02/23/2022  ? PVD (peripheral vascular disease) ([place]) s/p femoral-popliteal bypass surgery 02/23/2022  ? S/P femoral-popliteal bypass surgery 02/23/2022  ? 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 Immunocompromised ([place]) 11/29/2021  ? Hyperkalemia 11/26/2021  ? Clostridium difficile infection 04/27/2014  ? Herpes zoster 04/23/2014  ? Muscle spasm of back 04/21/2014  ? History of lung transplant ([place])  secondary to bronchiectasis 04/21/2014  ? Diverticulitis 04/20/2014  ? CKD (chronic kidney disease) stage 5, GFR less than 15 ml/min ([facility]) 04/20/2014  ? Type 2 diabetes mellitus with peripheral neuropathy ([place]) 01/04/2008  ? DYSLIPIDEMIA 01/04/2008  ? IMMUNOCOMPROMISED 01/04/2008  ? BRONCHIECTASIS 01/04/2008  ? DYSPNEA ON EXERTION 01/04/2008  ? Complications of transplanted lung 12/10/2007  ? ?Past Medical History:  ?Diagnosis Date  ? Diabetes mellitus without complication ([place])   ? Diverticulitis   ? ESRD on hemodialysis ([place])   ? RENAL INSUFFICIENCY  ? History of lung transplant ([place]) 04/21/2014  ? Hypertension   ? Lung transplanted ([place]) 12/05/2002  ? BILATERAL   ? Muscle spasm of back 04/21/2014  ? Skin cancer of face   ?  ?History reviewed. No pertinent family history.  ?Past Surgical History:  ?Procedure Laterality Date  ? ABDOMINAL AORTOGRAM W/LOWER EXTREMITY Right 02/22/2022  ? Procedure: ABDOMINAL AORTOGRAM W/LOWER EXTREMITY;  Surgeon: Serafina Mitchell, MD;  Location: [place];  Service: Cardiovascular;  Laterality: Right;  ? AMPUTATION TOE Right 02/23/2022  ? Procedure: AMPUTATION THIRD RIGHT TOE;  Surgeon: Serafina Mitchell, MD;  Location: [facility] OR;  Service: Vascular;  Laterality: Right;  ? APPENDECTOMY    ? APPLICATION OF WOUND VAC Right 02/23/2022  ? Procedure: APPLICATION OF WOUND VAC;  Surgeon: Serafina Mitchell, MD;  Location: [facility] OR;  Service: Vascular;  Laterality: Right;  ?  CATARACT EXTRACTION    ? ENDARTERECTOMY FEMORAL Right 02/23/2022  ? Procedure: RIGHT FEMORAL ENDARTERECTOMY;  Surgeon: Serafina Mitchell, MD;  Location: [facility] OR;  Service: Vascular;  Laterality: Right;  ? FEMORAL-POPLITEAL BYPASS GRAFT Right 02/23/2022  ? Procedure: RIGHT FEMORAL-POPLITEAL ARTERY BYPASS;  Surgeon: Serafina Mitchell, MD;  Location: [facility] OR;  Service: Vascular;  Laterality: Right;  ? LUNG TRANSPLANT, DOUBLE  2004  ? PERIPHERAL VASCULAR BALLOON ANGIOPLASTY Right 02/22/2022  ? Procedure: PERIPHERAL VASCULAR BALLOON  ANGIOPLASTY;  Surgeon: Serafina Mitchell, MD;  Location: [place] [facility];  Service: Cardiovascular;  Laterality: Right;  SFA unable to cross lesion  ? ?Social History  ? ?Occupational History  ? Not on file  ?Tobacco Use  ? Smoking status: Former  ?  Types: Cigarettes  ?  Quit date: 04/22/2003  ?  Years since quitting: 18.9  ? Smokeless tobacco: Never  ?Substance and Sexual Activity  ? Alcohol use: No  ?  Comment: QUIT ALCOHOL 2004  ? Drug use: No  ? Sexual activity: Not on file  ? ? ?

## 2022-03-25 ENCOUNTER — Other Ambulatory Visit: Payer: Self-pay | Admitting: *Deleted

## 2022-03-25 DIAGNOSIS — E08621 Diabetes mellitus due to underlying condition with foot ulcer: Secondary | ICD-10-CM

## 2022-03-25 DIAGNOSIS — I7025 Atherosclerosis of native arteries of other extremities with ulceration: Secondary | ICD-10-CM

## 2022-03-29 DIAGNOSIS — Z89421 Acquired absence of other right toe(s): Secondary | ICD-10-CM | POA: Insufficient documentation

## 2022-04-06 ENCOUNTER — Ambulatory Visit (INDEPENDENT_AMBULATORY_CARE_PROVIDER_SITE_OTHER): Payer: Medicare Other | Admitting: Family

## 2022-04-06 DIAGNOSIS — M869 Osteomyelitis, unspecified: Secondary | ICD-10-CM

## 2022-04-06 DIAGNOSIS — Z89421 Acquired absence of other right toe(s): Secondary | ICD-10-CM

## 2022-04-06 DIAGNOSIS — S98131A Complete traumatic amputation of one right lesser toe, initial encounter: Secondary | ICD-10-CM

## 2022-04-07 ENCOUNTER — Other Ambulatory Visit: Payer: Self-pay | Admitting: Family

## 2022-04-07 ENCOUNTER — Telehealth: Payer: Self-pay | Admitting: Family

## 2022-04-07 MED ORDER — DOXYCYCLINE HYCLATE 100 MG PO TABS: 100.0000 mg | ORAL_TABLET | Freq: Two times a day (BID) | ORAL | 0 refills | Status: DC

## 2022-04-07 NOTE — Telephone Encounter (Signed)
Called # provided below and no answer and VM not set up. ?

## 2022-04-07 NOTE — Telephone Encounter (Signed)
Patient's wife [name] called advised the antibiotic is not showing received at the pharmacy yet. [name] asked for a call when Rx is sent to the pharmacy. The number to contact is [phone] ?

## 2022-04-07 NOTE — Telephone Encounter (Signed)
Sending message to Junie Panning to check on abx. ?

## 2022-04-07 NOTE — Telephone Encounter (Signed)
Doxy '100mg'$  #60 sent to [facility]. ?

## 2022-04-08 ENCOUNTER — Encounter: Payer: Self-pay | Admitting: Family

## 2022-04-08 NOTE — Progress Notes (Signed)
? ?Office Visit Note ?  ?Patient: Tyler Mueller           ?Date of Birth: Aug 09, 1941           ?MRN: [id number] ?Visit Date: 04/06/2022 ?             ?Requested by: Mikey College, MD ?[facility] [address] ?[place],   [id number] ?PCP: Mikey College, MD ? ?Chief Complaint  ?Patient presents with  ? Right Foot - Routine Post Op  ?  02/23/22 right foot 3rd toe amputation  ? ? ? ? ?HPI: ?The patient is a 81 year old gentleman who presents today in follow-up for his right foot he is status post third ray amputation on March 22. He continues to have an area over the dorsum of his foot over the center of his incision that has not yet healed last time he was here this was scabbed over today is open and does probe.  Unfortunately since he was seen last in the office he did have an in hospitalization. ? ?Hospitalization for shortness of breath, volume overload.  He is also a hemodialysis patient.  While hospitalized he developed a fever it was thought that the source was infection in the operative foot.  MRI was performed which showed concern for osteomyelitis versus postsurgical changes.  Vascular surgery at [facility] felt that it was more likely the postsurgical changes.  He was placed on doxycycline and Augmentin from April 27 to May 1 he has completed this course now. ? ? ? ?While hospitalized there was discussion of further limb salvage surgery at that time the patient was quite hesitant ? ?Assessment & Plan: ?Visit Diagnoses: No diagnosis found. ? ?Plan: We will place the patient back on oral antibiotics.  He will pack his wound open with silver cell strips.  He and his wife voiced understanding.  Discussed return precautions and follow-up with Dr. Sharol Given in 2 weeks again discussed possibilities of further limb salvage surgery ? ?Follow-Up Instructions: Return in about 2 weeks (around 04/20/2022).  ? ?Ortho Exam ? ?Patient is alert, oriented, no adenopathy, well-dressed, normal affect, normal respiratory  effort. ?On examination of the right foot there is mild erythema surrounding an ulcer over his center of his incision over the dorsum of his foot this is about 3 mm in diameter does probe about a centimeter deep does not probe to bone there is no active drainage no odor no warmth ? ?Imaging: ?No results found. ?No images are attached to the encounter. ? ?Labs: ?Lab Results  ?Component Value Date  ? HGBA1C 5.8 (H) 02/23/2022  ? HGBA1C 4.9 11/27/2021  ? HGBA1C (H) 01/05/2011  ?  6.8 ?(NOTE)                                                                       According to the ADA Clinical Practice Recommendations for 2011, when HbA1c is used as a screening test:   >=6.5%   Diagnostic of Diabetes Mellitus           (if abnormal result ? is confirmed)  5.7-6.4%   Increased risk of developing Diabetes Mellitus  References:Diagnosis and Classification of Diabetes Mellitus,Diabetes LYYT,0354,65(KCLEX 1):S62-S69 and Standards of Medical Care in  Diabetes - 2011,Diabetes DXIP,3825,05  ?(Suppl 1):S11-S61.  ? REPTSTATUS 01/09/2011 FINAL 01/05/2011  ? CULT  01/05/2011  ?  NO SALMONELLA, SHIGELLA, CAMPYLOBACTER, OR YERSINIA ISOLATED  ? ? ? ?Lab Results  ?Component Value Date  ? ALBUMIN 2.5 (L) 02/26/2022  ? ALBUMIN 2.5 (L) 02/25/2022  ? ALBUMIN 3.4 (L) 02/04/2022  ? ? ?Lab Results  ?Component Value Date  ? MG 2.9 (H) 11/26/2021  ? MG 2.1 06/10/2021  ? MG 2.1 04/20/2014  ? ?Lab Results  ?Component Value Date  ? VD25OH 34 05/09/2011  ? ? ?No results found for: PREALBUMIN ? ?  Latest Ref Rng & Units 02/27/2022  ?  7:52 AM 02/26/2022  ?  4:31 AM 02/24/2022  ?  5:05 AM  ?CBC EXTENDED  ?WBC 4.0 - 10.5 K/uL 11.0   11.0   10.2    ?RBC 4.22 - 5.81 MIL/uL 2.57   2.49   2.73    ?Hemoglobin 13.0 - 17.0 g/dL 7.7   7.6   8.0    ?HCT 39.0 - 52.0 % 24.9   24.7   26.6    ?Platelets 150 - 400 K/uL 162   164   198    ? ? ? ?There is no height or weight on file to calculate BMI. ? ?Orders:  ?No orders of the defined types were placed in this  encounter. ? ?No orders of the defined types were placed in this encounter. ? ? ? Procedures: ?No procedures performed ? ?Clinical Data: ?No additional findings. ? ?ROS: ? ?All other systems negative, except as noted in the HPI. ?Review of Systems ? ?Objective: ?Vital Signs: There were no vitals taken for this visit. ? ?Specialty Comments:  ?No specialty comments available. ? ?PMFS History: ?Patient Active Problem List  ? Diagnosis Date Noted  ? Anemia 02/27/2022  ? Rigors 02/26/2022  ? ESRD on hemodialysis ([place]) 02/23/2022  ? Essential hypertension 02/23/2022  ? PVD (peripheral vascular disease) ([place]) s/p femoral-popliteal bypass surgery 02/23/2022  ? S/P femoral-popliteal bypass surgery 02/23/2022  ? 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 Immunocompromised ([place]) 11/29/2021  ? Hyperkalemia 11/26/2021  ? Clostridium difficile infection 04/27/2014  ? Herpes zoster 04/23/2014  ? Muscle spasm of back 04/21/2014  ? History of lung transplant ([place]) secondary to bronchiectasis 04/21/2014  ? Diverticulitis 04/20/2014  ? CKD (chronic kidney disease) stage 5, GFR less than 15 ml/min (HCC) 04/20/2014  ? Type 2 diabetes mellitus with peripheral neuropathy ([place]) 01/04/2008  ? DYSLIPIDEMIA 01/04/2008  ? IMMUNOCOMPROMISED 01/04/2008  ? BRONCHIECTASIS 01/04/2008  ? DYSPNEA ON EXERTION 01/04/2008  ? Complications of transplanted lung 12/10/2007  ? ?Past Medical History:  ?Diagnosis Date  ? Diabetes mellitus without complication ([place])   ? Diverticulitis   ? ESRD on hemodialysis ([place])   ? RENAL INSUFFICIENCY  ? History of lung transplant ([place]) 04/21/2014  ? Hypertension   ? Lung transplanted ([place]) 12/05/2002  ? BILATERAL   ? Muscle spasm of back 04/21/2014  ? Skin cancer of face   ?  ?History reviewed. No pertinent family history.  ?Past Surgical History:  ?Procedure Laterality Date  ? ABDOMINAL AORTOGRAM W/LOWER EXTREMITY Right 02/22/2022  ? Procedure: ABDOMINAL AORTOGRAM W/LOWER EXTREMITY;  Surgeon: Serafina Mitchell, MD;   Location: [facility];  Service: Cardiovascular;  Laterality: Right;  ? AMPUTATION TOE Right 02/23/2022  ? Procedure: AMPUTATION THIRD RIGHT TOE;  Surgeon: Serafina Mitchell, MD;  Location: [facility] OR;  Service: Vascular;  Laterality: Right;  ?  APPENDECTOMY    ? APPLICATION OF WOUND VAC Right 02/23/2022  ? Procedure: APPLICATION OF WOUND VAC;  Surgeon: Serafina Mitchell, MD;  Location: [facility] OR;  Service: Vascular;  Laterality: Right;  ? CATARACT EXTRACTION    ? ENDARTERECTOMY FEMORAL Right 02/23/2022  ? Procedure: RIGHT FEMORAL ENDARTERECTOMY;  Surgeon: Serafina Mitchell, MD;  Location: [facility] OR;  Service: Vascular;  Laterality: Right;  ? FEMORAL-POPLITEAL BYPASS GRAFT Right 02/23/2022  ? Procedure: RIGHT FEMORAL-POPLITEAL ARTERY BYPASS;  Surgeon: Serafina Mitchell, MD;  Location: [facility] OR;  Service: Vascular;  Laterality: Right;  ? LUNG TRANSPLANT, DOUBLE  2004  ? PERIPHERAL VASCULAR BALLOON ANGIOPLASTY Right 02/22/2022  ? Procedure: PERIPHERAL VASCULAR BALLOON ANGIOPLASTY;  Surgeon: Serafina Mitchell, MD;  Location: [facility];  Service: Cardiovascular;  Laterality: Right;  SFA unable to cross lesion  ? ?Social History  ? ?Occupational History  ? Not on file  ?Tobacco Use  ? Smoking status: Former  ?  Types: Cigarettes  ?  Quit date: 04/22/2003  ?  Years since quitting: 18.9  ? Smokeless tobacco: Never  ?Substance and Sexual Activity  ? Alcohol use: No  ?  Comment: QUIT ALCOHOL 2004  ? Drug use: No  ? Sexual activity: Not on file  ? ? ? ? ? ?

## 2022-04-20 ENCOUNTER — Ambulatory Visit: Payer: Self-pay

## 2022-04-20 ENCOUNTER — Encounter: Payer: Self-pay | Admitting: Family

## 2022-04-20 ENCOUNTER — Ambulatory Visit (INDEPENDENT_AMBULATORY_CARE_PROVIDER_SITE_OTHER): Payer: Medicare Other | Admitting: Family

## 2022-04-20 DIAGNOSIS — S98131A Complete traumatic amputation of one right lesser toe, initial encounter: Secondary | ICD-10-CM

## 2022-04-20 DIAGNOSIS — Z89421 Acquired absence of other right toe(s): Secondary | ICD-10-CM

## 2022-04-20 NOTE — Progress Notes (Signed)
? ?Office Visit Note ?  ?Patient: Tyler Mueller           ?Date of Birth: Jul 27, 1941           ?MRN: [id number] ?Visit Date: 04/20/2022 ?             ?Requested by: Mikey College, MD ?[facility] [address] ?[place],  [place] [address] ?PCP: Mikey College, MD ? ?Chief Complaint  ?Patient presents with  ? Right Foot - Routine Post Op  ?  02/23/22 3rd toe amputation  ? ? ? ? ?HPI: ?Is a 81 year old gentleman who presents in follow-up.  He is status post right third ray amputation and has had an area over his incision dorsum of his foot that has persistently been open this probes he has packed it off and on with silver cell strips he continues on doxycycline.  He did complete a Bactrim course in March as well ? ?The patient is adamant that we hold off on surgical intervention. ? ?Assessment & Plan: ?Visit Diagnoses:  ?1. Amputation of toe of right foot ([place])   ? ? ?Plan: Concern for stress fracture versus osteomyelitis of the fourth metatarsal head.   ? ?Continue with silver cell packing dry dressings continue doxycycline.  We will see him back about 3 days after he completes his doxycycline.  Request that he see Dr. Sharol Given.  Discussed further concern for foot concerned that he may require further limb salvage surgery at this time we will proceed with wound care and conservative measures discussed return precautions ? ?Follow-Up Instructions: Return in about 19 days (around 05/09/2022).  ? ?Ortho Exam ? ?Patient is alert, oriented, no adenopathy, well-dressed, normal affect, normal respiratory effort. ?On examination of the right foot over the dorsum he has a 2 mm in diameter open ulcer there is epiboly this probes about 5 mm deep there is no probing to bone or tendon there is some surrounding mild erythema this was marked with a pen ? ?Imaging: ?No results found. ?No images are attached to the encounter. ? ?Labs: ?Lab Results  ?Component Value Date  ? HGBA1C 5.8 (H) 02/23/2022  ? HGBA1C 4.9 11/27/2021  ? HGBA1C  (H) 01/05/2011  ?  6.8 ?(NOTE)                                                                       According to the ADA Clinical Practice Recommendations for 2011, when HbA1c is used as a screening test:   >=6.5%   Diagnostic of Diabetes Mellitus           (if abnormal result ? is confirmed)  5.7-6.4%   Increased risk of developing Diabetes Mellitus  References:Diagnosis and Classification of Diabetes Mellitus,Diabetes NIDP,8242,35(TIRWE 1):S62-S69 and Standards of Medical Care in         Diabetes - 2011,Diabetes RXVQ,0086,76  ?(Suppl 1):S11-S61.  ? REPTSTATUS 01/09/2011 FINAL 01/05/2011  ? CULT  01/05/2011  ?  NO SALMONELLA, SHIGELLA, CAMPYLOBACTER, OR YERSINIA ISOLATED  ? ? ? ?Lab Results  ?Component Value Date  ? ALBUMIN 2.5 (L) 02/26/2022  ? ALBUMIN 2.5 (L) 02/25/2022  ? ALBUMIN 3.4 (L) 02/04/2022  ? ? ?Lab Results  ?Component Value Date  ? MG 2.9 (H)  11/26/2021  ? MG 2.1 06/10/2021  ? MG 2.1 04/20/2014  ? ?Lab Results  ?Component Value Date  ? VD25OH 34 05/09/2011  ? ? ?No results found for: PREALBUMIN ? ?  Latest Ref Rng & Units 02/27/2022  ?  7:52 AM 02/26/2022  ?  4:31 AM 02/24/2022  ?  5:05 AM  ?CBC EXTENDED  ?WBC 4.0 - 10.5 K/uL 11.0   11.0   10.2    ?RBC 4.22 - 5.81 MIL/uL 2.57   2.49   2.73    ?Hemoglobin 13.0 - 17.0 g/dL 7.7   7.6   8.0    ?HCT 39.0 - 52.0 % 24.9   24.7   26.6    ?Platelets 150 - 400 K/uL 162   164   198    ? ? ? ?There is no height or weight on file to calculate BMI. ? ?Orders:  ?Orders Placed This Encounter  ?Procedures  ? XR Foot Complete Right  ? ?No orders of the defined types were placed in this encounter. ? ? ? Procedures: ?No procedures performed ? ?Clinical Data: ?No additional findings. ? ?ROS: ? ?All other systems negative, except as noted in the HPI. ?Review of Systems ? ?Objective: ?Vital Signs: There were no vitals taken for this visit. ? ?Specialty Comments:  ?No specialty comments available. ? ?PMFS History: ?Patient Active Problem List  ? Diagnosis Date Noted  ? Anemia  02/27/2022  ? Rigors 02/26/2022  ? ESRD on hemodialysis ([place]) 02/23/2022  ? Essential hypertension 02/23/2022  ? PVD (peripheral vascular disease) ([place]) s/p femoral-popliteal bypass surgery 02/23/2022  ? S/P femoral-popliteal bypass surgery 02/23/2022  ? 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 Immunocompromised ([place]) 11/29/2021  ? Hyperkalemia 11/26/2021  ? Clostridium difficile infection 04/27/2014  ? Herpes zoster 04/23/2014  ? Muscle spasm of back 04/21/2014  ? History of lung transplant ([place]) secondary to bronchiectasis 04/21/2014  ? Diverticulitis 04/20/2014  ? CKD (chronic kidney disease) stage 5, GFR less than 15 ml/min (HCC) 04/20/2014  ? Type 2 diabetes mellitus with peripheral neuropathy ([place]) 01/04/2008  ? DYSLIPIDEMIA 01/04/2008  ? IMMUNOCOMPROMISED 01/04/2008  ? BRONCHIECTASIS 01/04/2008  ? DYSPNEA ON EXERTION 01/04/2008  ? Complications of transplanted lung 12/10/2007  ? ?Past Medical History:  ?Diagnosis Date  ? Diabetes mellitus without complication ([place])   ? Diverticulitis   ? ESRD on hemodialysis ([place])   ? RENAL INSUFFICIENCY  ? History of lung transplant ([place]) 04/21/2014  ? Hypertension   ? Lung transplanted ([place]) 12/05/2002  ? BILATERAL   ? Muscle spasm of back 04/21/2014  ? Skin cancer of face   ?  ?No family history on file.  ?Past Surgical History:  ?Procedure Laterality Date  ? ABDOMINAL AORTOGRAM W/LOWER EXTREMITY Right 02/22/2022  ? Procedure: ABDOMINAL AORTOGRAM W/LOWER EXTREMITY;  Surgeon: Serafina Mitchell, MD;  Location: [facility];  Service: Cardiovascular;  Laterality: Right;  ? AMPUTATION TOE Right 02/23/2022  ? Procedure: AMPUTATION THIRD RIGHT TOE;  Surgeon: Serafina Mitchell, MD;  Location: [facility] OR;  Service: Vascular;  Laterality: Right;  ? APPENDECTOMY    ? APPLICATION OF WOUND VAC Right 02/23/2022  ? Procedure: APPLICATION OF WOUND VAC;  Surgeon: Serafina Mitchell, MD;  Location: [facility] OR;  Service: Vascular;  Laterality: Right;  ? CATARACT EXTRACTION    ?  ENDARTERECTOMY FEMORAL Right 02/23/2022  ? Procedure: RIGHT FEMORAL ENDARTERECTOMY;  Surgeon: Serafina Mitchell, MD;  Location[facility] OR;  Service: Vascular;  Laterality: Right;  ?  FEMORAL-POPLITEAL BYPASS GRAFT Right 02/23/2022  ? Procedure: RIGHT FEMORAL-POPLITEAL ARTERY BYPASS;  Surgeon: Serafina Mitchell, MD;  Location[facility] OR;  Service: Vascular;  Laterality: Right;  ? LUNG TRANSPLANT, DOUBLE  2004  ? PERIPHERAL VASCULAR BALLOON ANGIOPLASTY Right 02/22/2022  ? Procedure: PERIPHERAL VASCULAR BALLOON ANGIOPLASTY;  Surgeon: Serafina Mitchell, MD;  Location: [facility];  Service: Cardiovascular;  Laterality: Right;  SFA unable to cross lesion  ? ?Social History  ? ?Occupational History  ? Not on file  ?Tobacco Use  ? Smoking status: Former  ?  Types: Cigarettes  ?  Quit date: 04/22/2003  ?  Years since quitting: 19.0  ? Smokeless tobacco: Never  ?Substance and Sexual Activity  ? Alcohol use: No  ?  Comment: QUIT ALCOHOL 2004  ? Drug use: No  ? Sexual activity: Not on file  ? ? ? ? ? ?

## 2022-04-28 ENCOUNTER — Emergency Department (HOSPITAL_COMMUNITY): Payer: Medicare Other

## 2022-04-28 ENCOUNTER — Encounter (HOSPITAL_COMMUNITY): Payer: Self-pay

## 2022-04-28 ENCOUNTER — Inpatient Hospital Stay (HOSPITAL_COMMUNITY)
Admission: EM | Admit: 2022-04-28 | Discharge: 2022-05-01 | DRG: 871 | Disposition: A | Discharge status: Other Acute Inpatient Hospital | Payer: Medicare Other | Source: Ambulatory Visit | Attending: Internal Medicine | Admitting: Internal Medicine

## 2022-04-28 ENCOUNTER — Other Ambulatory Visit: Payer: Self-pay

## 2022-04-28 DIAGNOSIS — Z992 Dependence on renal dialysis: Secondary | ICD-10-CM | POA: Diagnosis not present

## 2022-04-28 DIAGNOSIS — G9341 Metabolic encephalopathy: Secondary | ICD-10-CM | POA: Diagnosis not present

## 2022-04-28 DIAGNOSIS — D631 Anemia in chronic kidney disease: Secondary | ICD-10-CM | POA: Diagnosis present

## 2022-04-28 DIAGNOSIS — G9340 Encephalopathy, unspecified: Secondary | ICD-10-CM | POA: Diagnosis not present

## 2022-04-28 DIAGNOSIS — A419 Sepsis, unspecified organism: Principal | ICD-10-CM | POA: Diagnosis present

## 2022-04-28 DIAGNOSIS — M898X9 Other specified disorders of bone, unspecified site: Secondary | ICD-10-CM | POA: Diagnosis present

## 2022-04-28 DIAGNOSIS — Z794 Long term (current) use of insulin: Secondary | ICD-10-CM

## 2022-04-28 DIAGNOSIS — Z89421 Acquired absence of other right toe(s): Secondary | ICD-10-CM | POA: Diagnosis not present

## 2022-04-28 DIAGNOSIS — I953 Hypotension of hemodialysis: Secondary | ICD-10-CM | POA: Diagnosis not present

## 2022-04-28 DIAGNOSIS — Z95820 Peripheral vascular angioplasty status with implants and grafts: Secondary | ICD-10-CM

## 2022-04-28 DIAGNOSIS — Z1152 Encounter for screening for COVID-19: Secondary | ICD-10-CM

## 2022-04-28 DIAGNOSIS — N186 End stage renal disease: Secondary | ICD-10-CM

## 2022-04-28 DIAGNOSIS — N2581 Secondary hyperparathyroidism of renal origin: Secondary | ICD-10-CM | POA: Diagnosis present

## 2022-04-28 DIAGNOSIS — R6520 Severe sepsis without septic shock: Secondary | ICD-10-CM | POA: Diagnosis not present

## 2022-04-28 DIAGNOSIS — Z7982 Long term (current) use of aspirin: Secondary | ICD-10-CM | POA: Diagnosis not present

## 2022-04-28 DIAGNOSIS — Z955 Presence of coronary angioplasty implant and graft: Secondary | ICD-10-CM

## 2022-04-28 DIAGNOSIS — J9601 Acute respiratory failure with hypoxia: Secondary | ICD-10-CM | POA: Diagnosis not present

## 2022-04-28 DIAGNOSIS — Z885 Allergy status to narcotic agent status: Secondary | ICD-10-CM

## 2022-04-28 DIAGNOSIS — Z942 Lung transplant status: Secondary | ICD-10-CM

## 2022-04-28 DIAGNOSIS — D849 Immunodeficiency, unspecified: Secondary | ICD-10-CM | POA: Diagnosis not present

## 2022-04-28 DIAGNOSIS — Z880 Allergy status to penicillin: Secondary | ICD-10-CM

## 2022-04-28 DIAGNOSIS — Z833 Family history of diabetes mellitus: Secondary | ICD-10-CM

## 2022-04-28 DIAGNOSIS — Z85828 Personal history of other malignant neoplasm of skin: Secondary | ICD-10-CM

## 2022-04-28 DIAGNOSIS — D84821 Immunodeficiency due to drugs: Secondary | ICD-10-CM | POA: Diagnosis present

## 2022-04-28 DIAGNOSIS — Z79899 Other long term (current) drug therapy: Secondary | ICD-10-CM

## 2022-04-28 DIAGNOSIS — Z886 Allergy status to analgesic agent status: Secondary | ICD-10-CM

## 2022-04-28 DIAGNOSIS — Z8249 Family history of ischemic heart disease and other diseases of the circulatory system: Secondary | ICD-10-CM

## 2022-04-28 DIAGNOSIS — E875 Hyperkalemia: Secondary | ICD-10-CM | POA: Diagnosis not present

## 2022-04-28 DIAGNOSIS — Z89431 Acquired absence of right foot: Secondary | ICD-10-CM

## 2022-04-28 DIAGNOSIS — R54 Age-related physical debility: Secondary | ICD-10-CM | POA: Diagnosis present

## 2022-04-28 DIAGNOSIS — I120 Hypertensive chronic kidney disease with stage 5 chronic kidney disease or end stage renal disease: Secondary | ICD-10-CM | POA: Diagnosis present

## 2022-04-28 DIAGNOSIS — R34 Anuria and oliguria: Secondary | ICD-10-CM | POA: Diagnosis present

## 2022-04-28 DIAGNOSIS — D696 Thrombocytopenia, unspecified: Secondary | ICD-10-CM | POA: Diagnosis present

## 2022-04-28 DIAGNOSIS — I739 Peripheral vascular disease, unspecified: Secondary | ICD-10-CM | POA: Diagnosis not present

## 2022-04-28 DIAGNOSIS — E1122 Type 2 diabetes mellitus with diabetic chronic kidney disease: Secondary | ICD-10-CM | POA: Diagnosis present

## 2022-04-28 DIAGNOSIS — I2510 Atherosclerotic heart disease of native coronary artery without angina pectoris: Secondary | ICD-10-CM | POA: Diagnosis present

## 2022-04-28 DIAGNOSIS — Z87891 Personal history of nicotine dependence: Secondary | ICD-10-CM | POA: Diagnosis not present

## 2022-04-28 DIAGNOSIS — R233 Spontaneous ecchymoses: Secondary | ICD-10-CM | POA: Diagnosis not present

## 2022-04-28 LAB — CBC WITH DIFFERENTIAL/PLATELET
Abs Immature Granulocytes: 0.24 10*3/uL — ABNORMAL HIGH (ref 0.00–0.07)
Basophils Absolute: 0.1 10*3/uL (ref 0.0–0.1)
Basophils Relative: 1 %
Eosinophils Absolute: 0.1 10*3/uL (ref 0.0–0.5)
Eosinophils Relative: 2 %
HCT: 46.2 % (ref 39.0–52.0)
Hemoglobin: 13.9 g/dL (ref 13.0–17.0)
Immature Granulocytes: 4 %
Lymphocytes Relative: 17 %
Lymphs Abs: 0.9 10*3/uL (ref 0.7–4.0)
MCH: 30.4 pg (ref 26.0–34.0)
MCHC: 30.1 g/dL (ref 30.0–36.0)
MCV: 101.1 fL — ABNORMAL HIGH (ref 80.0–100.0)
Monocytes Absolute: 0.5 10*3/uL (ref 0.1–1.0)
Monocytes Relative: 8 %
Neutro Abs: 3.8 10*3/uL (ref 1.7–7.7)
Neutrophils Relative %: 68 %
Platelets: 123 10*3/uL — ABNORMAL LOW (ref 150–400)
RBC: 4.57 MIL/uL (ref 4.22–5.81)
RDW: 15.9 % — ABNORMAL HIGH (ref 11.5–15.5)
WBC: 5.6 10*3/uL (ref 4.0–10.5)
nRBC: 0 % (ref 0.0–0.2)

## 2022-04-28 LAB — COMPREHENSIVE METABOLIC PANEL
ALT: 21 U/L (ref 0–44)
AST: 39 U/L (ref 15–41)
Albumin: 3.5 g/dL (ref 3.5–5.0)
Alkaline Phosphatase: 83 U/L (ref 38–126)
Anion gap: 13 (ref 5–15)
BUN: 16 mg/dL (ref 8–23)
CO2: 29 mmol/L (ref 22–32)
Calcium: 7.8 mg/dL — ABNORMAL LOW (ref 8.9–10.3)
Chloride: 98 mmol/L (ref 98–111)
Creatinine, Ser: 3.63 mg/dL — ABNORMAL HIGH (ref 0.61–1.24)
GFR, Estimated: 16 mL/min — ABNORMAL LOW (ref >60)
Glucose, Bld: 100 mg/dL — ABNORMAL HIGH (ref 70–99)
Potassium: 5.1 mmol/L (ref 3.5–5.1)
Sodium: 140 mmol/L (ref 135–145)
Total Bilirubin: 0.8 mg/dL (ref 0.3–1.2)
Total Protein: 6.9 g/dL (ref 6.5–8.1)

## 2022-04-28 LAB — LACTIC ACID, PLASMA
Lactic Acid, Venous: 2 mmol/L (ref 0.5–1.9)
Lactic Acid, Venous: 1.1 mmol/L (ref 0.5–1.9)

## 2022-04-28 LAB — PROTIME-INR
INR: 1.1 (ref 0.8–1.2)
Prothrombin Time: 13.9 seconds (ref 11.4–15.2)

## 2022-04-28 LAB — APTT: aPTT: 31 seconds (ref 24–36)

## 2022-04-28 LAB — RESP PANEL BY RT-PCR (FLU A&B, COVID) ARPGX2
Influenza A by PCR: NEGATIVE
Influenza B by PCR: NEGATIVE
SARS Coronavirus 2 by RT PCR: NEGATIVE

## 2022-04-28 LAB — MRSA NEXT GEN BY PCR, NASAL: MRSA by PCR Next Gen: NOT DETECTED

## 2022-04-28 MED ORDER — ACETAMINOPHEN 325 MG PO TABS: 650.0000 mg | ORAL_TABLET | Freq: Four times a day (QID) | ORAL | Status: DC | PRN

## 2022-04-28 MED ORDER — HEPARIN SODIUM (PORCINE) 5000 UNIT/ML IJ SOLN
5000.0000 [IU] | Freq: Three times a day (TID) | INTRAMUSCULAR | Status: DC
  Administered 2022-04-29 – 2022-05-01 (×6): 5000 [IU] via SUBCUTANEOUS
  Filled 2022-04-28 (×6): qty 1

## 2022-04-28 MED ORDER — VANCOMYCIN VARIABLE DOSE PER UNSTABLE RENAL FUNCTION (PHARMACIST DOSING): Status: DC

## 2022-04-28 MED ORDER — METRONIDAZOLE 500 MG/100ML IV SOLN
500.0000 mg | Freq: Once | INTRAVENOUS | Status: AC
  Administered 2022-04-28: 500 mg via INTRAVENOUS
  Filled 2022-04-28: qty 100

## 2022-04-28 MED ORDER — ACETAMINOPHEN 325 MG PO TABS
650.0000 mg | ORAL_TABLET | Freq: Once | ORAL | Status: AC
  Administered 2022-04-28: 650 mg via ORAL
  Filled 2022-04-28: qty 2

## 2022-04-28 MED ORDER — SODIUM CHLORIDE 0.9 % IV SOLN
1.0000 g | INTRAVENOUS | Status: DC
  Administered 2022-04-29 – 2022-04-30 (×2): 1 g via INTRAVENOUS
  Filled 2022-04-28 (×3): qty 10

## 2022-04-28 MED ORDER — VANCOMYCIN HCL IN DEXTROSE 1-5 GM/200ML-% IV SOLN
1000.0000 mg | Freq: Once | INTRAVENOUS | Status: AC
  Administered 2022-04-28: 1000 mg via INTRAVENOUS
  Filled 2022-04-28: qty 200

## 2022-04-28 MED ORDER — SENNOSIDES-DOCUSATE SODIUM 8.6-50 MG PO TABS: 1.0000 | ORAL_TABLET | Freq: Every evening | ORAL | Status: DC | PRN

## 2022-04-28 MED ORDER — ACETAMINOPHEN 650 MG RE SUPP: 650.0000 mg | Freq: Four times a day (QID) | RECTAL | Status: DC | PRN

## 2022-04-28 MED ORDER — SODIUM CHLORIDE 0.9 % IV SOLN
2.0000 g | Freq: Once | INTRAVENOUS | Status: AC
  Administered 2022-04-28: 2 g via INTRAVENOUS
  Filled 2022-04-28: qty 12.5

## 2022-04-28 NOTE — Progress Notes (Signed)
Pharmacy Antibiotic Note  Tyler Mueller is a 81 y.o. male admitted on 04/28/2022 with sepsis.  Pharmacy has been consulted for vancomycin and cefepime dosing.  LA 2.0, WBC 5.6, Tmax 100.4, patient is HD at baseline.   Last HD 04/28/24 but only completed partial session due to hypotension.   Plan: Cefepime 2g in ED. Will switch to 1g q24h given hypoTN with HD.  F/u HD schedule and ability to switch to 2g q HD  Vancomycin 1g then vancomycin variable Metronidazole 500 mg x1 per MD    No data recorded.  No results for input(s): WBC, CREATININE, LATICACIDVEN, VANCOTROUGH, VANCOPEAK, VANCORANDOM, GENTTROUGH, GENTPEAK, GENTRANDOM, TOBRATROUGH, TOBRAPEAK, TOBRARND, AMIKACINPEAK, AMIKACINTROU, AMIKACIN in the last 168 hours.  CrCl cannot be calculated (Patient's most recent lab result is older than the maximum 21 days allowed.).    Allergies  Allergen Reactions   Lorazepam Other (See Comments)    delirium    Morphine And Related Nausea And Vomiting and Other (See Comments)    Hallucinations    Nsaids Other (See Comments)     Kidney disorder   Penicillins Nausea And Vomiting and Other (See Comments)    Hallucinations    Amphetamine-Dextroamphetamine Anxiety and Other (See Comments)    confusion     Antimicrobials this admission: 5/25 cefepime >>  5/25 vancomycin >>   Dose adjustments this admission: N/A  Microbiology results: 5/25 BCx: sent 5/25 UCx: sent  5/25 MRSA PCR: sent  Thank you for allowing pharmacy to be a part of this patient's care.  Anderson Malta A Eleanor Gatliff 04/28/2022 4:21 PM

## 2022-04-28 NOTE — H&P (Signed)
Date: 04/29/2022               Patient Name:  Tyler Mueller MRN: [id number]  DOB: [birth year] Age / Sex: 81 y.o., male   PCP: Mikey College, MD         Medical Service: Internal Medicine Teaching Service         Attending Physician: Dr. Lottie Mussel, MD    First Contact: Lajean Manes, MD Pager: MP [phone]  Second Contact: Linwood Dibbles, MD Pager: PA [phone]       After Hours (After 5p/  First Contact Pager: [phone]  weekends / holidays): Second Contact Pager: [phone]   SUBJECTIVE  Chief Complaint: altered mental status  History of Present Illness: Tyler Mueller is a 81 y.o. male with a pertinent PMH of COPD s/p lung transplant in 2004, ESRD on HD TThS, CAD s/p DESx3, osteomyelitis s/p amputation of right third ray who presents to [facility] with fever and altered mental status. The patient was at HD today when he was noted to be tachycardic, febrile to 102F, and hypertensive. The patient did not complete a full HD session today due to his elevated BP. EMS was called and the patient was also noted to be hypoxic in the 80s.   When asking the patient what brought him in, he states that he came to get his foot checked out because "it doesn't feel right". He denies any drainage or pain around the surgical site. Patient had his right third ray amputated approximately 3 weeks ago with Dr. Sharol Given. He believes he may have had a fever and is currently endorsing chills. He denies any dizziness, lightheadedness, chest pain, shortness of breath, abdominal pain, nausea/vomiting, diarrhea, hematochezia; lower extremity swelling. The patient states that he feels "like crap" and has felt poorly for the last few days.   In the ED, the patient was hypertensive with SBP initially in the 180s, febrile to 100.4, and tachycardic with rates in the 100s. CBC was without leukocytosis or anemia. CMP with no liver function abnormalities and electrolytes within normal limits. Lactic acid initially elevated to 2.0  and improved to 1.1.   Medications: No current facility-administered medications on file prior to encounter.   Current Outpatient Medications on File Prior to Encounter  Medication Sig Dispense Refill   acetaminophen (TYLENOL) 325 MG tablet Take 650 mg by mouth every 6 (six) hours as needed for moderate pain.     albuterol (PROVENTIL) (2.5 MG/3ML) 0.083% nebulizer solution Take 2.5 mg by nebulization every 6 (six) hours as needed for wheezing or shortness of breath.     Alpha-D-Galactosidase (BEANO PO) Take 1 tablet by mouth daily as needed (gas/bloating).     amLODipine (NORVASC) 5 MG tablet Take 5 mg by mouth daily. Hold for systolic bp less than 518.     aspirin EC 81 MG tablet Take 81 mg by mouth daily.     azithromycin (ZITHROMAX) 250 MG tablet MAY ADMINISTER WITHOUT REGARD TO MEALS (Patient taking differently: Take 250 mg by mouth every Monday, Wednesday, and Friday. continuous) 6 each 0   beta carotene w/minerals (OCUVITE) tablet Take 1 tablet by mouth daily.     Calcium Carb-Cholecalciferol (CALCIUM 600 + D PO) Take 1 tablet by mouth daily with lunch.     carvedilol (COREG) 25 MG tablet Take 25 mg by mouth daily. Take 1 tablet (25 mg) by mouth in the evening on Tuesdays, Thursdays & Saturdays ( dialysis days)     famotidine (PEPCID)  20 MG tablet Take 20 mg by mouth in the morning.     Ferrous Gluconate 324 (37.5 Fe) MG TABS Take 324 mg by mouth every evening.     gabapentin (NEURONTIN) 300 MG capsule Take 300 mg by mouth at bedtime.     Insulin Aspart (NOVOLOG FLEXPEN [place]) Inject 0-5 Units into the skin See admin instructions. 3 times daily per sliding scales < = 0 units 201-250=1 unit 251-300 = 2 units 301-350=  units 351-400= 4 units >400 5 units     lanthanum (FOSRENOL) 500 MG chewable tablet Chew 3,000 mg by mouth 2 (two) times daily.     midodrine (PROAMATINE) 5 MG tablet Take 5 mg by mouth See admin instructions. Takes 1 tablet by mouth only on dialysis days if systolic blood  pressure less than 150.     Multiple Vitamin (MULTIVITAMIN WITH MINERALS) TABS tablet Take 1 tablet by mouth daily.     pravastatin (PRAVACHOL) 40 MG tablet Take 20 mg by mouth every evening.     predniSONE (DELTASONE) 5 MG tablet Take 5 mg by mouth daily.     senna (SENOKOT) 8.6 MG TABS tablet Take 1 tablet by mouth daily as needed for mild constipation.     sulfamethoxazole-trimethoprim (BACTRIM,SEPTRA) 400-80 MG per tablet Take 1 tablet by mouth every evening. continuous     tacrolimus (PROGRAF) 0.5 MG capsule Take 0.5 mg by mouth 2 (two) times daily.     traMADol (ULTRAM) 50 MG tablet Take 50 mg by mouth 2 (two) times daily as needed for severe pain.     acyclovir (ZOVIRAX) 200 MG capsule Take 4 capsules (800 mg total) by mouth daily. (Patient not taking: Reported on 04/28/2022) 28 capsule 0   doxycycline (VIBRA-TABS) 100 MG tablet Take 1 tablet (100 mg total) by mouth 2 (two) times daily. (Patient not taking: Reported on 04/28/2022) 60 tablet 0   lidocaine-prilocaine (EMLA) cream SMARTSIG:sparingly Topical     oxyCODONE-acetaminophen (PERCOCET/ROXICET) 5-325 MG tablet Take 1 tablet by mouth every 6 (six) hours as needed for moderate pain. (Patient not taking: Reported on 04/28/2022) 30 tablet 0   potassium chloride SA (K-DUR,KLOR-CON) 20 MEQ tablet Take 1 tablet (20 mEq total) by mouth daily. (Patient not taking: Reported on 04/28/2022) 5 tablet 0    Past Medical History:  Past Medical History:  Diagnosis Date   Diabetes mellitus without complication ([place])    Diverticulitis    ESRD on hemodialysis ([place])    RENAL INSUFFICIENCY   History of lung transplant ([place]) 04/21/2014   Hypertension    Lung transplanted ([place]) 12/05/2002   BILATERAL    Muscle spasm of back 04/21/2014   Skin cancer of face     Social:  Lives - with wife at home and she helps the patient with medication administration and cooking. He uses both a cane and walker for ambulation PCP is Dr Doy Mince at the [place] [facility]  [facility] - Prior tobacco use of 1/2ppd for 30 years; quit 20 years ago. Denies alcohol use or illicit substance use  Family History: Hypertension Diabetes - sister  No family history of cancer   Allergies: Allergies as of 04/28/2022 - Review Complete 04/28/2022  Allergen Reaction Noted   Lorazepam Other (See Comments) 06/11/2017   Morphine and related Nausea And Vomiting and Other (See Comments) 04/20/2014   Nsaids Other (See Comments) 02/04/2022   Penicillins Nausea And Vomiting and Other (See Comments)    Amphetamine-dextroamphetamine Anxiety and Other (See Comments) 05/20/2019  Review of Systems: A complete ROS was negative except as per HPI.   OBJECTIVE:  Physical Exam: Blood pressure (!) 141/57, pulse (!) 113, temperature 99.7 F (37.6 C), temperature source Oral, resp. rate (!) 31, height '5\' 8"'$  (1.727 m), weight 53.4 kg, SpO2 90 %.  General: Pleasant, well-developed elderly male laying in bed. No acute distress. Head: Normocephalic. Atraumatic. CV: RRR. No murmurs, rubs, or gallops. No LE edema Pulmonary: Lungs with coarse breath sounds bilaterally. Normal effort/work of breathing on room air.  Abdominal: Soft, nontender, nondistended. Normal bowel sounds. Extremities: Palpable radial and DP pulses. L forearm AVF with + thrill Skin: Warm and dry. Scattered ecchymosis over chest, arms, and legs. Erythematous area of ulceration over right anterior shoulder, no active drainage.  Neuro: Alert and oriented to self, place, and time (knows the year but believes it is July).  CN II-XII intact. 5/5 strength bilateral upper and lower extremities. Normal finger to nose testing bilaterally.  Psych: Normal mood and affect   Pertinent Labs: CBC    Component Value Date/Time   WBC 5.6 04/28/2022 1638   RBC 4.57 04/28/2022 1638   HGB 13.9 04/28/2022 1638   HCT 46.2 04/28/2022 1638   PLT 123 (L) 04/28/2022 1638   MCV 101.1 (H) 04/28/2022 1638   MCH 30.4 04/28/2022 1638   MCHC 30.1  04/28/2022 1638   RDW 15.9 (H) 04/28/2022 1638   LYMPHSABS 0.9 04/28/2022 1638   MONOABS 0.5 04/28/2022 1638   EOSABS 0.1 04/28/2022 1638   BASOSABS 0.1 04/28/2022 1638     CMP     Component Value Date/Time   NA 140 04/28/2022 1638   K 5.1 04/28/2022 1638   CL 98 04/28/2022 1638   CO2 29 04/28/2022 1638   GLUCOSE 100 (H) 04/28/2022 1638   GLUCOSE 104 (H) 10/31/2006 0755   BUN 16 04/28/2022 1638   CREATININE 3.63 (H) 04/28/2022 1638   CALCIUM 7.8 (L) 04/28/2022 1638   CALCIUM 8.5 05/09/2011 1339   PROT 6.9 04/28/2022 1638   ALBUMIN 3.5 04/28/2022 1638   AST 39 04/28/2022 1638   ALT 21 04/28/2022 1638   ALKPHOS 83 04/28/2022 1638   BILITOT 0.8 04/28/2022 1638   GFRNONAA 16 (L) 04/28/2022 1638   GFRAA 6 (L) 07/16/2015 2330    Pertinent Imaging: CT Head Wo Contrast  Result Date: 04/28/2022 CLINICAL DATA:  Mental status change of unknown cause. Recent diagnosis with sepsis. EXAM: CT HEAD WITHOUT CONTRAST TECHNIQUE: Contiguous axial images were obtained from the base of the skull through the vertex without intravenous contrast. RADIATION DOSE REDUCTION: This exam was performed according to the departmental dose-optimization program which includes automated exposure control, adjustment of the mA and/or kV according to patient size and/or use of iterative reconstruction technique. COMPARISON:  11/26/2021 FINDINGS: Brain: Age related atrophy. Chronic small-vessel ischemic changes of the white matter. 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 No sign of acute infarction, mass lesion, hemorrhage, hydrocephalus or extra-axial collection. Vascular: There is atherosclerotic calcification of the major vessels at the base of the brain. Skull: Negative Sinuses/Orbits: Clear/normal Other: None IMPRESSION: No acute finding. Atrophy and chronic small-vessel ischemic changes. Electronically Signed   By: Nelson Chimes M.D.   On: 04/28/2022 18:53   DG Chest Port 1 View  Result Date: 04/28/2022 CLINICAL DATA:   Questionable sepsis. EXAM: PORTABLE CHEST 1 VIEW COMPARISON:  Chest x-ray 04/28/2022. FINDINGS: Patient is status post cardiac surgery as seen on the prior study. There is stable enlargement of the cardiomediastinal silhouette. There is  increasing central pulmonary vascular congestion. There is a new small left pleural effusion. No pneumothorax or focal lung consolidation. No acute fractures are seen. IMPRESSION: 1. Cardiomegaly with increasing pulmonary vascular congestion and new small left pleural effusion. Electronically Signed   By: Ronney Asters M.D.   On: 04/28/2022 17:11   DG Foot Complete Right  Result Date: 04/28/2022 CLINICAL DATA:  Sepsis EXAM: RIGHT FOOT COMPLETE - 3+ VIEW COMPARISON:  Outside hospital [facility]) radiographs dated 04/20/2022 FINDINGS: 3rd digit amputation at the level of the mid metatarsal shaft. 4th distal metacarpal shaft fracture, healing, unchanged. Lucency along the forefoot (in the region of prior 3rd metatarsal resection) may correspond to the patient's known soft tissue wound. Wound is poorly visualized but appears superficial on the lateral view. No definite cortical destruction to suggest osteomyelitis radiographically, noting limited evaluation. No fracture is seen. Extensive vascular calcifications. IMPRESSION: No radiographic findings of osteomyelitis. 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 4th distal metacarpal shaft fracture, healing, unchanged. Electronically Signed   By: Julian Hy M.D.   On: 04/28/2022 17:56    EKG: personally reviewed my interpretation is unchanged from previous tracings, sinus tachycardia  ASSESSMENT & PLAN:  Assessment: Principal Problem:   Sepsis ([place])   Tyler Mueller is a 81 y.o. with pertinent PMH of COPD s/p lung transplant in 2004, ESRD on HD TThS, CAD s/p DESx3, osteomyelitis s/p amputation of right third ray who presented with fever and AMS and admit for sepsis with unclear source on hospital  day 1  Plan: #Sepsis with unknown source #Elevated lactic acid #Altered mental status Patient presented with SIRS criteria with temp >100.65F and HR>90. Patient was also hypertensive on admission and hypoxic on room air. CXR showed cardiomegaly with some vascular congestion and a small left pleural effusion. Right foot xray was negative for any signs of osteomyelitis. CT head negative for any acute intracranial abnormalities. CBC without leukocytosis. Lactic acid elevated to 2 initially and improved to 1.1 I have a low suspicion for meningitis in this patient, as he clinically appears to have already improved by the time of my evaluation and he does not have any nuchal rigidity. Suspect he may have some degree of encephalitis.  - Blood cultures in process - UDS pending - Continue empiric vanc (renally dosed) and cefepime - MRI brain without contrast pending  #ESRD on HD TThS Patient has been on HD for approximately 6 years. He did not receive a full HD session today due to uncontrolled BP. Electrolytes within normal limits at this time.  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 I do not suspect that this is the source of the patient's sepsis.   #Hypertension Patient is on amlodipine 5 mg and coreg 25 mg bid at home. Per last note from [facility], the patient is supposed to take amlodipine on non dialysis days and coreg bid on dialysis days, with the morning dose held. Upon presentation, BP elevated, with systolic in the 517O in the ED.  - Resumed amlodipine 5 mg at this time  #Hx of lung transplant in 2004 #Hx of Nocardia pulmonary infection Patient is on tacrolimus 0.5 mg bid, prednisone 5 mg daily, and prophylaxis with daily bactrim and azithromycin MWF. - Resumed tacrolimus, prednisone, bactrim, and azithro    Best  Practice:  Diet: Renal diet IVF: Fluids: none VTE: heparin injection 5,000 Units Start: 04/28/22 2200 Code: Full AB: Vanc, cefepime Status: Inpatient with expected length of stay greater than 2 midnights. Barriers to Discharge: Medical stability  Signature: Buddy Duty, DO Internal Medicine Resident, PGY-1 [facility]  Pager: [phone] 12:26 AM, 04/29/2022   Please contact the on call pager after 5 pm and on weekends at [phone].

## 2022-04-28 NOTE — ED Notes (Signed)
Patient transported to CT 

## 2022-04-28 NOTE — Sepsis Progress Note (Signed)
Sepsis protocol is being followed by [facility]. 

## 2022-04-28 NOTE — ED Provider Notes (Signed)
[place] EMERGENCY DEPARTMENT Provider Note   CSN: [id number] Arrival date & time: 04/28/22  1559  LEVEL 5 CAVEAT - ALTERED MENTAL STATUS   History  Chief Complaint  Patient presents with   Code Sepsis    Tyler Mueller is a 81 y.o. male.  HPI 81 year old male presents with fever, HTN, and AMS. Patient's history is initially from EMS provider.  Patient was doing well until today.  He was found to be tachycardic, febrile and hypertensive at dialysis.  Due to his hypertension they only did a partial session and told him to come back when his blood pressure was better.  His temperature was up to 102.  He has not received any medicines for this.  Patient was noted to be hypoxic with O2 sats in the 80s per EMS.  He is received about 1/2 L of fluid.  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  History is otherwise limited due to the patient's confusion.  He currently has no complaints.  Home Medications Prior to Admission medications   Medication Sig Start Date End Date Taking? Authorizing Provider  acetaminophen (TYLENOL) 325 MG tablet Take 650 mg by mouth every 6 (six) hours as needed for moderate pain.   Yes [provider]  albuterol (PROVENTIL) (2.5 MG/3ML) 0.083% nebulizer solution Take 2.5 mg by nebulization every 6 (six) hours as needed for wheezing or shortness of breath.   Yes [provider]  Alpha-D-Galactosidase (BEANO PO) Take 1 tablet by mouth daily as needed (gas/bloating).   Yes [provider]  amLODipine (NORVASC) 5 MG tablet Take 5 mg by mouth daily. Hold for systolic bp less than 426.   Yes [provider]  aspirin EC 81 MG tablet Take 81 mg by mouth daily.   Yes [provider]  azithromycin (ZITHROMAX) 250 MG tablet MAY ADMINISTER WITHOUT REGARD TO MEALS Patient taking differently: Take 250 mg by mouth every Monday, Wednesday,  and Friday. continuous 12/01/21  Yes Timothy Lasso, MD  beta carotene w/minerals (OCUVITE) tablet Take 1 tablet by mouth daily.   Yes [provider]  Calcium Carb-Cholecalciferol (CALCIUM 600 + D PO) Take 1 tablet by mouth daily with lunch.   Yes [provider]  carvedilol (COREG) 25 MG tablet Take 25 mg by mouth daily. Take 1 tablet (25 mg) by mouth in the evening on Tuesdays, Thursdays & Saturdays ( dialysis days)   Yes [provider]  famotidine (PEPCID) 20 MG tablet Take 20 mg by mouth in the morning.   Yes [provider]  Ferrous Gluconate 324 (37.5 Fe) MG TABS Take 324 mg by mouth every evening.   Yes [provider]  gabapentin (NEURONTIN) 300 MG capsule Take 300 mg by mouth at bedtime.   Yes [provider]  Insulin Aspart (NOVOLOG FLEXPEN Johnson) Inject 0-5 Units into the skin See admin instructions. 3 times daily per sliding scales < = 0 units 201-250=1 unit 251-300 = 2 units 301-350=  units 351-400= 4 units >400 5 units   Yes [provider]  lanthanum (FOSRENOL) 500 MG chewable tablet Chew 3,000 mg by mouth 2 (two) times daily.   Yes [provider]  midodrine (PROAMATINE) 5 MG tablet Take 5 mg by mouth See admin instructions. Takes 1 tablet by mouth only on dialysis days if systolic blood pressure less than 150.   Yes [provider]  Multiple Vitamin (MULTIVITAMIN WITH MINERALS) TABS tablet Take 1 tablet by mouth daily.   Yes [provider]  pravastatin (PRAVACHOL) 40 MG tablet Take 20 mg by mouth every evening.   Yes [provider]  predniSONE (DELTASONE) 5 MG tablet Take 5 mg by mouth daily.   Yes [provider]  senna (SENOKOT) 8.6 MG TABS tablet Take 1 tablet by mouth daily as needed for mild constipation.   Yes [provider]  sulfamethoxazole-trimethoprim (BACTRIM,SEPTRA) 400-80 MG per tablet Take 1 tablet by mouth every evening. continuous   Yes  [provider]  tacrolimus (PROGRAF) 0.5 MG capsule Take 0.5 mg by mouth 2 (two) times daily.   Yes [provider]  traMADol (ULTRAM) 50 MG tablet Take 50 mg by mouth 2 (two) times daily as needed for severe pain.   Yes [provider]  acyclovir (ZOVIRAX) 200 MG capsule Take 4 capsules (800 mg total) by mouth daily. Patient not taking: Reported on 04/28/2022 04/27/14   Jerrye Noble, MD  doxycycline (VIBRA-TABS) 100 MG tablet Take 1 tablet (100 mg total) by mouth 2 (two) times daily. Patient not taking: Reported on 04/28/2022 04/07/22   Suzan Slick, NP  lidocaine-prilocaine (EMLA) cream SMARTSIG:sparingly Topical 04/18/22   [provider]  oxyCODONE-acetaminophen (PERCOCET/ROXICET) 5-325 MG tablet Take 1 tablet by mouth every 6 (six) hours as needed for moderate pain. Patient not taking: Reported on 04/28/2022 02/26/22   Ulyses Amor, PA-C  potassium chloride SA (K-DUR,KLOR-CON) 20 MEQ tablet Take 1 tablet (20 mEq total) by mouth daily. Patient not taking: Reported on 2/95/2841 02/26/39   Delora Fuel, MD      Allergies    Lorazepam, Morphine and related, Nsaids, Penicillins, and Amphetamine-dextroamphetamine    Review of Systems   Review of Systems  Constitutional:  Positive for fever.  Psychiatric/Behavioral:  Positive for confusion.    Physical Exam Updated Vital Signs BP (!) 174/74   Pulse 100   Temp 100.3 F (37.9 C) (Oral)   Resp 11   Ht '5\' 8"'$  (1.727 m)   Wt 49.9 kg   SpO2 97%   BMI 16.73 kg/m  Physical Exam Vitals and nursing note reviewed.  Constitutional:      Appearance: He is well-developed.  HENT:     Head: Normocephalic and atraumatic.  Cardiovascular:     Rate and Rhythm: Regular rhythm. Tachycardia present.     Heart sounds: Normal heart sounds.  Pulmonary:     Effort: Pulmonary effort is normal.     Comments: Coarse breath sounds bilaterally Chest:    Abdominal:     Palpations: Abdomen is soft.     Tenderness:  There is no abdominal tenderness.  Musculoskeletal:       Feet:  Skin:    General: Skin is warm and dry.  Neurological:     Mental Status: He is alert. He is disoriented.    ED Results / Procedures / Treatments   Labs (all labs ordered are listed, but only abnormal results are displayed) Labs Reviewed  LACTIC ACID, PLASMA - Abnormal; Notable for the following components:      Result Value   Lactic Acid, Venous 2.0 (*)    All other components within normal limits  COMPREHENSIVE METABOLIC PANEL - Abnormal; Notable for the following components:   Glucose, Bld 100 (*)    Creatinine, Ser 3.63 (*)    Calcium 7.8 (*)    GFR, Estimated 16 (*)    All other  components within normal limits  CBC WITH DIFFERENTIAL/PLATELET - Abnormal; Notable for the following components:   MCV 101.1 (*)    RDW 15.9 (*)    Platelets 123 (*)    Abs Immature Granulocytes 0.24 (*)    All other components within normal limits  RESP PANEL BY RT-PCR (FLU A&B, COVID) ARPGX2  MRSA NEXT GEN BY PCR, NASAL  CULTURE, BLOOD (ROUTINE X 2)  CULTURE, BLOOD (ROUTINE X 2)  LACTIC ACID, PLASMA  PROTIME-INR  APTT  CBC  BASIC METABOLIC PANEL    EKG EKG Interpretation  Date/Time:  Thursday Apr 28 2022 16:24:29 EDT Ventricular Rate:  119 PR Interval:  147 QRS Duration: 90 QT Interval:  317 QTC Calculation: 446 R Axis:   -61 Text Interpretation: Sinus or ectopic atrial tachycardia Left anterior fascicular block Abnormal R-wave progression, early transition LVH with secondary repolarization abnormality Anterior ST elevation, probably due to LVH similar to Feb 27 2022 Confirmed by Sherwood Gambler [phone]) on 04/28/2022 4:26:05 PM  Radiology CT Head Wo Contrast  Result Date: 04/28/2022 CLINICAL DATA:  Mental status change of unknown cause. Recent diagnosis with sepsis. EXAM: CT HEAD WITHOUT CONTRAST TECHNIQUE: Contiguous axial images were obtained from the base of the skull through the vertex without intravenous  contrast. RADIATION DOSE REDUCTION: This exam was performed according to the departmental dose-optimization program which includes automated exposure control, adjustment of the mA and/or kV according to patient size and/or use of iterative reconstruction technique. COMPARISON:  11/26/2021 FINDINGS: Brain: Age related atrophy. Chronic small-vessel ischemic changes of the white matter. 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 No sign of acute infarction, mass lesion, hemorrhage, hydrocephalus or extra-axial collection. Vascular: There is atherosclerotic calcification of the major vessels at the base of the brain. Skull: Negative Sinuses/Orbits: Clear/normal Other: None IMPRESSION: No acute finding. Atrophy and chronic small-vessel ischemic changes. Electronically Signed   By: Nelson Chimes M.D.   On: 04/28/2022 18:53   DG Chest Port 1 View  Result Date: 04/28/2022 CLINICAL DATA:  Questionable sepsis. EXAM: PORTABLE CHEST 1 VIEW COMPARISON:  Chest x-ray 04/28/2022. FINDINGS: Patient is status post cardiac surgery as seen on the prior study. There is stable enlargement of the cardiomediastinal silhouette. There is increasing central pulmonary vascular congestion. There is a new small left pleural effusion. No pneumothorax or focal lung consolidation. No acute fractures are seen. IMPRESSION: 1. Cardiomegaly with increasing pulmonary vascular congestion and new small left pleural effusion. Electronically Signed   By: Ronney Asters M.D.   On: 04/28/2022 17:11   DG Foot Complete Right  Result Date: 04/28/2022 CLINICAL DATA:  Sepsis EXAM: RIGHT FOOT COMPLETE - 3+ VIEW COMPARISON:  Outside hospital [facility]) radiographs dated 04/20/2022 FINDINGS: 3rd digit amputation at the level of the mid metatarsal shaft. 4th distal metacarpal shaft fracture, healing, unchanged. Lucency along the forefoot (in the region of prior 3rd metatarsal resection) may correspond to the patient's known soft tissue wound. Wound is poorly  visualized but appears superficial on the lateral view. No definite cortical destruction to suggest osteomyelitis radiographically, noting limited evaluation. No fracture is seen. Extensive vascular calcifications. IMPRESSION: No radiographic findings of osteomyelitis. 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 4th distal metacarpal shaft fracture, healing, unchanged. Electronically Signed   By: Julian Hy M.D.   On: 04/28/2022 17:56    Procedures .Critical Care Performed by: Sherwood Gambler, MD Authorized by: Sherwood Gambler, MD   Critical care provider statement:    Critical care time (minutes):  35  Critical care time was exclusive of:  Separately billable procedures and treating other patients   Critical care was necessary to treat or prevent imminent or life-threatening deterioration of the following conditions:  Sepsis   Critical care was time spent personally by me on the following activities:  Development of treatment plan with patient or surrogate, discussions with consultants, evaluation of patient's response to treatment, examination of patient, ordering and review of laboratory studies, ordering and review of radiographic studies, ordering and performing treatments and interventions, pulse oximetry, re-evaluation of patient's condition and review of old charts    Medications Ordered in ED Medications  vancomycin variable dose per unstable renal function (pharmacist dosing) (has no administration in time range)  ceFEPIme (MAXIPIME) 1 g in sodium chloride 0.9 % 100 mL IVPB (has no administration in time range)  heparin injection 5,000 Units (has no administration in time range)  acetaminophen (TYLENOL) tablet 650 mg (has no administration in time range)    Or  acetaminophen (TYLENOL) suppository 650 mg (has no administration in time range)  senna-docusate (Senokot-S) tablet 1 tablet (has no administration in time range)  ceFEPIme (MAXIPIME) 2 g in sodium  chloride 0.9 % 100 mL IVPB (0 g Intravenous Stopped 04/28/22 1745)  metroNIDAZOLE (FLAGYL) IVPB 500 mg (0 mg Intravenous Stopped 04/28/22 2008)  vancomycin (VANCOCIN) IVPB 1000 mg/200 mL premix (0 mg Intravenous Stopped 04/28/22 2008)  acetaminophen (TYLENOL) tablet 650 mg (650 mg Oral Given 04/28/22 1718)    ED Course/ Medical Decision Making/ A&P                           Medical Decision Making Amount and/or Complexity of Data Reviewed Independent Historian: spouse and EMS External Data Reviewed: notes. Labs: ordered. Radiology: ordered and independent interpretation performed. ECG/medicine tests: ordered and independent interpretation performed.  Risk OTC drugs. Prescription drug management. Decision regarding hospitalization.   Patient presents with fever, at this point is unclear where the source is.  Chest x-ray image viewed by myself and there does appear to be congestion versus pneumonia.  Given this along with some mild hypoxia in the low 90s as well as some coughing I think it is reasonable to treat broadly with IV antibiotics.  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  At this point, he has been admitted to [facility] multiple times without more just from a family preference standpoint.  When asked where they went to be admitted today she states is fine to stay at [facility].  I do not think there is an emergent reason to transfer to [facility].  Otherwise, he was altered on arrival but now is much more alert think the fever was playing a large role in this.  No meningismus on exam.  At this point, cultures have been obtained and has been given broad antibiotics.  Blood pressure is coming down.  Discussed with internal medicine teaching service for admission.        Final Clinical Impression(s) / ED Diagnoses Final diagnoses:  Sepsis, due to unspecified organism, unspecified whether acute organ dysfunction present [facility])     Rx / DC Orders ED Discharge Orders     None         Sherwood Gambler, MD 04/28/22 [phone]

## 2022-04-28 NOTE — ED Notes (Signed)
[place]

## 2022-04-28 NOTE — H&P (Incomplete)
Date: 04/28/2022               Patient Name:  Tyler Mueller MRN: [id number]  DOB: Oct 09, 1941 Age / Sex: 81 y.o., male   PCP: Mikey College, MD         Medical Service: Internal Medicine Teaching Service         Attending Physician: Dr. Sherwood Gambler, MD    First Contact: Lajean Manes, MD Pager: [phone]  Second Contact: Linwood Dibbles, MD Pager: PA [phone]       After Hours (After 5p/  First Contact Pager: [phone]  weekends / holidays): Second Contact Pager: [phone]   SUBJECTIVE  Chief Complaint: altered mental status  History of Present Illness: Tyler Mueller is a 81 y.o. 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 The patient was at HD today when he was noted to be tachycardic, febrile to 102F, and hypertensive. The patient did not complete a full HD session today due to his elevated BP. EMS was called and the patient was also noted to be hypoxic in the 80s.   When asking the patient what brought him in, he states that he came to get his foot checked out because "it doesn't feel right". He denies any drainage or pain around the surgical site. Patient had his right third ray amputated approximately 3 weeks ago with Dr. Sharol Given. He believes he may have had a fever and is currently endorsing chills. He denies any dizziness, lightheadedness, chest pain, shortness of breath, abdominal pain, nausea/vomiting, diarrhea, hematochezia; lower extremity swelling. The patient states that he feels "like crap" and has felt poorly for the last few days.   In the ED, the patient was hypertensive with SBP initially in the 180s, febrile to 100.4, and tachycardic with rates in the 100s. CBC was without leukocytosis or anemia. CMP with no liver function abnormalities and electrolytes within normal  Medications: No current  facility-administered medications on file prior to encounter.   Current Outpatient Medications on File Prior to Encounter  Medication Sig Dispense Refill  . acetaminophen (TYLENOL) 500 MG tablet Take 500 mg by mouth every 6 (six) hours as needed for moderate pain.    Marland Kitchen acyclovir (ZOVIRAX) 200 MG capsule Take 4 capsules (800 mg total) by mouth daily. 28 capsule 0  . Alpha-D-Galactosidase (BEANO PO) Take 1 tablet by mouth daily as needed (gas/bloating).    Marland Kitchen amLODipine (NORVASC) 5 MG tablet Take 5 mg by mouth daily. Hold for systolic bp less than 709.    Marland Kitchen aspirin EC 81 MG tablet Take 81 mg by mouth daily.    Marland Kitchen azithromycin (ZITHROMAX) 250 MG tablet MAY ADMINISTER WITHOUT REGARD TO MEALS (Patient taking differently: Take 250 mg by mouth every Monday, Wednesday, and Friday. continuous) 6 each 0  . beta carotene w/minerals (OCUVITE) tablet Take 1 tablet by mouth daily.    . calcium acetate (PHOSLO) 667 MG capsule Take 2,001 mg by mouth 2 (two) times daily with a meal.    . Calcium-Magnesium-Zinc (CAL-MAG-ZINC PO) Take 1 tablet by mouth in the morning.    . carvedilol (COREG) 25 MG tablet Take 25 mg by mouth daily. Take 1 tablet (25 mg) by mouth in the evening on Tuesdays, Thursdays & Saturdays ( dialysis days)    . doxycycline (VIBRA-TABS) 100 MG tablet  Take 1 tablet (100 mg total) by mouth 2 (two) times daily. 60 tablet 0  . famotidine (PEPCID) 20 MG tablet Take 20 mg by mouth in the morning.    . ferrous gluconate (FERGON) 225 (27 FE) MG tablet Take 240 mg by mouth every evening.    . fluticasone-salmeterol (ADVAIR) 250-50 MCG/ACT AEPB Inhale 1 puff into the lungs in the morning and at bedtime.    . gabapentin (NEURONTIN) 300 MG capsule Take 300 mg by mouth at bedtime.    Marland Kitchen lanthanum (FOSRENOL) 500 MG chewable tablet Chew 1,500 mg by mouth 2 (two) times daily.    . midodrine (PROAMATINE) 5 MG tablet Take 5 mg by mouth See admin instructions. Takes 1 tablet by mouth only on dialysis days if systolic  blood pressure less than 150.    . Multiple Vitamin (MULTIVITAMIN WITH MINERALS) TABS tablet Take 1 tablet by mouth daily.    Marland Kitchen oxyCODONE-acetaminophen (PERCOCET/ROXICET) 5-325 MG tablet Take 1 tablet by mouth every 6 (six) hours as needed for moderate pain. 30 tablet 0  . potassium chloride SA (K-DUR,KLOR-CON) 20 MEQ tablet Take 1 tablet (20 mEq total) by mouth daily. 5 tablet 0  . pravastatin (PRAVACHOL) 40 MG tablet Take 20 mg by mouth every evening.    . predniSONE (DELTASONE) 5 MG tablet Take 5 mg by mouth daily.    Marland Kitchen sulfamethoxazole-trimethoprim (BACTRIM,SEPTRA) 400-80 MG per tablet Take 1 tablet by mouth every evening. continuous    . tacrolimus (PROGRAF) 0.5 MG capsule Take 0.5 mg by mouth 2 (two) times daily.    . traMADol (ULTRAM) 50 MG tablet Take 50 mg by mouth 2 (two) times daily as needed for severe pain.    Marland Kitchen UNKNOWN TO PATIENT Inject 4-6 Units into the skin daily as needed (If blood sugar over 200). Sliding scale      Past Medical History:  Past Medical History:  Diagnosis Date  . Diabetes mellitus without complication ([place])   . Diverticulitis   . ESRD on hemodialysis ([facility])    RENAL INSUFFICIENCY  . History of lung transplant ([place]) 04/21/2014  . Hypertension   . Lung transplanted ([place]) 12/05/2002   BILATERAL   . Muscle spasm of back 04/21/2014  . Skin cancer of face     Social:  Lives - with wife at home; helps with medication administration and cooking. Uses cane/walker for ambulation PCP is Dr Doy Mince at the [facility] Substance use - Prior tobacco use of 1/2ppd for 30 years; quit 20 years ago. Denies alcohol use or illicit substance use  Family History: Hypertension Diabetes - sister  No family history of cancer   Allergies: Allergies as of 04/28/2022 - Review Complete 04/28/2022  Allergen Reaction Noted  . Lorazepam Other (See Comments) 06/11/2017  . Morphine and related Nausea And Vomiting and Other (See Comments) 04/20/2014  . Nsaids Other (See Comments)  02/04/2022  . Penicillins Nausea And Vomiting and Other (See Comments)   . Amphetamine-dextroamphetamine Anxiety and Other (See Comments) 05/20/2019    Review of Systems: A complete ROS was negative except as per HPI.   OBJECTIVE:  Physical Exam: Blood pressure (!) 152/63, pulse 95, temperature 100.3 F (37.9 C), temperature source Oral, resp. rate (!) 29, height '5\' 8"'$  (1.727 m), weight 49.9 kg, SpO2 96 %. Physical Exam  Pertinent Labs: CBC    Component Value Date/Time   WBC 5.6 04/28/2022 1638   RBC 4.57 04/28/2022 1638   HGB 13.9 04/28/2022 1638   HCT 46.2 04/28/2022 1638  PLT 123 (L) 04/28/2022 1638   MCV 101.1 (H) 04/28/2022 1638   MCH 30.4 04/28/2022 1638   MCHC 30.1 04/28/2022 1638   RDW 15.9 (H) 04/28/2022 1638   LYMPHSABS 0.9 04/28/2022 1638   MONOABS 0.5 04/28/2022 1638   EOSABS 0.1 04/28/2022 1638   BASOSABS 0.1 04/28/2022 1638     CMP     Component Value Date/Time   NA 140 04/28/2022 1638   K 5.1 04/28/2022 1638   CL 98 04/28/2022 1638   CO2 29 04/28/2022 1638   GLUCOSE 100 (H) 04/28/2022 1638   GLUCOSE 104 (H) 10/31/2006 0755   BUN 16 04/28/2022 1638   CREATININE 3.63 (H) 04/28/2022 1638   CALCIUM 7.8 (L) 04/28/2022 1638   CALCIUM 8.5 05/09/2011 1339   PROT 6.9 04/28/2022 1638   ALBUMIN 3.5 04/28/2022 1638   AST 39 04/28/2022 1638   ALT 21 04/28/2022 1638   ALKPHOS 83 04/28/2022 1638   BILITOT 0.8 04/28/2022 1638   GFRNONAA 16 (L) 04/28/2022 1638   GFRAA 6 (L) 07/16/2015 2330    Pertinent Imaging: CT Head Wo Contrast  Result Date: 04/28/2022 CLINICAL DATA:  Mental status change of unknown cause. Recent diagnosis with sepsis. EXAM: CT HEAD WITHOUT CONTRAST TECHNIQUE: Contiguous axial images were obtained from the base of the skull through the vertex without intravenous contrast. RADIATION DOSE REDUCTION: This exam was performed according to the departmental dose-optimization program which includes automated exposure control, adjustment of the mA  and/or kV according to patient size and/or use of iterative reconstruction technique. COMPARISON:  11/26/2021 FINDINGS: Brain: Age related atrophy. Chronic small-vessel ischemic changes of the white matter. 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 No sign of acute infarction, mass lesion, hemorrhage, hydrocephalus or extra-axial collection. Vascular: There is atherosclerotic calcification of the major vessels at the base of the brain. Skull: Negative Sinuses/Orbits: Clear/normal Other: None IMPRESSION: No acute finding. Atrophy and chronic small-vessel ischemic changes. Electronically Signed   By: Nelson Chimes M.D.   On: 04/28/2022 18:53   DG Chest Port 1 View  Result Date: 04/28/2022 CLINICAL DATA:  Questionable sepsis. EXAM: PORTABLE CHEST 1 VIEW COMPARISON:  Chest x-ray 04/28/2022. FINDINGS: Patient is status post cardiac surgery as seen on the prior study. There is stable enlargement of the cardiomediastinal silhouette. There is increasing central pulmonary vascular congestion. There is a new small left pleural effusion. No pneumothorax or focal lung consolidation. No acute fractures are seen. IMPRESSION: 1. Cardiomegaly with increasing pulmonary vascular congestion and new small left pleural effusion. Electronically Signed   By: Ronney Asters M.D.   On: 04/28/2022 17:11   DG Foot Complete Right  Result Date: 04/28/2022 CLINICAL DATA:  Sepsis EXAM: RIGHT FOOT COMPLETE - 3+ VIEW COMPARISON:  Outside hospital [facility]) radiographs dated 04/20/2022 FINDINGS: 3rd digit amputation at the level of the mid metatarsal shaft. 4th distal metacarpal shaft fracture, healing, unchanged. Lucency along the forefoot (in the region of prior 3rd metatarsal resection) may correspond to the patient's known soft tissue wound. Wound is poorly visualized but appears superficial on the lateral view. No definite cortical destruction to suggest osteomyelitis radiographically, noting limited evaluation. No fracture is seen. Extensive  vascular calcifications. IMPRESSION: No radiographic findings of osteomyelitis. 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 4th distal metacarpal shaft fracture, healing, unchanged. Electronically Signed   By: Julian Hy M.D.   On: 04/28/2022 17:56    EKG: personally reviewed my interpretation is {ekg findings:315101}  ASSESSMENT & PLAN:  Assessment: Active Problems:   *  No active hospital problems. *   CHEVEYO VIRGINIA is a 81 y.o. with pertinent PMH of *** who presented with *** and admit for *** on hospital day 0  Plan: #*** ***  #*** ***  #*** ***  #*** ***  #*** ***  #*** ***  #*** ***  Best Practice: Diet: {CHL DISCHARGE DIET:21201} IVF: Fluids: {Meds; iv fluids:31617}, Rate: {NAMES:3044014::"None","*** cc/hr x *** hrs","*** cc bolus"} VTE:  Code: {NAMES:3044014::"Full","DNR","DNI","DNR/DNI","Comfort Care","Unknown"} AB: *** Status: {STATUS:3044014::"Observation with expected length of stay less than 2 midnights.","Inpatient with expected length of stay greater than 2 midnights."} Anticipated Discharge Location: {NAMES:3044014::"Home","SNF","CIR","***"} Barriers to Discharge: {BARRIERS TO MRAJHHIDU:37357}  Signature: Harvie Heck, MD Internal Medicine Resident, PGY-3 [facility]  Pager: [phone] 8:39 PM, 04/28/2022   Please contact the on call pager after 5 pm and on weekends at [phone].

## 2022-04-29 ENCOUNTER — Inpatient Hospital Stay (HOSPITAL_COMMUNITY): Payer: Medicare Other

## 2022-04-29 DIAGNOSIS — I120 Hypertensive chronic kidney disease with stage 5 chronic kidney disease or end stage renal disease: Secondary | ICD-10-CM

## 2022-04-29 DIAGNOSIS — Z87891 Personal history of nicotine dependence: Secondary | ICD-10-CM

## 2022-04-29 LAB — ACETAMINOPHEN LEVEL: Acetaminophen (Tylenol), Serum: <10 ug/mL — ABNORMAL LOW (ref 10–30)

## 2022-04-29 LAB — CBC
HCT: 43.2 % (ref 39.0–52.0)
Hemoglobin: 13.2 g/dL (ref 13.0–17.0)
MCH: 30 pg (ref 26.0–34.0)
MCHC: 30.6 g/dL (ref 30.0–36.0)
MCV: 98.2 fL (ref 80.0–100.0)
Platelets: 121 10*3/uL — ABNORMAL LOW (ref 150–400)
RBC: 4.4 MIL/uL (ref 4.22–5.81)
RDW: 15.9 % — ABNORMAL HIGH (ref 11.5–15.5)
WBC: 6.5 10*3/uL (ref 4.0–10.5)
nRBC: 0 % (ref 0.0–0.2)

## 2022-04-29 LAB — BLOOD GAS, ARTERIAL
Acid-Base Excess: 4.2 mmol/L — ABNORMAL HIGH (ref 0.0–2.0)
Bicarbonate: 27.3 mmol/L (ref 20.0–28.0)
Drawn by: 65022
O2 Saturation: 78.8 %
Patient temperature: 37.6
pCO2 arterial: 36 mmHg (ref 32–48)
pH, Arterial: 7.49 — ABNORMAL HIGH (ref 7.35–7.45)
pO2, Arterial: 47 mmHg — ABNORMAL LOW (ref 83–108)

## 2022-04-29 LAB — BASIC METABOLIC PANEL
Anion gap: 16 — ABNORMAL HIGH (ref 5–15)
BUN: 28 mg/dL — ABNORMAL HIGH (ref 8–23)
CO2: 23 mmol/L (ref 22–32)
Calcium: 7.5 mg/dL — ABNORMAL LOW (ref 8.9–10.3)
Chloride: 98 mmol/L (ref 98–111)
Creatinine, Ser: 4.67 mg/dL — ABNORMAL HIGH (ref 0.61–1.24)
GFR, Estimated: 12 mL/min — ABNORMAL LOW (ref >60)
Glucose, Bld: 80 mg/dL (ref 70–99)
Potassium: 5.6 mmol/L — ABNORMAL HIGH (ref 3.5–5.1)
Sodium: 137 mmol/L (ref 135–145)

## 2022-04-29 LAB — VITAMIN B12: Vitamin B-12: 207 pg/mL (ref 180–914)

## 2022-04-29 LAB — SALICYLATE LEVEL: Salicylate Lvl: <7 mg/dL — ABNORMAL LOW (ref 7.0–30.0)

## 2022-04-29 LAB — PROCALCITONIN: Procalcitonin: 3.44 ng/mL

## 2022-04-29 LAB — TSH: TSH: 1.949 u[IU]/mL (ref 0.350–4.500)

## 2022-04-29 MED ORDER — LIDOCAINE-PRILOCAINE 2.5-2.5 % EX CREA: 1.0000 "application " | TOPICAL_CREAM | CUTANEOUS | Status: DC | PRN

## 2022-04-29 MED ORDER — AZITHROMYCIN 500 MG PO TABS
250.0000 mg | ORAL_TABLET | ORAL | Status: DC
  Administered 2022-04-29: 250 mg via ORAL
  Filled 2022-04-29 (×2): qty 1

## 2022-04-29 MED ORDER — CHLORHEXIDINE GLUCONATE CLOTH 2 % EX PADS
6.0000 | MEDICATED_PAD | Freq: Every day | CUTANEOUS | Status: DC
  Administered 2022-04-30 – 2022-05-01 (×2): 6 via TOPICAL

## 2022-04-29 MED ORDER — SODIUM CHLORIDE 0.9 % IV SOLN: 100.0000 mL | INTRAVENOUS | Status: DC | PRN

## 2022-04-29 MED ORDER — SULFAMETHOXAZOLE-TRIMETHOPRIM 400-80 MG PO TABS
1.0000 | ORAL_TABLET | Freq: Every evening | ORAL | Status: DC
  Filled 2022-04-29 (×2): qty 1

## 2022-04-29 MED ORDER — VANCOMYCIN HCL 500 MG/100ML IV SOLN
500.0000 mg | Freq: Once | INTRAVENOUS | Status: AC
  Administered 2022-04-29: 500 mg via INTRAVENOUS
  Filled 2022-04-29 (×2): qty 100

## 2022-04-29 MED ORDER — TACROLIMUS 0.5 MG PO CAPS
0.5000 mg | ORAL_CAPSULE | Freq: Two times a day (BID) | ORAL | Status: DC
  Administered 2022-04-29 (×2): 0.5 mg via ORAL
  Filled 2022-04-29 (×6): qty 1

## 2022-04-29 MED ORDER — PREDNISONE 5 MG PO TABS
5.0000 mg | ORAL_TABLET | Freq: Every day | ORAL | Status: DC
  Administered 2022-04-29: 5 mg via ORAL
  Filled 2022-04-29 (×2): qty 1

## 2022-04-29 MED ORDER — AMLODIPINE BESYLATE 5 MG PO TABS
5.0000 mg | ORAL_TABLET | Freq: Every day | ORAL | Status: DC
  Administered 2022-04-29: 5 mg via ORAL
  Filled 2022-04-29 (×2): qty 1

## 2022-04-29 MED ORDER — LIDOCAINE HCL (PF) 1 % IJ SOLN: 5.0000 mL | INTRAMUSCULAR | Status: DC | PRN

## 2022-04-29 MED ORDER — SODIUM CHLORIDE 0.9 % IV BOLUS
500.0000 mL | Freq: Once | INTRAVENOUS | Status: AC
  Administered 2022-04-29: 500 mL via INTRAVENOUS

## 2022-04-29 MED ORDER — TRAMADOL HCL 50 MG PO TABS: 50.0000 mg | ORAL_TABLET | Freq: Two times a day (BID) | ORAL | Status: DC | PRN

## 2022-04-29 MED ORDER — SODIUM ZIRCONIUM CYCLOSILICATE 10 G PO PACK
10.0000 g | PACK | Freq: Two times a day (BID) | ORAL | Status: DC
  Administered 2022-04-29: 10 g via ORAL
  Filled 2022-04-29: qty 1

## 2022-04-29 MED ORDER — PENTAFLUOROPROP-TETRAFLUOROETH EX AERO: 1.0000 "application " | INHALATION_SPRAY | CUTANEOUS | Status: DC | PRN

## 2022-04-29 NOTE — Procedures (Signed)
Patient was seen on dialysis and the procedure was supervised.  BFR 400  Via AVF BP is  117/38 bur HR over 100.   Patient moaning-  apparently not his baseline -  will remove volume that we can   Louis Meckel 04/29/2022

## 2022-04-29 NOTE — Progress Notes (Addendum)
1 

## 2022-04-29 NOTE — Consult Note (Addendum)
[facility] Renal Consultation Note    Indication for Consultation:  Management of ESRD/hemodialysis; anemia, hypertension/volume and secondary hyperparathyroidism PCP:  HPI: Tyler Mueller is a 81 y.o. male with ESRD D/T CNI toxicity who has hemodialysis T,Th,S at [facility]. PMH: Bilateral lung transplant 2014 [place], CMV, PNA DVT, osteomyelitis S/P amputation R third toe, DMT2, DMT2, SHPT. Last HD 04/28/2022 completed 3 hrs 16 min of 3:45 hr treatment. Last been leaving above OP EDW, post wt yesterday 54.4 kg.   He presented to HD from HD center D/T fever, tachycardia, AMS. Labs unremarkable for ESRD patient. Lactate 2.0 on arrival. CT of head negative. CXR with increased vascular congestion, new small L pleural effusion. Xray of R foot without radiological findings of osteomyelitis. He has been admitted for AMS, sepsis. BC pending. He has been started started on broad spectrum ABX per sepsis protocol.   Seen in room. He doesn't recognize me and he knows me well. Minimally responsive. He appears acutely ill with HR 110, mild tachypnea. He has basilar crackles, coarse breath sounds upper airway. He left HD center 3.4 kg above EDW. Will order HD for today off schedule.  I see him in dialysis-  had HR increase to the 150's-  sustained-  gave a little fluid back-  200 cc and decreased UF goal- pt is just moaning-   wont verbalize if anything Is wrong-  right now refusing to wearO2 but sat is OK   Past Medical History:  Diagnosis Date   Diabetes mellitus without complication ([place])    Diverticulitis    ESRD on hemodialysis ([place])    RENAL INSUFFICIENCY   History of lung transplant ([place]) 04/21/2014   Hypertension    Lung transplanted ([place]) 12/05/2002   BILATERAL    Muscle spasm of back 04/21/2014   Skin cancer of face    Past Surgical History:  Procedure Laterality Date   ABDOMINAL AORTOGRAM W/LOWER EXTREMITY Right 02/22/2022   Procedure: ABDOMINAL AORTOGRAM  W/LOWER EXTREMITY;  Surgeon: Serafina Mitchell, MD;  Location: [facility];  Service: Cardiovascular;  Laterality: Right;   AMPUTATION TOE Right 02/23/2022   Procedure: AMPUTATION THIRD RIGHT TOE;  Surgeon: Serafina Mitchell, MD;  Location: [facility] OR;  Service: Vascular;  Laterality: Right;   APPENDECTOMY     APPLICATION OF WOUND VAC Right 02/23/2022   Procedure: APPLICATION OF WOUND VAC;  Surgeon: Serafina Mitchell, MD;  Location: [facility] OR;  Service: Vascular;  Laterality: Right;   CATARACT EXTRACTION     ENDARTERECTOMY FEMORAL Right 02/23/2022   Procedure: RIGHT FEMORAL ENDARTERECTOMY;  Surgeon: Serafina Mitchell, MD;  Location: [facility] OR;  Service: Vascular;  Laterality: Right;   FEMORAL-POPLITEAL BYPASS GRAFT Right 02/23/2022   Procedure: RIGHT FEMORAL-POPLITEAL ARTERY BYPASS;  Surgeon: Serafina Mitchell, MD;  Location: [facility] OR;  Service: Vascular;  Laterality: Right;   LUNG TRANSPLANT, DOUBLE  2004   PERIPHERAL VASCULAR BALLOON ANGIOPLASTY Right 02/22/2022   Procedure: PERIPHERAL VASCULAR BALLOON ANGIOPLASTY;  Surgeon: Serafina Mitchell, MD;  Location: [facility];  Service: Cardiovascular;  Laterality: Right;  SFA unable to cross lesion   History reviewed. No pertinent family history. Social History:  reports that he quit smoking about 19 years ago. His smoking use included cigarettes. He has never used smokeless tobacco. He reports that he does not drink alcohol and does not use drugs. Allergies  Allergen Reactions   Lorazepam Other (See Comments)    delirium    Morphine And  Related Nausea And Vomiting and Other (See Comments)    Hallucinations    Nsaids Other (See Comments)     Kidney disorder   Penicillins Nausea And Vomiting and Other (See Comments)    Hallucinations    Amphetamine-Dextroamphetamine Anxiety and Other (See Comments)    confusion    Prior to Admission medications   Medication Sig Start Date End Date Taking? Authorizing Provider  acetaminophen (TYLENOL) 325 MG tablet  Take 650 mg by mouth every 6 (six) hours as needed for moderate pain.   Yes [provider]  albuterol (PROVENTIL) (2.5 MG/3ML) 0.083% nebulizer solution Take 2.5 mg by nebulization every 6 (six) hours as needed for wheezing or shortness of breath.   Yes [provider]  Alpha-D-Galactosidase (BEANO PO) Take 1 tablet by mouth daily as needed (gas/bloating).   Yes [provider]  amLODipine (NORVASC) 5 MG tablet Take 5 mg by mouth daily. Hold for systolic bp less than 240.   Yes [provider]  aspirin EC 81 MG tablet Take 81 mg by mouth daily.   Yes [provider]  azithromycin (ZITHROMAX) 250 MG tablet MAY ADMINISTER WITHOUT REGARD TO MEALS Patient taking differently: Take 250 mg by mouth every Monday, Wednesday, and Friday. continuous 12/01/21  Yes Timothy Lasso, MD  beta carotene w/minerals (OCUVITE) tablet Take 1 tablet by mouth daily.   Yes [provider]  Calcium Carb-Cholecalciferol (CALCIUM 600 + D PO) Take 1 tablet by mouth daily with lunch.   Yes [provider]  carvedilol (COREG) 25 MG tablet Take 25 mg by mouth daily. Take 1 tablet (25 mg) by mouth in the evening on Tuesdays, Thursdays & Saturdays ( dialysis days)   Yes [provider]  famotidine (PEPCID) 20 MG tablet Take 20 mg by mouth in the morning.   Yes [provider]  Ferrous Gluconate 324 (37.5 Fe) MG TABS Take 324 mg by mouth every evening.   Yes [provider]  gabapentin (NEURONTIN) 300 MG capsule Take 300 mg by mouth at bedtime.   Yes [provider]  Insulin Aspart (NOVOLOG FLEXPEN [place]) Inject 0-5 Units into the skin See admin instructions. 3 times daily per sliding scales < = 0 units 201-250=1 unit 251-300 = 2 units 301-350=  units 351-400= 4 units >400 5 units   Yes [provider]  lanthanum (FOSRENOL) 500 MG chewable tablet Chew 3,000 mg by mouth 2 (two) times daily.   Yes [provider]   midodrine (PROAMATINE) 5 MG tablet Take 5 mg by mouth See admin instructions. Takes 1 tablet by mouth only on dialysis days if systolic blood pressure less than 150.   Yes [provider]  Multiple Vitamin (MULTIVITAMIN WITH MINERALS) TABS tablet Take 1 tablet by mouth daily.   Yes [provider]  pravastatin (PRAVACHOL) 40 MG tablet Take 20 mg by mouth every evening.   Yes [provider]  predniSONE (DELTASONE) 5 MG tablet Take 5 mg by mouth daily.   Yes [provider]  senna (SENOKOT) 8.6 MG TABS tablet Take 1 tablet by mouth daily as needed for mild constipation.   Yes [provider]  sulfamethoxazole-trimethoprim (BACTRIM,SEPTRA) 400-80 MG per tablet Take 1 tablet by mouth every evening. continuous   Yes [provider]  tacrolimus (PROGRAF) 0.5 MG capsule Take 0.5 mg by mouth 2 (two) times daily.   Yes [provider]  traMADol (ULTRAM) 50 MG tablet Take 50 mg by mouth 2 (two) times  daily as needed for severe pain.   Yes [provider]  acyclovir (ZOVIRAX) 200 MG capsule Take 4 capsules (800 mg total) by mouth daily. Patient not taking: Reported on 04/28/2022 04/27/14   Jerrye Noble, MD  doxycycline (VIBRA-TABS) 100 MG tablet Take 1 tablet (100 mg total) by mouth 2 (two) times daily. Patient not taking: Reported on 04/28/2022 04/07/22   Suzan Slick, NP  lidocaine-prilocaine (EMLA) cream SMARTSIG:sparingly Topical 04/18/22   [provider]  oxyCODONE-acetaminophen (PERCOCET/ROXICET) 5-325 MG tablet Take 1 tablet by mouth every 6 (six) hours as needed for moderate pain. Patient not taking: Reported on 04/28/2022 02/26/22   Ulyses Amor, PA-C  potassium chloride SA (K-DUR,KLOR-CON) 20 MEQ tablet Take 1 tablet (20 mEq total) by mouth daily. Patient not taking: Reported on 0/93/2671 2/45/80   Delora Fuel, MD   Current Facility-Administered Medications  Medication Dose Route Frequency Provider Last Rate Last  Admin   acetaminophen (TYLENOL) tablet 650 mg  650 mg Oral Q6H PRN Atway, Rayann N, DO       Or   acetaminophen (TYLENOL) suppository 650 mg  650 mg Rectal Q6H PRN Atway, Rayann N, DO       amLODipine (NORVASC) tablet 5 mg  5 mg Oral Daily Atway, Rayann N, DO       azithromycin (ZITHROMAX) tablet 250 mg  250 mg Oral Q M,W,F Atway, Rayann N, DO       ceFEPIme (MAXIPIME) 1 g in sodium chloride 0.9 % 100 mL IVPB  1 g Intravenous Q24H Sherwood Gambler, MD       heparin injection 5,000 Units  5,000 Units Subcutaneous Q8H Atway, Rayann N, DO   5,000 Units at 04/29/22 0516   predniSONE (DELTASONE) tablet 5 mg  5 mg Oral Daily Atway, Rayann N, DO       senna-docusate (Senokot-S) tablet 1 tablet  1 tablet Oral QHS PRN Atway, Rayann N, DO       sodium zirconium cyclosilicate (LOKELMA) packet 10 g  10 g Oral BID Valentina Gu, NP       sulfamethoxazole-trimethoprim (BACTRIM) 400-80 MG per tablet 1 tablet  1 tablet Oral QPM Atway, Rayann N, DO       tacrolimus (PROGRAF) capsule 0.5 mg  0.5 mg Oral BID Atway, Rayann N, DO   0.5 mg at 04/29/22 0209   traMADol (ULTRAM) tablet 50 mg  50 mg Oral BID PRN Atway, Rayann N, DO       vancomycin variable dose per unstable renal function (pharmacist dosing)   Does not apply See admin instructions Sherwood Gambler, MD       Labs: Basic Metabolic Panel: Recent Labs  Lab 04/28/22 1638 04/29/22 0211  NA 140 137  K 5.1 5.6*  CL 98 98  CO2 29 23  GLUCOSE 100* 80  BUN 16 28*  CREATININE 3.63* 4.67*  CALCIUM 7.8* 7.5*   Liver Function Tests: Recent Labs  Lab 04/28/22 1638  AST 39  ALT 21  ALKPHOS 83  BILITOT 0.8  PROT 6.9  ALBUMIN 3.5   No results for input(s): LIPASE, AMYLASE in the last 168 hours. No results for input(s): AMMONIA in the last 168 hours. CBC: Recent Labs  Lab 04/28/22 1638 04/29/22 0211  WBC 5.6 6.5  NEUTROABS 3.8  --   HGB 13.9 13.2  HCT 46.2 43.2  MCV 101.1* 98.2  PLT 123* 121*   Cardiac Enzymes: No results for  input(s): CKTOTAL, CKMB, CKMBINDEX, TROPONINI in the last  168 hours. CBG: No results for input(s): GLUCAP in the last 168 hours. Iron Studies: No results for input(s): IRON, TIBC, TRANSFERRIN, FERRITIN in the last 72 hours. Studies/Results: CT Head Wo Contrast  Result Date: 04/28/2022 CLINICAL DATA:  Mental status change of unknown cause. Recent diagnosis with sepsis. EXAM: CT HEAD WITHOUT CONTRAST TECHNIQUE: Contiguous axial images were obtained from the base of the skull through the vertex without intravenous contrast. RADIATION DOSE REDUCTION: This exam was performed according to the departmental dose-optimization program which includes automated exposure control, adjustment of the mA and/or kV according to patient size and/or use of iterative reconstruction technique. COMPARISON:  11/26/2021 FINDINGS: Brain: Age related atrophy. Chronic small-vessel ischemic changes of the white matter. 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 No sign of acute infarction, mass lesion, hemorrhage, hydrocephalus or extra-axial collection. Vascular: There is atherosclerotic calcification of the major vessels at the base of the brain. Skull: Negative Sinuses/Orbits: Clear/normal Other: None IMPRESSION: No acute finding. Atrophy and chronic small-vessel ischemic changes. Electronically Signed   By: Nelson Chimes M.D.   On: 04/28/2022 18:53   MR BRAIN WO CONTRAST  Result Date: 04/29/2022 CLINICAL DATA:  Provided history: Mental status change, unknown cause. Fever, altered mental status. EXAM: MRI HEAD WITHOUT CONTRAST TECHNIQUE: Multiplanar, multiecho pulse sequences of the brain and surrounding structures were obtained without intravenous contrast. COMPARISON:  Prior head CT examinations 04/28/2022 and earlier. Brain MRI 06/10/2021. FINDINGS: The examination is intermittently motion degraded, limiting evaluation. Most notably, there is severe motion degradation of the sagittal T1 weighted sequence, moderate motion degradation of  the axial diffusion-weighted sequence, mild-to-moderate motion degradation of the coronal diffusion-weighted sequence, moderate motion degradation of the axial T2 FLAIR sequence, and severe motion degradation of the axial SWI sequence. Due to altered mental status and excessive patient motion, the examination was terminated prior to the acquisition of axial T1 weighted and coronal T2 TSE sequences. Brain: Mild generalized cerebral atrophy. Advanced patchy and confluent T2 FLAIR hyperintense signal abnormality within the cerebral white matter, nonspecific but compatible with chronic small vessel ischemic disease. 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 There is no evidence of acute infarct. No appreciable intracranial mass or extra-axial fluid collection. Severe motion degradation of the SWI sequence precludes evaluation for intracranial blood products. No midline shift. Vascular: Maintained flow voids within the proximal large arterial vessels. Skull and upper cervical spine: No focal suspicious marrow lesion. Sinuses/Orbits: No mass or acute finding within the imaged orbits. Minimal mucosal thickening within the bilateral ethmoid and left maxillary sinuses. Other: Small-volume fluid within the left mastoid air cells. IMPRESSION: 1. Prematurely terminated and significantly motion degraded examination, as described. 2. No evidence of acute intracranial abnormality. 3. Advanced chronic small vessel ischemic changes within the cerebral white matter, similar to the prior brain MRI of 06/10/2021. 4. 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 5. Mild for age generalized cerebral atrophy. 6. Small-volume fluid within the left mastoid air cells. Electronically Signed   By: Verlon Pischke Simmering D.O.   On: 04/29/2022 10:23   DG Chest Port 1 View  Result Date: 04/28/2022 CLINICAL DATA:   Questionable sepsis. EXAM: PORTABLE CHEST 1 VIEW COMPARISON:  Chest x-ray 04/28/2022. FINDINGS: Patient is status post cardiac surgery as seen on the prior study. There is stable enlargement of the cardiomediastinal silhouette. There is increasing central pulmonary vascular congestion. There is a new small left pleural effusion. No pneumothorax or focal lung consolidation.  No acute fractures are seen. IMPRESSION: 1. Cardiomegaly with increasing pulmonary vascular congestion and new small left pleural effusion. Electronically Signed   By: Ronney Asters M.D.   On: 04/28/2022 17:11   DG Foot Complete Right  Result Date: 04/28/2022 CLINICAL DATA:  Sepsis EXAM: RIGHT FOOT COMPLETE - 3+ VIEW COMPARISON:  Outside hospital [facility]) radiographs dated 04/20/2022 FINDINGS: 3rd digit amputation at the level of the mid metatarsal shaft. 4th distal metacarpal shaft fracture, healing, unchanged. Lucency along the forefoot (in the region of prior 3rd metatarsal resection) may correspond to the patient's known soft tissue wound. Wound is poorly visualized but appears superficial on the lateral view. No definite cortical destruction to suggest osteomyelitis radiographically, noting limited evaluation. No fracture is seen. Extensive vascular calcifications. IMPRESSION: No radiographic findings of osteomyelitis. 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 4th distal metacarpal shaft fracture, healing, unchanged. Electronically Signed   By: Julian Hy M.D.   On: 04/28/2022 17:56    ROS: As per HPI otherwise negative.  Physical Exam: Vitals:   04/28/22 2330 04/29/22 0011 04/29/22 0324 04/29/22 0814  BP: (!) 181/83 (!) 141/57 138/65 (!) 173/78  Pulse: (!) 114 (!) 113 (!) 122 (!) 110  Resp: (!) 30 (!) 31 (!) 33 20  Temp:  99.7 F (37.6 C) (!) 100.7 F (38.2 C) 99.9 F (37.7 C)  TempSrc:  Oral Oral Oral  SpO2: 93% 90% 92% 95%  Weight:  53.4 kg    Height:  '5\' 8"'$  (1.727 m)       General: Ill  appearing elderly male in NAD.   Only moaning-   Head: Normocephalic, atraumatic, sclera non-icteric, mucus membranes are moist Neck: Supple. JVD elevated 1/4 to mandible. Lungs: Bilateral breath sounds with basilar crackles L>R. Coarse breath sounds upper airway. Tachypnea present RR 36-40.  Heart: Tachy, ST 100-110 on monitor. S1, S2. RRR. No M/R/G.  Abdomen: Soft, non-tender, non-distended with normoactive bowel sounds. No rebound/guarding. No obvious abdominal masses. Lower extremities:without edema or ischemic changes, no open wounds  Neuro: Alert and oriented X 1. Fetal position.  Psych:  Lethargic. Answers questions after much prompting. Dialysis Access: L AVF +T/B  Dialysis Orders: [place] T,Th,S 3:45 hrs 160NRe 350/800 51 kg 2.0K/2.0 Ca AVF -No heparin -Hectorol 1 mcg IV TIW  Assessment/Plan:  SIRS: Work up per primary. WBC 6.5. He is immunosuppressed.   Volume overload: Vascular congestion present on CXR. Has not been meeting EDW at [place] center. Hypertensive. HD today off schedule. UF and lower volume as tolerated.  AMS-CT/MRI of head without acute abnormality. Hold gabapentin. No sedating medications. BUN 28, not uremic. Per primary.  MRI difficult to determine but does not appear to have new CVA  Mild hyperkalemia: HD today off schedule. Use 2.0 K bath. Has rec'd 1 dose lokelma 10 grams PO this AM.   ESRD -  T,Th,S. HD today. Off schedule. No heparin. Will determine tomorrow if he needs additional HD prior to Monday  Hypertension/volume  - See #2. BP has been labile since admission. Still high this AM.Excess volume may be contributing. Amlodipine, carvedilol and midodrine on OP med list. Lower volume in HD today.   Anemia  - HGB 13.2. No OP ESA/Fe. Follow labs.   Metabolic bone disease -  ZD+6.6 but OP PO4 higher than usual. PO4 8.6 Ca+ 8.1 04/26/2022. Continue binders, VDRA.   Nutrition - Albumin 3.5 Renal/Carb mod diet. DMT2 -per primary H/O double lung transplant 2014. Continue  immunosupressive Rx per  primary H/O osteomyelitis R foot/S/P ray amputation: No evidence of osteo on xray. Per primary.  Rita H. Owens Shark, NP-C 04/29/2022, 10:45 AM  [employer] [phone]  Patient seen and examined, agree with above note with above modifications. Very chronically ill HD patient now with fever and altered MS as well as tachycardia-  Being worked up for sepsis -  started on broad spectrum abx-  is still quite altered in HD-  attemping to remove volume but HR is stopping [place]-  will likely need more HD over the weekend-  will determine tomorrow the timing  Corliss Parish, MD 04/29/2022

## 2022-04-29 NOTE — Progress Notes (Signed)
Subjective: No acute overnight events.   Patient was seen at bedside during rounds today. He is still very altered, but intermittently responds to voice. Does not follow commands or track voice.   Pt is updated on the plan for today, and all questions and concerns are addressed.   Objective:  Vital signs in last 24 hours: Vitals:   04/29/22 0011 04/29/22 0324 04/29/22 0814 04/29/22 1100  BP: (!) 141/57 138/65 (!) 173/78 118/83  Pulse: (!) 113 (!) 122 (!) 110 (!) 102  Resp: (!) 31 (!) 33 20 20  Temp: 99.7 F (37.6 C) (!) 100.7 F (38.2 C) 99.9 F (37.7 C) 99.8 F (37.7 C)  TempSrc: Oral Oral Oral Oral  SpO2: 90% 92% 95% 95%  Weight: 53.4 kg     Height: '5\' 8"'$  (1.727 m)       Constitutional: thin, chronically ill appearing man lying curled up in his bed. NAD HENT: normocephalic, atraumatic, mucous membranes moist Eyes: conjunctiva non-erythematous, EOMI Cardiovascular: tachycardiac, regular rhythm, no m/r/g, non-edematous bilateral LE Pulmonary/Chest: normal work of breathing on room air, poor air movement in all lung fields. Crackles in RLL.  Abdominal: soft, non-tender to palpation, non-distended MSK: reduced bulk and tone Neurological: very altered, intermittently responds to voice, unable to follow commands  Skin: warm and dry. Arms and legs are covered in bruises and scratches. Pressure ulcer on right anterior shoulder without any active drainage.     Assessment/Plan:  Principal Problem:   Sepsis ([place]) Active Problems:   Lung transplant status ([place])   Immunocompromised patient ([place])   ESRD on dialysis ([facility])  Tyler Mueller is a 81 y.o. with pertinent PMH of COPD s/p lung transplant in 2004, ESRD on HD TThS, CAD s/p DESx3, osteomyelitis s/p amputation of right third ray who presented with fever and AMS and admit for sepsis with unclear source.  Sepsis with unknown source AMS  Tmax 100.7 at 3 am. He is currently afebrile since being on broad spectrum abx,  tachycardic to 90-110s, normotensive, and satting well on room air. Blood cultures with NGTD. Source of sepsis remains unclear at this time, however respiratory infection high on ddx for this elderly immunocompromised man with hypoxia on arrival, new pleural effusion on imaging, lung exam findings, and procalcitonin 3.4. Soft tissue infection also on ddx given multiple skin lesions. Intracranial pathology ruled out with CT and MRI. XR negative for osteomyelitis. No concern for UTI given he is anuric. B12 and TSH wnl; infection is likely the source of his AMS. - Continue Vanc/Cefepime for at least 48 hours  - Follow up blood cultures - Serum tox  - HIV and RPR ordered for completeness  - Respiratory viral panel  - Obtain C diff if he has watery diarrhea  - Attempted calling wife several times via 3 different contact numbers with no luck; VM full. Will re-attempt contacting wife for collateral to determine if any new medications were started.    ESRD on HD TThS Incomplete HD session yesterday. Increased vascular congestion and new small L pleural effusion. No electrolytes derangements.  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 No radiographic findings of osteomyelitis. Do not suspect this is the source of the patient's sepsis.    Hypertension BP improved to 130/60's.  - Continue home amlodipine 5 mg     Hx of lung transplant in 2004 Hx of Nocardia pulmonary infection -  Continue home tacrolimus, prednisone, bactrim, and azithro   Best Practice: Diet: Renal diet IVF: Fluids: none VTE: heparin injection 5,000 Units  Code: Full   Lajean Manes, MD  Internal Medicine Resident, PGY-1 Pager: [phone] After 5pm on weekdays and 1pm on weekends: On Call pager [phone]

## 2022-04-29 NOTE — Progress Notes (Signed)
removed 664ms net fluid,. unable to remove more due to hypotension and tachcardia as high a 150. gave 4045m total in ns boluses when bp was in 70s and hr in 140s  pre bp 118/49 post bp 112/80 pre weight 51.0kg post weight 49.5kg  gave vancomycin as ordered.  clean patient up dried bm on bottom with full bed change.  2 bandages 2 left forearm avf no bleeding dressing cdi.

## 2022-04-29 NOTE — Progress Notes (Signed)
This nurse attempted to call this patients spouse 2 times for consent for Hemodialysis. No answer and her voice mail is full, Education officer, community informed to assist with contacting spouse and notifying this RN.

## 2022-04-29 NOTE — Plan of Care (Signed)
  Problem: Education: Goal: Knowledge of General Education information will improve Description: Including pain rating scale, medication(s)/side effects and non-pharmacologic comfort measures Outcome: Not Progressing   Problem: Health Behavior/Discharge Planning: Goal: Ability to manage health-related needs will improve Outcome: Not Progressing   

## 2022-04-29 NOTE — Plan of Care (Signed)
  Problem: Education: Goal: Knowledge of General Education information will improve Description Including pain rating scale, medication(s)/side effects and non-pharmacologic comfort measures Outcome: Progressing   Problem: Health Behavior/Discharge Planning: Goal: Ability to manage health-related needs will improve Outcome: Progressing   

## 2022-04-29 NOTE — Progress Notes (Signed)
Pt receives out-pt HD at [facility] on TTS. Pt arrives at 5:05 am for 5:25 chair time. Will assist as needed.   Melven Sartorius Renal Navigator [phone]

## 2022-04-29 NOTE — Progress Notes (Signed)
RN assessed pt, pt is Alert, but not following commands, pt only withdraws to pain. PO meds will not be given due to the pt not following any commands. MD Masters notified. Per MD she will come down to see the pt.

## 2022-04-29 NOTE — Progress Notes (Signed)
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  Paged due to hypotension and decreased mental status following returning from dialysis. 600 cc pulled in HD, pressures at 110/33 prior to starting. Patient assessed at bedside.   Vital signs with MAPs of 52 from pressure cuff on right lower extremity, afebrile.Patient is laying with eyes open, tracks speaker for short time, but unable to follow commands or speak. Mucous membranes dry. Crackles present in right lower lung base, normal respiratory effort. Diffuse bruising. AVF with bruit in left arm.  A: Appears to be hypovolemic following returning from dialysis.  1/2 L NS bolus EKG  Addendum 2130: EKG showed prolonged Qtc, MAP remains in 50s following bolus.  CBC, CMP, ABG 1/2 L NS Transfer to progressive  Addendum 2250: ABG with mild alkalosis with pO2 at 47. I called and spoke with Respiratory for non-rebreather mask, CTA showed no signs of pulmonary embolism. Showed trace bilateral pleural effusions L>R with no frank interstitial edema.  Addendum 0092: Repeat ABG showed pO2 of 57. Secure chart from [facility] RN for concern that sample could be venous. I called and spoke with respiratory and they were in agreement. Patient's mentation has improved and he was placed back on 3L [place].  P: Mentation improved with nonrebreather vs fluids. Overall patient's mentation is not at baseline despite being on broad spectrum antibiotics for 24 hours. Lab evaluation showed normocytic anemia with thrombocytopenia, lactic acid wnl, RFP reflected ESRD with hyperphos and hypoalbum. From current work-up metabolic etiology for acute encephalopathy is unrevealing. It is possible that patient has infection, but is not able to mount leukocytosis, however he is not improving on current regimen of cefepime and vancomycin. No signs of structural causes from MRI  completed 5/26 after encephalopathy began. Will discuss case with day team, but could consider further evaluation with LP for encephalitis.

## 2022-04-30 ENCOUNTER — Inpatient Hospital Stay (HOSPITAL_COMMUNITY): Payer: Medicare Other

## 2022-04-30 ENCOUNTER — Encounter (HOSPITAL_COMMUNITY): Payer: Self-pay | Admitting: Internal Medicine

## 2022-04-30 DIAGNOSIS — L97909 Non-pressure chronic ulcer of unspecified part of unspecified lower leg with unspecified severity: Secondary | ICD-10-CM | POA: Insufficient documentation

## 2022-04-30 DIAGNOSIS — G9340 Encephalopathy, unspecified: Secondary | ICD-10-CM | POA: Insufficient documentation

## 2022-04-30 DIAGNOSIS — I739 Peripheral vascular disease, unspecified: Secondary | ICD-10-CM

## 2022-04-30 DIAGNOSIS — Z942 Lung transplant status: Secondary | ICD-10-CM

## 2022-04-30 DIAGNOSIS — D696 Thrombocytopenia, unspecified: Secondary | ICD-10-CM

## 2022-04-30 DIAGNOSIS — Z89421 Acquired absence of other right toe(s): Secondary | ICD-10-CM

## 2022-04-30 DIAGNOSIS — N186 End stage renal disease: Secondary | ICD-10-CM

## 2022-04-30 DIAGNOSIS — R6520 Severe sepsis without septic shock: Secondary | ICD-10-CM

## 2022-04-30 DIAGNOSIS — D849 Immunodeficiency, unspecified: Secondary | ICD-10-CM | POA: Diagnosis not present

## 2022-04-30 DIAGNOSIS — A419 Sepsis, unspecified organism: Secondary | ICD-10-CM | POA: Diagnosis not present

## 2022-04-30 DIAGNOSIS — R233 Spontaneous ecchymoses: Secondary | ICD-10-CM

## 2022-04-30 DIAGNOSIS — Z992 Dependence on renal dialysis: Secondary | ICD-10-CM

## 2022-04-30 DIAGNOSIS — A689 Relapsing fever, unspecified: Secondary | ICD-10-CM | POA: Insufficient documentation

## 2022-04-30 LAB — RENAL FUNCTION PANEL
Albumin: 2.7 g/dL — ABNORMAL LOW (ref 3.5–5.0)
Anion gap: 12 (ref 5–15)
BUN: 20 mg/dL (ref 8–23)
CO2: 23 mmol/L (ref 22–32)
Calcium: 7.5 mg/dL — ABNORMAL LOW (ref 8.9–10.3)
Chloride: 98 mmol/L (ref 98–111)
Creatinine, Ser: 3.4 mg/dL — ABNORMAL HIGH (ref 0.61–1.24)
GFR, Estimated: 18 mL/min — ABNORMAL LOW (ref >60)
Glucose, Bld: 72 mg/dL (ref 70–99)
Phosphorus: 5.6 mg/dL — ABNORMAL HIGH (ref 2.5–4.6)
Potassium: 4.1 mmol/L (ref 3.5–5.1)
Sodium: 133 mmol/L — ABNORMAL LOW (ref 135–145)

## 2022-04-30 LAB — CBC
HCT: 40 % (ref 39.0–52.0)
HCT: 38.7 % — ABNORMAL LOW (ref 39.0–52.0)
Hemoglobin: 12.4 g/dL — ABNORMAL LOW (ref 13.0–17.0)
Hemoglobin: 12.3 g/dL — ABNORMAL LOW (ref 13.0–17.0)
MCH: 30 pg (ref 26.0–34.0)
MCH: 30.8 pg (ref 26.0–34.0)
MCHC: 31 g/dL (ref 30.0–36.0)
MCHC: 31.8 g/dL (ref 30.0–36.0)
MCV: 96.9 fL (ref 80.0–100.0)
MCV: 96.8 fL (ref 80.0–100.0)
Platelets: 99 10*3/uL — ABNORMAL LOW (ref 150–400)
Platelets: 94 10*3/uL — ABNORMAL LOW (ref 150–400)
RBC: 4.13 MIL/uL — ABNORMAL LOW (ref 4.22–5.81)
RBC: 4 MIL/uL — ABNORMAL LOW (ref 4.22–5.81)
RDW: 15.6 % — ABNORMAL HIGH (ref 11.5–15.5)
RDW: 15.6 % — ABNORMAL HIGH (ref 11.5–15.5)
WBC: 4.1 10*3/uL (ref 4.0–10.5)
WBC: 4 10*3/uL (ref 4.0–10.5)
nRBC: 0 % (ref 0.0–0.2)
nRBC: 0 % (ref 0.0–0.2)

## 2022-04-30 LAB — BLOOD GAS, ARTERIAL
Acid-Base Excess: 2 mmol/L (ref 0.0–2.0)
Bicarbonate: 27.3 mmol/L (ref 20.0–28.0)
Drawn by: 65022
O2 Saturation: 88.9 %
Patient temperature: 37.1
pCO2 arterial: 44 mmHg (ref 32–48)
pH, Arterial: 7.4 (ref 7.35–7.45)
pO2, Arterial: 57 mmHg — ABNORMAL LOW (ref 83–108)

## 2022-04-30 LAB — RESPIRATORY PANEL BY PCR

## 2022-04-30 LAB — BASIC METABOLIC PANEL
Anion gap: 14 (ref 5–15)
BUN: 18 mg/dL (ref 8–23)
CO2: 23 mmol/L (ref 22–32)
Calcium: 7.5 mg/dL — ABNORMAL LOW (ref 8.9–10.3)
Chloride: 101 mmol/L (ref 98–111)
Creatinine, Ser: 3.07 mg/dL — ABNORMAL HIGH (ref 0.61–1.24)
GFR, Estimated: 20 mL/min — ABNORMAL LOW (ref >60)
Glucose, Bld: 74 mg/dL (ref 70–99)
Potassium: 3.9 mmol/L (ref 3.5–5.1)
Sodium: 138 mmol/L (ref 135–145)

## 2022-04-30 LAB — MAGNESIUM: Magnesium: 2 mg/dL (ref 1.7–2.4)

## 2022-04-30 LAB — RPR: RPR Ser Ql: NONREACTIVE

## 2022-04-30 LAB — CRYPTOCOCCAL ANTIGEN: Crypto Ag: NEGATIVE

## 2022-04-30 LAB — LACTIC ACID, PLASMA
Lactic Acid, Venous: 1.3 mmol/L (ref 0.5–1.9)
Lactic Acid, Venous: 1.3 mmol/L (ref 0.5–1.9)

## 2022-04-30 LAB — TECHNOLOGIST SMEAR REVIEW

## 2022-04-30 LAB — HIV-1 RNA QUANT-NO REFLEX-BLD
HIV 1 RNA Quant: <20 copies/mL
LOG10 HIV-1 RNA: UNDETERMINED log10copy/mL

## 2022-04-30 LAB — PROCALCITONIN: Procalcitonin: 4.49 ng/mL

## 2022-04-30 MED ORDER — IOHEXOL 350 MG/ML SOLN: 75.0000 mL | Freq: Once | INTRAVENOUS | Status: DC | PRN

## 2022-04-30 MED ORDER — IOHEXOL 350 MG/ML SOLN
65.0000 mL | Freq: Once | INTRAVENOUS | Status: AC | PRN
  Administered 2022-04-30: 65 mL via INTRAVENOUS

## 2022-04-30 MED ORDER — SODIUM CHLORIDE 0.9 % IV SOLN
100.0000 mg | Freq: Two times a day (BID) | INTRAVENOUS | Status: DC
  Administered 2022-04-30 – 2022-05-01 (×2): 100 mg via INTRAVENOUS
  Filled 2022-04-30 (×4): qty 100

## 2022-04-30 NOTE — Plan of Care (Signed)
  Problem: Education: Goal: Knowledge of General Education information will improve Description Including pain rating scale, medication(s)/side effects and non-pharmacologic comfort measures Outcome: Progressing   Problem: Health Behavior/Discharge Planning: Goal: Ability to manage health-related needs will improve Outcome: Progressing   

## 2022-04-30 NOTE — Progress Notes (Signed)
Received phone call from transport coordinator at [facility] to set up transport to following address:  [facility] [address] [place], [place] [address]  Bed # [phone] RN to call report once transport arrives to [phone]  [facility] requests [facility] power-share all images  Accepting provider: Lucilla Edin, MD

## 2022-04-30 NOTE — Consult Note (Incomplete Revision)
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 [facility] End-stage renal disease on hemodialysis Thrombocytopenia Multiple ecchymotic areas throughout Shoulder and back pain  Plan:  Add doxycycline in case patient has  ehrlichia or [facility] spotted fever DC azithromycin If confusion persists would strongly consider LP-BUT WOULD 100% NEED to coordinate this with Transplant Team at [facility] in terms of labs  Would similarly strongly consider transfer to [place] especially if he does not  promptly improve  Would consider MRI without contrast right foot though on exam it appears fairly benign similarly could consider MRI ofhis T spine where he has compression fractures to ensure no evidence of infection there  I will check a serum, cryptococcal antigen urine histoplasma antigen if he makes any urine, beta glucan, galactommann   IF he comes more lucid would be critical to have conversation with him about his foot and  back and shoulder pain which his wife says his been other him for some time.   Principal Problem:   Sepsis ([place]) Active Problems:   Lung transplant status ([place])   Immunocompromised patient ([place])   ESRD on dialysis ([place])   Scheduled Meds:  amLODipine  5 mg Oral Daily   azithromycin  250 mg Oral Q M,W,F   Chlorhexidine Gluconate Cloth  6 each Topical Q0600   heparin  5,000 Units Subcutaneous Q8H   predniSONE  5 mg Oral Daily   sulfamethoxazole-trimethoprim  1 tablet Oral QPM   tacrolimus  0.5 mg Oral BID   vancomycin variable dose per unstable renal function (pharmacist dosing)   Does not apply See admin instructions   Continuous Infusions:  ceFEPime (MAXIPIME)  IV 1 g (04/29/22 1927)   PRN Meds:.acetaminophen **OR** acetaminophen, senna-docusate, traMADol  HPI: Tyler Mueller is a 81 y.o. male history of double lung transplant in 2004 [facility] and typically followed there and admitted there for his medical problems who also has a history of end-stage renal disease on hemodialysis, peripheral vascular disease who was admitted to [facility] in March 2023 and underwent femoral to below the knee popliteal bypass with PTFE by Dr. Trula Slade on February 23, 2022.  Dr. Sharol Given at that time also performed right third ray amputation with placement of skin substitution and wound vacuum.  In that hospitalization he did have an episode of postoperative rigors and chills that then resolved without further problems.   Currently then had further drainage from his wound prompting initiation of doxycycline and close follow-up by orthopedic surgery here.  Wound to however apparently appeared to be improving.  Had been in his normal state of health and even had been mowing the lawn according to his wife with whom I obtained the majority of his history beyond what was found in the medical chart.  She tells me that he has a history of becoming confused if he misses dialysis or is not able to have complete dialysis.  Apparently late last week he was not able to complete dialysis due to low blood pressure.  Over the weekend he developed chills and rigors and went to bed.  On the day of admission he again could not complete  dialysis because he was again hypotensive.  He was brought to the emergency department where he was tachycardic and febrile to 102 2 degrees.  Based on the admitting H&P the patient actually was engaged in conversation and was concerned about his foot though there was no increased drainage there.  Blood cultures were drawn on admission which have been sterile so far.  He has been on broad-spectrum antibiotics in the form of vancomycin  cefepime Flagyl and more recently vancomycin cefepime and azithromycin.  Work-up including chest x-ray showed some cardiomegaly with some pulmonary vascular congestion and a small left-sided pleural effusion.  MRI of the brain showed some cerebral atrophy and remote lacunar infarct as well as some fluid in the sinuses but otherwise was unremarkable  CT angiogram of the chest showed no evidence of pulmonary embolism and a trace bilateral pleural effusions left greater than right with no frank interstitial edema also with compression fractures at T9 and T12  He does have new onset thrombocytopenia with this admission which has progressively worsened.  While there could be many causes for this given the fact that he is been mowing his long in [place] we should not neglect the possibility that he has a tickborne infection specifically with Rocky mounted spotted fever or Ehrlichia.  I will therefore add doxycycline.  We will discontinue azithromycin as a see no need for at this present time.  In terms of further work-up that could be done.  If he fails to improve in terms of his cognition would strongly consider lumbar puncture with large-volume of CSF removed with opening and closing pressures.  If CSF is taken would send for cell count differential, protein glucose culture.  Would send dedicated 8 to 10 cc for fungal culture as well as 8-10 for AFB stain and culture.  1 could send off CSF for histoplasma antigen and Blastomyces antigen and cryptococcal antigen  1 could consider HSV PCR as well as other PCR such as HSV 7 and 8  I would however if we pursue a lumbar puncture in this gentleman insist that this be coordinated with [facility] transplant team.  In terms of imaging we could do here 1 could consider MRI of his foot though his exam is fairly benign he also has compression fractures on CT scan but perhaps he could have infection there that is not picked up on this imaging modality  and an MRI might be a better way of looking at it.  I spent 124 minutes with the patient including time spent discussing case with his wife over the phonep personally reviewing CT angiogram of the chest plain films of the foot MRI of the brain updated cultures CBC CMP along with review of medical records in preparation for the visit and during the visit and in coordination of his care.   Review of Systems: Review of Systems  Unable to perform ROS: Acuity of condition   Past Medical History:  Diagnosis Date   Diabetes mellitus without complication ([place])    Diverticulitis    ESRD on hemodialysis ([place])    RENAL INSUFFICIENCY   History of lung transplant ([place]) 04/21/2014   Hypertension    Lung transplanted ([place]) 12/05/2002   BILATERAL    Muscle spasm of back 04/21/2014   Skin cancer of face     Social History   Tobacco Use   Smoking status: Former    Types: Cigarettes    Quit date: 04/22/2003    Years since quitting: 95.0  Smokeless tobacco: Never  Substance Use Topics   Alcohol use: No    Comment: QUIT ALCOHOL 2004   Drug use: No    History reviewed. No pertinent family history. Allergies  Allergen Reactions   Lorazepam Other (See Comments)    delirium    Morphine And Related Nausea And Vomiting and Other (See Comments)    Hallucinations    Nsaids Other (See Comments)     Kidney disorder   Penicillins Nausea And Vomiting and Other (See Comments)    Hallucinations    Amphetamine-Dextroamphetamine Anxiety and Other (See Comments)    confusion     OBJECTIVE: Blood pressure (!) 137/53, pulse 91, temperature 97.8 F (36.6 C), temperature source Oral, resp. rate (!) 25, height '5\' 8"'$  (1.727 m), weight 49.5 kg, SpO2 93 %.  Physical Exam Constitutional:      Appearance: He is ill-appearing.  HENT:     Head: Normocephalic and atraumatic.  Eyes:     Extraocular Movements: Extraocular movements intact.     Conjunctiva/sclera: Conjunctivae normal.  Cardiovascular:      Rate and Rhythm: Tachycardia present.     Heart sounds: No murmur heard.   No gallop.  Pulmonary:     Effort: Pulmonary effort is normal. No respiratory distress.     Breath sounds: No stridor. No wheezing or rhonchi.  Abdominal:     General: Bowel sounds are normal. There is no distension.     Palpations: There is no mass.  Skin:    Findings: Bruising present.  Neurological:     Mental Status: He is disoriented.  Psychiatric:        Attention and Perception: He is inattentive.        Speech: He is noncommunicative.        Behavior: Behavior is uncooperative.        Cognition and Memory: Cognition is impaired. Memory is impaired. He exhibits impaired recent memory and impaired remote memory.   Right foot wound seems well healed  Lab Results Lab Results  Component Value Date   WBC 4.0 04/30/2022   HGB 12.3 (L) 04/30/2022   HCT 38.7 (L) 04/30/2022   MCV 96.8 04/30/2022   PLT 94 (L) 04/30/2022    Lab Results  Component Value Date   CREATININE 3.40 (H) 04/30/2022   BUN 20 04/30/2022   NA 133 (L) 04/30/2022   K 4.1 04/30/2022   CL 98 04/30/2022   CO2 23 04/30/2022    Lab Results  Component Value Date   ALT 21 04/28/2022   AST 39 04/28/2022   ALKPHOS 83 04/28/2022   BILITOT 0.8 04/28/2022     Microbiology: Recent Results (from the past 240 hour(s))  Resp Panel by RT-PCR (Flu A&B, Covid) Anterior Nasal Swab     Status: None   Collection Time: 04/28/22  4:38 PM   Specimen: Anterior Nasal Swab  Result Value Ref Range Status   SARS Coronavirus 2 by RT PCR NEGATIVE NEGATIVE Final    Comment: (NOTE) SARS-CoV-2 target nucleic acids are NOT DETECTED.  The SARS-CoV-2 RNA is generally detectable in upper respiratory specimens during the acute phase of infection. The lowest concentration of SARS-CoV-2 viral copies this assay can detect is 138 copies/mL. A negative result does not preclude SARS-Cov-2 infection and should not be used as the sole basis for treatment  or other patient management decisions. A negative result may occur with  improper specimen collection/handling, submission of specimen other than nasopharyngeal swab, presence of viral mutation(s) within  the areas targeted by this assay, and inadequate number of viral copies(<138 copies/mL). A negative result must be combined with clinical observations, patient history, and epidemiological information. The expected result is Negative.  Fact Sheet for Patients:  EntrepreneurPulse.com.au  Fact Sheet for Healthcare Providers:  IncredibleEmployment.be  This test is no t yet approved or cleared by the [place] FDA and  has been authorized for detection and/or diagnosis of SARS-CoV-2 by FDA under an Emergency Use Authorization (EUA). This EUA will remain  in effect (meaning this test can be used) for the duration of the COVID-19 declaration under Section 564(b)(1) of the Act, 21 U.S.C.section 360bbb-3(b)(1), unless the authorization is terminated  or revoked sooner.       Influenza A by PCR NEGATIVE NEGATIVE Final   Influenza B by PCR NEGATIVE NEGATIVE Final    Comment: (NOTE) The Xpert Xpress SARS-CoV-2/FLU/RSV plus assay is intended as an aid in the diagnosis of influenza from Nasopharyngeal swab specimens and should not be used as a sole basis for treatment. Nasal washings and aspirates are unacceptable for Xpert Xpress SARS-CoV-2/FLU/RSV testing.  Fact Sheet for Patients: EntrepreneurPulse.com.au  Fact Sheet for Healthcare Providers: IncredibleEmployment.be  This test is not yet approved or cleared by the [place] FDA and has been authorized for detection and/or diagnosis of SARS-CoV-2 by FDA under an Emergency Use Authorization (EUA). This EUA will remain in effect (meaning this test can be used) for the duration of the COVID-19 declaration under Section 564(b)(1) of the Act, 21 U.S.C. section  360bbb-3(b)(1), unless the authorization is terminated or revoked.  Performed at [facility], [place] [address], [place], [place] [address]   Blood Culture (routine x 2)     Status: None (Preliminary result)   Collection Time: 04/28/22  4:38 PM   Specimen: BLOOD RIGHT HAND  Result Value Ref Range Status   Specimen Description BLOOD RIGHT HAND  Final   Special Requests   Final    BOTTLES DRAWN AEROBIC AND ANAEROBIC Blood Culture adequate volume   Culture   Final    NO GROWTH 2 DAYS Performed at [facility], [place] [address] [place], [place] [address]    Report Status PENDING  Incomplete  Blood Culture (routine x 2)     Status: None (Preliminary result)   Collection Time: 04/28/22  5:13 PM   Specimen: BLOOD  Result Value Ref Range Status   Specimen Description BLOOD RIGHT ANTECUBITAL  Final   Special Requests   Final    BOTTLES DRAWN AEROBIC AND ANAEROBIC Blood Culture adequate volume   Culture   Final    NO GROWTH 2 DAYS Performed at [facility], [place] [address], [place], [place] [address]    Report Status PENDING  Incomplete  MRSA Next Gen by PCR, Nasal     Status: None   Collection Time: 04/28/22  8:43 PM   Specimen: Nasal Mucosa; Nasal Swab  Result Value Ref Range Status   MRSA by PCR Next Gen NOT DETECTED NOT DETECTED Final    Comment: (NOTE) The GeneXpert MRSA Assay (FDA approved for NASAL specimens only), is one component of a comprehensive MRSA colonization surveillance program. It is not intended to diagnose MRSA infection nor to guide or monitor treatment for MRSA infections. Test performance is not FDA approved in patients less than 16 years old. Performed at [facility], [place] [address] [place], [place] [address]   Respiratory (~20 pathogens) panel by PCR  Status: None   Collection Time: 04/29/22 11:05 AM   Specimen: Nasopharyngeal Swab; Respiratory  Result Value Ref Range Status   Adenovirus NOT DETECTED NOT DETECTED Final    Coronavirus 229E NOT DETECTED NOT DETECTED Final    Comment: (NOTE) The Coronavirus on the Respiratory Panel, DOES NOT test for the novel  Coronavirus (2019 nCoV)    Coronavirus HKU1 NOT DETECTED NOT DETECTED Final   Coronavirus NL63 NOT DETECTED NOT DETECTED Final   Coronavirus OC43 NOT DETECTED NOT DETECTED Final   Metapneumovirus NOT DETECTED NOT DETECTED Final   Rhinovirus / Enterovirus NOT DETECTED NOT DETECTED Final   Influenza A NOT DETECTED NOT DETECTED Final   Influenza B NOT DETECTED NOT DETECTED Final   Parainfluenza Virus 1 NOT DETECTED NOT DETECTED Final   Parainfluenza Virus 2 NOT DETECTED NOT DETECTED Final   Parainfluenza Virus 3 NOT DETECTED NOT DETECTED Final   Parainfluenza Virus 4 NOT DETECTED NOT DETECTED Final   Respiratory Syncytial Virus NOT DETECTED NOT DETECTED Final   Bordetella pertussis NOT DETECTED NOT DETECTED Final   Bordetella Parapertussis NOT DETECTED NOT DETECTED Final   Chlamydophila pneumoniae NOT DETECTED NOT DETECTED Final   Mycoplasma pneumoniae NOT DETECTED NOT DETECTED Final    Comment: Performed at [facility], [place]. [address], [place], [place] [address]    Alcide Evener, [facility] [phone] pager  04/30/2022, 1:51 PM

## 2022-04-30 NOTE — Consult Note (Signed)
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 [facility] End-stage renal disease on hemodialysis Thrombocytopenia Multiple ecchymotic areas throughout Shoulder and back pain  Plan:  Add doxycycline in case patient has  ehrlichia or [facility] spotted fever DC azithromycin If confusion persists would strongly consider LP-BUT WOULD 100% NEED to coordinate this with Transplant Team at [facility] in terms of labs  Would similarly strongly consider transfer to [place] especially if he does not  promptly improve  Would consider MRI without contrast right foot though on exam it appears fairly benign similarly could consider MRI ofhis T spine where he has compression fractures to ensure no evidence of infection there  I will check a serum, cryptococcal antigen urine histoplasma antigen if he makes any urine, beta glucan, galactommann   IF he comes more lucid would be critical to have conversation with him about his foot and  back and shoulder pain which his wife says his been other him for some time.   Principal Problem:   Sepsis ([place]) Active Problems:   Lung transplant status ([place])   Immunocompromised patient ([place])   ESRD on dialysis ([place])   Scheduled Meds:  amLODipine  5 mg Oral Daily   azithromycin  250 mg Oral Q M,W,F   Chlorhexidine Gluconate Cloth  6 each Topical Q0600   heparin  5,000 Units Subcutaneous Q8H   predniSONE  5 mg Oral Daily   sulfamethoxazole-trimethoprim  1 tablet Oral QPM   tacrolimus  0.5 mg Oral BID   vancomycin variable dose per unstable renal function (pharmacist dosing)   Does not apply See admin instructions   Continuous Infusions:  ceFEPime (MAXIPIME)  IV 1 g (04/29/22 1927)   PRN Meds:.acetaminophen **OR** acetaminophen, senna-docusate, traMADol  HPI: Tyler Mueller is a 81 y.o. male history of double lung transplant in 2004 [facility] and typically followed there and admitted there for his medical problems who also has a history of end-stage renal disease on hemodialysis, peripheral vascular disease who was admitted to [facility] in March 2023 and underwent femoral to below the knee popliteal bypass with PTFE by Dr. Trula Slade on February 23, 2022.  Dr. Sharol Given at that time also performed right third ray amputation with placement of skin substitution and wound vacuum.  In that hospitalization he did have an episode of postoperative rigors and chills that then resolved without further problems.   Currently then had further drainage from his wound prompting initiation of doxycycline and close follow-up by orthopedic surgery here.  Wound to however apparently appeared to be improving.  Had been in his normal state of health and even had been mowing the lawn according to his wife with whom I obtained the majority of his history beyond what was found in the medical chart.  She tells me that he has a history of becoming confused if he misses dialysis or is not able to have complete dialysis.  Apparently late last week he was not able to complete dialysis due to low blood pressure.  Over the weekend he developed chills and rigors and went to bed.  On the day of admission he again could not complete  dialysis because he was again hypotensive.  He was brought to the emergency department where he was tachycardic and febrile to 102 2 degrees.  Based on the admitting H&P the patient actually was engaged in conversation and was concerned about his foot though there was no increased drainage there.  Blood cultures were drawn on admission which have been sterile so far.  He has been on broad-spectrum antibiotics in the form of vancomycin  cefepime Flagyl and more recently vancomycin cefepime and azithromycin.  Work-up including chest x-ray showed some cardiomegaly with some pulmonary vascular congestion and a small left-sided pleural effusion.  MRI of the brain showed some cerebral atrophy and remote lacunar infarct as well as some fluid in the sinuses but otherwise was unremarkable  CT angiogram of the chest showed no evidence of pulmonary embolism and a trace bilateral pleural effusions left greater than right with no frank interstitial edema also with compression fractures at T9 and T12  He does have new onset thrombocytopenia with this admission which has progressively worsened.  While there could be many causes for this given the fact that he is been mowing his long in [place] we should not neglect the possibility that he has a tickborne infection specifically with [place] spotted fever or Ehrlichia.  I will therefore add doxycycline.  We will discontinue azithromycin as a see no need for at this present time.  In terms of further work-up that could be done.  If he fails to improve in terms of his cognition would strongly consider lumbar puncture with large-volume of CSF removed with opening and closing pressures.  If CSF is taken would send for cell count differential, protein glucose culture.  Would send dedicated 8 to 10 cc for fungal culture as well as 8-10 for AFB stain and culture.  1 could send off CSF for histoplasma antigen and Blastomyces antigen and cryptococcal antigen  1 could consider HSV PCR as well as other PCR such as HSV 7 and 8  I would however if we pursue a lumbar puncture in this gentleman insist that this be coordinated with [facility] transplant team.  In terms of imaging we could do here 1 could consider MRI of his foot though his exam is fairly benign he also has compression fractures on CT scan but perhaps he could have infection there that is not picked up on this imaging modality  and an MRI might be a better way of looking at it.  I spent 124 minutes with the patient including time spent discussing case with his wife over the phonep personally reviewing CT angiogram of the chest plain films of the foot MRI of the brain updated cultures CBC CMP along with review of medical records in preparation for the visit and during the visit and in coordination of his care.   Review of Systems: Review of Systems  Unable to perform ROS: Acuity of condition   Past Medical History:  Diagnosis Date   Diabetes mellitus without complication ([place])    Diverticulitis    ESRD on hemodialysis ([place])    RENAL INSUFFICIENCY   History of lung transplant ([place]) 04/21/2014   Hypertension    Lung transplanted ([place]) 12/05/2002   BILATERAL    Muscle spasm of back 04/21/2014   Skin cancer of face     Social History   Tobacco Use   Smoking status: Former    Types: Cigarettes    Quit date: 04/22/2003    Years since quitting: 68.0  Smokeless tobacco: Never  Substance Use Topics   Alcohol use: No    Comment: QUIT ALCOHOL 2004   Drug use: No    History reviewed. No pertinent family history. Allergies  Allergen Reactions   Lorazepam Other (See Comments)    delirium    Morphine And Related Nausea And Vomiting and Other (See Comments)    Hallucinations    Nsaids Other (See Comments)     Kidney disorder   Penicillins Nausea And Vomiting and Other (See Comments)    Hallucinations    Amphetamine-Dextroamphetamine Anxiety and Other (See Comments)    confusion     OBJECTIVE: Blood pressure (!) 137/53, pulse 91, temperature 97.8 F (36.6 C), temperature source Oral, resp. rate (!) 25, height '5\' 8"'$  (1.727 m), weight 49.5 kg, SpO2 93 %.  Physical Exam Constitutional:      Appearance: He is ill-appearing.  HENT:     Head: Normocephalic and atraumatic.  Eyes:     Extraocular Movements: Extraocular movements intact.     Conjunctiva/sclera: Conjunctivae normal.  Cardiovascular:      Rate and Rhythm: Tachycardia present.     Heart sounds: No murmur heard.   No gallop.  Pulmonary:     Effort: Pulmonary effort is normal. No respiratory distress.     Breath sounds: No stridor. No wheezing or rhonchi.  Abdominal:     General: Bowel sounds are normal. There is no distension.     Palpations: There is no mass.  Skin:    Findings: Bruising present.  Neurological:     Mental Status: He is disoriented.  Psychiatric:        Attention and Perception: He is inattentive.        Speech: He is noncommunicative.        Behavior: Behavior is uncooperative.        Cognition and Memory: Cognition is impaired. Memory is impaired. He exhibits impaired recent memory and impaired remote memory.   Right foot wound seems well healed  Lab Results Lab Results  Component Value Date   WBC 4.0 04/30/2022   HGB 12.3 (L) 04/30/2022   HCT 38.7 (L) 04/30/2022   MCV 96.8 04/30/2022   PLT 94 (L) 04/30/2022    Lab Results  Component Value Date   CREATININE 3.40 (H) 04/30/2022   BUN 20 04/30/2022   NA 133 (L) 04/30/2022   K 4.1 04/30/2022   CL 98 04/30/2022   CO2 23 04/30/2022    Lab Results  Component Value Date   ALT 21 04/28/2022   AST 39 04/28/2022   ALKPHOS 83 04/28/2022   BILITOT 0.8 04/28/2022     Microbiology: Recent Results (from the past 240 hour(s))  Resp Panel by RT-PCR (Flu A&B, Covid) Anterior Nasal Swab     Status: None   Collection Time: 04/28/22  4:38 PM   Specimen: Anterior Nasal Swab  Result Value Ref Range Status   SARS Coronavirus 2 by RT PCR NEGATIVE NEGATIVE Final    Comment: (NOTE) SARS-CoV-2 target nucleic acids are NOT DETECTED.  The SARS-CoV-2 RNA is generally detectable in upper respiratory specimens during the acute phase of infection. The lowest concentration of SARS-CoV-2 viral copies this assay can detect is 138 copies/mL. A negative result does not preclude SARS-Cov-2 infection and should not be used as the sole basis for treatment  or other patient management decisions. A negative result may occur with  improper specimen collection/handling, submission of specimen other than nasopharyngeal swab, presence of viral mutation(s) within  the areas targeted by this assay, and inadequate number of viral copies(<138 copies/mL). A negative result must be combined with clinical observations, patient history, and epidemiological information. The expected result is Negative.  Fact Sheet for Patients:  EntrepreneurPulse.com.au  Fact Sheet for Healthcare Providers:  IncredibleEmployment.be  This test is no t yet approved or cleared by the [place] FDA and  has been authorized for detection and/or diagnosis of SARS-CoV-2 by FDA under an Emergency Use Authorization (EUA). This EUA will remain  in effect (meaning this test can be used) for the duration of the COVID-19 declaration under Section 564(b)(1) of the Act, 21 U.S.C.section 360bbb-3(b)(1), unless the authorization is terminated  or revoked sooner.       Influenza A by PCR NEGATIVE NEGATIVE Final   Influenza B by PCR NEGATIVE NEGATIVE Final    Comment: (NOTE) The Xpert Xpress SARS-CoV-2/FLU/RSV plus assay is intended as an aid in the diagnosis of influenza from Nasopharyngeal swab specimens and should not be used as a sole basis for treatment. Nasal washings and aspirates are unacceptable for Xpert Xpress SARS-CoV-2/FLU/RSV testing.  Fact Sheet for Patients: EntrepreneurPulse.com.au  Fact Sheet for Healthcare Providers: IncredibleEmployment.be  This test is not yet approved or cleared by the [place] FDA and has been authorized for detection and/or diagnosis of SARS-CoV-2 by FDA under an Emergency Use Authorization (EUA). This EUA will remain in effect (meaning this test can be used) for the duration of the COVID-19 declaration under Section 564(b)(1) of the Act, 21 U.S.C. section  360bbb-3(b)(1), unless the authorization is terminated or revoked.  Performed at [facility], [place] [address], [place], [place] [address]   Blood Culture (routine x 2)     Status: None (Preliminary result)   Collection Time: 04/28/22  4:38 PM   Specimen: BLOOD RIGHT HAND  Result Value Ref Range Status   Specimen Description BLOOD RIGHT HAND  Final   Special Requests   Final    BOTTLES DRAWN AEROBIC AND ANAEROBIC Blood Culture adequate volume   Culture   Final    NO GROWTH 2 DAYS Performed at [facility], [place] [address] [place], [place] [address]    Report Status PENDING  Incomplete  Blood Culture (routine x 2)     Status: None (Preliminary result)   Collection Time: 04/28/22  5:13 PM   Specimen: BLOOD  Result Value Ref Range Status   Specimen Description BLOOD RIGHT ANTECUBITAL  Final   Special Requests   Final    BOTTLES DRAWN AEROBIC AND ANAEROBIC Blood Culture adequate volume   Culture   Final    NO GROWTH 2 DAYS Performed at [facility], [place] [address] [place], [place] [address]    Report Status PENDING  Incomplete  MRSA Next Gen by PCR, Nasal     Status: None   Collection Time: 04/28/22  8:43 PM   Specimen: Nasal Mucosa; Nasal Swab  Result Value Ref Range Status   MRSA by PCR Next Gen NOT DETECTED NOT DETECTED Final    Comment: (NOTE) The GeneXpert MRSA Assay (FDA approved for NASAL specimens only), is one component of a comprehensive MRSA colonization surveillance program. It is not intended to diagnose MRSA infection nor to guide or monitor treatment for MRSA infections. Test performance is not FDA approved in patients less than 60 years old. Performed at [facility], [place] [address] [place], [place] [address]   Respiratory (~20 pathogens) panel by PCR  Status: None   Collection Time: 04/29/22 11:05 AM   Specimen: Nasopharyngeal Swab; Respiratory  Result Value Ref Range Status   Adenovirus NOT DETECTED NOT DETECTED Final    Coronavirus 229E NOT DETECTED NOT DETECTED Final    Comment: (NOTE) The Coronavirus on the Respiratory Panel, DOES NOT test for the novel  Coronavirus (2019 nCoV)    Coronavirus HKU1 NOT DETECTED NOT DETECTED Final   Coronavirus NL63 NOT DETECTED NOT DETECTED Final   Coronavirus OC43 NOT DETECTED NOT DETECTED Final   Metapneumovirus NOT DETECTED NOT DETECTED Final   Rhinovirus / Enterovirus NOT DETECTED NOT DETECTED Final   Influenza A NOT DETECTED NOT DETECTED Final   Influenza B NOT DETECTED NOT DETECTED Final   Parainfluenza Virus 1 NOT DETECTED NOT DETECTED Final   Parainfluenza Virus 2 NOT DETECTED NOT DETECTED Final   Parainfluenza Virus 3 NOT DETECTED NOT DETECTED Final   Parainfluenza Virus 4 NOT DETECTED NOT DETECTED Final   Respiratory Syncytial Virus NOT DETECTED NOT DETECTED Final   Bordetella pertussis NOT DETECTED NOT DETECTED Final   Bordetella Parapertussis NOT DETECTED NOT DETECTED Final   Chlamydophila pneumoniae NOT DETECTED NOT DETECTED Final   Mycoplasma pneumoniae NOT DETECTED NOT DETECTED Final    Comment: Performed at [facility], [place] [address], [place], [place] [address]    Alcide Evener, [facility] [phone] pager  04/30/2022, 1:51 PM

## 2022-04-30 NOTE — Significant Event (Signed)
Rapid Response Event Note   Reason for Call :  Concern for hypoxia on blood gas.  ABG drawn earlier(2214); 7.49/36/47/27.3, O2 sat-78.8.  Per RN, pt was on 3L [place], in no distress, and SpO2-100% when ABG was drawn.  Pt was then subsequently placed on a NRB d/t PaO2 and O2 sat on ABG.   Initial Focused Assessment:  Pt lying in bed with eyes closed, in no visible distress. Pt will arouse to verbal command. Once aroused, pt is anxious/confused, will not follow commands or answer questions, and will move all extremities. Lungs diminished t/o. Skin warm and dry.  HR-88, BP-127/44, RR-19, SpO2-100% on NRB and 100% on 3L [place] as well.   ABG drawn by RT. Sample was very dark and slow to fill in syringe.   Interventions:  ABG: 7.4/44/57/27.3/O2 sat-88.9% NRB>[place] Plan of Care:  Blood gases that were drawn at 2214 and 0308 were very likely venous blood gases. Pt SpO2 at the time of blood draw was 100% with a good waveform(correlating with HR), pt in no distress, and samples were very dark. Keep pt on 3L [place](this is his baseline home O2). Continue to monitor pt closely. Call RRT if further assistance needed.   Event Summary:   MD Notified: Dr. Howie Ill Call [phone] Arrival [phone] End [id number]  Dillard Essex, RN

## 2022-04-30 NOTE — Progress Notes (Signed)
Subjective:   Hospital day: 2  Overnight event:Patient become hypotensive after HD last night. He also had a hypoxia on overnight blood gas which was thought to be venous. CTA was negative for PE.   Interim History: Evaluated at the bedside laying comfortably in bed. More awake this am. Still unable to answer orientation questions. States he is in pain.   Objective:  Vital signs in last 24 hours: Vitals:   04/30/22 0321 04/30/22 0343 04/30/22 0400 04/30/22 0800  BP:  (!) 127/44 (!) 129/46 (!) 137/53  Pulse:  85 90 91  Resp:  20 20 (!) 25  Temp:  97.8 F (36.6 C)    TempSrc:  Oral    SpO2: 98% 99%  93%  Weight:      Height:        Filed Weights   04/29/22 0011 04/29/22 1258 04/29/22 1656  Weight: 53.4 kg 51 kg 49.5 kg     Intake/Output Summary (Last 24 hours) at 04/30/2022 1334 Last data filed at 04/29/2022 1656 Gross per 24 hour  Intake --  Output 648 ml  Net -648 ml   Net IO Since Admission: -8 mL [04/30/22 1334]  No results for input(s): GLUCAP in the last 72 hours.   Pertinent Labs:    Latest Ref Rng & Units 04/30/2022    2:52 AM 04/29/2022   10:55 PM 04/29/2022    2:11 AM  CBC  WBC 4.0 - 10.5 K/uL 4.0   4.1   6.5    Hemoglobin 13.0 - 17.0 g/dL 12.3   12.4   13.2    Hematocrit 39.0 - 52.0 % 38.7   40.0   43.2    Platelets 150 - 400 K/uL 94   99   121         Latest Ref Rng & Units 04/30/2022    2:52 AM 04/29/2022   10:55 PM 04/29/2022    2:11 AM  CMP  Glucose 70 - 99 mg/dL 72   74   80    BUN 8 - 23 mg/dL '20   18   28    '$ Creatinine 0.61 - 1.24 mg/dL 3.40   3.07   4.67    Sodium 135 - 145 mmol/L 133   138   137    Potassium 3.5 - 5.1 mmol/L 4.1   3.9   5.6    Chloride 98 - 111 mmol/L 98   101   98    CO2 22 - 32 mmol/L '23   23   23    '$ Calcium 8.9 - 10.3 mg/dL 7.5   7.5   7.5      Imaging: CT Angio Chest Pulmonary Embolism (PE) W or WO Contrast  Result Date: 04/30/2022 CLINICAL DATA:  Hypoxia EXAM: CT ANGIOGRAPHY CHEST WITH CONTRAST TECHNIQUE:  Multidetector CT imaging of the chest was performed using the standard protocol during bolus administration of intravenous contrast. Multiplanar CT image reconstructions and MIPs were obtained to evaluate the vascular anatomy. RADIATION DOSE REDUCTION: This exam was performed according to the departmental dose-optimization program which includes automated exposure control, adjustment of the mA and/or kV according to patient size and/or use of iterative reconstruction technique. CONTRAST:  65m OMNIPAQUE IOHEXOL 350 MG/ML SOLN COMPARISON:  Chest radiographs dated 04/28/2022 FINDINGS: Cardiovascular: Satisfactory opacification the bilateral pulmonary arteries to the segmental level. No evidence pulmonary embolism. Study is not tailored for evaluation of the thoracic aorta. No evidence of thoracic aortic aneurysm. Atherosclerotic calcifications of the  arch. Mild cardiomegaly.  No pericardial effusion. Three vessel coronary sclerosis. Mediastinum/Nodes: No suspicious mediastinal lymphadenopathy. Visualized thyroid is unremarkable. Lungs/Pleura: No frank interstitial edema. No suspicious pulmonary nodules. Mild lingular and left lower lobe atelectasis. Trace bilateral pleural effusions, left greater than right. No pneumothorax. Upper Abdomen: Visualized upper abdomen is notable for postsurgical changes in the liver, calcified splenic granulomata, extensive colonic diverticulosis, and vascular calcifications. Musculoskeletal: Median sternotomy. Mild to moderate compression fracture deformities at T9 and T12, likely chronic. Review of the MIP images confirms the above findings. IMPRESSION: No evidence of pulmonary embolism. Trace bilateral pleural effusions, left greater than right. No frank interstitial edema. Mild to moderate compression fracture deformities at T9 and T12, likely chronic. Aortic Atherosclerosis (ICD10-I70.0). Electronically Signed   By: Julian Hy M.D.   On: 04/30/2022 01:13    Physical  Exam  General: Chronically ill elderly man laying in bed. NAD CV: RRR. No m/r/g. No LE edema Pulmonary: Poor air movement. Anterior lung fields CTAB.  Abdominal: Soft, nontender, nondistended. Normal bowel sounds. Extremities: 2+ distal pulses. Normal ROM. Skin: Thin, warm and dry. Multiple bruises on extremities. Pressure ulcer on R anterior shoulder.  Neuro: More awake. Disoriented. Only respond with one word answers. Moves all extremities.  Psych: Normal mood and affect   Assessment/Plan: Tyler Mueller is a 81 y.o. 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  Principal Problem:   Sepsis ([place]) Active Problems:   Lung transplant status ([place])   Immunocompromised patient ([place])   ESRD on dialysis ([place])  Sepsis with unknown source Acute encephalopathy No fevers in the last 24 hours. No white count however patient is on immunosuppressive medications. Mental status not significantly improve today. All toxic and metabolic work-up have been unrevealing.  MRI brain did not show any structural cause of patient's altered mental status. Still unclear source of patient's encephalopathy. An underlying infection remains high on the differential. Pro-calcitonin significantly elevated, viral panel negative. ID consulted today and recommend attempting transfer to his transplant team at [facility]. -ID consulted, appreciate rec [facility] contacted for transfer, accepted at [facility]. They will make patient a priority and will try to get him at [facility] within the next 24 hours. Called wife but unable to reach to give update -Continue vancomycin and cefepime -Doxycycline started by ID today -Blood cultures still no growth after 2 days  ESRD on HD TThS Patient reported to have a hypotensive episode during HD yesterday.  Only 700 mL were removed due to  low BP and tachycardia. No HD today per nephro. Patient hemodynamically stable today on 3 L [place]. -Nephrology following, appreciate recs -Next HD Monday, off schedule  Acute hypoxic respiratory failure Hx of lung transplant in 2004 Hx of Nocardia pulmonary infection Patient had an episode of hypoxia on blood gas yesterday. SpO2 remained above 93% on 3 L.  CTA was negative for PE and showed trace bilateral pleural effusions and moderate compression fracture deformities at T9 and T12.  Patient remains stable from respiratory standpoint this morning.  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 No radiographic findings of osteomyelitis.   Hypertension Blood pressure remained stable with SBP in the 110s to 130s. - Continue home amlodipine 5  mg     Best Practice: Diet: Renal diet IVF: Fluids: none VTE: heparin injection 5,000 Units  Code: Full Code  Prior to Admission Living Arrangement: Home Anticipated Discharge Location: [facility] Barriers to Discharge: Medical stability Dispo: Anticipated discharge in the next 24 hours  Signed: Lacinda Axon, MD 04/30/2022, 1:34 PM  Pager: [phone] Internal Medicine Teaching Service After 5pm on weekdays and 1pm on weekends: On Call pager: [phone]

## 2022-04-30 NOTE — Progress Notes (Signed)
Subjective:  Were able to do HD yest-  only removed around 700 ccs due to low BP and tachycardia-  hemodynamically more stable this AM-  had panic hypoxia on venous blood gas which likely wasn't accurate-  CT did not show PE.  He is just moaning this AM  cannot tell me what is wrong-  had HCT- age related atrophy-  no sign of acute infarction   Objective Vital signs in last 24 hours: Vitals:   04/30/22 0321 04/30/22 0343 04/30/22 0400 04/30/22 0800  BP:  (!) 127/44 (!) 129/46 (!) 137/53  Pulse:  85 90 91  Resp:  20 20 (!) 25  Temp:  97.8 F (36.6 C)    TempSrc:  Oral    SpO2: 98% 99%  93%  Weight:      Height:       Weight change: 1.104 kg  Intake/Output Summary (Last 24 hours) at 04/30/2022 0900 Last data filed at 04/29/2022 1656 Gross per 24 hour  Intake 240 ml  Output 648 ml  Net -408 ml    Dialysis Orders: [facility] T,Th,S 3:45 hrs 160NRe 350/800 51 kg 2.0K/2.0 Ca AVF -No heparin -Hectorol 1 mcg IV TIW   Assessment/Plan:  SIRS: Work up per primary. WBC 6.5. He is immunosuppressed.   on cefepime/azithro and vanc-  blood cultures neg to date  Volume overload: Vascular congestion present on CXR. Has not been meeting EDW at [facility]. Hypertensive. HD 5/27 off schedule-  was only able to remove 700-  sat is 99-100 on 3 liters- not sure if he has a home O2 req AMS-CT/MRI of head without acute abnormality. Hold gabapentin. No sedating medications. BUN 28, not uremic. Per primary.  CT also does not show anything acute-  just atrophy related to age-  does not seem to be improving with abx and HD-  unclear etiology   Mild hyperkalemia: resolved with HD   ESRD -  T,Th,S. HD Friday Off schedule. No heparin. He does not need today -  next will likely be Monday off schedule  Hypertension/volume  - See #2. BP has been labile since admission. Seems OK this AM-  on amlodipine only .   Anemia  - HGB over 12 No OP ESA/Fe. Follow labs.   Metabolic bone disease -  FI+4.3 but OP PO4 higher than usual.  PO4 8.6 Ca+ 8.1 04/26/2022. Holding binders due to MS_ will need to add hect to treatment on Monday   Nutrition - Albumin 3.5 Renal/Carb mod diet. DMT2 -per primary H/O double lung transplant 2014. Continue immunosupressive Rx per primary-  prograf/pred H/O osteomyelitis R foot/S/P ray amputation: No evidence of osteo on xray. Per primary.   Tyler Mueller    Labs: Basic Metabolic Panel: Recent Labs  Lab 04/29/22 0211 04/29/22 2255 04/30/22 0252  NA 137 138 133*  K 5.6* 3.9 4.1  CL 98 101 98  CO2 '23 23 23  '$ GLUCOSE 80 74 72  BUN 28* 18 20  CREATININE 4.67* 3.07* 3.40*  CALCIUM 7.5* 7.5* 7.5*  PHOS  --   --  5.6*   Liver Function Tests: Recent Labs  Lab 04/28/22 1638 04/30/22 0252  AST 39  --   ALT 21  --   ALKPHOS 83  --   BILITOT 0.8  --   PROT 6.9  --   ALBUMIN 3.5 2.7*   No results for input(s): LIPASE, AMYLASE in the last 168 hours. No results for input(s): AMMONIA in the last 168 hours.  CBC: Recent Labs  Lab 04/28/22 1638 04/29/22 0211 04/29/22 2255 04/30/22 0252  WBC 5.6 6.5 4.1 4.0  NEUTROABS 3.8  --   --   --   HGB 13.9 13.2 12.4* 12.3*  HCT 46.2 43.2 40.0 38.7*  MCV 101.1* 98.2 96.9 96.8  PLT 123* 121* 99* 94*   Cardiac Enzymes: No results for input(s): CKTOTAL, CKMB, CKMBINDEX, TROPONINI in the last 168 hours. CBG: No results for input(s): GLUCAP in the last 168 hours.  Iron Studies: No results for input(s): IRON, TIBC, TRANSFERRIN, FERRITIN in the last 72 hours. Studies/Results: CT Head Wo Contrast  Result Date: 04/28/2022 CLINICAL DATA:  Mental status change of unknown cause. Recent diagnosis with sepsis. EXAM: CT HEAD WITHOUT CONTRAST TECHNIQUE: Contiguous axial images were obtained from the base of the skull through the vertex without intravenous contrast. RADIATION DOSE REDUCTION: This exam was performed according to the departmental dose-optimization program which includes automated exposure control, adjustment of the mA and/or kV  according to patient size and/or use of iterative reconstruction technique. COMPARISON:  11/26/2021 FINDINGS: Brain: Age related atrophy. Chronic small-vessel ischemic changes of the white matter. 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 No sign of acute infarction, mass lesion, hemorrhage, hydrocephalus or extra-axial collection. Vascular: There is atherosclerotic calcification of the major vessels at the base of the brain. Skull: Negative Sinuses/Orbits: Clear/normal Other: None IMPRESSION: No acute finding. Atrophy and chronic small-vessel ischemic changes. Electronically Signed   By: Nelson Chimes M.D.   On: 04/28/2022 18:53   CT Angio Chest Pulmonary Embolism (PE) W or WO Contrast  Result Date: 04/30/2022 CLINICAL DATA:  Hypoxia EXAM: CT ANGIOGRAPHY CHEST WITH CONTRAST TECHNIQUE: Multidetector CT imaging of the chest was performed using the standard protocol during bolus administration of intravenous contrast. Multiplanar CT image reconstructions and MIPs were obtained to evaluate the vascular anatomy. RADIATION DOSE REDUCTION: This exam was performed according to the departmental dose-optimization program which includes automated exposure control, adjustment of the mA and/or kV according to patient size and/or use of iterative reconstruction technique. CONTRAST:  70m OMNIPAQUE IOHEXOL 350 MG/ML SOLN COMPARISON:  Chest radiographs dated 04/28/2022 FINDINGS: Cardiovascular: Satisfactory opacification the bilateral pulmonary arteries to the segmental level. No evidence pulmonary embolism. Study is not tailored for evaluation of the thoracic aorta. No evidence of thoracic aortic aneurysm. Atherosclerotic calcifications of the arch. Mild cardiomegaly.  No pericardial effusion. Three vessel coronary sclerosis. Mediastinum/Nodes: No suspicious mediastinal lymphadenopathy. Visualized thyroid is unremarkable. Lungs/Pleura: No frank interstitial edema. No suspicious pulmonary nodules. Mild lingular and left lower lobe  atelectasis. Trace bilateral pleural effusions, left greater than right. No pneumothorax. Upper Abdomen: Visualized upper abdomen is notable for postsurgical changes in the liver, calcified splenic granulomata, extensive colonic diverticulosis, and vascular calcifications. Musculoskeletal: Median sternotomy. Mild to moderate compression fracture deformities at T9 and T12, likely chronic. Review of the MIP images confirms the above findings. IMPRESSION: No evidence of pulmonary embolism. Trace bilateral pleural effusions, left greater than right. No frank interstitial edema. Mild to moderate compression fracture deformities at T9 and T12, likely chronic. Aortic Atherosclerosis (ICD10-I70.0). Electronically Signed   By: SJulian HyM.D.   On: 04/30/2022 01:13   MR BRAIN WO CONTRAST  Result Date: 04/29/2022 CLINICAL DATA:  Provided history: Mental status change, unknown cause. Fever, altered mental status. EXAM: MRI HEAD WITHOUT CONTRAST TECHNIQUE: Multiplanar, multiecho pulse sequences of the brain and surrounding structures were obtained without intravenous contrast. COMPARISON:  Prior head CT examinations 04/28/2022 and earlier. Brain MRI 06/10/2021. FINDINGS: The examination is  intermittently motion degraded, limiting evaluation. Most notably, there is severe motion degradation of the sagittal T1 weighted sequence, moderate motion degradation of the axial diffusion-weighted sequence, mild-to-moderate motion degradation of the coronal diffusion-weighted sequence, moderate motion degradation of the axial T2 FLAIR sequence, and severe motion degradation of the axial SWI sequence. Due to altered mental status and excessive patient motion, the examination was terminated prior to the acquisition of axial T1 weighted and coronal T2 TSE sequences. Brain: Mild generalized cerebral atrophy. Advanced patchy and confluent T2 FLAIR hyperintense signal abnormality within the cerebral white matter, nonspecific but  compatible with chronic small vessel ischemic disease. 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 There is no evidence of acute infarct. No appreciable intracranial mass or extra-axial fluid collection. Severe motion degradation of the SWI sequence precludes evaluation for intracranial blood products. No midline shift. Vascular: Maintained flow voids within the proximal large arterial vessels. Skull and upper cervical spine: No focal suspicious marrow lesion. Sinuses/Orbits: No mass or acute finding within the imaged orbits. Minimal mucosal thickening within the bilateral ethmoid and left maxillary sinuses. Other: Small-volume fluid within the left mastoid air cells. IMPRESSION: 1. Prematurely terminated and significantly motion degraded examination, as described. 2. No evidence of acute intracranial abnormality. 3. Advanced chronic small vessel ischemic changes within the cerebral white matter, similar to the prior brain MRI of 06/10/2021. 4. 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 5. Mild for age generalized cerebral atrophy. 6. Small-volume fluid within the left mastoid air cells. Electronically Signed   By: Tayten Bergdoll Simmering D.O.   On: 04/29/2022 10:23   DG Chest Port 1 View  Result Date: 04/28/2022 CLINICAL DATA:  Questionable sepsis. EXAM: PORTABLE CHEST 1 VIEW COMPARISON:  Chest x-ray 04/28/2022. FINDINGS: Patient is status post cardiac surgery as seen on the prior study. There is stable enlargement of the cardiomediastinal silhouette. There is increasing central pulmonary vascular congestion. There is a new small left pleural effusion. No pneumothorax or focal lung consolidation. No acute fractures are seen. IMPRESSION: 1. Cardiomegaly with increasing pulmonary vascular congestion and new small left pleural effusion. Electronically Signed   By: Ronney Asters M.D.   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 4th distal metacarpal shaft fracture, healing, unchanged. Lucency along the forefoot (in the region of prior 3rd metatarsal resection) may correspond to the patient's known soft tissue wound. Wound is poorly visualized but appears superficial on the lateral view. No definite cortical destruction to suggest osteomyelitis radiographically, noting limited evaluation. No fracture is seen. Extensive vascular calcifications. IMPRESSION: No radiographic findings of osteomyelitis. 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 4th distal metacarpal shaft fracture, healing, unchanged. Electronically Signed   By: Julian Hy M.D.   On: 04/28/2022 17:56   Medications: Infusions:  ceFEPime (MAXIPIME) IV 1 g (04/29/22 1927)    Scheduled Medications:  amLODipine  5 mg Oral Daily   azithromycin  250 mg Oral Q M,W,F   Chlorhexidine Gluconate Cloth  6 each Topical Q0600   heparin  5,000 Units Subcutaneous Q8H   predniSONE  5 mg Oral Daily   sulfamethoxazole-trimethoprim  1 tablet Oral QPM   tacrolimus  0.5 mg Oral BID  vancomycin variable dose per unstable renal function (pharmacist dosing)   Does not apply See admin instructions    have reviewed scheduled and prn medications.  Physical Exam: General: thin frail WM-  moaning but will not answer questions Heart: RRR Lungs: poor effort-  Abdomen:soft, non tender Extremities: min edema Dialysis Access: arm very bruised-  AVF patent    04/30/2022,9:00 AM  LOS: 2 days

## 2022-04-30 NOTE — Progress Notes (Signed)
Patient has been receiving HD for greater than 6 months.  He is currently hypoxic with concerns for possible PE.  We will continue with IV contrast for CT PE study at this time.

## 2022-05-01 DIAGNOSIS — R5381 Other malaise: Secondary | ICD-10-CM | POA: Insufficient documentation

## 2022-05-01 LAB — PROCALCITONIN: Procalcitonin: 4.54 ng/mL

## 2022-05-01 LAB — RPR: RPR Ser Ql: NONREACTIVE

## 2022-05-01 MED ORDER — SODIUM CHLORIDE 0.9 % IV SOLN: 1.0000 g | INTRAVENOUS | Status: DC

## 2022-05-01 NOTE — Discharge Summary (Signed)
Name: Tyler Mueller MRN: [id number] DOB: 04-13-1941 81 y.o. PCP: Mikey College, MD  Date of Admission: 04/28/2022  3:59 PM Date of Discharge: 05/01/2022 05/01/22 Attending Physician: Dr. Dareen Piano  DISCHARGE DIAGNOSIS:  Primary Problem: Sepsis of unknown origin in immunosuppressed patient with hx of double lung transplant.   Hospital Problems: Principal Problem:   Sepsis [facility]) Active Problems:   Lung transplant status ([place])   Immunocompromised patient ([place])   ESRD on dialysis ([place])    DISCHARGE MEDICATIONS:   Allergies as of 05/01/2022       Reactions   Lorazepam Other (See Comments)   delirium   Morphine And Related Nausea And Vomiting, Other (See Comments)   Hallucinations    Nsaids Other (See Comments)    Kidney disorder   Penicillins Nausea And Vomiting, Other (See Comments)   Hallucinations    Amphetamine-dextroamphetamine Anxiety, Other (See Comments)   confusion        Medication List     STOP taking these medications    azithromycin 250 MG tablet Commonly known as: ZITHROMAX   oxyCODONE-acetaminophen 5-325 MG tablet Commonly known as: PERCOCET/ROXICET       TAKE these medications    acetaminophen 325 MG tablet Commonly known as: TYLENOL Take 650 mg by mouth every 6 (six) hours as needed for moderate pain.   acyclovir 200 MG capsule Commonly known as: ZOVIRAX Take 4 capsules (800 mg total) by mouth daily.   albuterol (2.5 MG/3ML) 0.083% nebulizer solution Commonly known as: PROVENTIL Take 2.5 mg by nebulization every 6 (six) hours as needed for wheezing or shortness of breath.   amLODipine 5 MG tablet Commonly known as: NORVASC Take 5 mg by mouth daily. Hold for systolic bp less than 287.   aspirin EC 81 MG tablet Take 81 mg by mouth daily.   BEANO PO Take 1 tablet by mouth daily as needed (gas/bloating).   beta carotene w/minerals tablet Take 1 tablet by mouth daily.   CALCIUM 600 + D PO Take 1 tablet by mouth daily with  lunch.   carvedilol 25 MG tablet Commonly known as: COREG Take 25 mg by mouth daily. Take 1 tablet (25 mg) by mouth in the evening on Tuesdays, Thursdays & Saturdays ( dialysis days)   ceFEPIme 1 g in sodium chloride 0.9 % 100 mL Inject 1 g into the vein daily.   doxycycline 100 MG tablet Commonly known as: VIBRA-TABS Take 1 tablet (100 mg total) by mouth 2 (two) times daily.   famotidine 20 MG tablet Commonly known as: PEPCID Take 20 mg by mouth in the morning.   Ferrous Gluconate 324 (37.5 Fe) MG Tabs Take 324 mg by mouth every evening.   gabapentin 300 MG capsule Commonly known as: NEURONTIN Take 300 mg by mouth at bedtime.   lanthanum 500 MG chewable tablet Commonly known as: FOSRENOL Chew 3,000 mg by mouth 2 (two) times daily.   lidocaine-prilocaine cream Commonly known as: EMLA SMARTSIG:sparingly Topical   midodrine 5 MG tablet Commonly known as: PROAMATINE Take 5 mg by mouth See admin instructions. Takes 1 tablet by mouth only on dialysis days if systolic blood pressure less than 150.   multivitamin with minerals Tabs tablet Take 1 tablet by mouth daily.   NOVOLOG FLEXPEN [place] Inject 0-5 Units into the skin See admin instructions. 3 times daily per sliding scales < = 0 units 201-250=1 unit 251-300 = 2 units 301-350=  units 351-400= 4 units >400 5 units   potassium chloride  SA 20 MEQ tablet Commonly known as: KLOR-CON M Take 1 tablet (20 mEq total) by mouth daily.   pravastatin 40 MG tablet Commonly known as: PRAVACHOL Take 20 mg by mouth every evening.   predniSONE 5 MG tablet Commonly known as: DELTASONE Take 5 mg by mouth daily.   senna 8.6 MG Tabs tablet Commonly known as: SENOKOT Take 1 tablet by mouth daily as needed for mild constipation.   sulfamethoxazole-trimethoprim 400-80 MG tablet Commonly known as: BACTRIM Take 1 tablet by mouth every evening. continuous   tacrolimus 0.5 MG capsule Commonly known as: PROGRAF Take 0.5 mg by mouth  2 (two) times daily.   traMADol 50 MG tablet Commonly known as: ULTRAM Take 50 mg by mouth 2 (two) times daily as needed for severe pain.       Also continue: Vancomycin IV 500 mg  Doxycycline IV 100 mg q12h for possible [facility] Spotted Fever  Cefepime as listed above   DISPOSITION   [name] was transferred from [facility] in stable condition to [place]:  Patient Summary: Sepsis with unknown source Acute encephalopathy Patient was initially admitted to the hospital with fever, tachycardia, and altered mental status likely secondary to sepsis from an underlying infection. He was started on broad spectrum abx with vanc and cefepime. The etiology of his underlying infection remained uncertain during hospitalization. Blood cultures with NGTD. MRSA swab negative. Source unclear, however respiratory infection high on ddx for this elderly immunosuppressed man with hypoxia on arrival, new pleural effusion on imaging, lung exam with poor air movement, and procalcitonin persistently elevated >3. Soft tissue infection also on ddx given multiple skin lesions. Intracranial pathology ruled out with CT and MRI. XR negative for osteomyelitis. No concern for UTI given he is anuric. B12 and TSH wnl. HIV and RPR pending. CTA chest was negative for PE and showed no evidence of underlying infection. 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 ID consulted this admission; recommending continuing cefepime and starting doxycycline to cover for possible tickborne illness. Azithromycin was discontinued. If patient continues to remain altered would consider LP. Overall, given history of lung transplant and immunosuppressed (on tacrolimus and prednisone chronically) state, and patient altered  despite being on broad-spectrum antibiotics for 24 hours, felt that it was appropriate to transfer pt to [facility] for further workup and treatment. I am extremely concerned given his lack of improvement. He was transported to [facility] via [place] at 8 am on 05/01/22 for ongoing workup and care.  ESRD on HD [facility] Nephrology was following, and assisting with HD as tolerated. Did have hypotensive episodes with HD.    Acute hypoxic respiratory failure Hx of lung transplant in 2004 Hx of Nocardia pulmonary infection Patient had an episode of hypoxia on blood gas on 5/27. SpO2 remained above 93% on 3 L.  CTA was negative for PE and showed trace bilateral pleural effusions and moderate compression fracture deformities at T9 and T12.  Patient remains stable from respiratory standpoint from this point until transfer to [facility].  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 No radiographic findings of osteomyelitis. Can consider MRI of the foot for further evaluation.  Hypertension Blood pressure remained stable with SBP in the 110s to 130s. Did have hypotensive episodes with HD.  - Continued home amlodipine 5 mg      DISCHARGE INSTRUCTIONS:   Discharge Instructions     Call MD for:  difficulty breathing, headache or visual disturbances   Complete by: As directed    Call MD for:  extreme fatigue   Complete by: As directed    Call MD for:  hives   Complete by: As directed    Call MD for:  persistant dizziness or light-headedness   Complete by: As directed    Call MD for:  persistant nausea and vomiting   Complete by: As directed    Call MD for:  redness, tenderness, or signs of infection (pain, swelling, redness, odor or green/yellow discharge around incision site)   Complete by: As directed    Call MD for:  severe uncontrolled pain   Complete by: As  directed    Call MD for:  temperature >100.4   Complete by: As directed    Diet - low sodium heart healthy   Complete by: As directed    Increase activity slowly   Complete by: As directed        SUBJECTIVE:  No acute overnight events.   Initiated transfer request to [facility] on 05/01/22. Approved on 05/01/22. Pt was stable for transfer at that time, pending [facility] transportation. He was transferred around 8 am prior to rounds. [facility] to accept patient at this time, and resume care. Accepting physician is Lucilla Edin, MD.   Discharge Vitals:   BP 127/72 (BP Location: Right Leg)   Pulse 89   Temp 97.6 F (36.4 C) (Axillary)   Resp 17   Ht '5\' 8"'$  (1.727 m)   Wt 49.5 kg   SpO2 97%   BMI 16.59 kg/m   OBJECTIVE:  Last physical exam, 04/30/22: General: Chronically ill elderly man laying in bed. NAD CV: RRR. No m/r/g. No LE edema Pulmonary: Poor air movement. Anterior lung fields CTAB.  Abdominal: Soft, nontender, nondistended. Normal bowel sounds. Extremities: 2+ distal pulses. Normal ROM. Skin: Thin, warm and dry. Multiple bruises on extremities. Pressure ulcer on R anterior shoulder.  Neuro: More awake. Disoriented. Only respond with one word answers. Moves all extremities.  Psych: Normal mood and affect  Pertinent Labs, Studies, and Procedures:     Latest Ref Rng & Units 04/30/2022    2:52 AM 04/29/2022   10:55 PM 04/29/2022    2:11 AM  CBC  WBC 4.0 - 10.5 K/uL 4.0   4.1   6.5    Hemoglobin 13.0 - 17.0 g/dL 12.3   12.4   13.2    Hematocrit 39.0 - 52.0 % 38.7   40.0   43.2    Platelets 150 - 400 K/uL 94   99   121         Latest Ref Rng & Units 04/30/2022    2:52 AM 04/29/2022   10:55 PM 04/29/2022    2:11 AM  CMP  Glucose 70 - 99 mg/dL 72   74   80    BUN 8 - 23 mg/dL '20   18   28    '$ Creatinine 0.61 - 1.24 mg/dL 3.40   3.07   4.67    Sodium 135 - 145 mmol/L 133   138   137    Potassium 3.5 - 5.1 mmol/L 4.1   3.9   5.6  Chloride 98 - 111 mmol/L  98   101   98    CO2 22 - 32 mmol/L '23   23   23    '$ Calcium 8.9 - 10.3 mg/dL 7.5   7.5   7.5      CT Head Wo Contrast  Result Date: 04/28/2022 CLINICAL DATA:  Mental status change of unknown cause. Recent diagnosis with sepsis. EXAM: CT HEAD WITHOUT CONTRAST TECHNIQUE: Contiguous axial images were obtained from the base of the skull through the vertex without intravenous contrast. RADIATION DOSE REDUCTION: This exam was performed according to the departmental dose-optimization program which includes automated exposure control, adjustment of the mA and/or kV according to patient size and/or use of iterative reconstruction technique. COMPARISON:  11/26/2021 FINDINGS: Brain: Age related atrophy. Chronic small-vessel ischemic changes of the white matter. 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 No sign of acute infarction, mass lesion, hemorrhage, hydrocephalus or extra-axial collection. Vascular: There is atherosclerotic calcification of the major vessels at the base of the brain. Skull: Negative Sinuses/Orbits: Clear/normal Other: None IMPRESSION: No acute finding. Atrophy and chronic small-vessel ischemic changes. Electronically Signed   By: Nelson Chimes M.D.   On: 04/28/2022 18:53   MR BRAIN WO CONTRAST  Result Date: 04/29/2022 CLINICAL DATA:  Provided history: Mental status change, unknown cause. Fever, altered mental status. EXAM: MRI HEAD WITHOUT CONTRAST TECHNIQUE: Multiplanar, multiecho pulse sequences of the brain and surrounding structures were obtained without intravenous contrast. COMPARISON:  Prior head CT examinations 04/28/2022 and earlier. Brain MRI 06/10/2021. FINDINGS: The examination is intermittently motion degraded, limiting evaluation. Most notably, there is severe motion degradation of the sagittal T1 weighted sequence, moderate motion degradation of the axial diffusion-weighted sequence, mild-to-moderate motion degradation of the coronal diffusion-weighted sequence, moderate motion  degradation of the axial T2 FLAIR sequence, and severe motion degradation of the axial SWI sequence. Due to altered mental status and excessive patient motion, the examination was terminated prior to the acquisition of axial T1 weighted and coronal T2 TSE sequences. Brain: Mild generalized cerebral atrophy. Advanced patchy and confluent T2 FLAIR hyperintense signal abnormality within the cerebral white matter, nonspecific but compatible with chronic small vessel ischemic disease. 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 There is no evidence of acute infarct. No appreciable intracranial mass or extra-axial fluid collection. Severe motion degradation of the SWI sequence precludes evaluation for intracranial blood products. No midline shift. Vascular: Maintained flow voids within the proximal large arterial vessels. Skull and upper cervical spine: No focal suspicious marrow lesion. Sinuses/Orbits: No mass or acute finding within the imaged orbits. Minimal mucosal thickening within the bilateral ethmoid and left maxillary sinuses. Other: Small-volume fluid within the left mastoid air cells. IMPRESSION: 1. Prematurely terminated and significantly motion degraded examination, as described. 2. No evidence of acute intracranial abnormality. 3. Advanced chronic small vessel ischemic changes within the cerebral white matter, similar to the prior brain MRI of 06/10/2021. 4. 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 5. Mild for age generalized cerebral atrophy. 6. Small-volume fluid within the left mastoid air cells. Electronically Signed   By: Kellie Simmering D.O.   On: 04/29/2022 10:23   DG Chest Port 1 View  Result Date: 04/28/2022 CLINICAL DATA:  Questionable sepsis. EXAM: PORTABLE CHEST 1 VIEW COMPARISON:  Chest x-ray 04/28/2022. FINDINGS: Patient is status post cardiac surgery as seen  on the prior study. There is stable enlargement of the cardiomediastinal silhouette. There is  increasing central pulmonary vascular congestion. There is a new small left pleural effusion. No pneumothorax or focal lung consolidation. No acute fractures are seen. IMPRESSION: 1. Cardiomegaly with increasing pulmonary vascular congestion and new small left pleural effusion. Electronically Signed   By: Ronney Asters M.D.   On: 04/28/2022 17:11   DG Foot Complete Right  Result Date: 04/28/2022 CLINICAL DATA:  Sepsis EXAM: RIGHT FOOT COMPLETE - 3+ VIEW COMPARISON:  Outside hospital [facility]) radiographs dated 04/20/2022 FINDINGS: 3rd digit amputation at the level of the mid metatarsal shaft. 4th distal metacarpal shaft fracture, healing, unchanged. Lucency along the forefoot (in the region of prior 3rd metatarsal resection) may correspond to the patient's known soft tissue wound. Wound is poorly visualized but appears superficial on the lateral view. No definite cortical destruction to suggest osteomyelitis radiographically, noting limited evaluation. No fracture is seen. Extensive vascular calcifications. IMPRESSION: No radiographic findings of osteomyelitis. 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 4th distal metacarpal shaft fracture, healing, unchanged. Electronically Signed   By: Julian Hy M.D.   On: 04/28/2022 17:56      Lajean Manes, MD Internal Medicine Resident, PGY-1 Pager: [phone]

## 2022-05-01 NOTE — Discharge Summary (Deleted)
Name: Tyler Mueller MRN: [id number] DOB: August 15, 1941 81 y.o. PCP: Tyler College, MD  Date of Admission: 04/28/2022  3:59 PM Date of Discharge:  05/01/22 Attending Physician: Dr. Dareen Piano  DISCHARGE DIAGNOSIS:  Primary Problem: Sepsis of unknown origin in immunosuppressed patient with hx of double lung transplant.   Hospital Problems: Principal Problem:   Sepsis [facility]) Active Problems:   Lung transplant status ([place])   Immunocompromised patient ([place])   ESRD on dialysis ([place])    DISCHARGE MEDICATIONS:   Allergies as of 05/01/2022       Reactions   Lorazepam Other (See Comments)   delirium   Morphine And Related Nausea And Vomiting, Other (See Comments)   Hallucinations    Nsaids Other (See Comments)    Kidney disorder   Penicillins Nausea And Vomiting, Other (See Comments)   Hallucinations    Amphetamine-dextroamphetamine Anxiety, Other (See Comments)   confusion        Medication List     STOP taking these medications    oxyCODONE-acetaminophen 5-325 MG tablet Commonly known as: PERCOCET/ROXICET       TAKE these medications    acetaminophen 325 MG tablet Commonly known as: TYLENOL Take 650 mg by mouth every 6 (six) hours as needed for moderate pain.   acyclovir 200 MG capsule Commonly known as: ZOVIRAX Take 4 capsules (800 mg total) by mouth daily.   albuterol (2.5 MG/3ML) 0.083% nebulizer solution Commonly known as: PROVENTIL Take 2.5 mg by nebulization every 6 (six) hours as needed for wheezing or shortness of breath.   amLODipine 5 MG tablet Commonly known as: NORVASC Take 5 mg by mouth daily. Hold for systolic bp less than 417.   aspirin EC 81 MG tablet Take 81 mg by mouth daily.   azithromycin 250 MG tablet Commonly known as: ZITHROMAX MAY ADMINISTER WITHOUT REGARD TO MEALS What changed:  how much to take how to take this when to take this additional instructions   BEANO PO Take 1 tablet by mouth daily as needed (gas/bloating).    beta carotene w/minerals tablet Take 1 tablet by mouth daily.   CALCIUM 600 + D PO Take 1 tablet by mouth daily with lunch.   carvedilol 25 MG tablet Commonly known as: COREG Take 25 mg by mouth daily. Take 1 tablet (25 mg) by mouth in the evening on Tuesdays, Thursdays & Saturdays ( dialysis days)   ceFEPIme 1 g in sodium chloride 0.9 % 100 mL Inject 1 g into the vein daily.   doxycycline 100 MG tablet Commonly known as: VIBRA-TABS Take 1 tablet (100 mg total) by mouth 2 (two) times daily.   famotidine 20 MG tablet Commonly known as: PEPCID Take 20 mg by mouth in the morning.   Ferrous Gluconate 324 (37.5 Fe) MG Tabs Take 324 mg by mouth every evening.   gabapentin 300 MG capsule Commonly known as: NEURONTIN Take 300 mg by mouth at bedtime.   lanthanum 500 MG chewable tablet Commonly known as: FOSRENOL Chew 3,000 mg by mouth 2 (two) times daily.   lidocaine-prilocaine cream Commonly known as: EMLA SMARTSIG:sparingly Topical   midodrine 5 MG tablet Commonly known as: PROAMATINE Take 5 mg by mouth See admin instructions. Takes 1 tablet by mouth only on dialysis days if systolic blood pressure less than 150.   multivitamin with minerals Tabs tablet Take 1 tablet by mouth daily.   NOVOLOG FLEXPEN [place] Inject 0-5 Units into the skin See admin instructions. 3 times daily per sliding scales < =  0 units 201-250=1 unit 251-300 = 2 units 301-350=  units 351-400= 4 units >400 5 units   potassium chloride SA 20 MEQ tablet Commonly known as: KLOR-CON M Take 1 tablet (20 mEq total) by mouth daily.   pravastatin 40 MG tablet Commonly known as: PRAVACHOL Take 20 mg by mouth every evening.   predniSONE 5 MG tablet Commonly known as: DELTASONE Take 5 mg by mouth daily.   senna 8.6 MG Tabs tablet Commonly known as: SENOKOT Take 1 tablet by mouth daily as needed for mild constipation.   sulfamethoxazole-trimethoprim 400-80 MG tablet Commonly known as: BACTRIM Take 1  tablet by mouth every evening. continuous   tacrolimus 0.5 MG capsule Commonly known as: PROGRAF Take 0.5 mg by mouth 2 (two) times daily.   traMADol 50 MG tablet Commonly known as: ULTRAM Take 50 mg by mouth 2 (two) times daily as needed for severe pain.        DISPOSITION   Tyler Mueller was transferred from [facility] in stable condition to [place]:  Patient Summary: Sepsis with unknown source Acute encephalopathy Patient was initially admitted to the hospital with fever, tachycardia, and altered mental status likely secondary to sepsis from an underlying infection. He was started on broad spectrum abx with vanc and cefepime. The etiology of his underlying infection remained uncertain during hospitalization. Blood cultures with NGTD. MRSA swab negative. Source unclear, however respiratory infection high on ddx for this elderly immunosuppressed man with hypoxia on arrival, new pleural effusion on imaging, lung exam with poor air movement, and procalcitonin persistently elevated >3. Soft tissue infection also on ddx given multiple skin lesions. Intracranial pathology ruled out with CT and MRI. XR negative for osteomyelitis. No concern for UTI given he is anuric. B12 and TSH wnl. HIV and RPR pending. CTA chest was negative for PE and showed no evidence of underlying infection. 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 ID consulted this admission; recommending continuing cefepime and starting doxycycline to cover for possible tickborne illness. Azithromycin was discontinued. If patient continues to remain altered would consider LP. Overall, given history of lung transplant and immunosuppressed (on tacrolimus and prednisone chronically) state, and patient altered despite being  on broad-spectrum antibiotics for 24 hours, felt that it was appropriate to transfer pt to [facility] for further workup and treatment. I am extremely concerned given his lack of improvement. He was transported to [facility] via [place] at 8 am on 05/01/22 for ongoing workup and care.  ESRD on HD TThS Nephrology was following, and assisting with HD as tolerated. Did have hypotensive episodes with HD.    Acute hypoxic respiratory failure Hx of lung transplant in 2004 Hx of Nocardia pulmonary infection Patient had an episode of hypoxia on blood gas on 5/27. SpO2 remained above 93% on 3 L.  CTA was negative for PE and showed trace bilateral pleural effusions and moderate compression fracture deformities at T9 and T12.  Patient remains stable from respiratory standpoint from this point until transfer to [facility].  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 No radiographic findings of osteomyelitis. Can consider MRI of the foot for further evaluation.  Hypertension Blood pressure remained stable with SBP in the 110s to 130s. Did have hypotensive episodes with HD.  - Continued home amlodipine 5 mg     DISCHARGE INSTRUCTIONS:   Discharge Instructions     Call MD for:  difficulty breathing, headache or visual disturbances   Complete by: As directed    Call MD for:  extreme fatigue   Complete by: As directed    Call MD for:  hives   Complete by: As directed    Call MD for:  persistant dizziness or light-headedness   Complete by: As directed    Call MD for:  persistant nausea and vomiting   Complete by: As directed    Call MD for:  redness, tenderness, or signs of infection (pain, swelling, redness, odor or green/yellow discharge around incision site)   Complete by: As directed    Call MD for:  severe uncontrolled pain   Complete by: As directed    Call  MD for:  temperature >100.4   Complete by: As directed    Diet - low sodium heart healthy   Complete by: As directed    Increase activity slowly   Complete by: As directed        SUBJECTIVE:  No acute overnight events.   Initiated transfer request to [facility] on 05/01/22. Approved on 05/01/22. Pt was stable for transfer at that time, pending [facility] transportation. He was transferred around 8 am prior to rounds. [place] to accept patient at this time, and resume care. Accepting physician is Lucilla Edin, MD.   Discharge Vitals:   BP 127/72 (BP Location: Right Leg)   Pulse 89   Temp 97.6 F (36.4 C) (Axillary)   Resp 17   Ht '5\' 8"'$  (1.727 m)   Wt 49.5 kg   SpO2 97%   BMI 16.59 kg/m   OBJECTIVE:  Last physical exam, 04/30/22: General: Chronically ill elderly man laying in bed. NAD CV: RRR. No m/r/g. No LE edema Pulmonary: Poor air movement. Anterior lung fields CTAB.  Abdominal: Soft, nontender, nondistended. Normal bowel sounds. Extremities: 2+ distal pulses. Normal ROM. Skin: Thin, warm and dry. Multiple bruises on extremities. Pressure ulcer on R anterior shoulder.  Neuro: More awake. Disoriented. Only respond with one word answers. Moves all extremities.  Psych: Normal mood and affect  Pertinent Labs, Studies, and Procedures:     Latest Ref Rng & Units 04/30/2022    2:52 AM 04/29/2022   10:55 PM 04/29/2022    2:11 AM  CBC  WBC 4.0 - 10.5 K/uL 4.0   4.1   6.5    Hemoglobin 13.0 - 17.0 g/dL 12.3   12.4   13.2    Hematocrit 39.0 - 52.0 % 38.7   40.0   43.2    Platelets 150 - 400 K/uL 94   99   121         Latest Ref Rng & Units 04/30/2022    2:52 AM 04/29/2022   10:55 PM 04/29/2022    2:11 AM  CMP  Glucose 70 - 99 mg/dL 72   74   80    BUN 8 - 23 mg/dL '20   18   28    '$ Creatinine 0.61 - 1.24 mg/dL 3.40   3.07   4.67    Sodium 135 - 145 mmol/L 133   138   137    Potassium 3.5 - 5.1 mmol/L 4.1   3.9   5.6    Chloride  98 - 111 mmol/L 98   101   98     CO2 22 - 32 mmol/L '23   23   23    '$ Calcium 8.9 - 10.3 mg/dL 7.5   7.5   7.5      CT Head Wo Contrast  Result Date: 04/28/2022 CLINICAL DATA:  Mental status change of unknown cause. Recent diagnosis with sepsis. EXAM: CT HEAD WITHOUT CONTRAST TECHNIQUE: Contiguous axial images were obtained from the base of the skull through the vertex without intravenous contrast. RADIATION DOSE REDUCTION: This exam was performed according to the departmental dose-optimization program which includes automated exposure control, adjustment of the mA and/or kV according to patient size and/or use of iterative reconstruction technique. COMPARISON:  11/26/2021 FINDINGS: Brain: Age related atrophy. Chronic small-vessel ischemic changes of the white matter. 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 No sign of acute infarction, mass lesion, hemorrhage, hydrocephalus or extra-axial collection. Vascular: There is atherosclerotic calcification of the major vessels at the base of the brain. Skull: Negative Sinuses/Orbits: Clear/normal Other: None IMPRESSION: No acute finding. Atrophy and chronic small-vessel ischemic changes. Electronically Signed   By: Nelson Chimes M.D.   On: 04/28/2022 18:53   MR BRAIN WO CONTRAST  Result Date: 04/29/2022 CLINICAL DATA:  Provided history: Mental status change, unknown cause. Fever, altered mental status. EXAM: MRI HEAD WITHOUT CONTRAST TECHNIQUE: Multiplanar, multiecho pulse sequences of the brain and surrounding structures were obtained without intravenous contrast. COMPARISON:  Prior head CT examinations 04/28/2022 and earlier. Brain MRI 06/10/2021. FINDINGS: The examination is intermittently motion degraded, limiting evaluation. Most notably, there is severe motion degradation of the sagittal T1 weighted sequence, moderate motion degradation of the axial diffusion-weighted sequence, mild-to-moderate motion degradation of the coronal diffusion-weighted sequence, moderate motion degradation of the  axial T2 FLAIR sequence, and severe motion degradation of the axial SWI sequence. Due to altered mental status and excessive patient motion, the examination was terminated prior to the acquisition of axial T1 weighted and coronal T2 TSE sequences. Brain: Mild generalized cerebral atrophy. Advanced patchy and confluent T2 FLAIR hyperintense signal abnormality within the cerebral white matter, nonspecific but compatible with chronic small vessel ischemic disease. 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 There is no evidence of acute infarct. No appreciable intracranial mass or extra-axial fluid collection. Severe motion degradation of the SWI sequence precludes evaluation for intracranial blood products. No midline shift. Vascular: Maintained flow voids within the proximal large arterial vessels. Skull and upper cervical spine: No focal suspicious marrow lesion. Sinuses/Orbits: No mass or acute finding within the imaged orbits. Minimal mucosal thickening within the bilateral ethmoid and left maxillary sinuses. Other: Small-volume fluid within the left mastoid air cells. IMPRESSION: 1. Prematurely terminated and significantly motion degraded examination, as described. 2. No evidence of acute intracranial abnormality. 3. Advanced chronic small vessel ischemic changes within the cerebral white matter, similar to the prior brain MRI of 06/10/2021. 4. 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 5. Mild for age generalized cerebral atrophy. 6. Small-volume fluid within the left mastoid air cells. Electronically Signed   By: Kellie Simmering D.O.   On: 04/29/2022 10:23   DG Chest Port 1 View  Result Date: 04/28/2022 CLINICAL DATA:  Questionable sepsis. EXAM: PORTABLE CHEST 1 VIEW COMPARISON:  Chest x-ray 04/28/2022. FINDINGS: Patient is status post cardiac surgery as seen on the prior  study. There is stable enlargement of the cardiomediastinal silhouette. There is increasing  central pulmonary vascular congestion. There is a new small left pleural effusion. No pneumothorax or focal lung consolidation. No acute fractures are seen. IMPRESSION: 1. Cardiomegaly with increasing pulmonary vascular congestion and new small left pleural effusion. Electronically Signed   By: Ronney Asters M.D.   On: 04/28/2022 17:11   DG Foot Complete Right  Result Date: 04/28/2022 CLINICAL DATA:  Sepsis EXAM: RIGHT FOOT COMPLETE - 3+ VIEW COMPARISON:  Outside hospital [facility]) radiographs dated 04/20/2022 FINDINGS: 3rd digit amputation at the level of the mid metatarsal shaft. 4th distal metacarpal shaft fracture, healing, unchanged. Lucency along the forefoot (in the region of prior 3rd metatarsal resection) may correspond to the patient's known soft tissue wound. Wound is poorly visualized but appears superficial on the lateral view. No definite cortical destruction to suggest osteomyelitis radiographically, noting limited evaluation. No fracture is seen. Extensive vascular calcifications. IMPRESSION: No radiographic findings of osteomyelitis. 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 4th distal metacarpal shaft fracture, healing, unchanged. Electronically Signed   By: Julian Hy M.D.   On: 04/28/2022 17:56      Lajean Manes, MD Internal Medicine Resident, PGY-1 Pager: [phone]

## 2022-05-01 NOTE — Discharge Instructions (Signed)
You were admitted for altered mental status. We did several labs and tests which did not show improvement. We are transferring you to [facility] to be at the facility where you received your transplant for further care .

## 2022-05-01 NOTE — Progress Notes (Signed)
Report and appropriate paperwork given to [facility] transport. Spouse made aware of patients pending transfer to [facility].

## 2022-05-01 NOTE — Progress Notes (Signed)
Transport confirmed  with [facility]. To be picked up ASAP but no ETA given.

## 2022-05-02 LAB — HIV-1 RNA QUANT-NO REFLEX-BLD
HIV 1 RNA Quant: <20 copies/mL
LOG10 HIV-1 RNA: UNDETERMINED log10copy/mL

## 2022-05-03 LAB — ASPERGILLUS ANTIBODY BY IMMUNODIFF
Aspergillus flavus: NEGATIVE
Aspergillus fumigatus, IgG: NEGATIVE
Aspergillus niger: NEGATIVE

## 2022-05-03 LAB — CULTURE, BLOOD (ROUTINE X 2)
Culture: NO GROWTH
Culture: NO GROWTH
Special Requests: ADEQUATE
Special Requests: ADEQUATE

## 2022-05-04 LAB — MISC LABCORP TEST (SEND OUT): Labcorp test code: 284526

## 2022-05-06 LAB — VOLATILES,BLD-ACETONE,ETHANOL,ISOPROP,METHANOL
Acetone, blood: <0.01 g/dL (ref 0.000–0.010)
Ethanol, blood: <0.01 g/dL (ref 0.000–0.010)
Isopropanol, blood: <0.01 g/dL (ref 0.000–0.010)
Methanol, blood: <0.01 g/dL (ref 0.000–0.010)

## 2022-05-07 DIAGNOSIS — R4182 Altered mental status, unspecified: Secondary | ICD-10-CM | POA: Insufficient documentation

## 2022-05-09 ENCOUNTER — Encounter: Payer: Medicare Other | Admitting: Orthopedic Surgery

## 2022-05-11 ENCOUNTER — Telehealth: Payer: Self-pay | Admitting: Orthopedic Surgery

## 2022-05-11 NOTE — Telephone Encounter (Signed)
Can you see if they can come in at 4 pm on 6/13 ?

## 2022-05-11 NOTE — Telephone Encounter (Signed)
Yes pt scheduled!

## 2022-05-11 NOTE — Telephone Encounter (Signed)
Pt called and states that they can not make it to his appt. Can you call and r/s?

## 2022-05-12 ENCOUNTER — Encounter: Payer: Medicare Other | Admitting: Orthopedic Surgery

## 2022-05-13 DIAGNOSIS — Z7952 Long term (current) use of systemic steroids: Secondary | ICD-10-CM | POA: Insufficient documentation

## 2022-05-17 ENCOUNTER — Ambulatory Visit (INDEPENDENT_AMBULATORY_CARE_PROVIDER_SITE_OTHER): Payer: Medicare Other | Admitting: Orthopedic Surgery

## 2022-05-17 DIAGNOSIS — Z89421 Acquired absence of other right toe(s): Secondary | ICD-10-CM

## 2022-05-17 DIAGNOSIS — S98131A Complete traumatic amputation of one right lesser toe, initial encounter: Secondary | ICD-10-CM

## 2022-05-20 ENCOUNTER — Encounter: Payer: Self-pay | Admitting: Orthopedic Surgery

## 2022-05-20 NOTE — Progress Notes (Signed)
Office Visit Note   Patient: Tyler Mueller           Date of Birth: 1941-01-25           MRN: [id number] Visit Date: 05/17/2022              Requested by: Mikey College, MD [facility] [address] [place],  [place] [address] PCP: Mikey College, MD  Chief Complaint  Patient presents with   Right Foot - Routine Post Op    02/23/22 right 3rd ray amputation  Stress fx vs osteo 4th MTH       HPI: Patient is a 81 year old gentleman who presents for 2 separate issues.  #1 he is 3 months status post right foot third ray amputation.  #2 is status post a stress fracture of the fourth metatarsal.  Assessment & Plan: Visit Diagnoses:  1. Amputation of toe of right foot ([place])     Plan: Patient will increase his activities as tolerated work on heel stretching no restrictions  Follow-Up Instructions: Return if symptoms worsen or fail to improve.   Ortho Exam  Patient is alert, oriented, no adenopathy, well-dressed, normal affect, normal respiratory effort. Examination the incision is well-healed there is no redness no cellulitis no swelling.  Patient has good dorsiflexion of the ankle.  Imaging: No results found. No images are attached to the encounter.  Labs: Lab Results  Component Value Date   HGBA1C 5.8 (H) 02/23/2022   HGBA1C 4.9 11/27/2021   HGBA1C (H) 01/05/2011    6.8 (NOTE)                                                                       According to the ADA Clinical Practice Recommendations for 2011, when HbA1c is used as a screening test:   >=6.5%   Diagnostic of Diabetes Mellitus           (if abnormal result  is confirmed)  5.7-6.4%   Increased risk of developing Diabetes Mellitus  References:Diagnosis and Classification of Diabetes Mellitus,Diabetes ZDGU,4403,47(QQVZD 1):S62-S69 and Standards of Medical Care in         Diabetes - 2011,Diabetes Care,2011,34  (Suppl 1):S11-S61.   REPTSTATUS 05/03/2022 FINAL 04/28/2022   CULT  04/28/2022    NO GROWTH 5  DAYS Performed at [facility], [place] [address] [place],  [address]      Lab Results  Component Value Date   ALBUMIN 2.7 (L) 04/30/2022   ALBUMIN 3.5 04/28/2022   ALBUMIN 2.5 (L) 02/26/2022    Lab Results  Component Value Date   MG 2.0 04/30/2022   MG 2.9 (H) 11/26/2021   MG 2.1 06/10/2021   Lab Results  Component Value Date   VD25OH 34 05/09/2011    No results found for: "PREALBUMIN"    Latest Ref Rng & Units 04/30/2022    2:52 AM 04/29/2022   10:55 PM 04/29/2022    2:11 AM  CBC EXTENDED  WBC 4.0 - 10.5 K/uL 4.0  4.1  6.5   RBC 4.22 - 5.81 MIL/uL 4.00  4.13  4.40   Hemoglobin 13.0 - 17.0 g/dL 12.3  12.4  13.2   HCT 39.0 - 52.0 % 38.7  40.0  43.2   Platelets 150 - 400 K/uL 94  99  121      There is no height or weight on file to calculate BMI.  Orders:  No orders of the defined types were placed in this encounter.  No orders of the defined types were placed in this encounter.    Procedures: No procedures performed  Clinical Data: No additional findings.  ROS:  All other systems negative, except as noted in the HPI. Review of Systems  Objective: Vital Signs: There were no vitals taken for this visit.  Specialty Comments:  No specialty comments available.  PMFS History: Patient Active Problem List   Diagnosis Date Noted   Sepsis ([place]) 04/28/2022   Anemia 02/27/2022   Rigors 02/26/2022   ESRD on dialysis ([place]) 02/23/2022   Essential hypertension 02/23/2022   PVD (peripheral vascular disease) ([place]) s/p femoral-popliteal bypass surgery 02/23/2022   S/P femoral-popliteal bypass surgery 02/23/2022   Osteomyelitis of third toe of right foot ([place])    Immunocompromised patient ([place]) 11/29/2021   Hyperkalemia 11/26/2021   Clostridium difficile infection 04/27/2014   Herpes zoster 04/23/2014   Muscle spasm of back 04/21/2014   Lung transplant status ([place]) 04/21/2014   Diverticulitis 04/20/2014   CKD (chronic kidney disease) stage 5, GFR  less than 15 ml/min (H[facility]) 04/20/2014   Type 2 diabetes mellitus with peripheral neuropathy ([place]) 01/04/2008   DYSLIPIDEMIA 01/04/2008   IMMUNOCOMPROMISED 01/04/2008   BRONCHIECTASIS 01/04/2008   DYSPNEA ON EXERTION 97/67/3419   Complications of transplanted lung 12/10/2007   Past Medical History:  Diagnosis Date   Diabetes mellitus without complication ([place])    Diverticulitis    ESRD on hemodialysis ([place])    RENAL INSUFFICIENCY   History of lung transplant ([place]) 04/21/2014   Hypertension    Lung transplanted ([place]) 12/05/2002   BILATERAL    Muscle spasm of back 04/21/2014   Skin cancer of face     History reviewed. No pertinent family history.  Past Surgical History:  Procedure Laterality Date   ABDOMINAL AORTOGRAM W/LOWER EXTREMITY Right 02/22/2022   Procedure: ABDOMINAL AORTOGRAM W/LOWER EXTREMITY;  Surgeon: Serafina Mitchell, MD;  Location: [facility] [place] [facility];  Service: Cardiovascular;  Laterality: Right;   AMPUTATION TOE Right 02/23/2022   Procedure: AMPUTATION THIRD RIGHT TOE;  Surgeon: Serafina Mitchell, MD;  Location: [facility] OR;  Service: Vascular;  Laterality: Right;   APPENDECTOMY     APPLICATION OF WOUND VAC Right 02/23/2022   Procedure: APPLICATION OF WOUND VAC;  Surgeon: Serafina Mitchell, MD;  Location: [facility] OR;  Service: Vascular;  Laterality: Right;   CATARACT EXTRACTION     ENDARTERECTOMY FEMORAL Right 02/23/2022   Procedure: RIGHT FEMORAL ENDARTERECTOMY;  Surgeon: Serafina Mitchell, MD;  Location: [facility] OR;  Service: Vascular;  Laterality: Right;   FEMORAL-POPLITEAL BYPASS GRAFT Right 02/23/2022   Procedure: RIGHT FEMORAL-POPLITEAL ARTERY BYPASS;  Surgeon: Serafina Mitchell, MD;  Location: [facility] OR;  Service: Vascular;  Laterality: Right;   LUNG TRANSPLANT, DOUBLE  2004   PERIPHERAL VASCULAR BALLOON ANGIOPLASTY Right 02/22/2022   Procedure: PERIPHERAL VASCULAR BALLOON ANGIOPLASTY;  Surgeon: Serafina Mitchell, MD;  Location: [place] [facility];  Service: Cardiovascular;  Laterality:  Right;  SFA unable to cross lesion   Social History   Occupational History   Not on file  Tobacco Use   Smoking status: Former    Types: Cigarettes    Quit date: 04/22/2003    Years since quitting: 19.0   Smokeless tobacco: Never  Substance and Sexual Activity   Alcohol use: No    Comment: QUIT ALCOHOL 2004   Drug use: No   Sexual activity: Not on file

## 2022-06-20 ENCOUNTER — Ambulatory Visit (HOSPITAL_COMMUNITY)
Admission: RE | Admit: 2022-06-20 | Discharge: 2022-06-20 | Disposition: A | Discharge status: Home / Self Care | Payer: Medicare Other | Source: Ambulatory Visit | Attending: Surgery | Admitting: Surgery

## 2022-06-20 ENCOUNTER — Ambulatory Visit (INDEPENDENT_AMBULATORY_CARE_PROVIDER_SITE_OTHER)
Admission: RE | Admit: 2022-06-20 | Discharge: 2022-06-20 | Disposition: A | Discharge status: Home / Self Care | Payer: Medicare Other | Source: Ambulatory Visit | Attending: Surgery | Admitting: Surgery

## 2022-06-20 ENCOUNTER — Ambulatory Visit (INDEPENDENT_AMBULATORY_CARE_PROVIDER_SITE_OTHER): Payer: Medicare Other | Admitting: Physician Assistant

## 2022-06-20 VITALS — BP 157/67 | HR 80 | Temp 98.1°F | Resp 20 | Ht 68.0 in

## 2022-06-20 DIAGNOSIS — I7025 Atherosclerosis of native arteries of other extremities with ulceration: Secondary | ICD-10-CM

## 2022-06-20 DIAGNOSIS — E08621 Diabetes mellitus due to underlying condition with foot ulcer: Secondary | ICD-10-CM

## 2022-06-20 DIAGNOSIS — L97501 Non-pressure chronic ulcer of other part of unspecified foot limited to breakdown of skin: Secondary | ICD-10-CM | POA: Insufficient documentation

## 2022-06-20 DIAGNOSIS — C4492 Squamous cell carcinoma of skin, unspecified: Secondary | ICD-10-CM | POA: Insufficient documentation

## 2022-06-20 DIAGNOSIS — R1312 Dysphagia, oropharyngeal phase: Secondary | ICD-10-CM | POA: Insufficient documentation

## 2022-06-20 NOTE — Progress Notes (Signed)
[facility] [place] HISTORY AND PHYSICAL   History of Present Illness:  Patient is a 81 y.o. year old male who presents originally with  a third toe wound on the right.  He was scheduled for angiogram which showed  The superficial femoral artery is heavily calcified with patent throughout its course.  The popliteal artery is occluded with reconstitution of the below-knee popliteal artery.  The dominant runoff is anterior tibial artery which occludes at the ankle.   He then underwent Right common femoral and profundofemoral endarterectomy and Right femoral to below-knee popliteal artery bypass graft with 6 mm external ring propatent PTFE.  Dr. Sharol Given performed the toe amputation.  He is doing well without new complaints.  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    The patient suffers from diabetes.  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  He is on dialysis Tuesday Thursday Saturday.  He is medically managed on ASA, Statin and insulin for DM.     Past Medical History:  Diagnosis Date   Diabetes mellitus without complication ([place])    Diverticulitis    ESRD on hemodialysis ([place])    RENAL INSUFFICIENCY   History of lung transplant ([place]) 04/21/2014   Hypertension    Lung transplanted ([place]) 12/05/2002   BILATERAL    Muscle spasm of back 04/21/2014   Skin cancer of face     Past Surgical History:  Procedure Laterality Date   ABDOMINAL AORTOGRAM W/LOWER EXTREMITY Right 02/22/2022   Procedure: ABDOMINAL AORTOGRAM W/LOWER EXTREMITY;  Surgeon: Serafina Mitchell, MD;  Location: [facility];  Service: Cardiovascular;  Laterality: Right;   AMPUTATION TOE Right 02/23/2022   Procedure: AMPUTATION THIRD RIGHT TOE;  Surgeon: Serafina Mitchell, MD;  Location: [facility];  Service: Vascular;  Laterality: Right;   APPENDECTOMY     APPLICATION OF WOUND VAC Right 02/23/2022   Procedure:  APPLICATION OF WOUND VAC;  Surgeon: Serafina Mitchell, MD;  Location: [facility];  Service: Vascular;  Laterality: Right;   CATARACT EXTRACTION     ENDARTERECTOMY FEMORAL Right 02/23/2022   Procedure: RIGHT FEMORAL ENDARTERECTOMY;  Surgeon: Serafina Mitchell, MD;  Location: [facility];  Service: Vascular;  Laterality: Right;   FEMORAL-POPLITEAL BYPASS GRAFT Right 02/23/2022   Procedure: RIGHT FEMORAL-POPLITEAL ARTERY BYPASS;  Surgeon: Serafina Mitchell, MD;  Location: [facility];  Service: Vascular;  Laterality: Right;   LUNG TRANSPLANT, DOUBLE  2004   PERIPHERAL VASCULAR BALLOON ANGIOPLASTY Right 02/22/2022   Procedure: PERIPHERAL VASCULAR BALLOON ANGIOPLASTY;  Surgeon: Serafina Mitchell, MD;  Location: [facility];  Service: Cardiovascular;  Laterality: Right;  SFA unable to cross lesion    ROS:   General:  No weight loss, Fever, chills  HEENT: No recent headaches, no nasal bleeding, no visual changes, no sore throat  Neurologic: No dizziness, blackouts, seizures. No recent symptoms of stroke or mini- stroke. No recent episodes of slurred speech, or temporary blindness.  Cardiac: No recent episodes of chest pain/pressure, no shortness of breath at rest.  No shortness of breath with exertion.  Denies history of atrial fibrillation or irregular heartbeat  Vascular: No history of rest pain in feet.  No history of claudication.  No history of non-healing ulcer, No history of DVT   Pulmonary: No home oxygen, no productive cough, no hemoptysis,  No asthma or wheezing  Musculoskeletal:  '[ ]'$   Arthritis, '[ ]'$  Low back pain,  [x ] Joint pain  Hematologic:No history of hypercoagulable state.  No history of easy bleeding.  No history of anemia  Gastrointestinal: No hematochezia or melena,  No gastroesophageal reflux, no trouble swallowing  Urinary: '[ ]'$  chronic Kidney disease, [x ] on HD - '[ ]'$  MWF or '[ ]'$  TTHS, '[ ]'$  Burning with urination, '[ ]'$  Frequent urination, '[ ]'$  Difficulty urinating;   Skin: No  rashes  Psychological: No history of anxiety,  No history of depression  Social History Social History   Tobacco Use   Smoking status: Former    Types: Cigarettes    Quit date: 04/22/2003    Years since quitting: 19.1    Passive exposure: Never   Smokeless tobacco: Never  Substance Use Topics   Alcohol use: No    Comment: QUIT ALCOHOL 2004   Drug use: No    Family History No family history on file.  Allergies  Allergies  Allergen Reactions   Betaine    Lorazepam Other (See Comments)    delirium    Morphine And Related Nausea And Vomiting and Other (See Comments)    Hallucinations    Nsaids Other (See Comments)     Kidney disorder   Penicillins Nausea And Vomiting and Other (See Comments)    Hallucinations    Amphetamine-Dextroamphetamine Anxiety and Other (See Comments)    confusion      Current Outpatient Medications  Medication Sig Dispense Refill   acetaminophen (TYLENOL) 325 MG tablet Take 650 mg by mouth every 6 (six) hours as needed for moderate pain.     acyclovir (ZOVIRAX) 200 MG capsule Take 4 capsules (800 mg total) by mouth daily. 28 capsule 0   albuterol (PROVENTIL) (2.5 MG/3ML) 0.083% nebulizer solution Take 2.5 mg by nebulization every 6 (six) hours as needed for wheezing or shortness of breath.     Alpha-D-Galactosidase (BEANO PO) Take 1 tablet by mouth daily as needed (gas/bloating).     amLODipine (NORVASC) 5 MG tablet Take 5 mg by mouth daily. Hold for systolic bp less than 992.     aspirin EC 81 MG tablet Take 81 mg by mouth daily.     beta carotene w/minerals (OCUVITE) tablet Take 1 tablet by mouth daily.     Calcium Carb-Cholecalciferol (CALCIUM 600 + D PO) Take 1 tablet by mouth daily with lunch.     carvedilol (COREG) 25 MG tablet Take 25 mg by mouth daily. Take 1 tablet (25 mg) by mouth in the evening on Tuesdays, Thursdays & Saturdays ( dialysis days)     ceFEPIme 1 g in sodium chloride 0.9 % 100 mL Inject 1 g into the vein daily.      doxycycline (VIBRA-TABS) 100 MG tablet Take 1 tablet (100 mg total) by mouth 2 (two) times daily. 60 tablet 0   famotidine (PEPCID) 20 MG tablet Take 20 mg by mouth in the morning.     Ferrous Gluconate 324 (37.5 Fe) MG TABS Take 324 mg by mouth every evening.     gabapentin (NEURONTIN) 300 MG capsule Take 300 mg by mouth at bedtime.     Insulin Aspart (NOVOLOG FLEXPEN [place]) Inject 0-5 Units into the skin See admin instructions. 3 times daily per sliding scales < = 0 units 201-250=1 unit 251-300 = 2 units 301-350=  units 351-400= 4 units >400 5 units     lanthanum (FOSRENOL) 500 MG chewable tablet Chew 3,000 mg by mouth 2 (two) times daily.  lidocaine-prilocaine (EMLA) cream SMARTSIG:sparingly Topical     midodrine (PROAMATINE) 5 MG tablet Take 5 mg by mouth See admin instructions. Takes 1 tablet by mouth only on dialysis days if systolic blood pressure less than 150.     Multiple Vitamin (MULTIVITAMIN WITH MINERALS) TABS tablet Take 1 tablet by mouth daily.     potassium chloride SA (K-DUR,KLOR-CON) 20 MEQ tablet Take 1 tablet (20 mEq total) by mouth daily. 5 tablet 0   pravastatin (PRAVACHOL) 40 MG tablet Take 20 mg by mouth every evening.     predniSONE (DELTASONE) 5 MG tablet Take 5 mg by mouth daily.     senna (SENOKOT) 8.6 MG TABS tablet Take 1 tablet by mouth daily as needed for mild constipation.     sulfamethoxazole-trimethoprim (BACTRIM,SEPTRA) 400-80 MG per tablet Take 1 tablet by mouth every evening. continuous     tacrolimus (PROGRAF) 0.5 MG capsule Take 0.5 mg by mouth 2 (two) times daily.     traMADol (ULTRAM) 50 MG tablet Take 50 mg by mouth 2 (two) times daily as needed for severe pain.     No current facility-administered medications for this visit.    Physical Examination  Vitals:   06/20/22 1241  BP: (!) 157/67  Pulse: 80  Resp: 20  Temp: 98.1 F (36.7 C)  TempSrc: Temporal  SpO2: 97%  Height: '5\' 8"'$  (1.727 m)    Body mass index is 16.59 kg/m.  General:   Alert and oriented, no acute distress HEENT: Normal Neck: No bruit or JVD Pulmonary: Clear to auscultation bilaterally Cardiac: Regular Rate and Rhythm without murmur Abdomen: Soft, non-tender, non-distended, no mass, no scars Skin: No rash   Extremity Pulses: no ischemic changes Musculoskeletal: No deformity or edema  Neurologic: Upper and lower extremity motor 5/5 and symmetric  DATA:     Right Graft #1: femoropopliteal  +------------------+--------+--------+--------+--------+                    PSV cm/sStenosisWaveformComments  +------------------+--------+--------+--------+--------+  Inflow            102             biphasic          +------------------+--------+--------+--------+--------+  Prox Anastomosis  107             biphasic          +------------------+--------+--------+--------+--------+  Proximal Graft    70              biphasic          +------------------+--------+--------+--------+--------+  Mid Graft         69              biphasic          +------------------+--------+--------+--------+--------+  Distal Graft      58              biphasic          +------------------+--------+--------+--------+--------+  Distal Anastomosis80              biphasic          +------------------+--------+--------+--------+--------+  Outflow           119             biphasic          +------------------+--------+--------+--------+--------+     Summary:  Right: Widely patent bypass graft       ABI Findings:  +---------+------------------+-----+--------+----------------+  Right    Rt Pressure (mmHg)IndexWaveformComment           +---------+------------------+-----+--------+----------------+  Brachial 169                                              +---------+------------------+-----+--------+----------------+  PTA                             biphasicnon-compressible   +---------+------------------+-----+--------+----------------+  DP                              biphasicnon-compressible  +---------+------------------+-----+--------+----------------+  Great Toe62                0.37                           +---------+------------------+-----+--------+----------------+   +---------+------------------+-----+----------+----------------+  Left     Lt Pressure (mmHg)IndexWaveform  Comment           +---------+------------------+-----+----------+----------------+  Brachial                                  AVF               +---------+------------------+-----+----------+----------------+  PTA                             monophasicnon-compressible  +---------+------------------+-----+----------+----------------+  DP                              biphasic  non-compressible  +---------+------------------+-----+----------+----------------+  Great Toe58                0.34                             +---------+------------------+-----+----------+----------------+   +-------+-----------+-----------+------------+------------+  ABI/TBIToday's ABIToday's TBIPrevious ABIPrevious TBI  +-------+-----------+-----------+------------+------------+  Right  [place]         0.37       [place]          0.24          +-------+-----------+-----------+------------+------------+  Left   [place]         0.34                 0.56          +-------+-----------+-----------+------------+------------+      Summary:  Right: Resting right ankle-brachial index indicates noncompressible right  lower extremity arteries. The right toe-brachial index is abnormal.   Left: Resting left ankle-brachial index indicates noncompressible left  lower extremity arteries. The left toe-brachial index is abnormal.   ASSESSMENT:  PAD with history of third toe non healing wound. He is s/p right fem-below-knee popliteal artery bypass graft with 6 mm  external ring propatent PTFE.  Dr. Sharol Given performed the toe amputation.   The amputation has healed well.    His bypass duplex demonstrates patent bypass with biphasic flow.  The ABI are non compressible which demonstrates calcified vessels.  He has fully healed the third toe amputation.     PLAN: Activity as tolerates.  I advised him to stay as active as possible to maintain arterial inflow.  He stated he would try to increase his activity.  He will f/u in 9 months for repeat studies.  If he has problems or concerns he will call our office.   He will f/u PRN for ESRD.  Roxy Horseman PA-C [facility] [place] Office: [phone]  MD in clinic [place]

## 2022-06-28 ENCOUNTER — Other Ambulatory Visit: Payer: Self-pay

## 2022-06-28 DIAGNOSIS — M25562 Pain in left knee: Secondary | ICD-10-CM

## 2022-06-28 DIAGNOSIS — I7025 Atherosclerosis of native arteries of other extremities with ulceration: Secondary | ICD-10-CM

## 2022-07-19 ENCOUNTER — Ambulatory Visit (INDEPENDENT_AMBULATORY_CARE_PROVIDER_SITE_OTHER): Payer: Medicare Other | Admitting: Podiatry

## 2022-07-19 DIAGNOSIS — M79674 Pain in right toe(s): Secondary | ICD-10-CM | POA: Diagnosis not present

## 2022-07-19 DIAGNOSIS — M79675 Pain in left toe(s): Secondary | ICD-10-CM

## 2022-07-19 DIAGNOSIS — B351 Tinea unguium: Secondary | ICD-10-CM | POA: Diagnosis not present

## 2022-07-19 DIAGNOSIS — I739 Peripheral vascular disease, unspecified: Secondary | ICD-10-CM | POA: Diagnosis not present

## 2022-07-19 NOTE — Progress Notes (Signed)
  Subjective:  Patient ID: Tyler Mueller, male    DOB: 1941-10-14,  MRN: [id number]  Chief Complaint  Patient presents with   Foot Problem    [facility] BS 203 this afternoon.     81 y.o. male presents with the above complaint. History confirmed with patient.  He was referred following a right third toe amputation that developed after cutting part of the nail and developed infection  Objective:  Physical Exam: warm, good capillary refill, DP reduced bilateral, no trophic changes or ulcerative lesions, normal sensory exam, and PT reduced bilateral.  Well-healed surgical incision. Left Foot: dystrophic yellowed discolored nail plates with subungual debris Right Foot: dystrophic yellowed discolored nail plates with subungual debris    Assessment:   1. Pain due to onychomycosis of toenails of both feet   2. PVD (peripheral vascular disease) ([place]) s/p femoral-popliteal bypass surgery      Plan:  Patient was evaluated and treated and all questions answered.  Patient educated on diabetes. Discussed proper diabetic foot care and discussed risks and complications of disease. Educated patient in depth on reasons to return to the office immediately should he/she discover anything concerning or new on the feet. All questions answered. Discussed proper shoes as well.   Discussed the etiology and treatment options for the condition in detail with the patient. Educated patient on the topical and oral treatment options for mycotic nails. Recommended debridement of the nails today. Sharp and mechanical debridement performed of all painful and mycotic nails today. Nails debrided in length and thickness using a nail nipper to level of comfort. Discussed treatment options including appropriate shoe gear. Follow up as needed for painful nails.    Return in about 3 months (around 10/19/2022) for at risk diabetic foot care.

## 2022-10-24 ENCOUNTER — Encounter: Payer: Self-pay | Admitting: Podiatry

## 2022-10-24 ENCOUNTER — Ambulatory Visit (INDEPENDENT_AMBULATORY_CARE_PROVIDER_SITE_OTHER): Payer: Medicare Other | Admitting: Podiatry

## 2022-10-24 DIAGNOSIS — I739 Peripheral vascular disease, unspecified: Secondary | ICD-10-CM | POA: Diagnosis not present

## 2022-10-24 DIAGNOSIS — M79674 Pain in right toe(s): Secondary | ICD-10-CM | POA: Diagnosis not present

## 2022-10-24 DIAGNOSIS — M79675 Pain in left toe(s): Secondary | ICD-10-CM | POA: Diagnosis not present

## 2022-10-24 DIAGNOSIS — B351 Tinea unguium: Secondary | ICD-10-CM | POA: Diagnosis not present

## 2022-10-24 NOTE — Progress Notes (Signed)
This patient returns to my office for at risk foot care.  This patient requires this care by a professional since this patient will be at risk due to having PVD and amputation lesser toe right foot.  This patient is unable to cut nails himself since the patient cannot reach his nails.These nails are painful walking and wearing shoes.  This patient presents for at risk foot care today.  General Appearance  Alert, conversant and in no acute stress.  Vascular  Dorsalis pedis and posterior tibial  pulses are  weakly palpable  bilaterally.  Capillary return is within normal limits  bilaterally. Temperature is within normal limits  bilaterally.  Neurologic  Senn-Weinstein monofilament wire test within normal limits  bilaterally. Muscle power within normal limits bilaterally.  Nails Thick disfigured discolored nails with subungual debris  from hallux to fifth toes bilaterally. No evidence of bacterial infection or drainage bilaterally.  Orthopedic  No limitations of motion  feet .  No crepitus or effusions noted.  No bony pathology or digital deformities noted. Amputation digit right foot.  Skin  normotropic skin with no porokeratosis noted bilaterally.  No signs of infections or ulcers noted.     Onychomycosis  Pain in right toes  Pain in left toes  Consent was obtained for treatment procedures.   Mechanical debridement of nails 1-5  bilaterally performed with a nail nipper.  Filed with dremel without incident.    Return office visit   10 weeks                   Told patient to return for periodic foot care and evaluation due to potential at risk complications.   Gardiner Barefoot DPM

## 2023-01-02 ENCOUNTER — Ambulatory Visit: Payer: Medicare Other | Admitting: Podiatry

## 2023-01-11 ENCOUNTER — Encounter: Payer: Self-pay | Admitting: Podiatry

## 2023-01-11 ENCOUNTER — Ambulatory Visit (INDEPENDENT_AMBULATORY_CARE_PROVIDER_SITE_OTHER): Payer: Medicare Other | Admitting: Podiatry

## 2023-01-11 DIAGNOSIS — M79674 Pain in right toe(s): Secondary | ICD-10-CM

## 2023-01-11 DIAGNOSIS — B351 Tinea unguium: Secondary | ICD-10-CM | POA: Diagnosis not present

## 2023-01-11 DIAGNOSIS — M79675 Pain in left toe(s): Secondary | ICD-10-CM

## 2023-01-11 DIAGNOSIS — I739 Peripheral vascular disease, unspecified: Secondary | ICD-10-CM

## 2023-01-11 NOTE — Progress Notes (Signed)
This patient returns to my office for at risk foot care.  This patient requires this care by a professional since this patient will be at risk due to having PVD and amputation lesser toe right foot.  This patient is unable to cut nails himself since the patient cannot reach his nails.These nails are painful walking and wearing shoes.  This patient presents for at risk foot care today.  General Appearance  Alert, conversant and in no acute stress.  Vascular  Dorsalis pedis and posterior tibial  pulses are  weakly palpable  bilaterally.  Capillary return is within normal limits  bilaterally. Temperature is within normal limits  bilaterally.  Neurologic  Senn-Weinstein monofilament wire test within normal limits  bilaterally. Muscle power within normal limits bilaterally.  Nails Thick disfigured discolored nails with subungual debris  from hallux to fifth toes bilaterally. No evidence of bacterial infection or drainage bilaterally.  Orthopedic  No limitations of motion  feet .  No crepitus or effusions noted.  No bony pathology or digital deformities noted. Amputation digit right foot.  Skin  normotropic skin with no porokeratosis noted bilaterally.  No signs of infections or ulcers noted.     Onychomycosis  Pain in right toes  Pain in left toes  Consent was obtained for treatment procedures.   Mechanical debridement of nails 1-5  bilaterally performed with a nail nipper.  Filed with dremel without incident.    Return office visit   12 weeks                   Told patient to return for periodic foot care and evaluation due to potential at risk complications.   Gardiner Barefoot DPM

## 2023-02-03 DEATH — deceased

## 2023-02-08 ENCOUNTER — Telehealth: Payer: Self-pay | Admitting: *Deleted

## 2023-02-08 NOTE — Telephone Encounter (Signed)
Wife called to let us know that the patient has passed away,and to cancel his upcoming appointment. Offered her my condolences.

## 2023-04-12 ENCOUNTER — Ambulatory Visit: Payer: Medicare Other | Admitting: Podiatry

## 2023-11-24 IMAGING — MR MR HEAD W/O CM
11 series · 48 of 48 positions shown · non-contrast
Comparison: Prior head CT examinations 04/28/2022 and earlier.
Brain MRI 06/10/2021.

CLINICAL DATA: Provided history: Mental status change, unknown
cause. Fever, altered mental status.

EXAM:
MRI HEAD WITHOUT CONTRAST
TECHNIQUE: Multiplanar, multiecho pulse sequences of the brain and surrounding
structures were obtained without intravenous contrast.

[Series 5: DWI · axial · 3.0mm · 0.88mm/px · z∈[-69,+84]mm · 10 of 104 slices shown (1 of 4)]
[im 1/104]
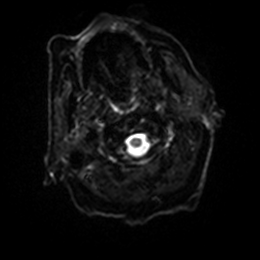
[im 12/104]
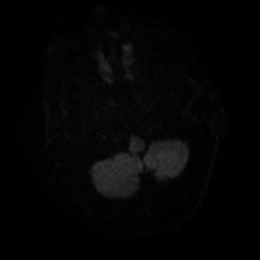
[im 23/104]
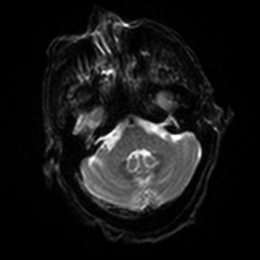
[im 35/104]
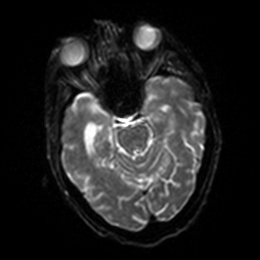
[im 46/104]
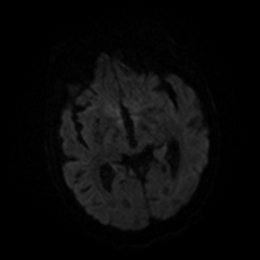
[im 58/104]
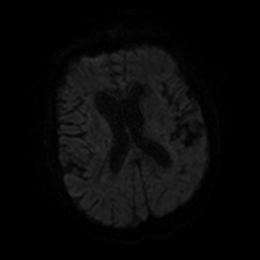
[im 69/104]
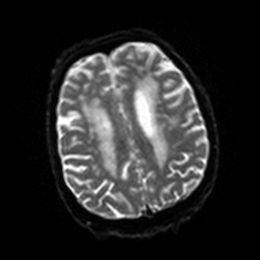
[im 81/104]
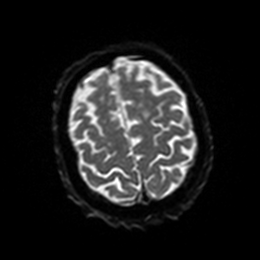
[im 92/104]
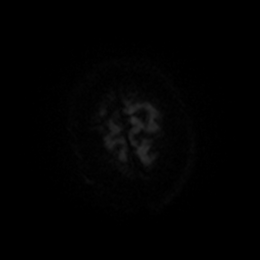
[im 104/104]
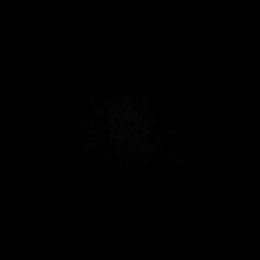

[Series 6: DWI · axial · 3.0mm · 0.88mm/px · z∈[-69,+84]mm · 5 of 52 slices shown (2 of 4)]
[im 1/52]
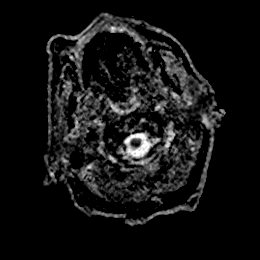
[im 13/52]
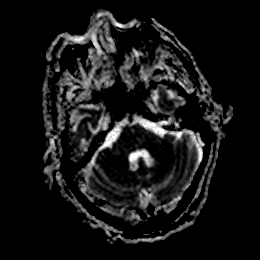
[im 26/52]
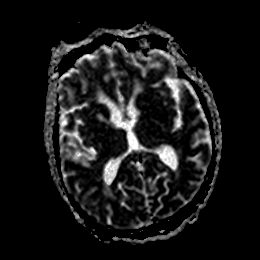
[im 39/52]
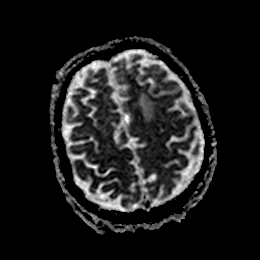
[im 52/52]
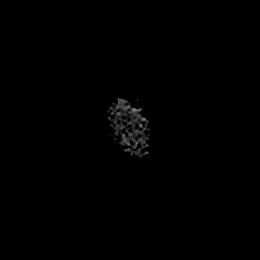

[Series 7: DWI · coronal · 4.0mm · 0.88mm/px · 6 of 64 slices shown (3 of 4)]
[im 1/64]
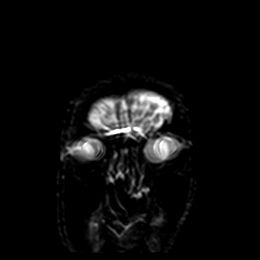
[im 13/64]
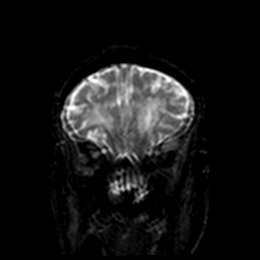
[im 26/64]
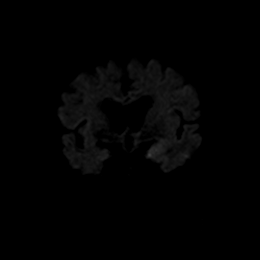
[im 38/64]
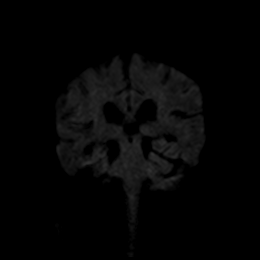
[im 51/64]
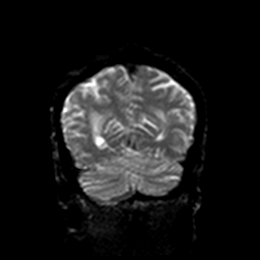
[im 64/64]
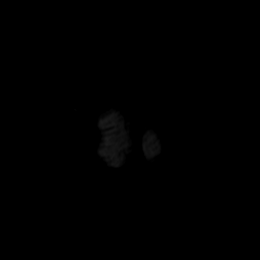

[Series 8: DWI · coronal · 4.0mm · 0.88mm/px · 3 of 32 slices shown (4 of 4)]
[im 1/32]
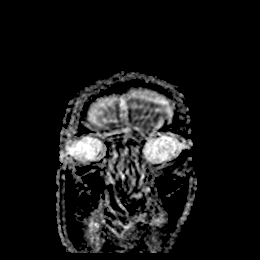
[im 16/32]
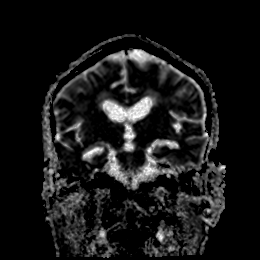
[im 32/32]
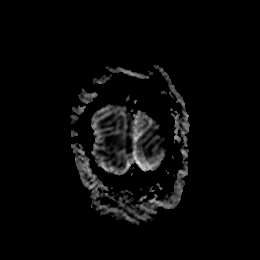

[Series 9: FLAIR · axial · 5.0mm · 0.90mm/px · z∈[-62,+82]mm · 2 of 25 slices shown]
[im 1/25]
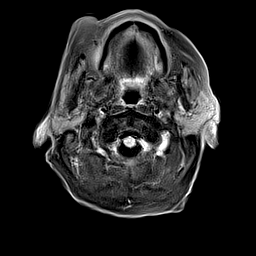
[im 25/25]
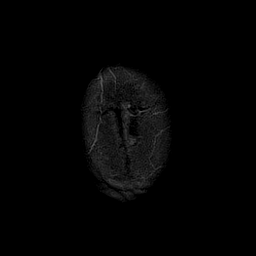

[Series 10: T2 · axial · 5.0mm · 0.72mm/px · z∈[-68,+88]mm · 2 of 27 slices shown]
[im 1/27]
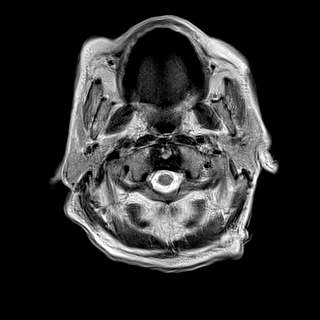
[im 27/27]
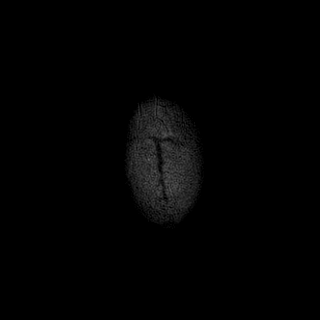

[Series 11: T1 · sagittal · 5.0mm · 0.75mm/px · 2 of 23 slices shown]
[im 1/23]
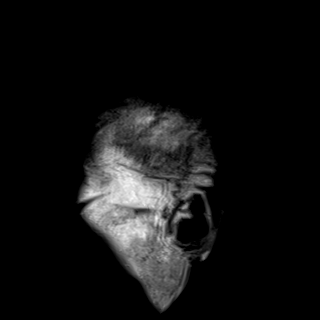
[im 23/23]
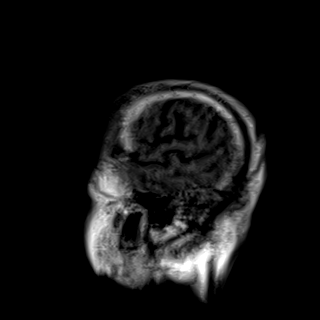

[Series 12: mag_images · axial · 3.0mm · 0.90mm/px · z∈[-77,+100]mm · 5 of 60 slices shown]
[im 1/60]
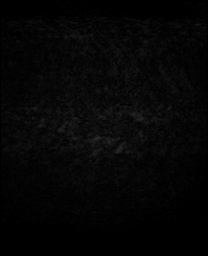
[im 15/60]
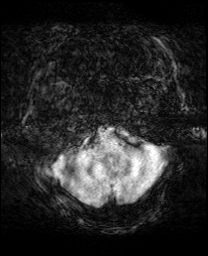
[im 30/60]
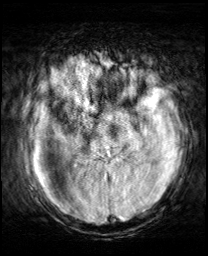
[im 45/60]
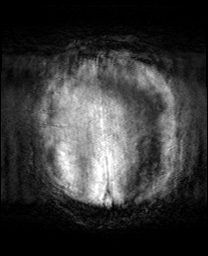
[im 60/60]
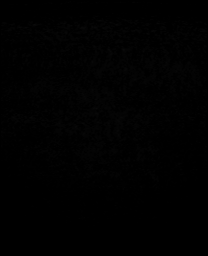

[Series 13: pha_images · axial · 3.0mm · 0.90mm/px · z∈[-53,+79]mm · 3 of 38 slices shown]
[im 1/38]
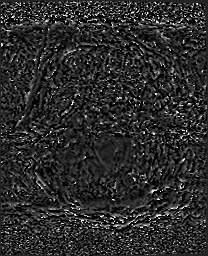
[im 19/38]
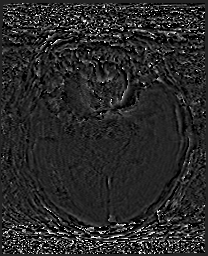
[im 38/38]
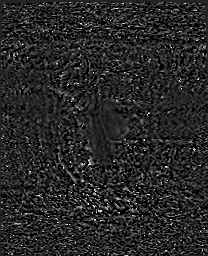

[Series 14: swi_images · axial · 3.0mm · 0.90mm/px · z∈[-77,+100]mm · 5 of 60 slices shown]
[im 1/60]
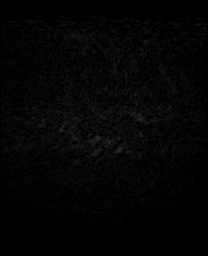
[im 15/60]
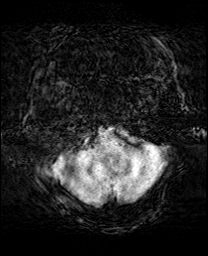
[im 30/60]
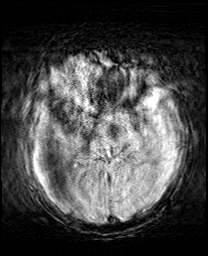
[im 45/60]
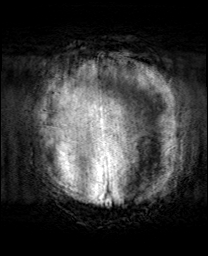
[im 60/60]
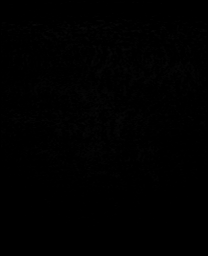

[Series 15: mip_images(sw) · axial · 24.0mm · 0.90mm/px · z∈[-66,+89]mm · 5 of 53 slices shown]
[im 1/53]
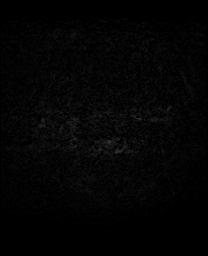
[im 14/53]
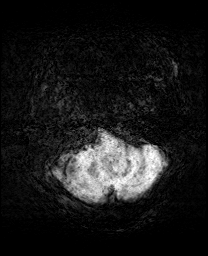
[im 27/53]
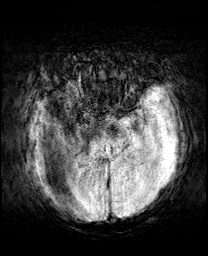
[im 40/53]
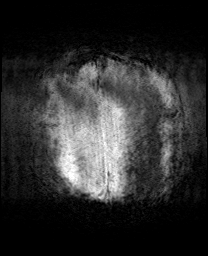
[im 53/53]
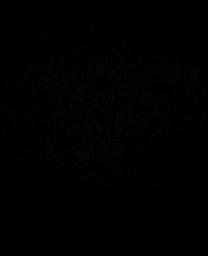

[48 of 48 positions shown; findings below may reference images not displayed]

FINDINGS: The examination is intermittently motion degraded, limiting
evaluation. Most notably, there is severe motion degradation of the
sagittal T1 weighted sequence, moderate motion degradation of the
axial diffusion-weighted sequence, mild-to-moderate motion
degradation of the coronal diffusion-weighted sequence, moderate
motion degradation of the axial T2 FLAIR sequence, and severe motion
degradation of the axial SWI sequence. Due to altered mental status
and excessive patient motion, the examination was terminated prior
to the acquisition of axial T1 weighted and coronal T2 TSE
sequences.

Brain:

Mild generalized cerebral atrophy.

Advanced patchy and confluent T2 FLAIR hyperintense signal
abnormality within the cerebral white matter, nonspecific but
compatible with chronic small vessel ischemic disease.

Redemonstrated chronic lacunar infarct within the right basal
ganglia/anterior limb of right internal capsule.

There is no evidence of acute infarct. No appreciable intracranial
mass or extra-axial fluid collection. Severe motion degradation of
the SWI sequence precludes evaluation for intracranial blood
products. No midline shift.

Vascular: Maintained flow voids within the proximal large arterial
vessels.

Skull and upper cervical spine: No focal suspicious marrow lesion.

Sinuses/Orbits: No mass or acute finding within the imaged orbits.
Minimal mucosal thickening within the bilateral ethmoid and left
maxillary sinuses.

Other: Small-volume fluid within the left mastoid air cells.
IMPRESSION: 1. Prematurely terminated and significantly motion degraded
examination, as described.
2. No evidence of acute intracranial abnormality.
3. Advanced chronic small vessel ischemic changes within the
cerebral white matter, similar to the prior brain MRI of 06/10/2021.
[DATE]. Redemonstrated chronic lacunar infarct within the right basal
ganglia/anterior limb of right internal capsule.
5. Mild for age generalized cerebral atrophy.
6. Small-volume fluid within the left mastoid air cells.

## 2023-11-25 IMAGING — CT CT ANGIO CHEST
2 of 6 series · 18 of 46 positions shown · IV contrast (agent unspecified)
Comparison: Chest radiographs dated 04/28/2022

CLINICAL DATA: Hypoxia

EXAM:
CT ANGIOGRAPHY CHEST WITH CONTRAST
TECHNIQUE: Multidetector CT imaging of the chest was performed using the
standard protocol during bolus administration of intravenous
contrast. Multiplanar CT image reconstructions and MIPs were
obtained to evaluate the vascular anatomy.

[Series 6: thins · axial · 0.71mm/px · z∈[+1226,+1502]mm · 15 of 304 slices shown]
[im 14/304  lung]
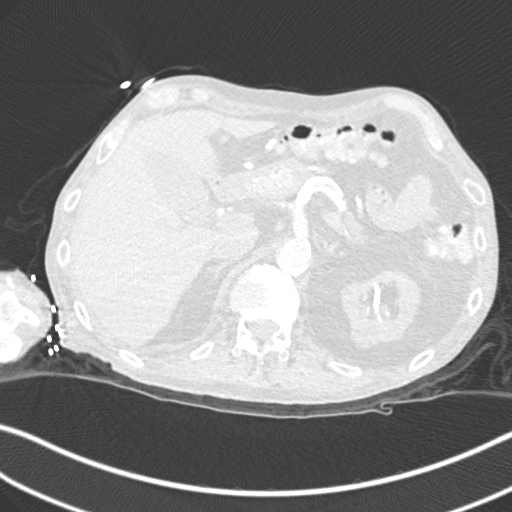
[im 40/304  soft-tissue]
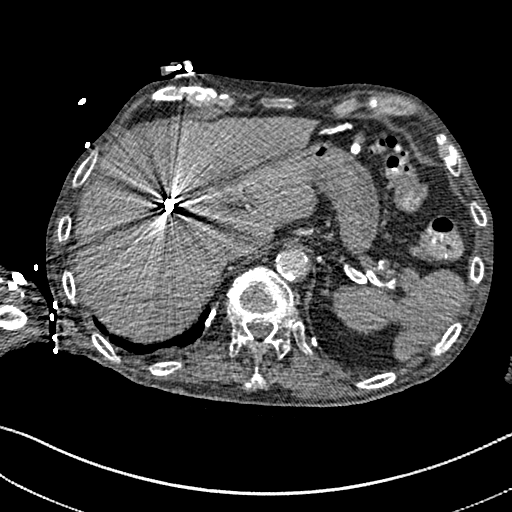
[im 53/304  lung]
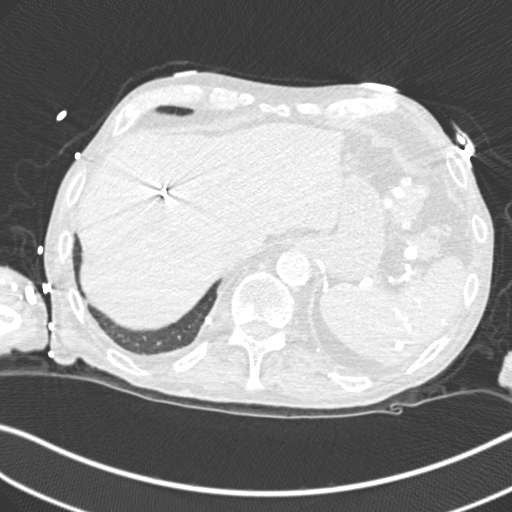
[im 80/304  soft-tissue]
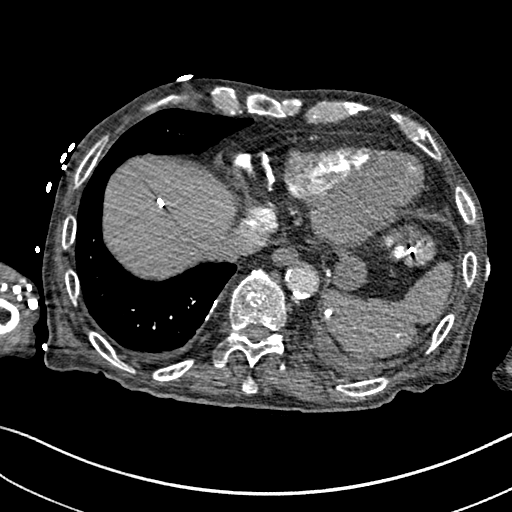
[im 93/304  lung]
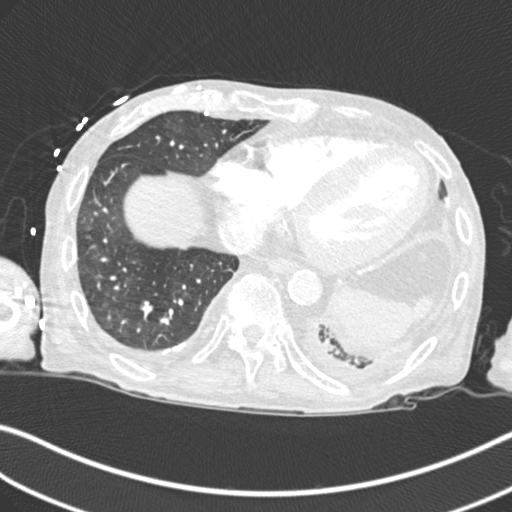
[im 119/304  soft-tissue]
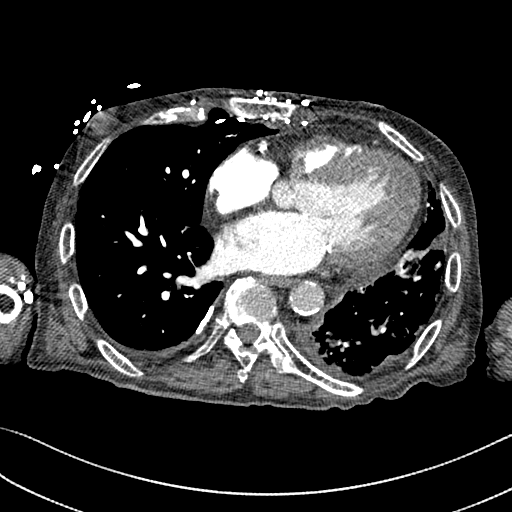
[im 132/304  lung]
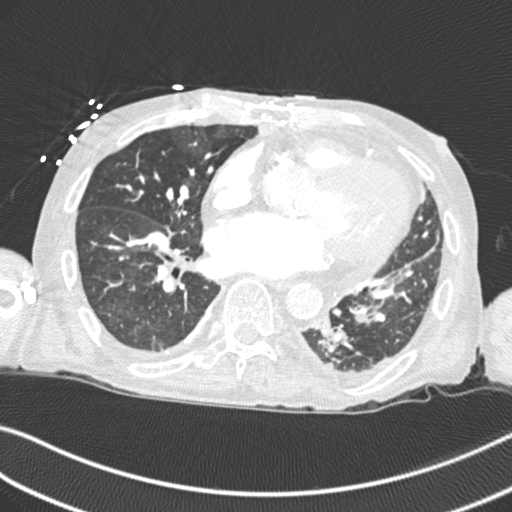
[im 159/304  soft-tissue]
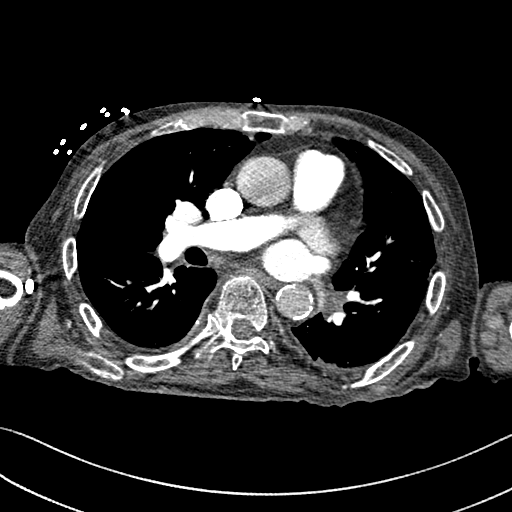
[im 172/304  lung]
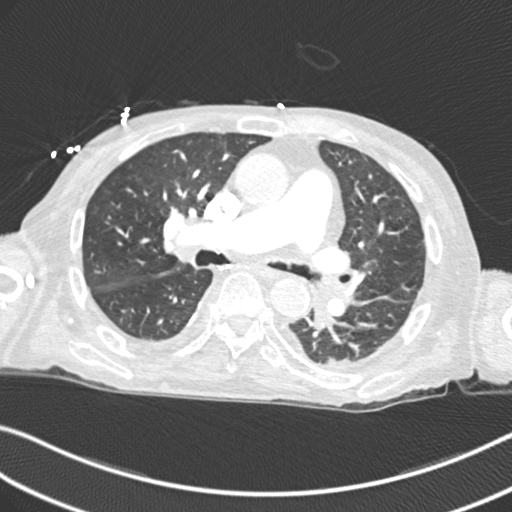
[im 185/304  soft-tissue]
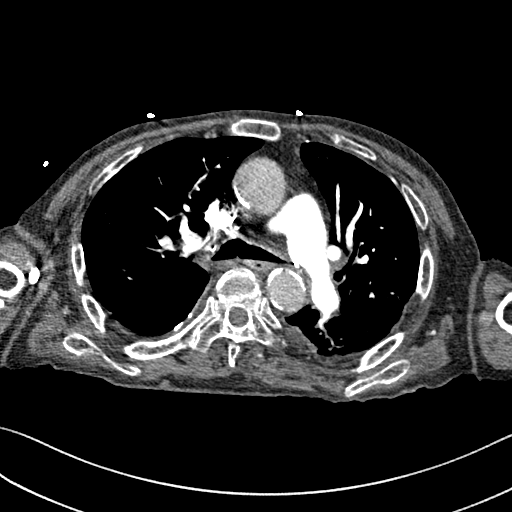
[im 211/304  lung]
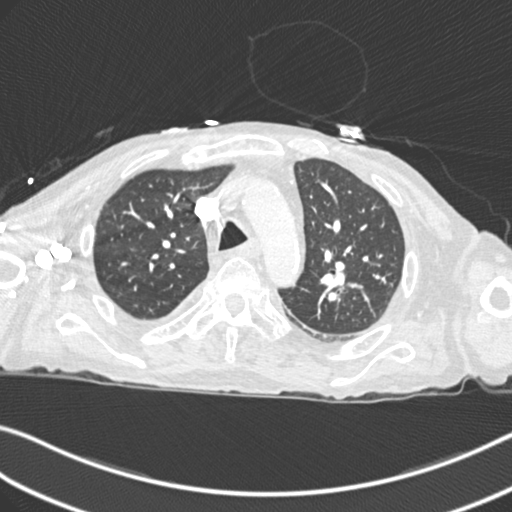
[im 224/304  soft-tissue]
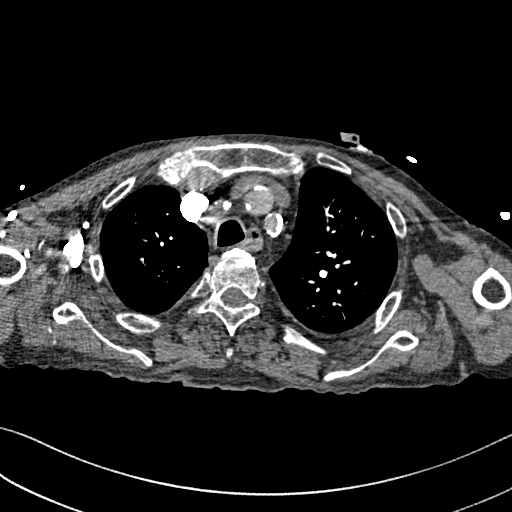
[im 251/304  lung]
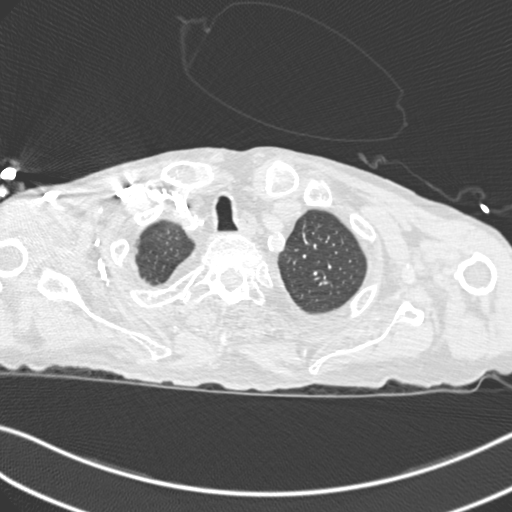
[im 264/304  soft-tissue]
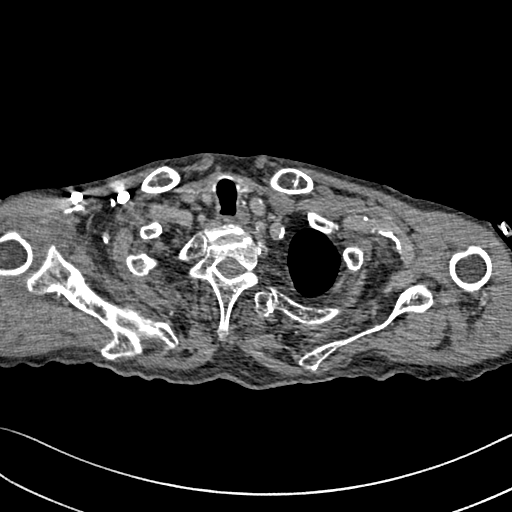
[im 290/304  lung]
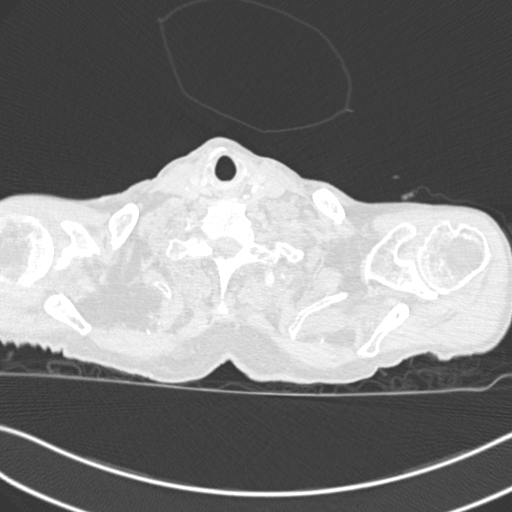

[Series 8: coronal mpr · coronal · 0.62mm/px · 3 of 116 slices shown]
[im 29/116  soft-tissue]
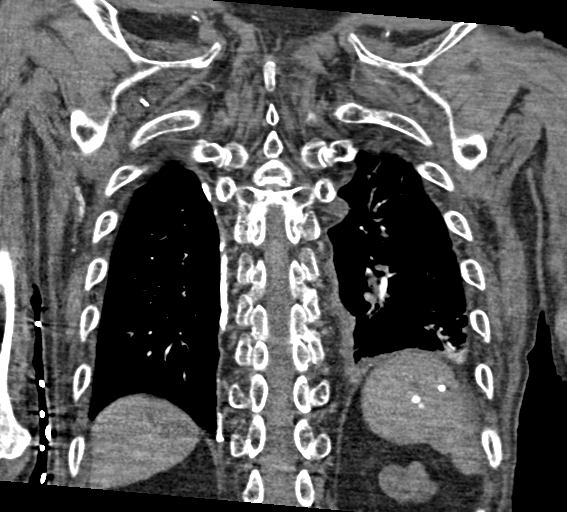
[im 58/116  soft-tissue]
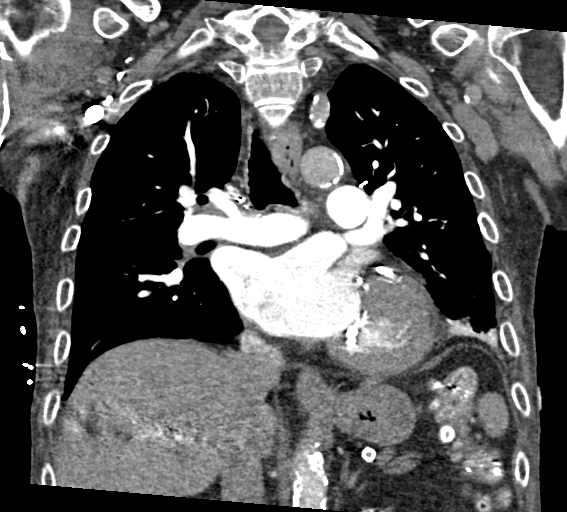
[im 87/116  soft-tissue]
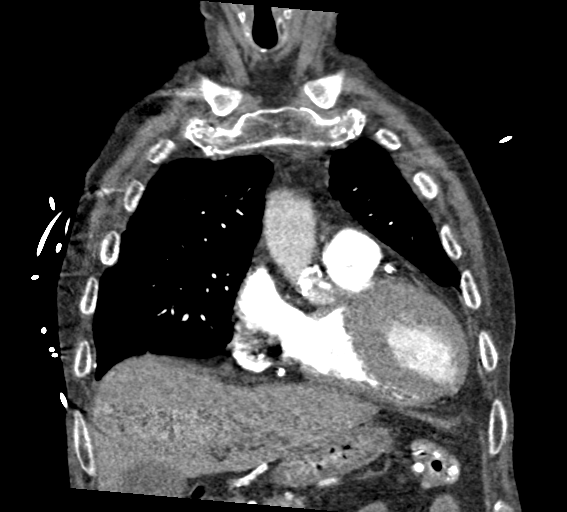

[18 of 46 positions shown; findings below may reference images not displayed]

RADIATION DOSE REDUCTION: This exam was performed according to the
departmental dose-optimization program which includes automated
exposure control, adjustment of the mA and/or kV according to
patient size and/or use of iterative reconstruction technique.

CONTRAST:  65mL OMNIPAQUE IOHEXOL 350 MG/ML SOLN
FINDINGS: Cardiovascular: Satisfactory opacification the bilateral pulmonary
arteries to the segmental level. No evidence pulmonary embolism.

Study is not tailored for evaluation of the thoracic aorta. No
evidence of thoracic aortic aneurysm. Atherosclerotic calcifications
of the arch.

Mild cardiomegaly.  No pericardial effusion.

Three vessel coronary sclerosis.

Mediastinum/Nodes: No suspicious mediastinal lymphadenopathy.

Visualized thyroid is unremarkable.

Lungs/Pleura: No frank interstitial edema.

No suspicious pulmonary nodules.

Mild lingular and left lower lobe atelectasis.

Trace bilateral pleural effusions, left greater than right.

No pneumothorax.

Upper Abdomen: Visualized upper abdomen is notable for postsurgical
changes in the liver, calcified splenic granulomata, extensive
colonic diverticulosis, and vascular calcifications.

Musculoskeletal: Median sternotomy. Mild to moderate compression
fracture deformities at T9 and T12, likely chronic.

Review of the MIP images confirms the above findings.
IMPRESSION: No evidence of pulmonary embolism.

Trace bilateral pleural effusions, left greater than right. No frank
interstitial edema.

Mild to moderate compression fracture deformities at T9 and T12,
likely chronic.

Aortic Atherosclerosis ([id number]).
